# Patient Record
Sex: Female | Born: 1965
Health system: Southern US, Community
[De-identification: ages and names within clinical notes are randomized; demographics above are authoritative.]

## PROBLEM LIST (undated history)

## (undated) DIAGNOSIS — R911 Solitary pulmonary nodule: Secondary | ICD-10-CM

## (undated) DIAGNOSIS — J45909 Unspecified asthma, uncomplicated: Secondary | ICD-10-CM

## (undated) DIAGNOSIS — H269 Unspecified cataract: Secondary | ICD-10-CM

## (undated) DIAGNOSIS — J439 Emphysema, unspecified: Secondary | ICD-10-CM

## (undated) DIAGNOSIS — J449 Chronic obstructive pulmonary disease, unspecified: Secondary | ICD-10-CM

## (undated) DIAGNOSIS — J8410 Pulmonary fibrosis, unspecified: Secondary | ICD-10-CM

## (undated) DIAGNOSIS — M1990 Unspecified osteoarthritis, unspecified site: Secondary | ICD-10-CM

## (undated) DIAGNOSIS — T7840XA Allergy, unspecified, initial encounter: Secondary | ICD-10-CM

## (undated) DIAGNOSIS — J302 Other seasonal allergic rhinitis: Secondary | ICD-10-CM

## (undated) DIAGNOSIS — G43909 Migraine, unspecified, not intractable, without status migrainosus: Secondary | ICD-10-CM

## (undated) DIAGNOSIS — K219 Gastro-esophageal reflux disease without esophagitis: Secondary | ICD-10-CM

## (undated) DIAGNOSIS — R0600 Dyspnea, unspecified: Secondary | ICD-10-CM

## (undated) DIAGNOSIS — C801 Malignant (primary) neoplasm, unspecified: Secondary | ICD-10-CM

## (undated) DIAGNOSIS — C3490 Malignant neoplasm of unspecified part of unspecified bronchus or lung: Secondary | ICD-10-CM

## (undated) HISTORY — PX: TUBAL LIGATION: SHX77

## (undated) HISTORY — DX: Allergy, unspecified, initial encounter: T78.40XA

## (undated) HISTORY — DX: Gastro-esophageal reflux disease without esophagitis: K21.9

## (undated) HISTORY — DX: Malignant neoplasm of unspecified part of unspecified bronchus or lung: C34.90

## (undated) HISTORY — DX: Unspecified cataract: H26.9

## (undated) HISTORY — DX: Emphysema, unspecified: J43.9

## (undated) HISTORY — DX: Other seasonal allergic rhinitis: J30.2

## (undated) HISTORY — DX: Unspecified osteoarthritis, unspecified site: M19.90

## (undated) HISTORY — DX: Unspecified asthma, uncomplicated: J45.909

## (undated) HISTORY — DX: Migraine, unspecified, not intractable, without status migrainosus: G43.909

---

## 2004-03-29 ENCOUNTER — Emergency Department: Payer: Self-pay | Admitting: Emergency Medicine

## 2004-05-11 ENCOUNTER — Other Ambulatory Visit: Payer: Self-pay

## 2004-05-11 ENCOUNTER — Emergency Department: Payer: Self-pay | Admitting: Emergency Medicine

## 2004-09-19 ENCOUNTER — Emergency Department: Payer: Self-pay | Admitting: Emergency Medicine

## 2004-10-15 ENCOUNTER — Emergency Department: Payer: Self-pay | Admitting: Emergency Medicine

## 2005-12-23 ENCOUNTER — Emergency Department: Payer: Self-pay | Admitting: Emergency Medicine

## 2006-10-01 ENCOUNTER — Emergency Department: Payer: Self-pay | Admitting: Emergency Medicine

## 2010-07-01 ENCOUNTER — Emergency Department: Payer: Self-pay | Admitting: Unknown Physician Specialty

## 2011-12-17 ENCOUNTER — Ambulatory Visit: Payer: Self-pay

## 2012-01-06 ENCOUNTER — Emergency Department: Payer: Self-pay | Admitting: Emergency Medicine

## 2012-01-26 ENCOUNTER — Ambulatory Visit (INDEPENDENT_AMBULATORY_CARE_PROVIDER_SITE_OTHER): Payer: Self-pay | Admitting: Pulmonary Disease

## 2012-01-26 ENCOUNTER — Encounter: Payer: Self-pay | Admitting: Pulmonary Disease

## 2012-01-26 VITALS — BP 104/70 | HR 79 | Temp 98.0°F | Ht 61.0 in | Wt 107.4 lb

## 2012-01-26 DIAGNOSIS — J8410 Pulmonary fibrosis, unspecified: Secondary | ICD-10-CM

## 2012-01-26 DIAGNOSIS — R059 Cough, unspecified: Secondary | ICD-10-CM

## 2012-01-26 DIAGNOSIS — Z720 Tobacco use: Secondary | ICD-10-CM | POA: Insufficient documentation

## 2012-01-26 DIAGNOSIS — R911 Solitary pulmonary nodule: Secondary | ICD-10-CM | POA: Insufficient documentation

## 2012-01-26 DIAGNOSIS — F17200 Nicotine dependence, unspecified, uncomplicated: Secondary | ICD-10-CM

## 2012-01-26 DIAGNOSIS — J4489 Other specified chronic obstructive pulmonary disease: Secondary | ICD-10-CM | POA: Insufficient documentation

## 2012-01-26 DIAGNOSIS — J449 Chronic obstructive pulmonary disease, unspecified: Secondary | ICD-10-CM | POA: Insufficient documentation

## 2012-01-26 DIAGNOSIS — R05 Cough: Secondary | ICD-10-CM

## 2012-01-26 MED ORDER — TIOTROPIUM BROMIDE MONOHYDRATE 18 MCG IN CAPS: 18.0000 ug | ORAL_CAPSULE | Freq: Every day | RESPIRATORY_TRACT | Status: DC

## 2012-01-26 MED ORDER — CICLESONIDE 160 MCG/ACT IN AERS: 1.0000 | INHALATION_SPRAY | Freq: Two times a day (BID) | RESPIRATORY_TRACT | Status: DC

## 2012-01-26 MED ORDER — BECLOMETHASONE DIPROPIONATE 80 MCG/ACT IN AERS: 1.0000 | INHALATION_SPRAY | Freq: Two times a day (BID) | RESPIRATORY_TRACT | Status: DC

## 2012-01-26 NOTE — Progress Notes (Signed)
Subjective:    Patient ID: Joy Ball, female    DOB: 01/31/1966, 46 y.o.   MRN: [id number]  HPI  Joy Ball is a very pleasant 46 year old female with a past medical history significant for asthma who comes to our clinic today to establish care. She states that she was diagnosed with asthma at age 14 when she was pregnant. She developed pneumonia and was transferred to [facility] where she delivered her baby and then was referred back to pulmonary medicine in [place] for further management. She saw Dr. Priscille Heidelberg for several years until he left his practice. Since then she has not been using inhalers consistently and did not have a primary care doctor until a few months ago. She states that over the last several years she has become progressively short of breath with exertion. She is able to work in her garden and do regular chores around her house however with heavy labor or hot weather she gets short of breath. She does get short of breath when she climbs a flight of stairs. In June of 2013 she had to go to the emergency room for shortness of breath where she was given prednisone and inhalers. She then saw a new primary care physician who prescribed Advair and albuterol. She does not think that the Advair has helped much as she still remained short of breath. She denies chest pain with or shortness of breath but states that she does often have sputum production. She quit smoking over one week ago.  Past Medical History  Diagnosis Date  . Seasonal allergies   . Migraines   . Asthma   . GERD (gastroesophageal reflux disease)      Family History  Problem Relation Age of Onset  . Lung cancer Father     was a smoker  . Heart disease Maternal Grandfather   . Emphysema Mother      History   Social History  . Marital Status: Married    Spouse Name: N/A    Number of Children: 3  . Years of Education: N/A   Occupational History  . Cleans houses Other   Social History Main Topics  .  Smoking status: Former Smoker -- 2.0 packs/day for 30 years    Types: Cigarettes    Quit date: 01/19/2012  . Smokeless tobacco: Never Used  . Alcohol Use: Yes     occ  . Drug Use: No  . Sexually Active: Not on file   Other Topics Concern  . Not on file   Social History Narrative  . No narrative on file     Allergies  Allergen Reactions  . Amoxicillin Hives     No outpatient prescriptions prior to visit.     Review of Systems  Constitutional: Negative for fever, chills and unexpected weight change.  HENT: Negative for ear pain, nosebleeds, congestion, sore throat, rhinorrhea, sneezing, trouble swallowing, dental problem, voice change, postnasal drip and sinus pressure.   Eyes: Negative for visual disturbance.  Respiratory: Positive for cough. Negative for choking and shortness of breath.   Cardiovascular: Negative for chest pain and leg swelling.  Gastrointestinal: Negative for vomiting, abdominal pain and diarrhea.  Genitourinary: Negative for difficulty urinating.  Musculoskeletal: Negative for arthralgias.  Skin: Negative for rash.  Neurological: Negative for tremors, syncope and headaches.  Hematological: Does not bruise/bleed easily.       Objective:   Physical Exam Filed Vitals:   01/26/12 1610  BP: 104/70  Pulse: 79  Temp: 98  F (36.7 C)  TempSrc: Oral  Height: 5\' 1"  (1.549 m)  Weight: 107 lb 6.4 oz (48.716 kg)  SpO2: 99%    Gen: well appearing, no acute distress HEENT: NCAT, PERRL, EOMi, OP clear, neck supple without masses PULM: Poor air movement bilaterally CV: RRR, no mgr, no JVD AB: BS+, soft, nontender, no hsm Ext: warm, no edema, no clubbing, no cyanosis Derm: no rash or skin breakdown Neuro: A&Ox4, CN II-XII intact, strength 5/5 in all 4 extremities  June 2013 chest x-ray from [facility] shows upper lobe emphysema a very small subcentimeter nodule in the left upper lobe  August 2013 simple spirometry normal ratio and normal FEV1 however there  was delay in the expiratory phase of her flow volume loop      Assessment & Plan:   Asthma with COPD Mrs. Lincoln's smoking history and dyspnea on exertion seen consistent with COPD. She has upper lobe emphysema noted on her chest x-ray from June 2013 which would also follow at this diagnosis. However she also describes asthma-like symptoms so it is likely that she has a component of both emphysema and asthma. I am reassured that her simple spirometry is essentially normal in terms of her percent predicted FEV1, however she did have some delay in her expiratory flow loop consistent with small airways disease. The small airways disease is likely due to her tobacco use.  Because her simple spirometry is normal it does raise concern that the fibrosis noted on the chest x-ray is clinically significant.  Plan: -Encouraged greatly to stay off cigarettes. -Stop Advair as she has not had benefit from this -Start Spiriva daily -Start Qvar daily as this is a small particle inhale corticosteroid and perhaps will give her greater benefit than the large particle inhale corticosteroid in Advair  Pulmonary fibrosis The fibrosis noted in the upper lobes on the June 2013 chest x-ray was minimal. However considering her significant shortness of breath think it is reasonable to pursue a chest CT to further characterize this.  Plan: -Obtain CT scan without contrast at [facility] to evaluate pulmonary fibrosis -If significant pulmonary fibrosis noted on that study did obtain a further occupational exposure history and lab work as needed.  Pulmonary nodule, left It is reassuring that this nodule is less than 1 cm, round, and solid. However considering the fact she is a smoker she is at higher risk for primary lung malignancy.  Plan: -CT scan of chest without contrast to further evaluate this lesion.  Tobacco abuse Encouraged to stay off cigarettes at length.   Updated Medication List Outpatient Encounter  Prescriptions as of 01/26/2012  Medication Sig Dispense Refill  . albuterol (VENTOLIN HFA) 108 (90 BASE) MCG/ACT inhaler Inhale 2 puffs into the lungs every 6 (six) hours as needed.      . beclomethasone (QVAR) 80 MCG/ACT inhaler Inhale 1 puff into the lungs 2 (two) times daily.  1 Inhaler  2  . tiotropium (SPIRIVA HANDIHALER) 18 MCG inhalation capsule Place 1 capsule (18 mcg total) into inhaler and inhale daily.  30 capsule  2  . DISCONTD: ciclesonide (ALVESCO) 160 MCG/ACT inhaler Inhale 1 puff into the lungs 2 (two) times daily.  1 Inhaler  3  . DISCONTD: Fluticasone-Salmeterol (ADVAIR DISKUS) 250-50 MCG/DOSE AEPB Inhale 1 puff into the lungs every 12 (twelve) hours.

## 2012-01-26 NOTE — Assessment & Plan Note (Signed)
The fibrosis noted in the upper lobes on the June 2013 chest x-ray was minimal. However considering her significant shortness of breath think it is reasonable to pursue a chest CT to further characterize this.  Plan: -Obtain CT scan without contrast at [facility] to evaluate pulmonary fibrosis -If significant pulmonary fibrosis noted on that study did obtain a further occupational exposure history and lab work as needed.

## 2012-01-26 NOTE — Patient Instructions (Addendum)
We will send you for a CT scan of you lungs to evaluate the scarring and lung nodule.  We will call you with the results of the test when we see it. Stay off cigarettes! Stop advair. Keep taking albuterol as you are doing. Start Spiriva one puff daily. Start QVar 1 puff twice a day. We will see you back in 2-3 months or sooner if needed.

## 2012-01-26 NOTE — Assessment & Plan Note (Signed)
Encouraged to stay off cigarettes at length.

## 2012-01-26 NOTE — Assessment & Plan Note (Signed)
Joy Ball's smoking history and dyspnea on exertion seen consistent with COPD. She has upper lobe emphysema noted on her chest x-ray from June 2013 which would also follow at this diagnosis. However she also describes asthma-like symptoms so it is likely that she has a component of both emphysema and asthma. I am reassured that her simple spirometry is essentially normal in terms of her percent predicted FEV1, however she did have some delay in her expiratory flow loop consistent with small airways disease. The small airways disease is likely due to her tobacco use.  Because her simple spirometry is normal it does raise concern that the fibrosis noted on the chest x-ray is clinically significant.  Plan: -Encouraged greatly to stay off cigarettes. -Stop Advair as she has not had benefit from this -Start Spiriva daily -Start Qvar daily as this is a small particle inhale corticosteroid and perhaps will give her greater benefit than the large particle inhale corticosteroid in Advair

## 2012-01-26 NOTE — Assessment & Plan Note (Signed)
It is reassuring that this nodule is less than 1 cm, round, and solid. However considering the fact she is a smoker she is at higher risk for primary lung malignancy.  Plan: -CT scan of chest without contrast to further evaluate this lesion.

## 2012-01-29 ENCOUNTER — Ambulatory Visit: Payer: Self-pay | Admitting: Pulmonary Disease

## 2012-02-02 ENCOUNTER — Telehealth: Payer: Self-pay | Admitting: Pulmonary Disease

## 2012-02-02 DIAGNOSIS — R911 Solitary pulmonary nodule: Secondary | ICD-10-CM

## 2012-02-02 MED ORDER — FLUTICASONE-SALMETEROL 250-50 MCG/DOSE IN AEPB: 1.0000 | INHALATION_SPRAY | Freq: Two times a day (BID) | RESPIRATORY_TRACT | Status: DC

## 2012-02-02 NOTE — Telephone Encounter (Signed)
I called Joy Ball to let her know that she had pulmonary nodule and a lot of emphysema with some basilar scarring.  She will need a 3 month follow up CT.  She also asked that we switch her off the spiriva/QVar combo because it is too expensive.  Change to Advair, Rx sent by me.

## 2012-02-02 NOTE — Telephone Encounter (Signed)
Order placed. Jessilyn Catino, CMA  

## 2012-05-04 ENCOUNTER — Encounter: Payer: Self-pay | Admitting: Pulmonary Disease

## 2012-05-04 ENCOUNTER — Ambulatory Visit: Payer: Self-pay | Admitting: Pulmonary Disease

## 2012-05-04 ENCOUNTER — Ambulatory Visit (INDEPENDENT_AMBULATORY_CARE_PROVIDER_SITE_OTHER): Payer: Self-pay | Admitting: Pulmonary Disease

## 2012-05-04 VITALS — BP 100/60 | HR 80 | Temp 98.0°F | Ht 61.0 in | Wt 111.8 lb

## 2012-05-04 DIAGNOSIS — Z720 Tobacco use: Secondary | ICD-10-CM

## 2012-05-04 DIAGNOSIS — F17200 Nicotine dependence, unspecified, uncomplicated: Secondary | ICD-10-CM

## 2012-05-04 DIAGNOSIS — J4489 Other specified chronic obstructive pulmonary disease: Secondary | ICD-10-CM

## 2012-05-04 DIAGNOSIS — R1310 Dysphagia, unspecified: Secondary | ICD-10-CM

## 2012-05-04 DIAGNOSIS — R911 Solitary pulmonary nodule: Secondary | ICD-10-CM

## 2012-05-04 DIAGNOSIS — J8410 Pulmonary fibrosis, unspecified: Secondary | ICD-10-CM

## 2012-05-04 DIAGNOSIS — J449 Chronic obstructive pulmonary disease, unspecified: Secondary | ICD-10-CM

## 2012-05-04 NOTE — Assessment & Plan Note (Signed)
Based on the review of her CT chest, it is clear that she has significant emphysema due to her tobacco use.  Plan: -advised to get a flu shot locally (cheaper from [facility] for her for insurance reasons) -obtain full PFT's -continue spiriva -continue albuterol as needed -stop smoking!

## 2012-05-04 NOTE — Progress Notes (Signed)
Subjective:    Patient ID: Joy Ball, female    DOB: July 24, 1965, 46 y.o.   MRN: [id number]  Synopsis:  Joy Ball was referred to the [facility] Pulmonary clinic in [facility] in 01/2012 for a pulmonary nodule.  She has a > 25 pack year smoking history.  A CT scan in 01/2012 showed multiple sub centimeter nodules and one area of scarring in the right lung apex 1.5cmx1.7cm.  She also had significant emphysema in the upper lobes and GGO/micronodular scarring in the R lung base.  HPI  05/04/2012 Unfortunately Joy Ball is still smoking.  She quit smoking for several weeks since our last visit and her cough improved greatly but she has still been smoking more lately.  She liked the spiriva but stopped taking it due to cost.  She gets short of breath when she walks up stairs, but does OK on level ground.  She sometimes feels that food gets stuck in her esophagus (3-4 times a month). She does not have an unexplained rash, joint aches, or weight loss.  Past Medical History  Diagnosis Date  . Seasonal allergies   . Migraines   . Asthma   . GERD (gastroesophageal reflux disease)      Review of Systems  Constitutional: Negative for fever, chills, fatigue and unexpected weight change.  HENT: Positive for congestion, rhinorrhea and postnasal drip. Negative for nosebleeds.   Respiratory: Positive for cough and shortness of breath. Negative for chest tightness.        Objective:   Physical Exam  Filed Vitals:   05/04/12 1522  BP: 100/60  Pulse: 80  Temp: 98 F (36.7 C)  TempSrc: Oral  Height: 5\' 1"  (1.549 m)  Weight: 111 lb 12.8 oz (50.712 kg)  SpO2: 98%   Gen: well appearing, no acute distress HEENT: NCAT, PERRL, EOMi, OP clear, neck supple without masses PULM: Crackles R base CV: RRR, no mgr, no JVD AB: BS+, soft, nontender, no hsm Ext: warm, no edema, no clubbing, no cyanosis      Assessment & Plan:   Asthma with COPD Based on the review of her CT chest, it is clear that she has  significant emphysema due to her tobacco use.  Plan: -advised to get a flu shot locally (cheaper from [facility] for her for insurance reasons) -obtain full PFT's -continue spiriva -continue albuterol as needed -stop smoking!  Pulmonary fibrosis There is focal ground glass and micronodular opacity in the RLL on her CT scans.  This has not changed and really doesn't seem to limit her much from a functional standpoint.  She has little evidence on history or exam to suggest a connective tissue disease.  I worry about aspiration given the fact that food sometimes gets stuck in her esophagus and the opacities are located in the RLL.  Plan: - today -full PFT at [facility] -serologic work up at [facility] -barium swallow at [facility]  Pulmonary nodule, left Fortunately there is no change noted on Joy Ball's CT scan performed today.  She really needs to quit smoking.  Plan: -repeat CT in 6 months -quit smoking!  Tobacco abuse We discussed at length today.  Chantix gave her a headache in years prior.  She has not tried nicotine patches.  She will try this (she has at home). I advised to use the 7 mcg patches.   Updated Medication List Outpatient Encounter Prescriptions as of 05/04/2012  Medication Sig Dispense Refill  . albuterol (VENTOLIN HFA) 108 (90 BASE) MCG/ACT inhaler Inhale 2 puffs into  the lungs every 6 (six) hours as needed.      . [DISCONTINUED] Fluticasone-Salmeterol (ADVAIR DISKUS) 250-50 MCG/DOSE AEPB Inhale 1 puff into the lungs 2 (two) times daily.  1 each  5

## 2012-05-04 NOTE — Assessment & Plan Note (Signed)
We discussed at length today.  Chantix gave her a headache in years prior.  She has not tried nicotine patches.  She will try this (she has at home). I advised to use the 7 mcg patches.

## 2012-05-04 NOTE — Assessment & Plan Note (Signed)
There is focal ground glass and micronodular opacity in the RLL on her CT scans.  This has not changed and really doesn't seem to limit her much from a functional standpoint.  She has little evidence on history or exam to suggest a connective tissue disease.  I worry about aspiration given the fact that food sometimes gets stuck in her esophagus and the opacities are located in the RLL.  Plan: - today -full PFT at [facility] -serologic work up at [facility] -barium swallow at [facility]

## 2012-05-04 NOTE — Patient Instructions (Addendum)
We will send you for lab work, pulmonary function testing and a barium swallow at [facility].  Use the nicotine patches to quit smoking.    Keep using the spiriva  Get a flu shot from [facility] or a local pharmacy  We will see you back in December 2013

## 2012-05-04 NOTE — Assessment & Plan Note (Signed)
Fortunately there is no change noted on Joy Ball's CT scan performed today.  She really needs to quit smoking.  Plan: -repeat CT in 6 months -quit smoking!

## 2012-05-11 ENCOUNTER — Encounter: Payer: Self-pay | Admitting: Pulmonary Disease

## 2012-05-11 ENCOUNTER — Ambulatory Visit: Payer: Self-pay | Admitting: Pulmonary Disease

## 2012-05-11 LAB — PULMONARY FUNCTION TEST

## 2012-05-18 ENCOUNTER — Telehealth: Payer: Self-pay | Admitting: *Deleted

## 2012-05-18 ENCOUNTER — Encounter: Payer: Self-pay | Admitting: Pulmonary Disease

## 2012-05-18 NOTE — Telephone Encounter (Signed)
Spoke with pt and notified of results per Dr.McQuaid. Pt verbalized understanding and denied any questions. 

## 2012-05-18 NOTE — Telephone Encounter (Signed)
Message copied by Christen Butter on Tue May 18, 2012 10:20 AM ------      Message from: Max Fickle B      Created: Tue May 18, 2012 10:08 AM       L,            Please let her know that her barium swallow and all her labwork was normal            Thanks      B

## 2012-05-18 NOTE — Telephone Encounter (Signed)
Tried calling the pt- LMTCB. 

## 2012-06-01 ENCOUNTER — Ambulatory Visit: Payer: Self-pay | Admitting: Pulmonary Disease

## 2012-06-11 ENCOUNTER — Encounter: Payer: Self-pay | Admitting: Pulmonary Disease

## 2012-06-26 ENCOUNTER — Emergency Department: Payer: Self-pay | Admitting: Emergency Medicine

## 2013-04-01 ENCOUNTER — Ambulatory Visit: Payer: Self-pay | Admitting: Emergency Medicine

## 2013-04-28 ENCOUNTER — Other Ambulatory Visit: Payer: Self-pay

## 2013-06-27 ENCOUNTER — Ambulatory Visit: Payer: Self-pay | Admitting: Physician Assistant

## 2013-08-28 ENCOUNTER — Ambulatory Visit: Payer: Self-pay | Admitting: Physician Assistant

## 2013-09-05 ENCOUNTER — Ambulatory Visit: Payer: Self-pay | Admitting: Physician Assistant

## 2014-07-18 ENCOUNTER — Ambulatory Visit: Payer: Self-pay | Admitting: Gastroenterology

## 2014-07-27 ENCOUNTER — Encounter: Payer: Self-pay | Admitting: Pulmonary Disease

## 2014-07-27 ENCOUNTER — Ambulatory Visit: Payer: Self-pay | Admitting: Gastroenterology

## 2014-07-27 ENCOUNTER — Ambulatory Visit (INDEPENDENT_AMBULATORY_CARE_PROVIDER_SITE_OTHER): Payer: 59 | Admitting: Pulmonary Disease

## 2014-07-27 VITALS — BP 112/70 | HR 91 | Ht 61.0 in | Wt 90.8 lb

## 2014-07-27 DIAGNOSIS — R911 Solitary pulmonary nodule: Secondary | ICD-10-CM

## 2014-07-27 DIAGNOSIS — Z720 Tobacco use: Secondary | ICD-10-CM

## 2014-07-27 DIAGNOSIS — J449 Chronic obstructive pulmonary disease, unspecified: Secondary | ICD-10-CM

## 2014-07-27 DIAGNOSIS — J8410 Pulmonary fibrosis, unspecified: Secondary | ICD-10-CM

## 2014-07-27 MED ORDER — BENZONATATE 200 MG PO CAPS: 200.0000 mg | ORAL_CAPSULE | Freq: Three times a day (TID) | ORAL | Status: DC | PRN

## 2014-07-27 MED ORDER — FLUTICASONE-SALMETEROL 250-50 MCG/DOSE IN AEPB: 1.0000 | INHALATION_SPRAY | Freq: Two times a day (BID) | RESPIRATORY_TRACT | Status: AC

## 2014-07-27 NOTE — Assessment & Plan Note (Signed)
We will repeat a CT chest to evaluate the pulmonary nodule as well as the scarring in the right upper lung. Unfortunately she lost her insurance and she did not undergo any of the standard surveillance images in the last 2 years.

## 2014-07-27 NOTE — Assessment & Plan Note (Addendum)
I am very worried about Joy Ball. She continues to smoke 1-1/2 packs of cigarettes daily and remain symptomatic from her COPD with asthmatic features. Specifically, the main reason why I'm concerned about her is that her mother has severe emphysema and pulmonary fibrosis and will likely die in the next 6 months from a similar syndrome. I worry that Joy Ball is also developing early changes of fibrosis in addition to her already severe emphysema.  Fortunately today she did not have hypoxemia on exertion.  Plan: -Check alpha-1 antitrypsin level next visit -Resume Advair twice a day -Obtain full pulmonary function testing -See below -Educated at length to quit

## 2014-07-27 NOTE — Assessment & Plan Note (Signed)
Educated at length to quit. 

## 2014-07-27 NOTE — Progress Notes (Signed)
Subjective:    Patient ID: Joy Ball, female    DOB: March 21, 1966, 49 y.o.   MRN: [id number]  Synopsis:  Joy Ball was referred to the [facility] Pulmonary clinic in [facility] in 01/2012 for a pulmonary nodule.  She has a > 25 pack year smoking history.  A CT scan in 01/2012 showed multiple sub centimeter nodules and one area of scarring in the right lung apex 1.5cmx1.7cm.  She also had significant emphysema in the upper lobes and GGO/micronodular scarring in the R lung base.  HPI  Chief Complaint  Patient presents with  . Follow-up    pt c/o cough with clear mucus, chest tightness, sob with exhertion. Pt denies wheezing. Pt had colonoscopy today for gi related issues.   Joy Ball lost her insurance and hasn't seen me since November 2013. She has been having a lot of nausea, diarrhea and fatigue. She had an endoscopy this morning (colonoscopy and endoscopy) and apparently they found gastritis.  She continues to smoke.  She says that she still has shortness of breaht on exertion.  She coughs a lot but it is mostly dry.  She Activities like running a vacuum clearner, climbing stairs, and carrying in groceries makes her more short of breath.  She says that it starts with a tickling and then leads to coughing.  She is only using Albuterol now.  Past Medical History  Diagnosis Date  . Seasonal allergies   . Migraines   . Asthma   . GERD (gastroesophageal reflux disease)      Review of Systems  Constitutional: Negative for fever, chills, fatigue and unexpected weight change.  HENT: Negative for congestion, nosebleeds, postnasal drip and rhinorrhea.   Respiratory: Positive for cough and shortness of breath. Negative for chest tightness.        Objective:   Physical Exam  Filed Vitals:   07/27/14 1556  BP: 112/70  Pulse: 91  Height: 5' 1"  (1.549 m)  Weight: 90 lb 12.8 oz (41.187 kg)  SpO2: 94%  RA  Angulated 500 feet today in clinic in her O2 saturation remained between 98 and 100% on  room air  Gen: well appearing, no acute distress HEENT: NCAT, EOMi, OP clear,  PULM: CTA B CV: RRR, no mgr, no JVD AB: BS+, soft, nontender,  Ext: warm, no edema, no clubbing, no cyanosis Neuro: A&Ox4, maew  Upper lobe pulmonary fibrosis noted on a chest x-ray from June 2013. 04/2012 CT chest (My read) >> predominantly upper lobe centrilobular emphysema bilaterally; scarring/nodule in the RUL is unchanged (1.5x1.7cm); ground glass opacities in the periphery and base of the RLL is noted, some intralobular septal thickening in the base on the R 05/04/2012 6 MW >> 1500 feet, HR 99 peak, O2 saturation 97% peak 04/2012 Full PFT >> Ratio 69%, FEV1 2.02 L (85%), FVC 2.94, TLC 3.4 L (77%pred), RV 0.4L (22% pred), DLCO 9.8 mL/mmHg/min (54% pred) 05/11/2012 Barium swallow > unremarkable; minimal gastroesophageal reflux into her distal esophagus only 04/2012 [aldolase, anti-centromere, ANA, RF, CRP, ESR, Sjogren's, and Anti-Jo-1] >> ALL NEGATIVE     Assessment & Plan:   Asthma with COPD I am very worried about Joy Ball. She continues to smoke 1-1/2 packs of cigarettes daily and remain symptomatic from her COPD with asthmatic features. Specifically, the main reason why I'm concerned about her is that her mother has severe emphysema and pulmonary fibrosis and will likely die in the next 6 months from a similar syndrome. I worry that Joy Ball is also developing early  changes of fibrosis in addition to her already severe emphysema.  Fortunately today she did not have hypoxemia on exertion.  Plan: -Check alpha-1 antitrypsin level next visit -Resume Advair twice a day -Obtain full pulmonary function testing -See below -Educated at length to quit   Pulmonary fibrosis As noted previously, there was some mild intralobular septal thickening in the base on the right lower lobe on a 2013 CT chest. I remain concerned that she may have an overlap syndrome of asthma, COPD as well as pulmonary fibrosis.  Considering her ongoing smoking this Cooper 10 and a very poor prognosis. I explained this to her at length today in clinic. Lab testing in 2013 did not show evidence of a connective tissue disease. As noted above, I worry that this is a similar syndrome to her mother who is currently dying from a very severe overlap of emphysema and pulmonary fibrosis.  Plan:  -educated at length to quit smoking -Repeat high-resolution CT chest considering progression of shortness of breath   Tobacco abuse Educated at length to quit.   Pulmonary nodule, left We will repeat a CT chest to evaluate the pulmonary nodule as well as the scarring in the right upper lung. Unfortunately she lost her insurance and she did not undergo any of the standard surveillance images in the last 2 years.     Updated Medication List Outpatient Encounter Prescriptions as of 07/27/2014  Medication Sig  . albuterol (VENTOLIN HFA) 108 (90 BASE) MCG/ACT inhaler Inhale 2 puffs into the lungs every 6 (six) hours as needed.  . benzonatate (TESSALON) 200 MG capsule Take 1 capsule (200 mg total) by mouth 3 (three) times daily as needed for cough.  . Fluticasone-Salmeterol (ADVAIR DISKUS) 250-50 MCG/DOSE AEPB Inhale 1 puff into the lungs 2 (two) times daily.  Marland Kitchen ibuprofen (ADVIL,MOTRIN) 800 MG tablet Take 800 mg by mouth as needed.  Marland Kitchen omeprazole (PRILOSEC) 20 MG capsule Take 20 mg by mouth daily.  . ondansetron (ZOFRAN) 4 MG tablet Take 4 mg by mouth 3 (three) times daily as needed.

## 2014-07-27 NOTE — Assessment & Plan Note (Signed)
As noted previously, there was some mild intralobular septal thickening in the base on the right lower lobe on a 2013 CT chest. I remain concerned that she may have an overlap syndrome of asthma, COPD as well as pulmonary fibrosis. Considering her ongoing smoking this Cooper 10 and a very poor prognosis. I explained this to her at length today in clinic. Lab testing in 2013 did not show evidence of a connective tissue disease. As noted above, I worry that this is a similar syndrome to her mother who is currently dying from a very severe overlap of emphysema and pulmonary fibrosis.  Plan:  -educated at length to quit smoking -Repeat high-resolution CT chest considering progression of shortness of breath

## 2014-07-27 NOTE — Patient Instructions (Signed)
We will set up a PFT and a CT scan Take the Advair twice a day no matter how you feel Quit smoking We will see you back in 2 months or sooner if needed

## 2014-09-25 ENCOUNTER — Ambulatory Visit
Admit: 2014-09-25 | Disposition: A | Discharge status: Home / Self Care | Payer: Self-pay | Attending: Family Medicine | Admitting: Family Medicine

## 2015-07-11 ENCOUNTER — Emergency Department
Admission: EM | Admit: 2015-07-11 | Discharge: 2015-07-12 | Discharge status: Against Medical Advice | Payer: Self-pay | Attending: Emergency Medicine | Admitting: Emergency Medicine

## 2015-07-11 ENCOUNTER — Encounter: Payer: Self-pay | Admitting: Emergency Medicine

## 2015-07-11 DIAGNOSIS — R110 Nausea: Secondary | ICD-10-CM | POA: Insufficient documentation

## 2015-07-11 DIAGNOSIS — R1013 Epigastric pain: Secondary | ICD-10-CM | POA: Insufficient documentation

## 2015-07-11 DIAGNOSIS — M549 Dorsalgia, unspecified: Secondary | ICD-10-CM | POA: Insufficient documentation

## 2015-07-11 DIAGNOSIS — F17210 Nicotine dependence, cigarettes, uncomplicated: Secondary | ICD-10-CM | POA: Insufficient documentation

## 2015-07-11 HISTORY — DX: Chronic obstructive pulmonary disease, unspecified: J44.9

## 2015-07-11 HISTORY — DX: Emphysema, unspecified: J43.9

## 2015-07-11 LAB — COMPREHENSIVE METABOLIC PANEL
ALBUMIN: 4.1 g/dL (ref 3.5–5.0)
ALT: 14 U/L (ref 14–54)
AST: 29 U/L (ref 15–41)
Alkaline Phosphatase: 98 U/L (ref 38–126)
Anion gap: 9 (ref 5–15)
BILIRUBIN TOTAL: 0.5 mg/dL (ref 0.3–1.2)
BUN: 10 mg/dL (ref 6–20)
CO2: 25 mmol/L (ref 22–32)
CREATININE: 0.58 mg/dL (ref 0.44–1.00)
Calcium: 9.1 mg/dL (ref 8.9–10.3)
Chloride: 105 mmol/L (ref 101–111)
GFR calc Af Amer: >60 mL/min (ref >60)
GLUCOSE: 99 mg/dL (ref 65–99)
POTASSIUM: 3.6 mmol/L (ref 3.5–5.1)
Sodium: 139 mmol/L (ref 135–145)
TOTAL PROTEIN: 6.9 g/dL (ref 6.5–8.1)

## 2015-07-11 LAB — CBC WITH DIFFERENTIAL/PLATELET
BASOS ABS: 0.1 10*3/uL (ref 0–0.1)
Basophils Relative: 1 %
EOS ABS: 0.3 10*3/uL (ref 0–0.7)
EOS PCT: 3 %
HCT: 43.1 % (ref 35.0–47.0)
Hemoglobin: 14.4 g/dL (ref 12.0–16.0)
LYMPHS ABS: 2.9 10*3/uL (ref 1.0–3.6)
LYMPHS PCT: 23 %
MCH: 34.6 pg — AB (ref 26.0–34.0)
MCHC: 33.4 g/dL (ref 32.0–36.0)
MCV: 103.7 fL — AB (ref 80.0–100.0)
MONO ABS: 0.6 10*3/uL (ref 0.2–0.9)
Monocytes Relative: 5 %
Neutro Abs: 8.6 10*3/uL — ABNORMAL HIGH (ref 1.4–6.5)
Neutrophils Relative %: 68 %
PLATELETS: 398 10*3/uL (ref 150–440)
RBC: 4.16 MIL/uL (ref 3.80–5.20)
RDW: 12.9 % (ref 11.5–14.5)
WBC: 12.5 10*3/uL — AB (ref 3.6–11.0)

## 2015-07-11 LAB — URINALYSIS COMPLETE WITH MICROSCOPIC (ARMC ONLY)
BILIRUBIN URINE: NEGATIVE
GLUCOSE, UA: NEGATIVE mg/dL
KETONES UR: NEGATIVE mg/dL
Leukocytes, UA: NEGATIVE
Nitrite: NEGATIVE
Protein, ur: NEGATIVE mg/dL
Specific Gravity, Urine: 1.012 (ref 1.005–1.030)
pH: 6 (ref 5.0–8.0)

## 2015-07-11 LAB — LIPASE, BLOOD: Lipase: 22 U/L (ref 11–51)

## 2015-07-11 NOTE — ED Notes (Signed)
Pt presents to ED via [facility] with c/o epigastric abd pain that radiates around to her back. Pt reports pain feels sharp and cramping. Denies similar symptoms previously. Nauseated; denies vomiting. Pt attempted to eat in hopes it would help her feel better with no relief. Pt appears uncomfortable but has no increased work of breathing or distress noted. Skin warm and dry.

## 2015-07-13 ENCOUNTER — Other Ambulatory Visit: Payer: Self-pay | Admitting: Family Medicine

## 2015-07-13 ENCOUNTER — Ambulatory Visit (INDEPENDENT_AMBULATORY_CARE_PROVIDER_SITE_OTHER): Payer: BLUE CROSS/BLUE SHIELD | Admitting: Family Medicine

## 2015-07-13 ENCOUNTER — Encounter: Payer: Self-pay | Admitting: Family Medicine

## 2015-07-13 VITALS — BP 108/75 | HR 101 | Temp 98.3°F | Resp 16 | Ht 61.0 in | Wt 90.2 lb

## 2015-07-13 DIAGNOSIS — K219 Gastro-esophageal reflux disease without esophagitis: Secondary | ICD-10-CM | POA: Diagnosis not present

## 2015-07-13 DIAGNOSIS — R1011 Right upper quadrant pain: Secondary | ICD-10-CM

## 2015-07-13 DIAGNOSIS — D7589 Other specified diseases of blood and blood-forming organs: Secondary | ICD-10-CM | POA: Diagnosis not present

## 2015-07-13 LAB — VITAMIN B12: VITAMIN B 12: 258 pg/mL (ref 180–914)

## 2015-07-13 LAB — AMYLASE: AMYLASE: 90 U/L (ref 28–100)

## 2015-07-13 LAB — FOLATE: Folate: 11.2 ng/mL

## 2015-07-13 MED ORDER — RANITIDINE HCL 150 MG PO CAPS: 150.0000 mg | ORAL_CAPSULE | Freq: Two times a day (BID) | ORAL | Status: DC

## 2015-07-13 NOTE — Progress Notes (Signed)
Subjective:    Patient ID: Joy Ball, female    DOB: 1966/01/03, 50 y.o.   MRN: [id number]  HPI: Joy Ball is a 50 y.o. female presenting on 07/13/2015 for Abdominal Pain   HPI  Pt presents for abdominal pain- present x1 week. Feels very bloated with acid reflux. Micah Flesher to ER on 1/18 and left without being seen after pain worsened. She was nauseated in ER, denies now. Is able to drink. Denies dysuria, low back pain, flank pain. Abdominal pain was bad for 6 hours and then eased off. Pt now described as dull ache with gas and bloating.  Pain is located in the epigastric area and RUQ. Having diarrhea- 2-3 times per day. This is a longstanding issue-worked up by GI last year. Diarrhea is mostly in the morning.  Taking omeprazole once daily.  Pt has history of heavy alcohol use. Quit liquor last January but drinks beer daily.   Past Medical History  Diagnosis Date  . Seasonal allergies   . Migraines   . Asthma   . GERD (gastroesophageal reflux disease)   . COPD (chronic obstructive pulmonary disease) (HCC)   . Emphysema lung (HCC)     Current Outpatient Prescriptions on File Prior to Visit  Medication Sig  . albuterol (VENTOLIN HFA) 108 (90 BASE) MCG/ACT inhaler Inhale 2 puffs into the lungs every 6 (six) hours as needed.  . benzonatate (TESSALON) 200 MG capsule Take 1 capsule (200 mg total) by mouth 3 (three) times daily as needed for cough.  . Fluticasone-Salmeterol (ADVAIR DISKUS) 250-50 MCG/DOSE AEPB Inhale 1 puff into the lungs 2 (two) times daily.  Marland Kitchen ibuprofen (ADVIL,MOTRIN) 800 MG tablet Take 800 mg by mouth as needed.  Marland Kitchen omeprazole (PRILOSEC) 20 MG capsule Take 20 mg by mouth daily.  . ondansetron (ZOFRAN) 4 MG tablet Take 4 mg by mouth 3 (three) times daily as needed.   No current facility-administered medications on file prior to visit.    Review of Systems  Constitutional: Negative for fever and chills.  HENT: Negative.   Respiratory: Negative for cough, chest  tightness and wheezing.   Cardiovascular: Negative for chest pain and leg swelling.  Gastrointestinal: Positive for nausea, abdominal pain and diarrhea. Negative for vomiting, constipation, blood in stool and anal bleeding.  Endocrine: Negative.  Negative for cold intolerance, heat intolerance, polydipsia, polyphagia and polyuria.  Genitourinary: Negative for dysuria and difficulty urinating.  Musculoskeletal: Negative.   Neurological: Negative for dizziness, light-headedness and numbness.  Psychiatric/Behavioral: Negative.    Per HPI unless specifically indicated above     Objective:    BP 108/75 mmHg  Pulse 101  Temp(Src) 98.3 F (36.8 C) (Oral)  Resp 16  Ht  (1.549 m)  Wt 90 lb 3.2 oz (40.914 kg)  BMI 17.05 kg/m2  Wt Readings from Last 3 Encounters:  07/13/15 90 lb 3.2 oz (40.914 kg)  07/11/15 89 lb (40.37 kg)  07/27/14 90 lb 12.8 oz (41.187 kg)    Physical Exam  Constitutional: She is oriented to person, place, and time. She appears well-developed and well-nourished.  HENT:  Head: Normocephalic and atraumatic.  Neck: Neck supple.  Cardiovascular: Normal rate, regular rhythm and normal heart sounds.  Exam reveals no gallop and no friction rub.   No murmur heard. Pulmonary/Chest: Effort normal. She has wheezes in the right middle field and the left middle field. She exhibits no tenderness.  Abdominal: Soft. Normal appearance. She exhibits no distension, no pulsatile liver, no abdominal  bruit, no ascites and no mass. Bowel sounds are increased. There is no hepatosplenomegaly. There is tenderness in the right upper quadrant and epigastric area. There is positive Murphy's sign. There is no rigidity, no rebound, no guarding and no CVA tenderness.  Musculoskeletal: Normal range of motion. She exhibits no edema or tenderness.  Lymphadenopathy:    She has no cervical adenopathy.  Neurological: She is alert and oriented to person, place, and time.  Skin: Skin is warm and dry.     Results for orders placed or performed during the hospital encounter of 07/11/15  Lipase, blood  Result Value Ref Range   Lipase 22 11 - 51 U/L  Comprehensive metabolic panel  Result Value Ref Range   Sodium 139 135 - 145 mmol/L   Potassium 3.6 3.5 - 5.1 mmol/L   Chloride 105 101 - 111 mmol/L   CO2 25 22 - 32 mmol/L   Glucose, Bld 99 65 - 99 mg/dL   BUN 10 6 - 20 mg/dL   Creatinine, Ser 1.61 0.44 - 1.00 mg/dL   Calcium 9.1 8.9 - 09.6 mg/dL   Total Protein 6.9 6.5 - 8.1 g/dL   Albumin 4.1 3.5 - 5.0 g/dL   AST 29 15 - 41 U/L   ALT 14 14 - 54 U/L   Alkaline Phosphatase 98 38 - 126 U/L   Total Bilirubin 0.5 0.3 - 1.2 mg/dL   GFR calc non Af Amer >60 >60 mL/min   GFR calc Af Amer >60 >60 mL/min   Anion gap 9 5 - 15  CBC WITH DIFFERENTIAL  Result Value Ref Range   WBC 12.5 (H) 3.6 - 11.0 K/uL   RBC 4.16 3.80 - 5.20 MIL/uL   Hemoglobin 14.4 12.0 - 16.0 g/dL   HCT 04.5 40.9 - 81.1 %   MCV 103.7 (H) 80.0 - 100.0 fL   MCH 34.6 (H) 26.0 - 34.0 pg   MCHC 33.4 32.0 - 36.0 g/dL   RDW 91.4 78.2 - 95.6 %   Platelets 398 150 - 440 K/uL   Neutrophils Relative % 68 %   Neutro Abs 8.6 (H) 1.4 - 6.5 K/uL   Lymphocytes Relative 23 %   Lymphs Abs 2.9 1.0 - 3.6 K/uL   Monocytes Relative 5 %   Monocytes Absolute 0.6 0.2 - 0.9 K/uL   Eosinophils Relative 3 %   Eosinophils Absolute 0.3 0 - 0.7 K/uL   Basophils Relative 1 %   Basophils Absolute 0.1 0 - 0.1 K/uL  Urinalysis complete, with microscopic ([facility] only)  Result Value Ref Range   Color, Urine YELLOW (A) YELLOW   APPearance CLEAR (A) CLEAR   Glucose, UA NEGATIVE NEGATIVE mg/dL   Bilirubin Urine NEGATIVE NEGATIVE   Ketones, ur NEGATIVE NEGATIVE mg/dL   Specific Gravity, Urine 1.012 1.005 - 1.030   Hgb urine dipstick 1+ (A) NEGATIVE   pH 6.0 5.0 - 8.0   Protein, ur NEGATIVE NEGATIVE mg/dL   Nitrite NEGATIVE NEGATIVE   Leukocytes, UA NEGATIVE NEGATIVE   RBC / HPF 0-5 0 - 5 RBC/hpf   WBC, UA 0-5 0 - 5 WBC/hpf   Bacteria, UA RARE  (A) NONE SEEN   Squamous Epithelial / LPF 0-5 (A) NONE SEEN   Mucous PRESENT       Assessment & Plan:   Problem List Items Addressed This Visit      Digestive   GERD (gastroesophageal reflux disease)     Continue PPI for reflux symptoms. Add H2 blocker. Consider  having pt schedule appt with GI again if symptoms are not improving.        Relevant Medications   ranitidine (ZANTAC) 150 MG capsule    Other Visit Diagnoses    Macrocytosis    -  Primary    Add on B12 and folate. Consider alcohol as source given her history. Replete folate and B12 as needed.     Relevant Orders    Vitamin B12    Folate    RUQ abdominal pain        RUQ [place] to r/o gall bladder disease. Low suspicion for pancreatits given normal lipase but consider given ETOH use- add on amylase. Alarm symptoms reviewed. RTC 2 weeks for recheck.     Relevant Medications    ranitidine (ZANTAC) 150 MG capsule    Other Relevant Orders    Amylase    US Abdomen Limited RUQ       Meds ordered this encounter  Medications  . ranitidine (ZANTAC) 150 MG capsule    Sig: Take 1 capsule (150 mg total) by mouth 2 (two) times daily.    Dispense:  60 capsule    Refill:  11    Order Specific Question:  Supervising Provider    Answer:  Janeann Forehand [phone]      Follow up plan: Return in about 2 weeks (around 07/27/2015) for abominal pain. Marland Kitchen

## 2015-07-13 NOTE — Addendum Note (Signed)
Addended by: Torion Hulgan L on: 07/13/2015 09:01 AM   Modules accepted: Level of Service

## 2015-07-13 NOTE — Assessment & Plan Note (Signed)
  Continue PPI for reflux symptoms. Add H2 blocker. Consider having pt schedule appt with GI again if symptoms are not improving.

## 2015-07-13 NOTE — Patient Instructions (Signed)
We will get an ultrasound of you abdomen to determine the cause of you symptoms. Try taking Ranitidine twice daily in addition to your omeprazole to help with reflux. You can take Gas-x or Pepto for bloating and belching.   If you have severe abdominal pain, blood in the stools, severe nausea or vomiting- go to the ER.

## 2015-07-18 ENCOUNTER — Ambulatory Visit
Admission: RE | Admit: 2015-07-18 | Discharge: 2015-07-18 | Disposition: A | Discharge status: Home / Self Care | Payer: BLUE CROSS/BLUE SHIELD | Source: Ambulatory Visit | Attending: Family Medicine | Admitting: Family Medicine

## 2015-07-18 DIAGNOSIS — R1011 Right upper quadrant pain: Secondary | ICD-10-CM | POA: Diagnosis not present

## 2015-07-27 ENCOUNTER — Telehealth: Payer: Self-pay | Admitting: Family Medicine

## 2015-07-27 ENCOUNTER — Ambulatory Visit (INDEPENDENT_AMBULATORY_CARE_PROVIDER_SITE_OTHER): Payer: BLUE CROSS/BLUE SHIELD | Admitting: Family Medicine

## 2015-07-27 ENCOUNTER — Encounter: Payer: Self-pay | Admitting: Family Medicine

## 2015-07-27 VITALS — BP 108/78 | HR 97 | Temp 98.1°F | Resp 16 | Ht 61.0 in | Wt 91.0 lb

## 2015-07-27 DIAGNOSIS — L309 Dermatitis, unspecified: Secondary | ICD-10-CM | POA: Insufficient documentation

## 2015-07-27 DIAGNOSIS — K591 Functional diarrhea: Secondary | ICD-10-CM

## 2015-07-27 DIAGNOSIS — R1013 Epigastric pain: Secondary | ICD-10-CM | POA: Diagnosis not present

## 2015-07-27 DIAGNOSIS — Z23 Encounter for immunization: Secondary | ICD-10-CM | POA: Diagnosis not present

## 2015-07-27 DIAGNOSIS — M549 Dorsalgia, unspecified: Secondary | ICD-10-CM | POA: Diagnosis not present

## 2015-07-27 LAB — POCT URINALYSIS DIPSTICK
Bilirubin, UA: NEGATIVE
Glucose, UA: NEGATIVE
Ketones, UA: NEGATIVE
Leukocytes, UA: NEGATIVE
NITRITE UA: NEGATIVE
PROTEIN UA: NEGATIVE
RBC UA: NEGATIVE
SPEC GRAV UA: 1.015
UROBILINOGEN UA: NEGATIVE
pH, UA: 5

## 2015-07-27 MED ORDER — ONDANSETRON HCL 4 MG PO TABS: 4.0000 mg | ORAL_TABLET | Freq: Three times a day (TID) | ORAL | Status: DC | PRN

## 2015-07-27 MED ORDER — LOPERAMIDE HCL 2 MG PO CAPS: 2.0000 mg | ORAL_CAPSULE | ORAL | Status: DC | PRN

## 2015-07-27 MED ORDER — CIPROFLOXACIN HCL 250 MG PO TABS: 250.0000 mg | ORAL_TABLET | Freq: Two times a day (BID) | ORAL | Status: AC

## 2015-07-27 NOTE — Telephone Encounter (Signed)
Per Janice Coffin- pt requesting Zofran. Reordered and sent to pharmacy.

## 2015-07-27 NOTE — Telephone Encounter (Signed)
Pt called and ask  Zofran do you want to send this Rx I told her that we will confirm this with Amy and possibly send to pharmacy and if not then we will call her back ?

## 2015-07-27 NOTE — Assessment & Plan Note (Signed)
Gallblader disease r/o via [place]. Amylase and lipase have been normal- low suspicion for pancreatitis but given drinking it is a possibility.  Pt also has history of ulcer. Refer to GI for evaluation and possible endoscopy.  Continue omeprazole and zantac.

## 2015-07-27 NOTE — Progress Notes (Signed)
Subjective:    Patient ID: Joy Ball, female    DOB: May 30, 1966, 50 y.o.   MRN: [id number]  HPI: Joy Ball is a 50 y.o. female presenting on 07/27/2015 for Abdominal Pain   HPI   Follow up for abdominal pain. It is unchanged. Taking zofran twice per day for ongoing nausea. No vomiting. Diarrhea 4-5 times/day. Only drinks water at home during day. 4-5 beer every few nights. Stopped drinking liquor due to stomach pain. Pain is in RUQ, but not always in the same spot, describes at crampy and gnawing. Also complains of mid-back dull achey pain that comes with stomach pain. Worse after eating. Has not been eating as much. Has not lost weight. No appetite. Eats a lot of fried chicken. Also has acid reflux after eating. Takes prilosec and ranitidine, which help but it is worse at night. Was told last year by Dr. Servando Snare she had a gastric ulcer. She did not have follow-up at that time. No urinary symptoms.   Has also had occassional head and scalp sweats. No body sweats. It is worse with salt, but not always associated. LMP: 41. Happens randomly. No history of thyroid problems. No hot/cold intolerances.   Past Medical History  Diagnosis Date  . Seasonal allergies   . Migraines   . Asthma   . GERD (gastroesophageal reflux disease)   . COPD (chronic obstructive pulmonary disease) (HCC)   . Emphysema lung [facility])     Current Outpatient Prescriptions on File Prior to Visit  Medication Sig  . Fluticasone-Salmeterol (ADVAIR DISKUS) 250-50 MCG/DOSE AEPB Inhale 1 puff into the lungs 2 (two) times daily.  Marland Kitchen ibuprofen (ADVIL,MOTRIN) 800 MG tablet Take 800 mg by mouth as needed.  Marland Kitchen omeprazole (PRILOSEC) 20 MG capsule Take 20 mg by mouth daily.  . ondansetron (ZOFRAN) 4 MG tablet Take 4 mg by mouth 3 (three) times daily as needed.  . ranitidine (ZANTAC) 150 MG capsule Take 1 capsule (150 mg total) by mouth 2 (two) times daily.  . VENTOLIN HFA 108 (90 Base) MCG/ACT inhaler INHALE 2 PUFFS FOUR TIMES  DAILY AS NEEDED   No current facility-administered medications on file prior to visit.    Review of Systems  Constitutional: Positive for appetite change (no appetite). Negative for fever, chills, activity change and unexpected weight change.  HENT: Negative.   Respiratory: Negative for cough, chest tightness and wheezing.   Cardiovascular: Negative for chest pain and leg swelling.  Gastrointestinal: Positive for nausea, abdominal pain and diarrhea. Negative for vomiting, constipation, blood in stool and anal bleeding.  Endocrine: Negative.  Negative for cold intolerance, heat intolerance, polydipsia, polyphagia and polyuria.  Genitourinary: Positive for flank pain (right). Negative for dysuria, urgency, frequency, hematuria, difficulty urinating and pelvic pain.  Musculoskeletal: Negative for back pain.  Neurological: Negative for dizziness, light-headedness and numbness.  Psychiatric/Behavioral: Negative.    Per HPI unless specifically indicated above     Objective:    BP 108/78 mmHg  Pulse 97  Temp(Src) 98.1 F (36.7 C) (Oral)  Resp 16  Ht  (1.549 m)  Wt 91 lb (41.277 kg)  BMI 17.20 kg/m2  Wt Readings from Last 3 Encounters:  07/27/15 91 lb (41.277 kg)  07/13/15 90 lb 3.2 oz (40.914 kg)  07/11/15 89 lb (40.37 kg)    Physical Exam  Constitutional: She is oriented to person, place, and time. She appears well-developed.  HENT:  Head: Normocephalic.  Cardiovascular: Normal rate and regular rhythm.   Pulmonary/Chest: Effort  normal. No respiratory distress. She has no wheezes.  Abdominal: Soft. Normal appearance and bowel sounds are normal. She exhibits no distension and no mass. There is no hepatosplenomegaly, splenomegaly or hepatomegaly. There is tenderness (along spleen and liver margins) in the right upper quadrant and left upper quadrant. There is CVA tenderness (right side) and positive Murphy's sign. There is no rigidity and no guarding. Rebound: Right side.    LUQ/RUQ tenderness.  Musculoskeletal: Normal range of motion.  Neurological: She is alert and oriented to person, place, and time.  Skin: Skin is warm and dry.  Psychiatric: She has a normal mood and affect. Her behavior is normal.   Results for orders placed or performed in visit on 07/27/15  POCT urinalysis dipstick  Result Value Ref Range   Color, UA yellow    Clarity, UA clear    Glucose, UA negative    Bilirubin, UA negative    Ketones, UA negative    Spec Grav, UA 1.015    Blood, UA negative    pH, UA 5.0    Protein, UA negative    Urobilinogen, UA negative    Nitrite, UA negative    Leukocytes, UA Negative Negative      Assessment & Plan:   Problem List Items Addressed This Visit      Digestive   Functional diarrhea    >1 year. Was evaluated by GI last year but patient lost to follow-up. Could be alcohol related.  PRN immodium for symptoms. Previous stool studies have been negative.       Relevant Medications   loperamide (IMODIUM) 2 MG capsule     Other   Epigastric pain - Primary    Gallblader disease r/o via [place]. Amylase and lipase have been normal- low suspicion for pancreatitis but given drinking it is a possibility.  Pt also has history of ulcer. Refer to GI for evaluation and possible endoscopy.  Continue omeprazole and zantac.       Relevant Orders   Hepatitis, Acute    Other Visit Diagnoses    CVA tenderness        Given last UA at the hospital and continuing CVA tenderness. Will treat empirically for UTI. UC pending. Alarm symptoms reviewed.     Relevant Medications    ciprofloxacin (CIPRO) 250 MG tablet    Other Relevant Orders    POCT urinalysis dipstick (Completed)    CULTURE, URINE COMPREHENSIVE    CBC with Differential/Platelet    Need for influenza vaccination        Relevant Orders    Flu Vaccine QUAD 36+ mos PF IM (Fluarix & Fluzone Quad PF)       Meds ordered this encounter  Medications  . ciprofloxacin (CIPRO) 250 MG tablet     Sig: Take 1 tablet (250 mg total) by mouth 2 (two) times daily.    Dispense:  10 tablet    Refill:  0    Order Specific Question:  Supervising Provider    Answer:  Janeann Forehand [phone]  . loperamide (IMODIUM) 2 MG capsule    Sig: Take 1 capsule (2 mg total) by mouth as needed for diarrhea or loose stools.    Dispense:  30 capsule    Refill:  0    Order Specific Question:  Supervising Provider    Answer:  Janeann Forehand [[id number]]      Follow up plan: Return follow-up with GI in 3 weeks.Marland Kitchen

## 2015-07-27 NOTE — Assessment & Plan Note (Signed)
>  1 year. Was evaluated by GI last year but patient lost to follow-up. Could be alcohol related.  PRN immodium for symptoms. Previous stool studies have been negative.

## 2015-07-27 NOTE — Patient Instructions (Signed)
We will treat your for a UTI today given your back pain and CVA tenderness. Take Cipro  twice daily for 5 days.   We will recheck some labs to determine what is going on.   I have also made you an appt to see Dr. Servando Snare on February 27 at 1pm at [facility] in [place].   If you have severe abdominal pain, nausea, vomiting, blood in the urine, fever, or other concerning symptoms go to the ER.

## 2015-07-29 LAB — CULTURE, URINE COMPREHENSIVE

## 2015-08-20 ENCOUNTER — Ambulatory Visit: Payer: Self-pay | Admitting: Gastroenterology

## 2015-08-24 ENCOUNTER — Other Ambulatory Visit: Payer: Self-pay | Admitting: Family Medicine

## 2015-09-27 ENCOUNTER — Ambulatory Visit: Payer: Self-pay | Admitting: Gastroenterology

## 2015-09-28 ENCOUNTER — Other Ambulatory Visit: Payer: Self-pay | Admitting: Family Medicine

## 2015-10-22 ENCOUNTER — Other Ambulatory Visit: Payer: Self-pay | Admitting: Family Medicine

## 2015-10-23 MED ORDER — ALBUTEROL SULFATE HFA 108 (90 BASE) MCG/ACT IN AERS: 2.0000 | INHALATION_SPRAY | Freq: Four times a day (QID) | RESPIRATORY_TRACT | Status: DC | PRN

## 2015-10-25 ENCOUNTER — Encounter: Payer: Self-pay | Admitting: Family Medicine

## 2015-10-25 ENCOUNTER — Ambulatory Visit (INDEPENDENT_AMBULATORY_CARE_PROVIDER_SITE_OTHER): Payer: BLUE CROSS/BLUE SHIELD | Admitting: Family Medicine

## 2015-10-25 VITALS — BP 118/81 | HR 109 | Temp 98.8°F | Resp 16 | Ht 61.0 in | Wt 89.0 lb

## 2015-10-25 DIAGNOSIS — J0100 Acute maxillary sinusitis, unspecified: Secondary | ICD-10-CM | POA: Diagnosis not present

## 2015-10-25 DIAGNOSIS — J441 Chronic obstructive pulmonary disease with (acute) exacerbation: Secondary | ICD-10-CM | POA: Diagnosis not present

## 2015-10-25 DIAGNOSIS — Z720 Tobacco use: Secondary | ICD-10-CM

## 2015-10-25 MED ORDER — DM-GUAIFENESIN ER 30-600 MG PO TB12: 1.0000 | ORAL_TABLET | Freq: Two times a day (BID) | ORAL | Status: DC

## 2015-10-25 MED ORDER — PSEUDOEPHEDRINE HCL 60 MG PO TABS: 60.0000 mg | ORAL_TABLET | Freq: Three times a day (TID) | ORAL | Status: DC | PRN

## 2015-10-25 MED ORDER — DOXYCYCLINE HYCLATE 100 MG PO TABS: 100.0000 mg | ORAL_TABLET | Freq: Two times a day (BID) | ORAL | Status: DC

## 2015-10-25 MED ORDER — PREDNISONE 20 MG PO TABS: 40.0000 mg | ORAL_TABLET | Freq: Every day | ORAL | Status: DC

## 2015-10-25 NOTE — Progress Notes (Signed)
Subjective:    Patient ID: Joy Ball, female    DOB: [birth year], 50 y.o.   MRN: [id number]  HPI: Joy Ball is a 50 y.o. female presenting on 10/25/2015 for Nasal Congestion   HPI Pt presents for nasal congestion x 5 days. Stays congested a lot.  Teeth hurt. No fevers. Some facial swelling.  Some yellow nasal drainage. No ear pain or congestion. Does feel some chest tightness- coughing up sputum- unsure color. Has been smoking more due to stress. Has been coughing up sputum throughout the day- this is unusual for her. Feeling more short of breath than usual. Using albuterol more than usual.     Past Medical History  Diagnosis Date  . Seasonal allergies   . Migraines   . Asthma   . GERD (gastroesophageal reflux disease)   . COPD (chronic obstructive pulmonary disease) (HCC)   . Emphysema lung (HCC)     Current Outpatient Prescriptions on File Prior to Visit  Medication Sig  . albuterol (VENTOLIN HFA) 108 (90 Base) MCG/ACT inhaler Inhale 2 puffs into the lungs every 6 (six) hours as needed for wheezing or shortness of breath.  Marland Kitchen ibuprofen (ADVIL,MOTRIN) 800 MG tablet Take 800 mg by mouth as needed.  . loperamide (IMODIUM) 2 MG capsule TAKE 1 CAPSULE BY MOUTH AS NEEDED FOR DIARRHEA OR LOOSE STOOLS  . omeprazole (PRILOSEC) 20 MG capsule Take 20 mg by mouth daily.  . ondansetron (ZOFRAN) 4 MG tablet Take 1 tablet (4 mg total) by mouth 3 (three) times daily as needed.  . ranitidine (ZANTAC) 150 MG capsule Take 1 capsule (150 mg total) by mouth 2 (two) times daily.   No current facility-administered medications on file prior to visit.    Review of Systems  Constitutional: Positive for fatigue. Negative for fever and chills.  HENT: Positive for congestion, facial swelling and sinus pressure. Negative for ear pain, postnasal drip, rhinorrhea, sneezing, sore throat and trouble swallowing.   Respiratory: Positive for cough, chest tightness and shortness of breath. Negative for  wheezing.   Cardiovascular: Negative for chest pain, palpitations and leg swelling.  Gastrointestinal: Negative.  Negative for nausea, vomiting and diarrhea.  Musculoskeletal: Negative for neck pain and neck stiffness.  Neurological: Negative for headaches.   Per HPI unless specifically indicated above     Objective:    BP 118/81 mmHg  Pulse 109  Temp(Src) 98.8 F (37.1 C) (Oral)  Resp 16  Ht  (1.549 m)  Wt 89 lb (40.37 kg)  BMI 16.83 kg/m2  SpO2 97%  Wt Readings from Last 3 Encounters:  10/25/15 89 lb (40.37 kg)  07/27/15 91 lb (41.277 kg)  07/13/15 90 lb 3.2 oz (40.914 kg)    Physical Exam  Constitutional: She is oriented to person, place, and time. She appears well-developed and well-nourished. No distress.  HENT:  Head: Normocephalic and atraumatic.  Right Ear: Hearing and tympanic membrane normal. Tympanic membrane is not erythematous and not bulging.  Left Ear: Hearing and tympanic membrane normal. Tympanic membrane is not erythematous and not bulging.  Nose: Mucosal edema present. No sinus tenderness or nasal septal hematoma. Right sinus exhibits maxillary sinus tenderness (exquisitely tender). Right sinus exhibits no frontal sinus tenderness. Left sinus exhibits maxillary sinus tenderness. Left sinus exhibits no frontal sinus tenderness.  Mouth/Throat: Uvula is midline and mucous membranes are normal. Uvula swelling present. Posterior oropharyngeal edema present. No posterior oropharyngeal erythema.  Neck: Normal range of motion. Neck supple. No Brudzinski's sign and no  Kernig's sign noted.  Cardiovascular: Normal rate, regular rhythm and normal heart sounds.   Pulmonary/Chest: No accessory muscle usage. No tachypnea. No respiratory distress. She has no decreased breath sounds. She has wheezes in the right lower field and the left lower field. She has no rhonchi. She has no rales. Chest wall is not dull to percussion. She exhibits no tenderness and no bony tenderness.    Lymphadenopathy:    She has no cervical adenopathy.  Neurological: She is alert and oriented to person, place, and time.  Skin: Skin is warm and dry. No rash noted.  Psychiatric: She has a normal mood and affect. Her behavior is normal. Judgment and thought content normal.   Results for orders placed or performed in visit on 07/27/15  CULTURE, URINE COMPREHENSIVE  Result Value Ref Range   Urine Culture, Comprehensive Final report    Result 1 Comment   POCT urinalysis dipstick  Result Value Ref Range   Color, UA yellow    Clarity, UA clear    Glucose, UA negative    Bilirubin, UA negative    Ketones, UA negative    Spec Grav, UA 1.015    Blood, UA negative    pH, UA 5.0    Protein, UA negative    Urobilinogen, UA negative    Nitrite, UA negative    Leukocytes, UA Negative Negative      Assessment & Plan:   Problem List Items Addressed This Visit      Respiratory   Asthma with COPD (HCC) - Primary    Treat for exacerbation today due to increasing dyspnea, increasing sputum production. Prednisone 40mg  daily for 5 days. Inhalers PRN. Doxy for sinus infection will cover any atypicals in the lungs. Alarm symptoms reviewed. Return if not improving.       Relevant Medications   pseudoephedrine (SUDAFED) 60 MG tablet   dextromethorphan-guaiFENesin (MUCINEX DM) 30-600 MG 12hr tablet   predniSONE (DELTASONE) 20 MG tablet     Other   Tobacco abuse    Pt encouraged to quit smoking for her continued health.        Other Visit Diagnoses    Acute maxillary sinusitis, recurrence not specified        Treat for sinus infection. Doxy 2/2 PCN allergy. Supportive care at home. Alarm symptoms reviewed. Return if not improving.     Relevant Medications    doxycycline (VIBRA-TABS) 100 MG tablet    pseudoephedrine (SUDAFED) 60 MG tablet    dextromethorphan-guaiFENesin (MUCINEX DM) 30-600 MG 12hr tablet    predniSONE (DELTASONE) 20 MG tablet       Meds ordered this encounter   Medications  . doxycycline (VIBRA-TABS) 100 MG tablet    Sig: Take 1 tablet (100 mg total) by mouth 2 (two) times daily.    Dispense:  14 tablet    Refill:  0    Order Specific Question:  Supervising Provider    Answer:  Janeann ForehandHAWKINS JR, JAMES H [phone][[id number]]  . pseudoephedrine (SUDAFED) 60 MG tablet    Sig: Take 1 tablet (60 mg total) by mouth every 8 (eight) hours as needed.    Dispense:  30 tablet    Refill:  0    Order Specific Question:  Supervising Provider    Answer:  Janeann ForehandHAWKINS JR, JAMES H [phone][[id number]]  . dextromethorphan-guaiFENesin (MUCINEX DM) 30-600 MG 12hr tablet    Sig: Take 1 tablet by mouth 2 (two) times daily.    Dispense:  20 tablet  Refill:  0    Order Specific Question:  Supervising Provider    Answer:  Janeann Forehand [[id number]]  . predniSONE (DELTASONE) 20 MG tablet    Sig: Take 2 tablets (40 mg total) by mouth daily with breakfast. For 5 days    Dispense:  10 tablet    Refill:  0    Order Specific Question:  Supervising Provider    Answer:  Janeann Forehand [[id number]]      Follow up plan: Return if symptoms worsen or fail to improve.

## 2015-10-25 NOTE — Assessment & Plan Note (Signed)
Pt encouraged to quit smoking for her continued health.

## 2015-10-25 NOTE — Patient Instructions (Signed)
You can use supportive care at home to help with your symptoms. I have sent Mucinex DM to your pharmacy to help break up the congestion and soothe your cough. You can takes this twice daily.  I have also sent tesslon perles to your pharmacy to help with the cough- you can take these 3 times daily as needed. Honey is a natural cough suppressant- so add it to your tea in the morning.  If you have a humidifer, set that up in your bedroom at night.  Take doxycycline for sinus infection twice daily for 7 days.   To help with your breathing we will do a 5 day course of prednisone- take 2 tablets every morning for 5 days.    Please seek immediate medical attention if you develop shortness of breath not relieve by inhaler, chest pain/tightness, fever > 103 F or other concerning symptoms.

## 2015-10-25 NOTE — Assessment & Plan Note (Signed)
Treat for exacerbation today due to increasing dyspnea, increasing sputum production. Prednisone 40mg  daily for 5 days. Inhalers PRN. Doxy for sinus infection will cover any atypicals in the lungs. Alarm symptoms reviewed. Return if not improving.

## 2015-10-26 ENCOUNTER — Other Ambulatory Visit: Payer: Self-pay | Admitting: Family Medicine

## 2015-10-26 MED ORDER — ALBUTEROL SULFATE HFA 108 (90 BASE) MCG/ACT IN AERS: 2.0000 | INHALATION_SPRAY | Freq: Four times a day (QID) | RESPIRATORY_TRACT | Status: DC | PRN

## 2015-11-07 ENCOUNTER — Other Ambulatory Visit: Payer: Self-pay | Admitting: Family Medicine

## 2016-01-11 ENCOUNTER — Ambulatory Visit (INDEPENDENT_AMBULATORY_CARE_PROVIDER_SITE_OTHER): Payer: BLUE CROSS/BLUE SHIELD

## 2016-01-11 ENCOUNTER — Inpatient Hospital Stay
Admission: EM | Admit: 2016-01-11 | Discharge: 2016-01-15 | DRG: 871 | Disposition: A | Discharge status: Home / Self Care | Payer: BLUE CROSS/BLUE SHIELD | Attending: Internal Medicine | Admitting: Internal Medicine

## 2016-01-11 ENCOUNTER — Encounter: Payer: Self-pay | Admitting: Gynecology

## 2016-01-11 ENCOUNTER — Encounter: Payer: Self-pay | Admitting: Emergency Medicine

## 2016-01-11 ENCOUNTER — Ambulatory Visit (INDEPENDENT_AMBULATORY_CARE_PROVIDER_SITE_OTHER)
Admission: EM | Admit: 2016-01-11 | Discharge: 2016-01-11 | Disposition: A | Discharge status: Home / Self Care | Payer: Self-pay | Source: Home / Self Care | Attending: Family Medicine | Admitting: Family Medicine

## 2016-01-11 ENCOUNTER — Emergency Department: Payer: BLUE CROSS/BLUE SHIELD

## 2016-01-11 DIAGNOSIS — J9811 Atelectasis: Secondary | ICD-10-CM | POA: Diagnosis present

## 2016-01-11 DIAGNOSIS — R0902 Hypoxemia: Secondary | ICD-10-CM

## 2016-01-11 DIAGNOSIS — J189 Pneumonia, unspecified organism: Secondary | ICD-10-CM

## 2016-01-11 DIAGNOSIS — J9690 Respiratory failure, unspecified, unspecified whether with hypoxia or hypercapnia: Secondary | ICD-10-CM

## 2016-01-11 DIAGNOSIS — Z8249 Family history of ischemic heart disease and other diseases of the circulatory system: Secondary | ICD-10-CM

## 2016-01-11 DIAGNOSIS — J439 Emphysema, unspecified: Secondary | ICD-10-CM | POA: Diagnosis not present

## 2016-01-11 DIAGNOSIS — E876 Hypokalemia: Secondary | ICD-10-CM | POA: Diagnosis present

## 2016-01-11 DIAGNOSIS — F419 Anxiety disorder, unspecified: Secondary | ICD-10-CM | POA: Diagnosis present

## 2016-01-11 DIAGNOSIS — Z720 Tobacco use: Secondary | ICD-10-CM | POA: Diagnosis not present

## 2016-01-11 DIAGNOSIS — R918 Other nonspecific abnormal finding of lung field: Secondary | ICD-10-CM | POA: Diagnosis not present

## 2016-01-11 DIAGNOSIS — J9621 Acute and chronic respiratory failure with hypoxia: Secondary | ICD-10-CM | POA: Diagnosis present

## 2016-01-11 DIAGNOSIS — A419 Sepsis, unspecified organism: Principal | ICD-10-CM | POA: Diagnosis present

## 2016-01-11 DIAGNOSIS — Z9851 Tubal ligation status: Secondary | ICD-10-CM

## 2016-01-11 DIAGNOSIS — E43 Unspecified severe protein-calorie malnutrition: Secondary | ICD-10-CM | POA: Insufficient documentation

## 2016-01-11 DIAGNOSIS — J440 Chronic obstructive pulmonary disease with acute lower respiratory infection: Secondary | ICD-10-CM | POA: Diagnosis present

## 2016-01-11 DIAGNOSIS — R000 Tachycardia, unspecified: Secondary | ICD-10-CM | POA: Diagnosis present

## 2016-01-11 DIAGNOSIS — K219 Gastro-esophageal reflux disease without esophagitis: Secondary | ICD-10-CM | POA: Diagnosis present

## 2016-01-11 DIAGNOSIS — J441 Chronic obstructive pulmonary disease with (acute) exacerbation: Secondary | ICD-10-CM | POA: Diagnosis not present

## 2016-01-11 DIAGNOSIS — J8410 Pulmonary fibrosis, unspecified: Secondary | ICD-10-CM | POA: Diagnosis present

## 2016-01-11 DIAGNOSIS — Z825 Family history of asthma and other chronic lower respiratory diseases: Secondary | ICD-10-CM

## 2016-01-11 DIAGNOSIS — Z801 Family history of malignant neoplasm of trachea, bronchus and lung: Secondary | ICD-10-CM

## 2016-01-11 DIAGNOSIS — Z716 Tobacco abuse counseling: Secondary | ICD-10-CM

## 2016-01-11 DIAGNOSIS — R0600 Dyspnea, unspecified: Secondary | ICD-10-CM

## 2016-01-11 DIAGNOSIS — F17210 Nicotine dependence, cigarettes, uncomplicated: Secondary | ICD-10-CM | POA: Diagnosis present

## 2016-01-11 DIAGNOSIS — I959 Hypotension, unspecified: Secondary | ICD-10-CM | POA: Diagnosis present

## 2016-01-11 DIAGNOSIS — Z880 Allergy status to penicillin: Secondary | ICD-10-CM

## 2016-01-11 DIAGNOSIS — Z9889 Other specified postprocedural states: Secondary | ICD-10-CM

## 2016-01-11 DIAGNOSIS — R911 Solitary pulmonary nodule: Secondary | ICD-10-CM | POA: Diagnosis present

## 2016-01-11 LAB — BASIC METABOLIC PANEL
Anion gap: 12 (ref 5–15)
BUN: 9 mg/dL (ref 6–20)
CHLORIDE: 108 mmol/L (ref 101–111)
CO2: 19 mmol/L — ABNORMAL LOW (ref 22–32)
CREATININE: 0.64 mg/dL (ref 0.44–1.00)
Calcium: 8.9 mg/dL (ref 8.9–10.3)
GFR calc Af Amer: >60 mL/min (ref >60)
GFR calc non Af Amer: >60 mL/min (ref >60)
Glucose, Bld: 98 mg/dL (ref 65–99)
Potassium: 3 mmol/L — ABNORMAL LOW (ref 3.5–5.1)
SODIUM: 139 mmol/L (ref 135–145)

## 2016-01-11 LAB — CBC WITH DIFFERENTIAL/PLATELET
Basophils Absolute: 0.1 10*3/uL (ref 0–0.1)
Basophils Relative: 0 %
EOS ABS: 0 10*3/uL (ref 0–0.7)
EOS PCT: 0 %
HCT: 39.3 % (ref 35.0–47.0)
HEMOGLOBIN: 13.5 g/dL (ref 12.0–16.0)
LYMPHS ABS: 2.9 10*3/uL (ref 1.0–3.6)
Lymphocytes Relative: 14 %
MCH: 36 pg — AB (ref 26.0–34.0)
MCHC: 34.3 g/dL (ref 32.0–36.0)
MCV: 104.8 fL — ABNORMAL HIGH (ref 80.0–100.0)
MONOS PCT: 5 %
Monocytes Absolute: 1 10*3/uL — ABNORMAL HIGH (ref 0.2–0.9)
NEUTROS PCT: 81 %
Neutro Abs: 16.4 10*3/uL — ABNORMAL HIGH (ref 1.4–6.5)
Platelets: 442 10*3/uL — ABNORMAL HIGH (ref 150–440)
RBC: 3.75 MIL/uL — ABNORMAL LOW (ref 3.80–5.20)
RDW: 13 % (ref 11.5–14.5)
WBC: 20.4 10*3/uL — ABNORMAL HIGH (ref 3.6–11.0)

## 2016-01-11 LAB — TROPONIN I
TROPONIN I: 0.07 ng/mL — AB (ref <0.03)
Troponin I: <0.03 ng/mL (ref <0.03)

## 2016-01-11 MED ORDER — LEVOFLOXACIN IN D5W 750 MG/150ML IV SOLN
750.0000 mg | Freq: Once | INTRAVENOUS | Status: AC
  Administered 2016-01-11: 750 mg via INTRAVENOUS
  Filled 2016-01-11: qty 150

## 2016-01-11 MED ORDER — IPRATROPIUM-ALBUTEROL 0.5-2.5 (3) MG/3ML IN SOLN
3.0000 mL | Freq: Once | RESPIRATORY_TRACT | Status: AC
Start: 2016-01-11 — End: 2016-01-11
  Administered 2016-01-11: 3 mL via RESPIRATORY_TRACT

## 2016-01-11 MED ORDER — LORAZEPAM 2 MG/ML IJ SOLN
1.0000 mg | Freq: Once | INTRAMUSCULAR | Status: AC
  Administered 2016-01-11: 1 mg via INTRAVENOUS
  Filled 2016-01-11: qty 1

## 2016-01-11 MED ORDER — SODIUM CHLORIDE 0.9 % IV SOLN
INTRAVENOUS | Status: DC
  Administered 2016-01-11: 22:00:00 via INTRAVENOUS

## 2016-01-11 MED ORDER — ALBUTEROL SULFATE (2.5 MG/3ML) 0.083% IN NEBU
10.0000 mg/h | INHALATION_SOLUTION | RESPIRATORY_TRACT | Status: DC
  Administered 2016-01-11: 10 mg/h via RESPIRATORY_TRACT

## 2016-01-11 MED ORDER — GUAIFENESIN-CODEINE 100-10 MG/5ML PO SOLN
10.0000 mL | ORAL | Status: DC | PRN
  Administered 2016-01-12 – 2016-01-15 (×12): 10 mL via ORAL
  Filled 2016-01-11 (×7): qty 10
  Filled 2016-01-11: qty 5
  Filled 2016-01-11 (×5): qty 10

## 2016-01-11 MED ORDER — ALBUTEROL SULFATE (2.5 MG/3ML) 0.083% IN NEBU
10.0000 mg | INHALATION_SOLUTION | Freq: Once | RESPIRATORY_TRACT | Status: DC
  Filled 2016-01-11: qty 12

## 2016-01-11 MED ORDER — POTASSIUM CHLORIDE 20 MEQ PO PACK
40.0000 meq | PACK | Freq: Two times a day (BID) | ORAL | Status: DC
  Administered 2016-01-11: 40 meq via ORAL
  Filled 2016-01-11: qty 2

## 2016-01-11 MED ORDER — IPRATROPIUM-ALBUTEROL 0.5-2.5 (3) MG/3ML IN SOLN
3.0000 mL | Freq: Once | RESPIRATORY_TRACT | Status: AC
  Administered 2016-01-11: 3 mL via RESPIRATORY_TRACT

## 2016-01-11 NOTE — ED Notes (Signed)
Spoke with Dr Emmit PomfretHugelmeyer, pt moved to Step Down status.  Pt finishing continuous neb treatment.  Pt alert and oriented.  Pt continues to refuse bipap at this time, states that her husband didn't do well on a bipap previously.  Dr Emmit PomfretHugelmeyer notified of this. Whiteboard updated with current information.

## 2016-01-11 NOTE — ED Notes (Signed)
Bed assignment ready, however patient noted to be coughing more and desaturating; supplemental oxygen increased. Patient noted to be in the low 80s despite oxygen. MD made aware and presents to bedside for reassessment. Mayford KnifeWilliams, MD with [facility] for 2 additional Duonebs, which will be a cumulative total of 5. Will readdress patient's stability to transfer to floor when Duonebs complete.

## 2016-01-11 NOTE — ED Provider Notes (Signed)
CSN: [id number]     Arrival date & time 01/11/16  1717 History   First MD Initiated Contact with Patient 01/11/16 1803     Chief Complaint  Patient presents with  . Cough   (Consider location/radiation/quality/duration/timing/severity/associated sxs/prior Treatment) HPI Comments: 50 yo female smoker with emphysema with a 3-4 days h/o progressively worsening cough, shortness of breath and chills. Denies any fevers, chest pains.   The history is provided by the patient.    Past Medical History  Diagnosis Date  . Seasonal allergies   . Migraines   . Asthma   . GERD (gastroesophageal reflux disease)   . COPD (chronic obstructive pulmonary disease) (HCC)   . Emphysema lung [facility])    Past Surgical History  Procedure Laterality Date  . Cesarean section    . Tubal ligation     Family History  Problem Relation Age of Onset  . Lung cancer Father     was a smoker  . Heart disease Maternal Grandfather   . Emphysema Mother    Social History  Substance Use Topics  . Smoking status: Current Every Day Smoker -- 1.00 packs/day for 30 years    Types: Cigarettes  . Smokeless tobacco: Never Used  . Alcohol Use: 0.0 oz/week    0 Standard drinks or equivalent per week     Comment: occ   OB History    No data available     Review of Systems  Allergies  Amoxicillin  Home Medications   Prior to Admission medications   Medication Sig Start Date End Date Taking? Authorizing Provider  albuterol (VENTOLIN HFA) 108 (90 Base) MCG/ACT inhaler Inhale 2 puffs into the lungs every 6 (six) hours as needed for wheezing or shortness of breath. 10/26/15  Yes Amy Rusty Aus, NP  ibuprofen (ADVIL,MOTRIN) 800 MG tablet Take 800 mg by mouth as needed.   Yes Historical Provider, MD  dextromethorphan-guaiFENesin (MUCINEX DM) 30-600 MG 12hr tablet Take 1 tablet by mouth 2 (two) times daily. 10/25/15   Amy Rusty Aus, NP  doxycycline (VIBRA-TABS) 100 MG tablet Take 1 tablet (100 mg total) by mouth 2 (two)  times daily. 10/25/15   Amy Rusty Aus, NP  loperamide (IMODIUM) 2 MG capsule TAKE 1 CAPSULE BY MOUTH AS NEEDED FOR DIARRHEA OR LOOSE STOOLS 09/28/15   Amy Rusty Aus, NP  omeprazole (PRILOSEC) 20 MG capsule Take 20 mg by mouth daily. 06/27/14   Historical Provider, MD  ondansetron (ZOFRAN) 4 MG tablet Take 1 tablet (4 mg total) by mouth 3 (three) times daily as needed. 07/27/15   Amy Rusty Aus, NP  predniSONE (DELTASONE) 20 MG tablet Take 2 tablets (40 mg total) by mouth daily with breakfast. For 5 days 10/25/15   Amy Rusty Aus, NP  pseudoephedrine (SUDAFED) 60 MG tablet Take 1 tablet (60 mg total) by mouth every 8 (eight) hours as needed. 10/25/15   Amy Rusty Aus, NP  ranitidine (ZANTAC) 150 MG capsule Take 1 capsule (150 mg total) by mouth 2 (two) times daily. 07/13/15   Amy Rusty Aus, NP   Meds Ordered and Administered this Visit   Medications  ipratropium-albuterol (DUONEB) 0.5-2.5 (3) MG/3ML nebulizer solution 3 mL (3 mLs Nebulization Given 01/11/16 1810)    BP 112/67 mmHg  Pulse 102  Temp(Src) 98.4 F (36.9 C) (Oral)  Resp 16  Ht  (1.549 m)  Wt 89 lb (40.37 kg)  BMI 16.83 kg/m2  SpO2 88% No data found.   Physical Exam  Constitutional: She  appears well-developed and well-nourished. No distress.  HENT:  Head: Normocephalic and atraumatic.  Right Ear: Tympanic membrane, external ear and ear canal normal.  Left Ear: Tympanic membrane, external ear and ear canal normal.  Nose: No nose lacerations, sinus tenderness, nasal deformity, septal deviation or nasal septal hematoma. No epistaxis.  No foreign bodies.  Mouth/Throat: Uvula is midline, oropharynx is clear and moist and mucous membranes are normal. No oropharyngeal exudate.  Eyes: Conjunctivae and EOM are normal. Pupils are equal, round, and reactive to light. Right eye exhibits no discharge. Left eye exhibits no discharge. No scleral icterus.  Neck: Normal range of motion. Neck supple. No thyromegaly present.   Cardiovascular: Normal rate, regular rhythm and normal heart sounds.   Pulmonary/Chest: Effort normal. No respiratory distress. She has no wheezes. She has rales.  Lymphadenopathy:    She has no cervical adenopathy.  Skin: She is not diaphoretic.  Nursing note and vitals reviewed.   ED Course  Procedures (including critical care time)  Labs Review Labs Reviewed - No data to display  Imaging Review Dg Chest 2 View  01/11/2016  CLINICAL DATA:  Cough, shortness of breath and aching for 3-4 days. History of asthma and COPD. EXAM: CHEST  2 VIEW COMPARISON:  Chest x-ray from earlier same day, chest x-ray dated 12/17/2011 and chest CT dated 05/04/2012. FINDINGS: Bibasilar airspace opacities are slightly increased and size. Upper lungs remain clear. Lungs appear hyperexpanded with emphysematous changes at each lung apex. No pleural effusion or pneumothorax seen. Cardiomediastinal silhouette is stable. Osseous structures about the chest are unremarkable. IMPRESSION: Persistent bibasilar airspace opacities, slightly increased compared to the chest x-ray performed earlier today, again compatible with bibasilar pneumonia. COPD/emphysema. Electronically Signed   By: Bary RichardStan  Maynard M.D.   On: 01/11/2016 20:16   Dg Chest 2 View  01/11/2016  CLINICAL DATA:  Cough, 5 days duration. History of pulmonary fibrosis. EXAM: CHEST  2 VIEW COMPARISON:  01/06/2012 FINDINGS: Heart size is normal. Mediastinal shadows are normal. The patient has advanced chronic emphysema. There are acute infiltrates in both lower lungs consistent with bilateral bronchopneumonia. No lobar collapse or effusion. No acute bone finding. IMPRESSION: Background pattern of severe emphysema. Bilateral lower lobe bronchopneumonia. Electronically Signed   By: Paulina FusiMark  Shogry M.D.   On: 01/11/2016 18:56     Visual Acuity Review  Right Eye Distance:   Left Eye Distance:   Bilateral Distance:    Right Eye Near:   Left Eye Near:    Bilateral  Near:         MDM   1. COPD exacerbation (HCC)   2. Bilateral pneumonia   3. Hypoxia     1.x-ray results and diagnosis reviewed with patient; patient given duoneb treatment x 1 without any improvement; patient given 2L O2 per [place]; recommend patient go to ED for further evaluation and possible hospital admission; patient transported to hospital ED by EMS in stable condition; report called to charge RN at [facility]  Login Muckleroy, MD 01/11/16 2019

## 2016-01-11 NOTE — H&P (Addendum)
Joy Ball is an 50 y.o. female.   Chief Complaint: Cough, SOB HPI: This is a 50 year old white female with COPD, asthma and pulmonary fibrosis who presents to the ED referred by Urgent Care for pneumonia with hypoxia.  She states that she was in her usual state of health until about one month ago when her chronic cough began to worsen.  Over the past week her cough has become significantly worse.  Cough is productive of varying colors of sputum and is sometimes dry.  She reports shortness and breath but denies fevers, chills, sick contacts.   She was seen by Urgent Care today where she received a chest xray indicating pneumonia.  She received Duonebs and Solumedrol and was referred to ED for further evaluation.    Here a repeat chest xray reveals bibasilar pneumonia in addition to COPD.  She has received Duoneb treatments x5 and Ativan for anxiety.  During coughing fits her O2 sats drop into the 80s and she remains tachycardic.    Past Medical History  Diagnosis Date  . Seasonal allergies   . Migraines   . Asthma   . GERD (gastroesophageal reflux disease)   . COPD (chronic obstructive pulmonary disease) ([place])   . Emphysema lung [facility])     Past Surgical History  Procedure Laterality Date  . Cesarean section    . Tubal ligation      Family History  Problem Relation Age of Onset  . Lung cancer Father     was a smoker  . Heart disease Maternal Grandfather   . Emphysema Mother    Social History:  reports that she has been smoking Cigarettes.  She has a 30 pack-year smoking history. She has never used smokeless tobacco. She reports that she drinks alcohol. She reports that she does not use illicit drugs.  Allergies:  Allergies  Allergen Reactions  . Amoxicillin Hives     (Not in a hospital admission)  Results for orders placed or performed during the hospital encounter of 01/11/16 (from the past 48 hour(s))  CBC with Differential     Status: Abnormal   Collection Time:  01/11/16  7:39 PM  Result Value Ref Range   WBC 20.4 (H) 3.6 - 11.0 K/uL   RBC 3.75 (L) 3.80 - 5.20 MIL/uL   Hemoglobin 13.5 12.0 - 16.0 g/dL   HCT 39.3 35.0 - 47.0 %   MCV 104.8 (H) 80.0 - 100.0 fL   MCH 36.0 (H) 26.0 - 34.0 pg   MCHC 34.3 32.0 - 36.0 g/dL   RDW 13.0 11.5 - 14.5 %   Platelets 442 (H) 150 - 440 K/uL   Neutrophils Relative % 81 %   Neutro Abs 16.4 (H) 1.4 - 6.5 K/uL   Lymphocytes Relative 14 %   Lymphs Abs 2.9 1.0 - 3.6 K/uL   Monocytes Relative 5 %   Monocytes Absolute 1.0 (H) 0.2 - 0.9 K/uL   Eosinophils Relative 0 %   Eosinophils Absolute 0.0 0 - 0.7 K/uL   Basophils Relative 0 %   Basophils Absolute 0.1 0 - 0.1 K/uL  Basic metabolic panel     Status: Abnormal   Collection Time: 01/11/16  7:39 PM  Result Value Ref Range   Sodium 139 135 - 145 mmol/L   Potassium 3.0 (L) 3.5 - 5.1 mmol/L   Chloride 108 101 - 111 mmol/L   CO2 19 (L) 22 - 32 mmol/L   Glucose, Bld 98 65 - 99 mg/dL  BUN 9 6 - 20 mg/dL   Creatinine, Ser 0.64 0.44 - 1.00 mg/dL   Calcium 8.9 8.9 - 10.3 mg/dL   GFR calc non Af Amer >60 >60 mL/min   GFR calc Af Amer >60 >60 mL/min    Comment: (NOTE) The eGFR has been calculated using the CKD EPI equation. This calculation has not been validated in all clinical situations. eGFR's persistently <60 mL/min signify possible Chronic Kidney Disease.    Anion gap 12 5 - 15  Troponin I     Status: Abnormal   Collection Time: 01/11/16  7:39 PM  Result Value Ref Range   Troponin I 0.07 (HH) <0.03 ng/mL    Comment: CRITICAL RESULT CALLED TO, READ BACK BY AND VERIFIED WITH KAILEY WALKER AT 2053 ON 01/11/16 RWW   Blood gas, venous     Status: Abnormal (Preliminary result)   Collection Time: 01/11/16  7:47 PM  Result Value Ref Range   FIO2 PENDING    Delivery systems PENDING    pH, Ven 7.43 7.320 - 7.430   pCO2, Ven 34 (L) 44.0 - 60.0 mmHg   pO2, Ven 44.0 31.0 - 45.0 mmHg   Bicarbonate 22.6 21.0 - 28.0 mEq/L   Acid-base deficit 1.1 0.0 - 2.0 mmol/L    O2 Saturation 81.2 %   Patient temperature 37.0    Collection site VEIN    Sample type VEIN   Troponin I (q 6hr x 3)     Status: None   Collection Time: 01/11/16  9:55 PM  Result Value Ref Range   Troponin I <0.03 <0.03 ng/mL   Dg Chest 2 View  01/11/2016  CLINICAL DATA:  Cough, shortness of breath and aching for 3-4 days. History of asthma and COPD. EXAM: CHEST  2 VIEW COMPARISON:  Chest x-ray from earlier same day, chest x-ray dated 12/17/2011 and chest CT dated 05/04/2012. FINDINGS: Bibasilar airspace opacities are slightly increased and size. Upper lungs remain clear. Lungs appear hyperexpanded with emphysematous changes at each lung apex. No pleural effusion or pneumothorax seen. Cardiomediastinal silhouette is stable. Osseous structures about the chest are unremarkable. IMPRESSION: Persistent bibasilar airspace opacities, slightly increased compared to the chest x-ray performed earlier today, again compatible with bibasilar pneumonia. COPD/emphysema. Electronically Signed   By: Franki Cabot M.D.   On: 01/11/2016 20:16   Dg Chest 2 View  01/11/2016  CLINICAL DATA:  Cough, 5 days duration. History of pulmonary fibrosis. EXAM: CHEST  2 VIEW COMPARISON:  01/06/2012 FINDINGS: Heart size is normal. Mediastinal shadows are normal. The patient has advanced chronic emphysema. There are acute infiltrates in both lower lungs consistent with bilateral bronchopneumonia. No lobar collapse or effusion. No acute bone finding. IMPRESSION: Background pattern of severe emphysema. Bilateral lower lobe bronchopneumonia. Electronically Signed   By: Nelson Chimes M.D.   On: 01/11/2016 18:56    ROS  CONSTITUTIONAL: No documented fever. No fatigue, weakness. No weight gain, no weight loss.  EYES: No blurry or double vision.  ENT: No tinnitus. No postnasal drip. No redness of the oropharynx.  RESPIRATORY: Positive cough and dyspnea, no wheeze, no hemoptysis. CARDIOVASCULAR: No chest pain. No orthopnea. No  palpitations. No syncope.  GASTROINTESTINAL: No nausea, no vomiting or diarrhea. No abdominal pain. No melena or hematochezia.  GENITOURINARY: No dysuria or hematuria.  ENDOCRINE: No polyuria or nocturia. No heat or cold intolerance.  HEMATOLOGY: No anemia. No bruising. No bleeding.  INTEGUMENTARY: No rashes. No lesions.  MUSCULOSKELETAL: No arthritis. No swelling. No gout.  NEUROLOGIC:  No numbness, tingling, or ataxia. No seizure-type activity.  PSYCHIATRIC: Positive anxiety. No insomnia. No ADD.   Blood pressure 96/69, pulse 99, temperature 98.1 F (36.7 C), temperature source Oral, resp. rate 17, height 5' 1"  (1.549 m), weight 40.37 kg (89 lb), SpO2 100 %.   Physical Exam  GENERAL:  well-developed, well-nourished lying in the bed in no acute distress. EYES: Pupils equal, round, reactive to light and accommodation. No scleral icterus. Extraocular muscles intact. HEENT: Head atraumatic, normocephalic. Oropharynx and nasopharynx clear. Mucus membranes moist. NECK: Supple, full range of motion. No jugular venous distention. No thyroid enlargement, no tenderness, no lymphadenopathy. LUNGS: Diminished breath sounds bilateral bases.  Inspiratory effort limited by coughing. No wheezing, No use of accessory muscles of respiration. CARDIOVASCULAR: S1, S2 normal. No murmurs, rubs, or gallops. Cap refill <2 seconds. ABDOMEN: Soft, nontender, nondistended. No rebound, guarding, rigidity. Normoactive bowel sounds present in all four quadrants. No organomegaly or mass. EXTREMITIES: Full range of motion. No pedal edema, cyanosis, or clubbing. NEUROLOGIC: Cranial nerves II through XII are grossly intact with no focal sensorimotor deficit. Muscle strength 5/5 in all extremities. Sensation intact. Gait not checked. PSYCHIATRIC: The patient is alert and oriented x 3. Normal affect, mood, thought content. Slightly anxious.  SKIN: Warm, dry, and intact without obvious rash, lesion, or  ulcer.  ? Assessment/Plan This is a 50 year old white female with history of COPD, asthma, pulmonary fibrosis, pulmonary nodules, GERD, seasonal allergies now presenting with: 1. Sepsis secondary to CAP with COPD exacerbation - Admit to telemetry for continuous monitoring.  IV Levaquin, IV steroids, Duonebs, Mucinex, IV fluids, blood and sputum cultures.  2. Elevate troponin without chest pain - Cycle troponins, lipid panel, consider cardiology evaluation as warranted.  3. Hypokalemia  - Replace orally. 4. GERD, Seasonal allergies - Continue home medications 5. Tobacco use disorder - cessation advised.  Nicotine patch for replacement therapy.  6. Anxiety - of note, patient recently lost her husband.  May consider anxiolytics.  7. Routine DVT prophylaxis with Lovenox and SCDs.    All the records are reviewed and case discussed with ED provider.  Management plans discussed with the patient and/or family who express understanding and agree with plan of care.  CODE STATUS: Full  TOTAL TIME TAKING CARE OF THIS PATIENT: 50 minutes.  Between 7am to 6pm - Pager - [phone]  After 6pm go to [email or web address] - [place]  ?  [facility]  Office [phone]  CC: Primary care physician; Dr. Ree Shay Gabriellah Rabel, DO 01/11/2016, 10:31 PM    01/11/16  11:23PM Addendum:  Shortly following admission, patient continued to desaturate into the low 80s.  Continuous nebs and O2 applied.  Critical care consultation was requested for consideration of BiPAP and patient was upgraded to critical status.

## 2016-01-11 NOTE — ED Notes (Signed)
Pt arrived via EMS from urgent care with complaints of shortness of breath. Pt room air sat 81% placed on 2L with an icrease to 94%. Pt had 3 duo-neb and 125 of solumedrol en route.

## 2016-01-11 NOTE — ED Notes (Addendum)
Patient tolerating duoneb treatments well and resting more comfortably at this time. Maintaining SpO2 at 98% while duoneb in progress.

## 2016-01-11 NOTE — ED Notes (Signed)
Patient c/o cough , short of breath and aching 3-4 days. Per patient history of asthma and COPD.

## 2016-01-11 NOTE — ED Notes (Signed)
RT at bedside at this time. Bipap presented as an option. Patient reports that she has had Bipap before and she is currently refusing to allow this therapy to be utilized at this time. Primary nurse made aware.

## 2016-01-11 NOTE — ED Provider Notes (Addendum)
[facility] Emergency Department Provider Note        Time seen: ----------------------------------------- 7:46 PM on 01/11/2016 -----------------------------------------    I have reviewed the triage vital signs and the nursing notes.   HISTORY  Chief Complaint Shortness of Breath    HPI Joy Ball is a 50 y.o. female who presents to the ER for cough and shortness of breath and aching for the last 3-4 days. She describes an aching in the center of her chest. She doesn't history of asthma and COPD. According to her history she arrived from the urgent care difficulty breathing. There her room air sats were 81% and improved to 94% with nasal cannula oxygen. She did receive 3 DuoNeb times and Solu-Medrol prior to arrival here.   Past Medical History  Diagnosis Date  . Seasonal allergies   . Migraines   . Asthma   . GERD (gastroesophageal reflux disease)   . COPD (chronic obstructive pulmonary disease) (HCC)   . Emphysema lung [facility](HCC)     Patient Active Problem List   Diagnosis Date Noted  . Dermatitis, eczematoid 07/27/2015  . Epigastric pain 07/27/2015  . Functional diarrhea 07/27/2015  . GERD (gastroesophageal reflux disease) 07/13/2015  . Asthma with COPD (HCC) 01/26/2012  . Pulmonary fibrosis (HCC) 01/26/2012  . Tobacco abuse 01/26/2012  . Pulmonary nodule, left 01/26/2012    Past Surgical History  Procedure Laterality Date  . Cesarean section    . Tubal ligation      Allergies Amoxicillin  Social History Social History  Substance Use Topics  . Smoking status: Current Every Day Smoker -- 1.00 packs/day for 30 years    Types: Cigarettes  . Smokeless tobacco: Never Used  . Alcohol Use: 0.0 oz/week    0 Standard drinks or equivalent per week     Comment: occ    Review of Systems Constitutional: Negative for fever. Cardiovascular: Positive for chest pain Respiratory: Positive for shortness of breath Gastrointestinal:  Negative for abdominal pain, vomiting and diarrhea. Genitourinary: Negative for dysuria. Musculoskeletal: Negative for back pain. Skin: Negative for rash. Neurological: Negative for headaches, focal weakness or numbness.  10-point ROS otherwise negative.  ____________________________________________   PHYSICAL EXAM:  VITAL SIGNS: ED Triage Vitals  Enc Vitals Group     BP 01/11/16 1934 119/75 mmHg     Pulse Rate 01/11/16 1934 109     Resp 01/11/16 1934 21     Temp 01/11/16 1934 98.1 F (36.7 C)     Temp Source 01/11/16 1934 Oral     SpO2 01/11/16 1934 94 %     Weight 01/11/16 1934 89 lb (40.37 kg)     Height 01/11/16 1934 5\' 1"  (1.549 m)     Head Cir --      Peak Flow --      Pain Score 01/11/16 1935 6     Pain Loc --      Pain Edu? --      Excl. in GC? --     Constitutional: Alert and oriented, Thin, mild distress Eyes: Conjunctivae are normal. PERRL. Normal extraocular movements. ENT   Head: Normocephalic and atraumatic.   Nose: No congestion/rhinnorhea.   Mouth/Throat: Mucous membranes are moist.   Neck: No stridor. Cardiovascular: Normal rate, regular rhythm. No murmurs, rubs, or gallops. Respiratory: Tachypnea with diminished but mostly clear breath sounds bilaterally. Gastrointestinal: Soft and nontender. Normal bowel sounds Musculoskeletal: Nontender with normal range of motion in all extremities. No lower extremity tenderness nor edema.  Neurologic:  Normal speech and language. No gross focal neurologic deficits are appreciated.  Skin:  Skin is warm, dry and intact. No rash noted. Psychiatric: Depressed mood and affect. ____________________________________________  EKG: Interpreted by me. Sinus tachycardia with rate 110 bpm, normal PR interval, normal QRS, normal QT interval. Normal axis.  ____________________________________________  ED COURSE:  Pertinent labs & imaging results that were available during my care of the patient were reviewed by  me and considered in my medical decision making (see chart for details). Patient presents to ER with one appears to be COPD exacerbation. We'll check basic labs and imaging. ____________________________________________    LABS (pertinent positives/negatives)  Labs Reviewed  CBC WITH DIFFERENTIAL/PLATELET - Abnormal; Notable for the following:    WBC 20.4 (*)    RBC 3.75 (*)    MCV 104.8 (*)    MCH 36.0 (*)    Platelets 442 (*)    Neutro Abs 16.4 (*)    Monocytes Absolute 1.0 (*)    All other components within normal limits  BASIC METABOLIC PANEL - Abnormal; Notable for the following:    Potassium 3.0 (*)    CO2 19 (*)    All other components within normal limits  TROPONIN I - Abnormal; Notable for the following:    Troponin I 0.07 (*)    All other components within normal limits  BLOOD GAS, VENOUS - Abnormal; Notable for the following:    pCO2, Ven 34 (*)    All other components within normal limits  CULTURE, BLOOD (ROUTINE X 2)  CULTURE, BLOOD (ROUTINE X 2)  CULTURE, EXPECTORATED SPUTUM-ASSESSMENT    RADIOLOGY Images were viewed by me  Chest x-ray IMPRESSION: Persistent bibasilar airspace opacities, slightly increased compared to the chest x-ray performed earlier today, again compatible with bibasilar pneumonia.  COPD/emphysema.  CRITICAL CARE Performed by: Emily Filbert   Total critical care time: 30 minutes  Critical care time was exclusive of separately billable procedures and treating other patients.  Critical care was necessary to treat or prevent imminent or life-threatening deterioration.  Critical care was time spent personally by me on the following activities: development of treatment plan with patient and/or surrogate as well as nursing, discussions with consultants, evaluation of patient's response to treatment, examination of patient, obtaining history from patient or surrogate, ordering and performing treatments and interventions, ordering  and review of laboratory studies, ordering and review of radiographic studies, pulse oximetry and re-evaluation of patient's condition.  ____________________________________________  FINAL ASSESSMENT AND PLAN  Dyspnea, COPD exacerbation, pneumonia, Elevated troponin  Plan: Patient with labs and imaging as dictated above. Patient presents with hypoxia, leukocytosis and chest x-ray findings consistent with pneumonia. She will be placed on IV Levaquin. I will discuss with the hospitalist for admission.   Emily Filbert, MD   Note: This dictation was prepared with Dragon dictation. Any transcriptional errors that result from this process are unintentional   Emily Filbert, MD 01/11/16 2024  Emily Filbert, MD 01/11/16 2025  Emily Filbert, MD 01/11/16 2102

## 2016-01-11 NOTE — ED Notes (Addendum)
Dr. Mayford KnifeWilliams re assessed pt after continual de sat, verbal order given for continuous nebulizer, albuterol 10mg . Respiratory called and at bedside administering medication.

## 2016-01-11 NOTE — ED Notes (Signed)
Supervisor called to question appropriateness of patient's bed assignment given (+) troponin. AC made aware that patient desaturating as well. AC asking that MD be consulted for bed assignment change to 2A; this RN spoke with Dr. Lenord FellersHuglemeyer and [facility] received to change assignment to 2A; [facility] this RN.

## 2016-01-11 NOTE — ED Notes (Addendum)
SpO2 alarm began ringing and patient states she was having difficulty breathing.  Patient coughing excessively and upon auscultation, patient had little air movement within lungs.  MD notified and verbal order obtained for 2 duoneb treatment.  See MAR.  Patient also bumped up to 4 L nasal cannula.

## 2016-01-11 NOTE — ED Notes (Signed)
Patient still relatively HYPOxic s/p Duonebs. On continuous Albuterol SVN at this time; RT feels that patient needs to be monitored more closely in CCU. Dr. Mayford KnifeWilliams to bedside and patient continues to be SOB and her chest is tight. Spoke with Huglemeyer, DO - VORB to change patient's bed assignment once again to CCU step down. Order to be changed by this RN.

## 2016-01-12 LAB — COMPREHENSIVE METABOLIC PANEL
ALT: 11 U/L — ABNORMAL LOW (ref 14–54)
AST: 23 U/L (ref 15–41)
Albumin: 2.9 g/dL — ABNORMAL LOW (ref 3.5–5.0)
Alkaline Phosphatase: 94 U/L (ref 38–126)
Anion gap: 4 — ABNORMAL LOW (ref 5–15)
BILIRUBIN TOTAL: 0.6 mg/dL (ref 0.3–1.2)
BUN: 7 mg/dL (ref 6–20)
CHLORIDE: 110 mmol/L (ref 101–111)
CO2: 24 mmol/L (ref 22–32)
Calcium: 8.4 mg/dL — ABNORMAL LOW (ref 8.9–10.3)
Creatinine, Ser: 0.45 mg/dL (ref 0.44–1.00)
GLUCOSE: 130 mg/dL — AB (ref 65–99)
POTASSIUM: 3.9 mmol/L (ref 3.5–5.1)
Sodium: 138 mmol/L (ref 135–145)
TOTAL PROTEIN: 5.6 g/dL — AB (ref 6.5–8.1)

## 2016-01-12 LAB — PHOSPHORUS: Phosphorus: 3.1 mg/dL (ref 2.5–4.6)

## 2016-01-12 LAB — CBC
HEMATOCRIT: 35.4 % (ref 35.0–47.0)
Hemoglobin: 12 g/dL (ref 12.0–16.0)
MCH: 35.6 pg — AB (ref 26.0–34.0)
MCHC: 33.9 g/dL (ref 32.0–36.0)
MCV: 105.1 fL — AB (ref 80.0–100.0)
Platelets: 392 10*3/uL (ref 150–440)
RBC: 3.37 MIL/uL — ABNORMAL LOW (ref 3.80–5.20)
RDW: 13 % (ref 11.5–14.5)
WBC: 11.7 10*3/uL — ABNORMAL HIGH (ref 3.6–11.0)

## 2016-01-12 LAB — MRSA PCR SCREENING: MRSA by PCR: NEGATIVE

## 2016-01-12 LAB — PROCALCITONIN: PROCALCITONIN: 18.74 ng/mL

## 2016-01-12 LAB — MAGNESIUM: Magnesium: 1.8 mg/dL (ref 1.7–2.4)

## 2016-01-12 LAB — TROPONIN I

## 2016-01-12 MED ORDER — CETYLPYRIDINIUM CHLORIDE 0.05 % MT LIQD
7.0000 mL | Freq: Two times a day (BID) | OROMUCOSAL | Status: DC
  Administered 2016-01-12: 7 mL via OROMUCOSAL

## 2016-01-12 MED ORDER — BUDESONIDE 0.25 MG/2ML IN SUSP
0.2500 mg | Freq: Two times a day (BID) | RESPIRATORY_TRACT | Status: DC
  Administered 2016-01-12 – 2016-01-15 (×7): 0.25 mg via RESPIRATORY_TRACT
  Filled 2016-01-12 (×7): qty 2

## 2016-01-12 MED ORDER — ENOXAPARIN SODIUM 40 MG/0.4ML ~~LOC~~ SOLN
40.0000 mg | Freq: Every day | SUBCUTANEOUS | Status: DC
  Administered 2016-01-12: 40 mg via SUBCUTANEOUS
  Filled 2016-01-12: qty 0.4

## 2016-01-12 MED ORDER — FAMOTIDINE 20 MG PO TABS
20.0000 mg | ORAL_TABLET | Freq: Two times a day (BID) | ORAL | Status: DC
  Administered 2016-01-12: 20 mg via ORAL
  Filled 2016-01-12: qty 1

## 2016-01-12 MED ORDER — LEVOFLOXACIN IN D5W 500 MG/100ML IV SOLN
500.0000 mg | INTRAVENOUS | Status: DC
  Administered 2016-01-12 – 2016-01-13 (×2): 500 mg via INTRAVENOUS
  Filled 2016-01-12 (×3): qty 100

## 2016-01-12 MED ORDER — ONDANSETRON HCL 4 MG/2ML IJ SOLN: 4.0000 mg | Freq: Four times a day (QID) | INTRAMUSCULAR | Status: DC | PRN

## 2016-01-12 MED ORDER — ENOXAPARIN SODIUM 30 MG/0.3ML ~~LOC~~ SOLN
30.0000 mg | Freq: Every day | SUBCUTANEOUS | Status: DC
  Administered 2016-01-12 – 2016-01-14 (×3): 30 mg via SUBCUTANEOUS
  Filled 2016-01-12 (×3): qty 0.3

## 2016-01-12 MED ORDER — LORATADINE 10 MG PO TABS: 10.0000 mg | ORAL_TABLET | Freq: Every day | ORAL | Status: DC

## 2016-01-12 MED ORDER — ONDANSETRON HCL 4 MG PO TABS: 4.0000 mg | ORAL_TABLET | Freq: Four times a day (QID) | ORAL | Status: DC | PRN

## 2016-01-12 MED ORDER — METHYLPREDNISOLONE SODIUM SUCC 40 MG IJ SOLR
40.0000 mg | Freq: Two times a day (BID) | INTRAMUSCULAR | Status: DC
  Administered 2016-01-12 – 2016-01-15 (×6): 40 mg via INTRAVENOUS
  Filled 2016-01-12 (×6): qty 1

## 2016-01-12 MED ORDER — LACTATED RINGERS IV SOLN
INTRAVENOUS | Status: DC
  Administered 2016-01-12: 09:00:00 via INTRAVENOUS

## 2016-01-12 MED ORDER — METHYLPREDNISOLONE SODIUM SUCC 125 MG IJ SOLR
80.0000 mg | Freq: Once | INTRAMUSCULAR | Status: AC
  Administered 2016-01-12: 80 mg via INTRAVENOUS
  Filled 2016-01-12: qty 2

## 2016-01-12 MED ORDER — ACETAMINOPHEN 325 MG PO TABS
650.0000 mg | ORAL_TABLET | Freq: Four times a day (QID) | ORAL | Status: DC | PRN
  Administered 2016-01-14 (×2): 650 mg via ORAL
  Filled 2016-01-12 (×2): qty 2

## 2016-01-12 MED ORDER — ACETAMINOPHEN 650 MG RE SUPP: 650.0000 mg | Freq: Four times a day (QID) | RECTAL | Status: DC | PRN

## 2016-01-12 MED ORDER — SODIUM CHLORIDE 0.9% FLUSH
3.0000 mL | Freq: Two times a day (BID) | INTRAVENOUS | Status: DC
  Administered 2016-01-12 – 2016-01-15 (×8): 3 mL via INTRAVENOUS

## 2016-01-12 MED ORDER — PANTOPRAZOLE SODIUM 40 MG PO TBEC
40.0000 mg | DELAYED_RELEASE_TABLET | Freq: Every day | ORAL | Status: DC
  Administered 2016-01-12 – 2016-01-14 (×3): 40 mg via ORAL
  Filled 2016-01-12 (×4): qty 1

## 2016-01-12 NOTE — Progress Notes (Signed)
Pt found post neb treatment.  Minimal air movement, and low spo2. 78-83%.  Pt reports SOB.  Pt sitting up on hospital bed. Pollie Meyer MD, who reports to place patient on Bipap.  John RT informed, bipap enroute.

## 2016-01-12 NOTE — Consult Note (Signed)
PULMONARY / CRITICAL CARE MEDICINE   Name: Joy Ball MRN: [id number] DOB: [birth year]    ADMISSION DATE:  01/11/2016   CONSULTATION DATE: 01/11/2016  REFERRING MD: Dr. Emmit Pomfret  CHIEF COMPLAINT:  "I had difficulty breathing and coughing"  HISTORY OF PRESENT ILLNESS:  Joy Ball is 50 year old Caucasian female with a past medical history significant for COPD/Asthma, Migraine headache, GERD,and pulmonary fibrosis who presents with worsening shortness of breath and hypoxia Patient states that During the last month or so, her breathing has gradually gotten worse making it difficult for her to perform her regular activities of daily living. Shortness of breath is worse with exertion, associated with a cough that started about a week ago. Cough is sometimes, hacking and nonproductive and occasionally productive with yellow to green sputum.Symptoms got worse today and she decided to go to the urgent care center for evaluation. At the urgent care center, her chest x-ray suggested pneumonia hence she was referred to the emergency room. Prior to transfer to the ED she was given Solu-Medrol and nebulizer treatments without any significant relief. At the ED, she continued to be dyspneic with significant inspiratory and expiatory wheezes.She was placed on continuous nebulizer treatments and also given Ativan for anxiety. She continued to be hypoxic and dyspneic despite treatment hence PCCM was consulted for further evaluation and treatment. She declined BiPAP and is currently on supplemental oxygen by nasal cannula at 3 L.She reports mild improvement in symptoms. She denies fever, chills, nausea, vomiting, diarrhea, dizziness, headache. She is a current very day smoker   PAST MEDICAL HISTORY :  She  has a past medical history of Seasonal allergies; Migraines; Asthma; GERD (gastroesophageal reflux disease); COPD (chronic obstructive pulmonary disease) (HCC); and Emphysema lung (HCC).  PAST SURGICAL  HISTORY: She  has past surgical history that includes Cesarean section and Tubal ligation.  Allergies  Allergen Reactions  . Amoxicillin Hives    No current facility-administered medications on file prior to encounter.   Current Outpatient Prescriptions on File Prior to Encounter  Medication Sig  . albuterol (VENTOLIN HFA) 108 (90 Base) MCG/ACT inhaler Inhale 2 puffs into the lungs every 6 (six) hours as needed for wheezing or shortness of breath.  Marland Kitchen omeprazole (PRILOSEC) 20 MG capsule Take 20 mg by mouth daily.  . ranitidine (ZANTAC) 150 MG capsule Take 1 capsule (150 mg total) by mouth 2 (two) times daily.  Marland Kitchen dextromethorphan-guaiFENesin (MUCINEX DM) 30-600 MG 12hr tablet Take 1 tablet by mouth 2 (two) times daily.  Marland Kitchen doxycycline (VIBRA-TABS) 100 MG tablet Take 1 tablet (100 mg total) by mouth 2 (two) times daily.  Marland Kitchen ibuprofen (ADVIL,MOTRIN) 800 MG tablet Take 800 mg by mouth as needed.  . loperamide (IMODIUM) 2 MG capsule TAKE 1 CAPSULE BY MOUTH AS NEEDED FOR DIARRHEA OR LOOSE STOOLS  . ondansetron (ZOFRAN) 4 MG tablet Take 1 tablet (4 mg total) by mouth 3 (three) times daily as needed.  . predniSONE (DELTASONE) 20 MG tablet Take 2 tablets (40 mg total) by mouth daily with breakfast. For 5 days  . pseudoephedrine (SUDAFED) 60 MG tablet Take 1 tablet (60 mg total) by mouth every 8 (eight) hours as needed.    FAMILY HISTORY:  Her has no family status information on file.   SOCIAL HISTORY: She  reports that she has been smoking Cigarettes.  She has a 30 pack-year smoking history. She has never used smokeless tobacco. She reports that she drinks alcohol. She reports that she does not use illicit drugs.  REVIEW OF SYSTEMS:   Constitutional: Negative for fever and chills.  HENT: Negative for congestion and rhinorrhea.  Eyes: Negative for redness and visual disturbance.  Respiratory: positive for cough, shortness of breath and wheezing.  Cardiovascular: Negative for chest pain and  palpitations.  Gastrointestinal: Negative  for nausea , vomiting and abdominal pain and  loose stools Genitourinary: Negative for dysuria and urgency.  Endocrine: Denies polyuria, polyphagia and heat intolerance Musculoskeletal: Negative for myalgias and arthralgias.  Skin: Negative for pallor and wound.  Neurological: Negative for dizziness and headaches   SUBJECTIVE:   VITAL SIGNS: BP 98/71 mmHg  Pulse 112  Temp(Src) 98.1 F (36.7 C) (Oral)  Resp 29  Ht 5\' 1"  (1.549 m)  Wt 89 lb (40.37 kg)  BMI 16.83 kg/m2  SpO2 97%  HEMODYNAMICS:    VENTILATOR SETTINGS:    INTAKE / OUTPUT:    PHYSICAL EXAMINATION: General:Cachectic, mild respiratory distress Neuro: Alert and oriented 4, no focal deficits HEENT: PERRLA, oral mucosa moist, gag reflex intact Cardiovascular: rate and rhythm regular, S1, S2 audible, no murmur, regurg or gallop Lungs: Mild increase in work of breathing, bilateral airflow, breath sounds diminished in the bases, mild expiratory wheezes, no rhonchi Abdomen:  Non distended, normal bowel sounds Musculoskeletal:  Positive range of motion in upper and lower extremities Extremities:+2 pulses bilaterally, no cyanosis Skin:  Warm and dry  LABS:  BMET  Recent Labs Lab 01/11/16 1939  NA 139  K 3.0*  CL 108  CO2 19*  BUN 9  CREATININE 0.64  GLUCOSE 98    Electrolytes  Recent Labs Lab 01/11/16 1939  CALCIUM 8.9    CBC  Recent Labs Lab 01/11/16 1939  WBC 20.4*  HGB 13.5  HCT 39.3  PLT 442*    Coag's No results for input(s): APTT, INR in the last 168 hours.  Sepsis Markers No results for input(s): LATICACIDVEN, PROCALCITON, O2SATVEN in the last 168 hours.  ABG No results for input(s): PHART, PCO2ART, PO2ART in the last 168 hours.  Liver Enzymes No results for input(s): AST, ALT, ALKPHOS, BILITOT, ALBUMIN in the last 168 hours.  Cardiac Enzymes  Recent Labs Lab 01/11/16 1939 01/11/16 2155  TROPONINI 0.07* <0.03     Glucose No results for input(s): GLUCAP in the last 168 hours.  Imaging Dg Chest 2 View  01/11/2016  CLINICAL DATA:  Cough, shortness of breath and aching for 3-4 days. History of asthma and COPD. EXAM: CHEST  2 VIEW COMPARISON:  Chest x-ray from earlier same day, chest x-ray dated 12/17/2011 and chest CT dated 05/04/2012. FINDINGS: Bibasilar airspace opacities are slightly increased and size. Upper lungs remain clear. Lungs appear hyperexpanded with emphysematous changes at each lung apex. No pleural effusion or pneumothorax seen. Cardiomediastinal silhouette is stable. Osseous structures about the chest are unremarkable. IMPRESSION: Persistent bibasilar airspace opacities, slightly increased compared to the chest x-ray performed earlier today, again compatible with bibasilar pneumonia. COPD/emphysema. Electronically Signed   By: Bary Richard M.D.   On: 01/11/2016 20:16   Dg Chest 2 View  01/11/2016  CLINICAL DATA:  Cough, 5 days duration. History of pulmonary fibrosis. EXAM: CHEST  2 VIEW COMPARISON:  01/06/2012 FINDINGS: Heart size is normal. Mediastinal shadows are normal. The patient has advanced chronic emphysema. There are acute infiltrates in both lower lungs consistent with bilateral bronchopneumonia. No lobar collapse or effusion. No acute bone finding. IMPRESSION: Background pattern of severe emphysema. Bilateral lower lobe bronchopneumonia. Electronically Signed   By: Scherrie Bateman.D.  On: 01/11/2016 18:56    STUDIES:  None  CULTURES: MRSA screen negative Blood cultures 01/11/2016  ANTIBIOTICS: Levaquin 750mg  07/21>>  SIGNIFICANT EVENTS: 01/11/16: ED with dyspnea, admitted with sepsis, CAP and acute respiratory failure  LINES/TUBES: PIVs  DISCUSSION: 50 YO female with a h/o pulmonary fibrosis, COPD/ASTHMA presenting with sepsis, CAP and acute respiratory failure   ASSESSMENT / PLAN:  PULMONARY A: Bilateral lower lobe pneumonia Pulmonary fibrosis Acute COPD  exacerbation 2/2 CAP.  Cough P:   -Daily CXR -nebulized bronchodilators -Antibiotics as above.. -Inhaled steroids. -ABG when necessary -When necessary BiPAP and supplemental oxygen by nasal cannula as tolerated -Robitussin with codeine as needed -Smoking cessation advice given  CARDIOVASCULAR A:  Tachycardia Mild hypotension P:  -IV fluids. -Hemodynamic monitoring per ICU protocol -Cycle troponins  RENAL A:   Hypokalemia P:   Monitor and replace electrolytes  GASTROINTESTINAL A:   History of GERD P:   -Continue Pepcid  twice a day  INFECTIOUS A:   Leukocytosis-wbc trending down. CAP Sepsis P:   -F/U CULTURES -Monitor for fever and croup reculture if febrile Antibiotics as above   Neuro A:   Anxiety P:   Prn lorazepam   DISPOSITION AND FAMILY UPDATE  Best Practice: Code Status: Full CODE Diet: Regular GI prophylaxis: n/a -VTE prophylaxis: SCD's / lovenox    [facility] ANP-BC Pulmonary and Critical Care Medicine [facility] Pager [phone] or [phone] 01/12/2016, 3:29 AM   PCCM ATTENDING ATTESTATION:  I have evaluated patient with ANP Luci Bank, reviewed database in its entirety and discussed care plan in detail. In addition, this patient was discussed on multidisciplinary rounds.   Important exam findings: No overt distress @ rest Diffusely deceased BS, no wheezes Bibasilar crackles RRR s M NABS, soft No C/C/E  Major problems addressed by PCCM team: Acute on chronic hypoxic respiratory failure Severe emphysema H/O pulmonary fibrosis, NOS Bilateral pulmonary infiltrates Elevated procalcitonin Smoker  PLAN/REC: Cont PRN BiPAP Cont supplemental O2 Cont systemic steroids Cont nebulized steroids and bronchodilators Cont empiric antibiotics Discussed need for smoking cessation Cont SDU level of care Follow CXR for clearing  Billy Fischer, MD PCCM service Mobile [phone] Pager [phone]

## 2016-01-12 NOTE — Progress Notes (Signed)
PHARMACIST - PHYSICIAN COMMUNICATION  CONCERNING:  Enoxaparin (Lovenox) for DVT Prophylaxis    RECOMMENDATION: Patient was prescribed enoxaprin 40mg  q24 hours for VTE prophylaxis.   Filed Weights   01/11/16 1934  Weight: 89 lb (40.37 kg)    Body mass index is 16.83 kg/(m^2).  Estimated Creatinine Clearance: 54.8 mL/min (by C-G formula based on Cr of 0.45).   Based on [facility] policy patient is candidate for enoxaparin 30mg  q24 hour dosing due to Weight <45kg.  DESCRIPTION: Pharmacy has adjusted enoxaparin dose per [facility] policy.  Patient is now receiving enoxaparin 30mg  q24 hours.   Cher Nakai, PharmD Clinical Pharmacist  01/12/2016 5:32 PM

## 2016-01-12 NOTE — Progress Notes (Signed)
Initial Nutrition Assessment  DOCUMENTATION CODES:   Underweight, Severe malnutrition in context of chronic illness  INTERVENTION:  -Discussed smaller, more frequent meals to maximize nutritional intake. Pt agreeable to small snacks between meals -Recommend addition of MVI -Pt with poor dentition, unable to chew tough meats, raw veggies like salad, celery sticks; pt may benefit from downgrading diet to Dysphagia III but will continue Regular diet for now as pt capable of cutting up meats, room service appropriate and can pick food items at meal times that she is able to tolerate best -Reinforced the importance of adequate nutrition, discussed ways to increase calories and protein in diet, discussed nutritent, calorie dense foods -Discussed nutritional supplement, pt reports she does not like Ensure/Boost, [employer] Breakfast, etc; declined nutritional supplement at this time  NUTRITION DIAGNOSIS:   Malnutrition related to chronic illness as evidenced by severe depletion of muscle mass, severe depletion of body fat, energy intake < 75% for > or equal to 1 month.  GOAL:   Patient will meet greater than or equal to 90% of their needs  MONITOR:   PO intake, Labs, Weight trends  REASON FOR ASSESSMENT:   Malnutrition Screening Tool (Underweight/LOW BMI)    ASSESSMENT:    49 yo female admitted with worsening SOB over the past month with sepsis due to pneumonia, COPD exacerbation  Pt currently on BiPap this AM but able to participate in exam  Pt reports she typically only eats 1 meal per day, usually dinner meal of meat with starch and veggies but typically does not eat all of what is on her plate; pt reports she does not snack much but sometimes she does. Pt reports this has been her eating pattern for a long time. She does not use nutritional supplements such as Ensure or Boost.  Pt does not take a MVI. Pt reports weight has recently been stable, although noted that several  years ago pt weighed over 100 pounds.   Pt with poor dentition, only had 4 top teeth. Pt reports she is able to chew most foods, meats have to be chopped up, cant tolerate salads, celery sticks, etc. No N/V, no difficulty swallowing  Nutrition-Focused physical exam completed. Findings are mild to severe fat depletion, mild to severe muscle depletion, and no edema.     Past Medical History  Diagnosis Date  . Seasonal allergies   . Migraines   . Asthma   . GERD (gastroesophageal reflux disease)   . COPD (chronic obstructive pulmonary disease) (HCC)   . Emphysema lung (HCC)      Diet Order:  Diet regular Room service appropriate?: Yes; Fluid consistency:: Thin  Skin:  Reviewed, no issues  Last BM:  7/21   Labs: reviewed  Meds: LR at 50 ml/hr, solumedrol  Height:   Ht Readings from Last 1 Encounters:  01/11/16 5\' 1"  (1.549 m)    Weight:   Wt Readings from Last 1 Encounters:  01/11/16 89 lb (40.37 kg)    Ideal Body Weight:  47.7 kg  BMI:  Body mass index is 16.83 kg/(m^2).  Estimated Nutritional Needs:   Kcal:  1400-1700 kcals   Protein:  70-85 g  Fluid:  >/= 1.5 L  EDUCATION NEEDS:   Education needs addressed  Romelle Starcher MS, RD, LDN [phone] Pager  [phone] Weekend/On-Call Pager

## 2016-01-13 ENCOUNTER — Inpatient Hospital Stay: Payer: BLUE CROSS/BLUE SHIELD

## 2016-01-13 DIAGNOSIS — J8410 Pulmonary fibrosis, unspecified: Secondary | ICD-10-CM | POA: Diagnosis not present

## 2016-01-13 DIAGNOSIS — J439 Emphysema, unspecified: Secondary | ICD-10-CM | POA: Diagnosis not present

## 2016-01-13 DIAGNOSIS — J189 Pneumonia, unspecified organism: Secondary | ICD-10-CM

## 2016-01-13 DIAGNOSIS — Z720 Tobacco use: Secondary | ICD-10-CM

## 2016-01-13 DIAGNOSIS — E43 Unspecified severe protein-calorie malnutrition: Secondary | ICD-10-CM | POA: Insufficient documentation

## 2016-01-13 LAB — CBC
HCT: 34.4 % — ABNORMAL LOW (ref 35.0–47.0)
HEMOGLOBIN: 11.9 g/dL — AB (ref 12.0–16.0)
MCH: 36.2 pg — ABNORMAL HIGH (ref 26.0–34.0)
MCHC: 34.7 g/dL (ref 32.0–36.0)
MCV: 104.3 fL — ABNORMAL HIGH (ref 80.0–100.0)
Platelets: 396 10*3/uL (ref 150–440)
RBC: 3.3 MIL/uL — ABNORMAL LOW (ref 3.80–5.20)
RDW: 13 % (ref 11.5–14.5)
WBC: 11 10*3/uL (ref 3.6–11.0)

## 2016-01-13 LAB — BASIC METABOLIC PANEL
Anion gap: 5 (ref 5–15)
BUN: 10 mg/dL (ref 6–20)
CALCIUM: 8.7 mg/dL — AB (ref 8.9–10.3)
CHLORIDE: 108 mmol/L (ref 101–111)
CO2: 27 mmol/L (ref 22–32)
CREATININE: 0.41 mg/dL — AB (ref 0.44–1.00)
GFR calc non Af Amer: >60 mL/min (ref >60)
Glucose, Bld: 118 mg/dL — ABNORMAL HIGH (ref 65–99)
Potassium: 4.2 mmol/L (ref 3.5–5.1)
SODIUM: 140 mmol/L (ref 135–145)

## 2016-01-13 LAB — PROCALCITONIN: PROCALCITONIN: 13.04 ng/mL

## 2016-01-13 MED ORDER — IPRATROPIUM-ALBUTEROL 0.5-2.5 (3) MG/3ML IN SOLN
3.0000 mL | Freq: Four times a day (QID) | RESPIRATORY_TRACT | Status: DC
  Administered 2016-01-13 – 2016-01-15 (×9): 3 mL via RESPIRATORY_TRACT
  Filled 2016-01-13 (×9): qty 3

## 2016-01-13 NOTE — Progress Notes (Signed)
No new complaints. Rattling but NP cough. Desaturates significantly with minimal exertion.  Denies CP, fever, purulent sputum, hemoptysis, LE edema and calf tenderness. Wore BiPAP last night  Vitals:   01/13/16 0400 01/13/16 0500 01/13/16 0600 01/13/16 0714  BP: (!) 86/65 (!) 83/60 93/79   Pulse: (!) 55 (!) 51 85   Resp: 19 (!) 27 (!) 25   Temp:      TempSrc:      SpO2: 95% 94% 92% 94%  Weight:      Height:       [place] 3 lpm NAD HEENT WNL No JVD Diffusely diminished BS, no wheezes, bilateral crackles RRR s M Abd soft, NT, +BS No LE edema No focal neuro deficits  BMP Latest Ref Rng & Units 01/13/2016 01/12/2016 01/11/2016  Glucose 65 - 99 mg/dL 672(C) 947(S) 98  BUN 6 - 20 mg/dL 10 7 9   Creatinine 0.44 - 1.00 mg/dL 9.62(E) 3.66 2.94  Sodium 135 - 145 mmol/L 140 138 139  Potassium 3.5 - 5.1 mmol/L 4.2 3.9 3.0(L)  Chloride 101 - 111 mmol/L 108 110 108  CO2 22 - 32 mmol/L 27 24 19(L)  Calcium 8.9 - 10.3 mg/dL 7.6(L) 4.6(T) 8.9   CBC Latest Ref Rng & Units 01/13/2016 01/12/2016 01/11/2016  WBC 3.6 - 11.0 K/uL 11.0 11.7(H) 20.4(H)  Hemoglobin 12.0 - 16.0 g/dL 11.9(L) 12.0 13.5  Hematocrit 35.0 - 47.0 % 34.4(L) 35.4 39.3  Platelets 150 - 440 K/uL 396 392 442(H)   PCT: 18.74 > 13.04 >   CXR: bilateral infiltrates persist but appear slightly improved  IMPRESSION: Acute on chronic hypoxic respiratory failure Severe emphysema H/O pulmonary fibrosis, NOS Bilateral pulmonary infiltrates, suspect PNA Elevated procalcitonin Smoker  PLAN/REC: Continue SDU status Cont PRN BiPAP Cont supplemental O2 Cont systemic steroids @ current dose  Cont ebulized steroids and bronchodilators Cont empiric antibiotics - levofloxacin day 2/xx Follow CXR intermittently PCT algorithm Counseled re: smoking cessation  Billy Fischer, MD PCCM service Mobile [phone] Pager [phone] 01/13/2016

## 2016-01-14 ENCOUNTER — Inpatient Hospital Stay: Payer: BLUE CROSS/BLUE SHIELD

## 2016-01-14 DIAGNOSIS — J189 Pneumonia, unspecified organism: Secondary | ICD-10-CM | POA: Diagnosis not present

## 2016-01-14 DIAGNOSIS — E43 Unspecified severe protein-calorie malnutrition: Secondary | ICD-10-CM

## 2016-01-14 DIAGNOSIS — A419 Sepsis, unspecified organism: Principal | ICD-10-CM

## 2016-01-14 LAB — CBC
HEMATOCRIT: 35.4 % (ref 35.0–47.0)
Hemoglobin: 12.2 g/dL (ref 12.0–16.0)
MCH: 36.4 pg — AB (ref 26.0–34.0)
MCHC: 34.6 g/dL (ref 32.0–36.0)
MCV: 105.2 fL — ABNORMAL HIGH (ref 80.0–100.0)
Platelets: 405 10*3/uL (ref 150–440)
RBC: 3.36 MIL/uL — AB (ref 3.80–5.20)
RDW: 12.7 % (ref 11.5–14.5)
WBC: 8.5 10*3/uL (ref 3.6–11.0)

## 2016-01-14 LAB — BLOOD GAS, VENOUS
ACID-BASE DEFICIT: 1.1 mmol/L (ref 0.0–2.0)
BICARBONATE: 22.6 meq/L (ref 21.0–28.0)
O2 SAT: 81.2 %
PCO2 VEN: 34 mmHg — AB (ref 44.0–60.0)
PO2 VEN: 44 mmHg (ref 31.0–45.0)
Patient temperature: 37
pH, Ven: 7.43 (ref 7.320–7.430)

## 2016-01-14 LAB — PROCALCITONIN: PROCALCITONIN: 4.64 ng/mL

## 2016-01-14 MED ORDER — DOCUSATE SODIUM 100 MG PO CAPS
100.0000 mg | ORAL_CAPSULE | Freq: Two times a day (BID) | ORAL | Status: DC
  Administered 2016-01-14 – 2016-01-15 (×3): 100 mg via ORAL
  Filled 2016-01-14 (×3): qty 1

## 2016-01-14 MED ORDER — LEVOFLOXACIN 500 MG PO TABS
500.0000 mg | ORAL_TABLET | Freq: Every day | ORAL | Status: DC
  Administered 2016-01-14 – 2016-01-15 (×2): 500 mg via ORAL
  Filled 2016-01-14 (×2): qty 1

## 2016-01-14 NOTE — Progress Notes (Signed)
PULMONARY / CRITICAL CARE MEDICINE   Name: Joy Ball MRN: [id number] DOB: Aug 16, 1965    ADMISSION DATE:  01/11/2016   DISCUSSION: 50 YO female smoker with a h/o pulmonary fibrosis, COPD/ASTHMA presenting with sepsis, CAP and acute respiratory failure.   ASSESSMENT / PLAN:  PULMONARY A: Acute on chronic hypoxic respiratory failure Severe emphysema H/O pulmonary fibrosis, NOS Bilateral pulmonary infiltrates Cough P:   -review of today's chest x-ray films shows increased bibasilar atelectasis, increased evidence of pneumonitis in the bases. Per report, there is a greater than 1 cm nodule in the left mid zone, however, on my review, this is not seen on previous chest x-ray films. We'll continue serial chest x-rays to look at this potential nodule -nebulized bronchodilators -Antibiotics as above.. -Inhaled steroids. -ABG when necessary -Will wean from Bipap. Nasal cannula as tolerated. -Robitussin with codeine as needed -Smoking cessation advice given  CARDIOVASCULAR A:  Tachycardia-resolved Mild hypotension P:  -Hemodynamic monitoring per ICU protocol -IV fluid   RENAL A:   Hypokalemia-resolved P:   Monitor and replace electrolytes  GASTROINTESTINAL A:   History of GERD P:   -Continue Pepcid  twice a day  INFECTIOUS A:   Leukocytosis-resolved; cultures NTD CAP Sepsis P:   -Monitor for fever and croup reculture if febrile Antibiotics -Levaquin to continue for 7 days.  Procalcitonin 7/22 18.74>> 13.04>> 4.64 on 7/24  CULTURES: MRSA screen negative Blood cultures 01/11/2016: Negative thus far  ANTIBIOTICS: Levaquin 500mg  07/21>>   Neuro A:   Anxiety P:   Prn lorazepam  SIGNIFICANT EVENTS: 01/11/16: ED with dyspnea, admitted with sepsis, CAP and acute respiratory failure  LINES/TUBES:   DISPOSITION AND FAMILY UPDATE : No family at bedside. Patient updated on current treatment plan.   Best Practice: Code Status: Full CODE Diet: Regular GI  prophylaxis: n/a -VTE prophylaxis: SCD's / lovenox  -----------------------------------------------   SUBJECTIVE: Persistent non-productive cough and still desaturates with minimal exertion. Tolerated BiPAP overnight. Oral intake poor  Allergies  Allergen Reactions  . Amoxicillin Hives    No current facility-administered medications on file prior to encounter.    Current Outpatient Prescriptions on File Prior to Encounter  Medication Sig  . albuterol (VENTOLIN HFA) 108 (90 Base) MCG/ACT inhaler Inhale 2 puffs into the lungs every 6 (six) hours as needed for wheezing or shortness of breath.  Marland Kitchen omeprazole (PRILOSEC) 20 MG capsule Take 20 mg by mouth daily.  . ranitidine (ZANTAC) 150 MG capsule Take 1 capsule (150 mg total) by mouth 2 (two) times daily.  Marland Kitchen dextromethorphan-guaiFENesin (MUCINEX DM) 30-600 MG 12hr tablet Take 1 tablet by mouth 2 (two) times daily.  Marland Kitchen doxycycline (VIBRA-TABS) 100 MG tablet Take 1 tablet (100 mg total) by mouth 2 (two) times daily.  Marland Kitchen ibuprofen (ADVIL,MOTRIN) 800 MG tablet Take 800 mg by mouth as needed.  . loperamide (IMODIUM) 2 MG capsule TAKE 1 CAPSULE BY MOUTH AS NEEDED FOR DIARRHEA OR LOOSE STOOLS  . ondansetron (ZOFRAN) 4 MG tablet Take 1 tablet (4 mg total) by mouth 3 (three) times daily as needed.  . predniSONE (DELTASONE) 20 MG tablet Take 2 tablets (40 mg total) by mouth daily with breakfast. For 5 days  . pseudoephedrine (SUDAFED) 60 MG tablet Take 1 tablet (60 mg total) by mouth every 8 (eight) hours as needed.   REVIEW OF SYSTEMS:   Constitutional: Negative for fever and chills.  HENT: Negative for congestion and rhinorrhea.  Eyes: Negative for redness and visual disturbance.  Respiratory: positive for cough, shortness of  breath and wheezing.  Cardiovascular: Negative for chest pain and palpitations.  Gastrointestinal: Negative  for nausea , vomiting and abdominal pain and  loose stools Genitourinary: Negative for dysuria and urgency.   Endocrine: Denies polyuria, polyphagia and heat intolerance Musculoskeletal: Negative for myalgias and arthralgias.  Skin: Negative for pallor and wound.  Neurological: Negative for dizziness and headaches    VITAL SIGNS: BP 90/65   Pulse (!) 56   Temp 97.6 F (36.4 C) (Axillary)   Resp (!) 22   Ht 5\' 1"  (1.549 m)   Wt 89 lb (40.4 kg)   SpO2 96%   BMI 16.82 kg/m   HEMODYNAMICS:    VENTILATOR SETTINGS: FiO2 (%):  [28 %] 28 %  INTAKE / OUTPUT: I/O last 3 completed shifts: In: 2150.8 [P.O.:1080; I.V.:1070.8] Out: 900 [Urine:900]  PHYSICAL EXAMINATION: General: Cachectic, mild respiratory distress Neuro: Alert and oriented 4, no focal deficits HEENT: PERRLA, oral mucosa moist, gag reflex intact Cardiovascular: rate and rhythm regular, S1, S2 audible, no murmur, regurg or gallop Lungs: Normal work of breathing, bilateral airflow, breath sounds diminished in the bases, mild expiratory wheezes, no rhonchi, scattered crackles in the bases.  Abdomen:  Non distended, normal bowel sounds Musculoskeletal:  Positive range of motion in upper and lower extremities Extremities:+2 pulses bilaterally, no cyanosis Skin:  Warm and dry  LABS:  BMET  Recent Labs Lab 01/11/16 1939 01/12/16 0446 01/13/16 0526  NA 139 138 140  K 3.0* 3.9 4.2  CL 108 110 108  CO2 19* 24 27  BUN 9 7 10   CREATININE 0.64 0.45 0.41*  GLUCOSE 98 130* 118*    Electrolytes  Recent Labs Lab 01/11/16 1939 01/12/16 0446 01/13/16 0526  CALCIUM 8.9 8.4* 8.7*  MG  --  1.8  --   PHOS  --  3.1  --     CBC  Recent Labs Lab 01/12/16 0446 01/13/16 0526 01/14/16 0420  WBC 11.7* 11.0 8.5  HGB 12.0 11.9* 12.2  HCT 35.4 34.4* 35.4  PLT 392 396 405    Coag's No results for input(s): APTT, INR in the last 168 hours.  Sepsis Markers  Recent Labs Lab 01/12/16 0446 01/13/16 0526 01/14/16 0420  PROCALCITON 18.74 13.04 4.64    ABG No results for input(s): PHART, PCO2ART, PO2ART in the  last 168 hours.  Liver Enzymes  Recent Labs Lab 01/12/16 0446  AST 23  ALT 11*  ALKPHOS 94  BILITOT 0.6  ALBUMIN 2.9*    Cardiac Enzymes  Recent Labs Lab 01/11/16 1939 01/11/16 2155 01/12/16 0446  TROPONINI 0.07* <0.03 <0.03    Glucose No results for input(s): GLUCAP in the last 168 hours.  Imaging No results found.  STUDIES:  None    Magdalene S. [facility] ANP-BC Pulmonary and Critical Care Medicine [facility] Pager [phone] or [phone] 01/14/2016, 7:11 AM   Pt. Seen and examined with NP, agree with the assessment and plan as above. 50 YO female smoker with a h/o pulmonary fibrosis, COPD/ASTHMA presenting with sepsis, CAP and acute respiratory failure. Patient tolerated BiPAP overnight, her lungs are improved. On auscultation, she tells me she is feeling somewhat better today compared to yesterday.  Wells Guiles M.D. 01/14/2016   Critical Care Attestation.  I have personally obtained a history, examined the patient, evaluated laboratory and imaging results, formulated the assessment and plan and placed orders. The Patient requires high complexity decision making for assessment and support, frequent evaluation and titration of therapies, application of advanced monitoring technologies and extensive  interpretation of multiple databases. The patient has critical illness that could lead imminently to failure of 1 or more organ systems and requires the highest level of physician preparedness to intervene.  Critical Care Time devoted to patient care services described in this note is 35 minutes and is exclusive of time spent in procedures.

## 2016-01-14 NOTE — Plan of Care (Signed)
Problem: Health Behavior/Discharge Planning: Goal: Ability to manage health-related needs will improve Outcome: Progressing Patient to be transferred to room 221. Report called to Ree Kida RN on 2C. Patient aware of transfer and declined for RN to call anyone on her behalf.  Problem: Physical Regulation: Goal: Ability to maintain clinical measurements within normal limits will improve Outcome: Progressing Patient with no respiratory distress during shift. O2 titrated down to 2 L nasal cannula. Patient did have a large amount of coughing towards beginning of shift when removed from bipap (almost 2 minutes of continuous coughing), relived by prn cough medicine. Patient would have small bouts of coughing throughout rest of shift and requested cough medicine roughly every four hours (when available).  Problem: Activity: Goal: Risk for activity intolerance will decrease Outcome: Progressing Up to chair with no distress today, O2 sats did drop to upper 80s but recovered after a minute or so. See vital sign flowsheets.  Problem: Nutrition: Goal: Adequate nutrition will be maintained Outcome: Progressing Patient with good appetite today, eating 100% of meals  Problem: Bowel/Gastric: Goal: Will not experience complications related to bowel motility Outcome: Progressing Patient started on colace since had not had a bowel movement since 01/11/16.

## 2016-01-14 NOTE — Care Management (Signed)
Met with patient who is sitting in bedside recliner independently with feet pulled up into chair seat. She states she lives alone in a new apartment in [place]. Her PCP is Dr. Luan Pulling of [facility]. She denies difficulty obtaining medications. She states she drives and independent with daily activity. O2 is acute. RNCM will continue to follow.

## 2016-01-14 NOTE — Progress Notes (Signed)
PHARMACIST - PHYSICIAN COMMUNICATION DR:   Nicholos Johns CONCERNING: Antibiotic IV to Oral Route Change Policy  RECOMMENDATION: This patient is receiving Levaquin by the intravenous route.  Based on criteria approved by the Pharmacy and Therapeutics Committee, the antibiotic(s) is/are being converted to the equivalent oral dose form(s).   DESCRIPTION: These criteria include:  Patient being treated for a respiratory tract infection, urinary tract infection, cellulitis or clostridium difficile associated diarrhea if on metronidazole  The patient is not neutropenic and does not exhibit a GI malabsorption state  The patient is eating (either orally or via tube) and/or has been taking other orally administered medications for a least 24 hours  The patient is improving clinically and has a Tmax < 100.5  If you have questions about this conversion, please contact the Pharmacy Department  []   [phone] )  [facility] [x]   [phone] )  [facility] []   [phone] )  [facility] []   [phone] )  [facility] []   [phone] )  Ilene Qua   Luisa Hart, PharmD Clinical Pharmacist

## 2016-01-14 NOTE — Progress Notes (Signed)
Accepted pt from ICU. D/w Dr Nicholos Johns.  Patient on nocturnal BiPAP per attending and otherwise weaned down to 2-3 liters via [place]. On steroids as wheezy.  We'll resume care at 7 am tomorrow.

## 2016-01-15 ENCOUNTER — Inpatient Hospital Stay: Payer: BLUE CROSS/BLUE SHIELD

## 2016-01-15 ENCOUNTER — Telehealth: Payer: Self-pay | Admitting: *Deleted

## 2016-01-15 DIAGNOSIS — A419 Sepsis, unspecified organism: Secondary | ICD-10-CM | POA: Diagnosis not present

## 2016-01-15 DIAGNOSIS — J189 Pneumonia, unspecified organism: Secondary | ICD-10-CM

## 2016-01-15 LAB — BASIC METABOLIC PANEL
Anion gap: 8 (ref 5–15)
BUN: 9 mg/dL (ref 6–20)
CHLORIDE: 105 mmol/L (ref 101–111)
CO2: 25 mmol/L (ref 22–32)
CREATININE: 0.46 mg/dL (ref 0.44–1.00)
Calcium: 8.5 mg/dL — ABNORMAL LOW (ref 8.9–10.3)
GFR calc Af Amer: >60 mL/min (ref >60)
GFR calc non Af Amer: >60 mL/min (ref >60)
Glucose, Bld: 157 mg/dL — ABNORMAL HIGH (ref 65–99)
Potassium: 3.4 mmol/L — ABNORMAL LOW (ref 3.5–5.1)
Sodium: 138 mmol/L (ref 135–145)

## 2016-01-15 LAB — CBC
HEMATOCRIT: 36.9 % (ref 35.0–47.0)
HEMOGLOBIN: 12.8 g/dL (ref 12.0–16.0)
MCH: 36.3 pg — AB (ref 26.0–34.0)
MCHC: 34.6 g/dL (ref 32.0–36.0)
MCV: 105 fL — ABNORMAL HIGH (ref 80.0–100.0)
Platelets: 449 10*3/uL — ABNORMAL HIGH (ref 150–440)
RBC: 3.52 MIL/uL — ABNORMAL LOW (ref 3.80–5.20)
RDW: 12.9 % (ref 11.5–14.5)
WBC: 7.7 10*3/uL (ref 3.6–11.0)

## 2016-01-15 LAB — GLUCOSE, CAPILLARY
Glucose-Capillary: 105 mg/dL — ABNORMAL HIGH (ref 65–99)
Glucose-Capillary: 164 mg/dL — ABNORMAL HIGH (ref 65–99)

## 2016-01-15 MED ORDER — PREDNISONE 10 MG PO TABS: 50.0000 mg | ORAL_TABLET | Freq: Every day | ORAL | 0 refills | Status: DC

## 2016-01-15 MED ORDER — BUDESONIDE 0.25 MG/2ML IN SUSP: 0.2500 mg | Freq: Two times a day (BID) | RESPIRATORY_TRACT | 12 refills | Status: DC

## 2016-01-15 MED ORDER — LEVOFLOXACIN 500 MG PO TABS: 500.0000 mg | ORAL_TABLET | Freq: Every day | ORAL | 0 refills | Status: DC

## 2016-01-15 MED ORDER — GUAIFENESIN-CODEINE 100-10 MG/5ML PO SOLN: 5.0000 mL | Freq: Two times a day (BID) | ORAL | 0 refills | Status: DC | PRN

## 2016-01-15 MED ORDER — IPRATROPIUM-ALBUTEROL 0.5-2.5 (3) MG/3ML IN SOLN: 3.0000 mL | Freq: Four times a day (QID) | RESPIRATORY_TRACT | Status: DC | PRN

## 2016-01-15 MED ORDER — PREDNISONE 20 MG PO TABS: 50.0000 mg | ORAL_TABLET | Freq: Every day | ORAL | Status: DC

## 2016-01-15 NOTE — Discharge Instructions (Signed)
STOP smoking

## 2016-01-15 NOTE — Care Management Note (Signed)
Case Management Note  Patient Details  Name: Joy Ball MRN: [id number] Date of Birth: 1966-05-08  Subjective/Objective:    Discharge home. No home health services ordered.                 Action/Plan:   Expected Discharge Date:                  Expected Discharge Plan:     In-House Referral:     Discharge planning Services     Post Acute Care Choice:    Choice offered to:     DME Arranged:    DME Agency:     HH Arranged:    HH Agency:     Status of Service:     If discussed at [employer] of Stay Meetings, dates discussed:    Additional Comments:  [name] A, RN 01/15/2016, 12:48 PM

## 2016-01-15 NOTE — Discharge Summary (Signed)
[facility] - [facility]   PATIENT NAME: Joy Ball    MR#:  [id number]  DATE OF BIRTH:  06-Jan-1966  DATE OF ADMISSION:  01/11/2016 ADMITTING PHYSICIAN: Enid Baas, MD  DATE OF DISCHARGE: 01/15/16  PRIMARY CARE PHYSICIAN: Fidel Levy, MD    ADMISSION DIAGNOSIS:  Community acquired pneumonia [J18.9] Chronic obstructive pulmonary disease with acute exacerbation (HCC) [J44.1]  DISCHARGE DIAGNOSIS:  Acute On chronic respiratory failure secondary to COPD exacerbation Community acquired pneumonia Ongoing tobacco abuse  SECONDARY DIAGNOSIS:   Past Medical History:  Diagnosis Date  . Asthma   . COPD (chronic obstructive pulmonary disease) (HCC)   . Emphysema lung (HCC)   . GERD (gastroesophageal reflux disease)   . Migraines   . Seasonal allergies     HOSPITAL COURSE:  50 year old white female with history of COPD, asthma, pulmonary fibrosis, pulmonary nodules, GERD, seasonal allergies now presenting with 1. Sepsis secondary to CAP with COPD exacerbation -BC negative -pt was transferred out of ICU. Doing well -change to oral Levaquin and steroid taper. -Continue oral inhalers. Patient advised smoking cessation - sats 91-92% on room air and ambulation.  2. Elevate troponin without chest pain -Resolved  3. Hypokalemia  - Replace orally.  4. GERD, Seasonal allergies - Continue home medications  5. Tobacco use disorder - cessation advised.  Nicotine patch for replacement therapy.   6. Routine DVT prophylaxis with Lovenox and SCDs.   Overall improved. Patient will discharge to home today. She'll follow up with pulmonary as outpatient. CONSULTS OBTAINED:  Treatment Team:  Merwyn Katos, MD  DRUG ALLERGIES:   Allergies  Allergen Reactions  . Amoxicillin Hives    DISCHARGE MEDICATIONS:   Current Discharge Medication List    START taking these medications   Details  budesonide (PULMICORT) 0.25 MG/2ML nebulizer  solution Take 2 mLs (0.25 mg total) by nebulization every 12 (twelve) hours. Qty: 60 mL, Refills: 12    levofloxacin (LEVAQUIN) 500 MG tablet Take 1 tablet (500 mg total) by mouth daily. Qty: 2 tablet, Refills: 0      CONTINUE these medications which have CHANGED   Details  predniSONE (DELTASONE) 10 MG tablet Take 5 tablets (50 mg total) by mouth daily with breakfast. Taper by 10 mg daily then stop Qty: 15 tablet, Refills: 0      CONTINUE these medications which have NOT CHANGED   Details  acetaminophen (TYLENOL) 500 MG tablet Take 1,500 mg by mouth 3 (three) times daily. 2 to 3 times a day.    albuterol (VENTOLIN HFA) 108 (90 Base) MCG/ACT inhaler Inhale 2 puffs into the lungs every 6 (six) hours as needed for wheezing or shortness of breath. Qty: 18 g, Refills: 3    dextromethorphan-guaiFENesin (ROBITUSSIN-DM) 10-100 MG/5ML liquid Take 10 mLs by mouth 3 (three) times daily.    loratadine (CLARITIN) 10 MG tablet Take 10 mg by mouth daily.    omeprazole (PRILOSEC) 20 MG capsule Take 20 mg by mouth daily. Refills: 6    ranitidine (ZANTAC) 150 MG capsule Take 1 capsule (150 mg total) by mouth 2 (two) times daily. Qty: 60 capsule, Refills: 11   Associated Diagnoses: RUQ abdominal pain    dextromethorphan-guaiFENesin (MUCINEX DM) 30-600 MG 12hr tablet Take 1 tablet by mouth 2 (two) times daily. Qty: 20 tablet, Refills: 0   Associated Diagnoses: COPD with exacerbation (HCC)    ibuprofen (ADVIL,MOTRIN) 800 MG tablet Take 800 mg by mouth as needed.    loperamide (  IMODIUM) 2 MG capsule TAKE 1 CAPSULE BY MOUTH AS NEEDED FOR DIARRHEA OR LOOSE STOOLS Qty: 30 capsule, Refills: 0    ondansetron (ZOFRAN) 4 MG tablet Take 1 tablet (4 mg total) by mouth 3 (three) times daily as needed. Qty: 20 tablet, Refills: 1    pseudoephedrine (SUDAFED) 60 MG tablet Take 1 tablet (60 mg total) by mouth every 8 (eight) hours as needed. Qty: 30 tablet, Refills: 0   Associated Diagnoses: Acute maxillary  sinusitis, recurrence not specified      STOP taking these medications     doxycycline (VIBRA-TABS) 100 MG tablet         If you experience worsening of your admission symptoms, develop shortness of breath, life threatening emergency, suicidal or homicidal thoughts you must seek medical attention immediately by calling 911 or calling your MD immediately  if symptoms less severe.  You Must read complete instructions/literature along with all the possible adverse reactions/side effects for all the Medicines you take and that have been prescribed to you. Take any new Medicines after you have completely understood and accept all the possible adverse reactions/side effects.   Please note  You were cared for by a hospitalist during your hospital stay. If you have any questions about your discharge medications or the care you received while you were in the hospital after you are discharged, you can call the unit and asked to speak with the hospitalist on call if the hospitalist that took care of you is not available. Once you are discharged, your primary care physician will handle any further medical issues. Please note that NO REFILLS for any discharge medications will be authorized once you are discharged, as it is imperative that you return to your primary care physician (or establish a relationship with a primary care physician if you do not have one) for your aftercare needs so that they can reassess your need for medications and monitor your lab values. Today   SUBJECTIVE   Feels a lot better  VITAL SIGNS:  Blood pressure (!) 95/59, pulse 63, temperature 98.2 F (36.8 C), temperature source Oral, resp. rate 17, height 5\' 1"  (1.549 m), weight 40.4 kg (89 lb), SpO2 93 %.  I/O:   Intake/Output Summary (Last 24 hours) at 01/15/16 1123 Last data filed at 01/15/16 1051  Gross per 24 hour  Intake             1280 ml  Output                0 ml  Net             1280 ml    PHYSICAL  EXAMINATION:  GENERAL:  50 y.o.-year-old patient lying in the bed with no acute distress. Thin, cachectic EYES: Pupils equal, round, reactive to light and accommodation. No scleral icterus. Extraocular muscles intact.  HEENT: Head atraumatic, normocephalic. Oropharynx and nasopharynx clear.  NECK:  Supple, no jugular venous distention. No thyroid enlargement, no tenderness.  LUNGS: distant breath sounds bilaterally, no wheezing, rales,rhonchi or crepitation. No use of accessory muscles of respiration.  CARDIOVASCULAR: S1, S2 normal. No murmurs, rubs, or gallops.  ABDOMEN: Soft, non-tender, non-distended. Bowel sounds present. No organomegaly or mass.  EXTREMITIES: No pedal edema, cyanosis, or clubbing.  NEUROLOGIC: Cranial nerves II through XII are intact. Muscle strength 5/5 in all extremities. Sensation intact. Gait not checked.  PSYCHIATRIC: patient is alert and oriented x 3.  SKIN: No obvious rash, lesion, or ulcer.   DATA REVIEW:  CBC   Recent Labs Lab 01/15/16 0425  WBC 7.7  HGB 12.8  HCT 36.9  PLT 449*    Chemistries   Recent Labs Lab 01/12/16 0446  01/15/16 0425  NA 138  < > 138  K 3.9  < > 3.4*  CL 110  < > 105  CO2 24  < > 25  GLUCOSE 130*  < > 157*  BUN 7  < > 9  CREATININE 0.45  < > 0.46  CALCIUM 8.4*  < > 8.5*  MG 1.8  --   --   AST 23  --   --   ALT 11*  --   --   ALKPHOS 94  --   --   BILITOT 0.6  --   --   < > = values in this interval not displayed.  Microbiology Results   Recent Results (from the past 240 hour(s))  Blood culture (routine x 2)     Status: None (Preliminary result)   Collection Time: 01/11/16  8:55 PM  Result Value Ref Range Status   Specimen Description BLOOD LEFT WRIST  Final   Special Requests   Final    BOTTLES DRAWN AEROBIC AND ANAEROBIC 17CCAERO,15CCANA,normal   Culture NO GROWTH 4 DAYS  Final   Report Status PENDING  Incomplete  Blood culture (routine x 2)     Status: None (Preliminary result)   Collection Time:  01/11/16  8:55 PM  Result Value Ref Range Status   Specimen Description BLOOD RIGHT FOREARM  Final   Special Requests   Final    BOTTLES DRAWN AEROBIC AND ANAEROBIC 20CCAERO,25CCANA,Normal   Culture NO GROWTH 4 DAYS  Final   Report Status PENDING  Incomplete  MRSA PCR Screening     Status: None   Collection Time: 01/12/16 12:34 AM  Result Value Ref Range Status   MRSA by PCR NEGATIVE NEGATIVE Final    Comment:        The GeneXpert MRSA Assay (FDA approved for NASAL specimens only), is one component of a comprehensive MRSA colonization surveillance program. It is not intended to diagnose MRSA infection nor to guide or monitor treatment for MRSA infections.     RADIOLOGY:  Dg Chest 1 View  Result Date: 01/15/2016 CLINICAL DATA:  Dyspnea EXAM: CHEST 1 VIEW COMPARISON:  One-view chest x-ray 01/14/2016 FINDINGS: Bilateral lower lobe interstitial and airspace disease is again seen. The heart size is normal. Previous nodular density is less distinct on today's study. The upper lung fields remain clear. The visualized soft tissues and bony thorax are unremarkable. IMPRESSION: 1. Similar appearance of diffuse interstitial and airspace disease the lung bases bilaterally compatible with pneumonia. 2. Decreased conspicuity of a nodular density in the left lung. Recommend continued attention to this area on follow-up radiographs. Electronically Signed   By: Marin Roberts M.D.   On: 01/15/2016 07:23  Dg Chest Port 1 View  Result Date: 01/14/2016 CLINICAL DATA:  Patient with history of respiratory failure. ) . EXAM: PORTABLE CHEST 1 VIEW COMPARISON:  Chest radiograph 01/13/2016 FINDINGS: Monitoring leads overlie the patient. Stable cardiac and mediastinal contours. Unchanged bilateral mid and lower lung heterogeneous pulmonary opacities. Additionally within the left mid lung there is a 1.9 cm nodule. Biapical pleural parenchymal thickening. No pleural effusion or pneumothorax. IMPRESSION:  Grossly unchanged bilateral mid and lower lung airspace opacities concerning for pneumonia in the appropriate clinical setting. Additionally there is a 1.9 cm nodule within the left mid lung. This needs further  evaluation with chest CT. These results will be called to the ordering clinician or representative by the Radiologist Assistant, and communication documented in the PACS or zVision Dashboard. Electronically Signed   By: Annia Belt M.D.   On: 01/14/2016 07:12    Management plans discussed with the patient, family and they are in agreement.  CODE STATUS:     Code Status Orders        Start     Ordered   01/12/16 0018  Full code  Continuous     01/12/16 0018    Code Status History    Date Active Date Inactive Code Status Order ID Comments User Context   This patient has a current code status but no historical code status.      TOTAL TIME TAKING CARE OF THIS PATIENT: 40  minutes.    Vi Whitesel M.D on 01/15/2016 at 11:23 AM  Between 7am to 6pm - Pager - [phone] After 6pm go to [email or web address] - password [email or web address] [facility]  [facility]  Office  [phone]  CC: Primary care physician; Fidel Levy, MD

## 2016-01-15 NOTE — Progress Notes (Signed)
PULMONARY / CRITICAL CARE MEDICINE   Name: Joy Ball MRN: [id number] DOB: 12/12/1965    ADMISSION DATE:  01/11/2016    ASSESSMENT / PLAN:  PULMONARY A: Acute on chronic hypoxic respiratory failure --Due to AECOPD complicating history of pulmonary fibrosis, now doing much better.  --OK to discharge from respiratory standpoint, continue abx for total of 7 days.  --Continue oral steroid taper. Case d/w hospitalist.   Nicotine Abuse -Discussed importance of smoking cessation.   Lung nodule.  --lung node noted on radiology report, though on my review of image today I can not appreciate this. Overall the bibasilar atelectasis/pneumonitis seen on previous film has improved. --Repeat CXR on follow up outpatient with Dr. Bard Herbert.     H/O pulmonary fibrosis, NOS  Procalcitonin 7/22 18.74>> 13.04>> 4.64 on 7/24  CULTURES: MRSA screen negative Blood cultures 01/11/2016: Negative thus far  ANTIBIOTICS: Levaquin 500mg  07/21>>   -----------------------------------------------   SUBJECTIVE: Pt feeling/looking much better today, currently off of oxygen.   Allergies  Allergen Reactions  . Amoxicillin Hives    No current facility-administered medications on file prior to encounter.    Current Outpatient Prescriptions on File Prior to Encounter  Medication Sig  . albuterol (VENTOLIN HFA) 108 (90 Base) MCG/ACT inhaler Inhale 2 puffs into the lungs every 6 (six) hours as needed for wheezing or shortness of breath.  Marland Kitchen omeprazole (PRILOSEC) 20 MG capsule Take 20 mg by mouth daily.  . ranitidine (ZANTAC) 150 MG capsule Take 1 capsule (150 mg total) by mouth 2 (two) times daily.  Marland Kitchen dextromethorphan-guaiFENesin (MUCINEX DM) 30-600 MG 12hr tablet Take 1 tablet by mouth 2 (two) times daily.  Marland Kitchen doxycycline (VIBRA-TABS) 100 MG tablet Take 1 tablet (100 mg total) by mouth 2 (two) times daily.  Marland Kitchen ibuprofen (ADVIL,MOTRIN) 800 MG tablet Take 800 mg by mouth as needed.  . loperamide  (IMODIUM) 2 MG capsule TAKE 1 CAPSULE BY MOUTH AS NEEDED FOR DIARRHEA OR LOOSE STOOLS  . ondansetron (ZOFRAN) 4 MG tablet Take 1 tablet (4 mg total) by mouth 3 (three) times daily as needed.  . predniSONE (DELTASONE) 20 MG tablet Take 2 tablets (40 mg total) by mouth daily with breakfast. For 5 days  . pseudoephedrine (SUDAFED) 60 MG tablet Take 1 tablet (60 mg total) by mouth every 8 (eight) hours as needed.   REVIEW OF SYSTEMS:   Constitutional: Negative for fever and chills.  HENT: Negative for congestion and rhinorrhea.  Eyes: Negative for redness and visual disturbance.  Respiratory: positive for cough. Cardiovascular: Negative for chest pain and palpitations.  Gastrointestinal: Negative  for nausea , vomiting and abdominal pain and  loose stools Genitourinary: Negative for dysuria and urgency.  Endocrine: Denies polyuria, polyphagia and heat intolerance Musculoskeletal: Negative for myalgias and arthralgias.  Skin: Negative for pallor and wound.  Neurological: Negative for dizziness and headaches    VITAL SIGNS: BP (!) 95/59 (BP Location: Left Arm)   Pulse 63   Temp 98.2 F (36.8 C) (Oral)   Resp 17   Ht 5\' 1"  (1.549 m)   Wt 89 lb (40.4 kg)   SpO2 93%   BMI 16.82 kg/m   HEMODYNAMICS:    VENTILATOR SETTINGS:    INTAKE / OUTPUT: I/O last 3 completed shifts: In: 1190 [P.O.:1190] Out: 0   PHYSICAL EXAMINATION: General: Cachectic, mild respiratory distress Neuro: Alert and oriented 4, no focal deficits HEENT: PERRLA, oral mucosa moist, gag reflex intact Cardiovascular: rate and rhythm regular, S1, S2 audible, no murmur, regurg or  gallop Lungs: Normal work of breathing, bilateral airflow, breath sounds diminished in the bases.  Abdomen:  Non distended, normal bowel sounds Musculoskeletal:  Positive range of motion in upper and lower extremities Extremities:+2 pulses bilaterally, no cyanosis Skin:  Warm and dry  LABS:  BMET  Recent Labs Lab 01/12/16 0446  01/13/16 0526 01/15/16 0425  NA 138 140 138  K 3.9 4.2 3.4*  CL 110 108 105  CO2 BUN CREATININE 0.45 0.41* 0.46  GLUCOSE 130* 118* 157*    Electrolytes  Recent Labs Lab 01/12/16 0446 01/13/16 0526 01/15/16 0425  CALCIUM 8.4* 8.7* 8.5*  MG 1.8  --   --   PHOS 3.1  --   --     CBC  Recent Labs Lab 01/13/16 0526 01/14/16 0420 01/15/16 0425  WBC 11.0 8.5 7.7  HGB 11.9* 12.2 12.8  HCT 34.4* 35.4 36.9  PLT 396 405 449*    Coag's No results for input(s): APTT, INR in the last 168 hours.  Sepsis Markers  Recent Labs Lab 01/12/16 0446 01/13/16 0526 01/14/16 0420  PROCALCITON 18.74 13.04 4.64    ABG No results for input(s): PHART, PCO2ART, PO2ART in the last 168 hours.  Liver Enzymes  Recent Labs Lab 01/12/16 0446  AST 23  ALT 11*  ALKPHOS 94  BILITOT 0.6  ALBUMIN 2.9*    Cardiac Enzymes  Recent Labs Lab 01/11/16 1939 01/11/16 2155 01/12/16 0446  TROPONINI 0.07* <0.03 <0.03    Glucose  Recent Labs Lab 01/15/16 0727  GLUCAP 105*    Imaging Dg Chest 1 View  Result Date: 01/15/2016 CLINICAL DATA:  Dyspnea EXAM: CHEST 1 VIEW COMPARISON:  One-view chest x-ray 01/14/2016 FINDINGS: Bilateral lower lobe interstitial and airspace disease is again seen. The heart size is normal. Previous nodular density is less distinct on today's study. The upper lung fields remain clear. The visualized soft tissues and bony thorax are unremarkable. IMPRESSION: 1. Similar appearance of diffuse interstitial and airspace disease the lung bases bilaterally compatible with pneumonia. 2. Decreased conspicuity of a nodular density in the left lung. Recommend continued attention to this area on follow-up radiographs. Electronically Signed   By: Marin Roberts M.D.   On: 01/15/2016 07:23   STUDIES:  None    -Deep Berton Butrick M.D. 01/15/2016

## 2016-01-15 NOTE — Telephone Encounter (Signed)
Spoke with pt and scheduled hosp f/u and informed pt to get CXR prior to appt. Order placed for CXR. Nothing further needed.

## 2016-01-15 NOTE — Progress Notes (Signed)
01/15/2016 1:41 PM  BP (!) 95/59 (BP Location: Left Arm)   Pulse 63   Temp 98.2 F (36.8 C) (Oral)   Resp 17   Ht 5\' 1"  (1.549 m)   Wt 40.4 kg (89 lb)   SpO2 93%   BMI 16.82 kg/m  Patient discharged per MD orders. Discharge instructions reviewed with patient and patient verbalized understanding. IV removed per policy. Prescriptions discussed and given to patient. Discharged via wheelchair escorted by volunteer.  Ron Parker, RN

## 2016-01-15 NOTE — Telephone Encounter (Signed)
-----   Message from Shane Crutch, MD sent at 01/15/2016  9:46 AM EDT ----- Regarding: hfu Pt needs fu with [facility] in 2-4 weeks; needs 2 view CXR before visit re: lung nodule.

## 2016-01-15 NOTE — Care Management Note (Signed)
Case Management Note  Patient Details  Name: Joy Ball MRN: [id number] Date of Birth: 1965/09/04  Subjective/Objective:   49yo Ms Joy Ball was admitted 01/11/16 with PNA. Hx COPD and Pulmonary Fibrosis. PCP= Dr Jeryl Columbia. Pharmacy=[facility] in [place]. Lives alone. Has a home nebulizer machine. No home oxygen. No other home assistive equipment. No home health services. Drives self to appointments. Case management will follow for discharge planning.                  Action/Plan:   Expected Discharge Date:                  Expected Discharge Plan:     In-House Referral:     Discharge planning Services     Post Acute Care Choice:    Choice offered to:     DME Arranged:    DME Agency:     HH Arranged:    HH Agency:     Status of Service:     If discussed at [employer] of Stay Meetings, dates discussed:    Additional Comments:  Brenlyn Beshara A, RN 01/15/2016, 1:27 PM

## 2016-01-16 LAB — CULTURE, BLOOD (ROUTINE X 2)
CULTURE: NO GROWTH
Culture: NO GROWTH
SPECIAL REQUESTS: NORMAL
Special Requests: NORMAL

## 2016-01-27 IMAGING — CT CT CHEST-ABD-PELV W/ CM
2 of 5 series · 14 of 46 positions shown, 16 images · IV contrast (omnipaque)
Comparison: CT chest 01/29/2012

CLINICAL DATA: Pulmonary nodules. Weight loss, diarrhea, diffuse
abdominal pain

EXAM:
CT CHEST, ABDOMEN, AND PELVIS WITH CONTRAST
TECHNIQUE: Multidetector CT imaging of the chest, abdomen and pelvis was
performed following the standard protocol during bolus
administration of intravenous contrast.
CONTRAST:  75 mL Omnipaque 300

[Series 2: cap with · axial · 0.60mm/px · z∈[-1032,-507]mm · 11 of 119 slices shown, 13 images]
[im 7/119  soft-tissue]
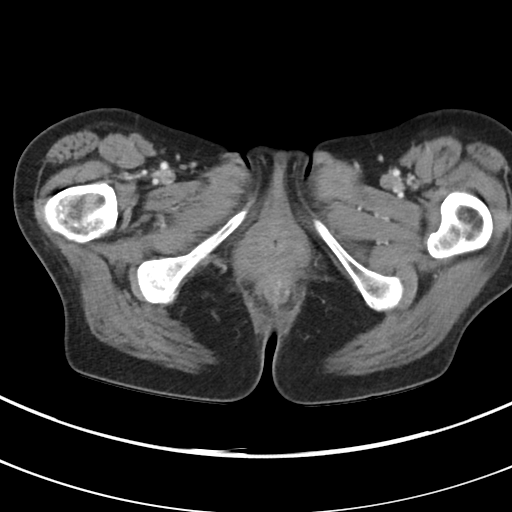
[im 7/119  bone]
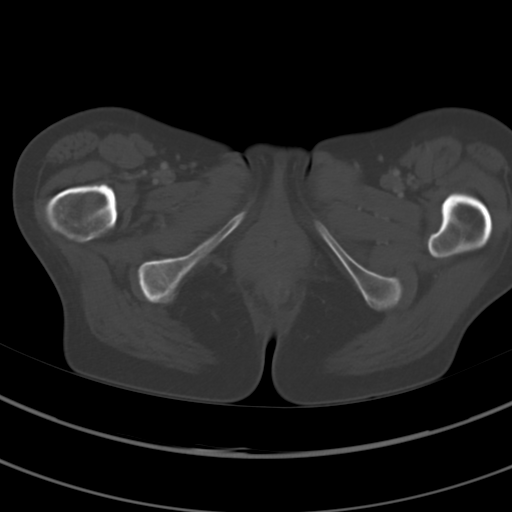
[im 20/119  soft-tissue]
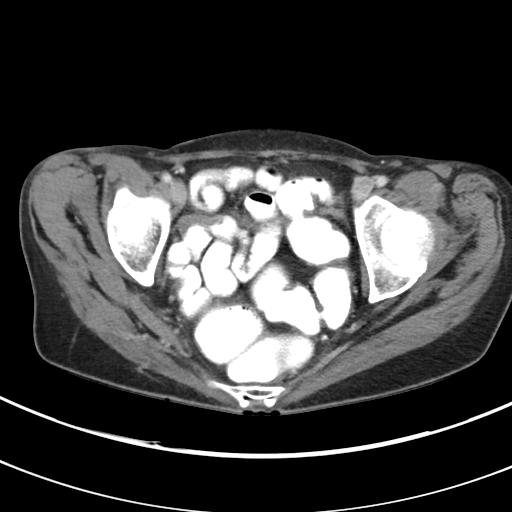
[im 27/119  soft-tissue]
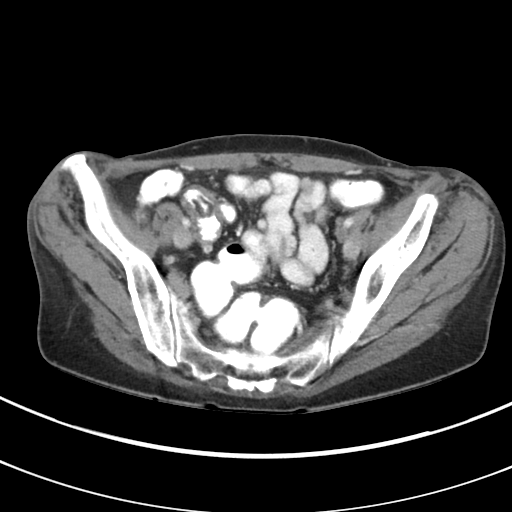
[im 40/119  soft-tissue]
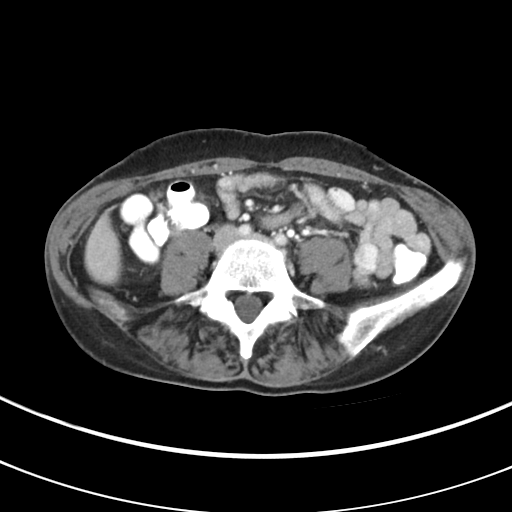
[im 46/119  soft-tissue]
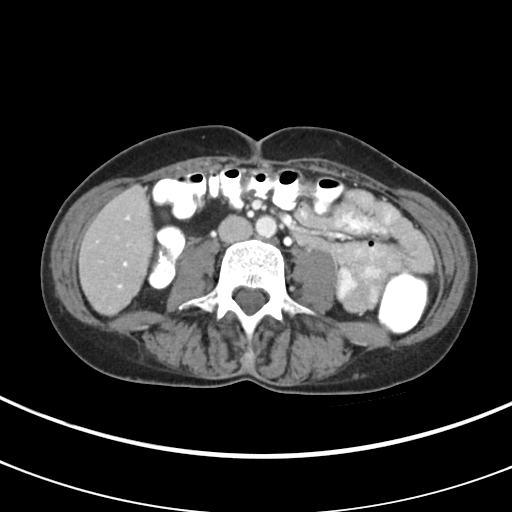
[im 60/119  soft-tissue]
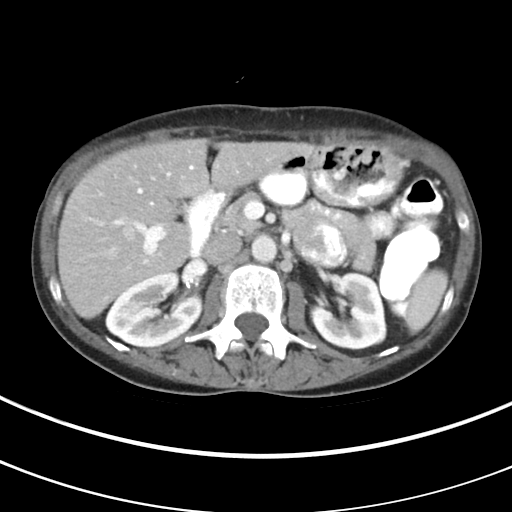
[im 73/119  soft-tissue]
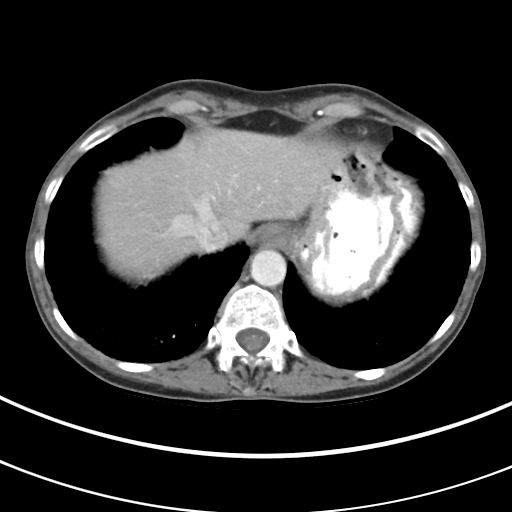
[im 79/119  soft-tissue]
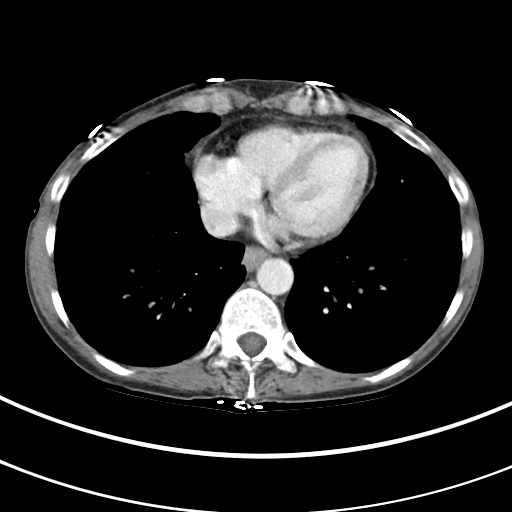
[im 92/119  soft-tissue]
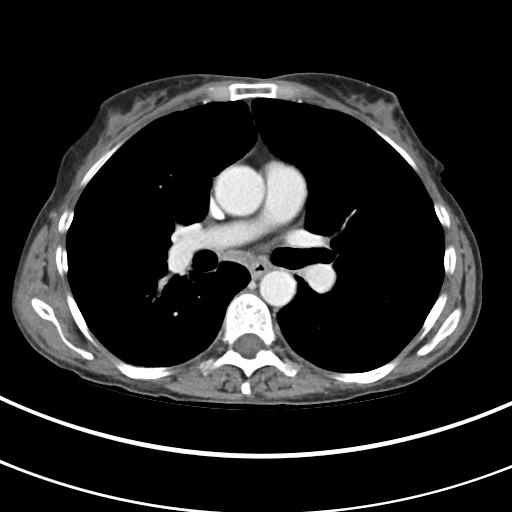
[im 92/119  bone]
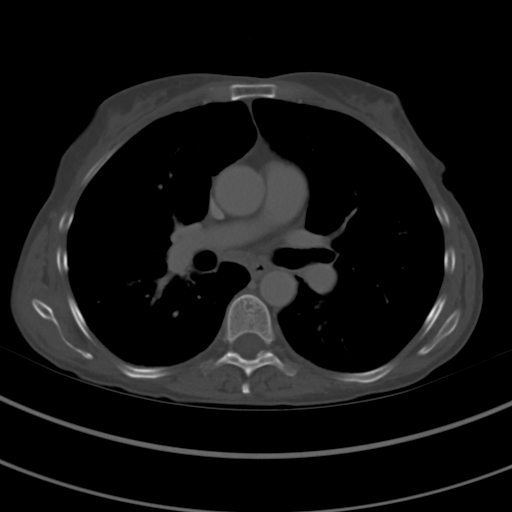
[im 99/119  soft-tissue]
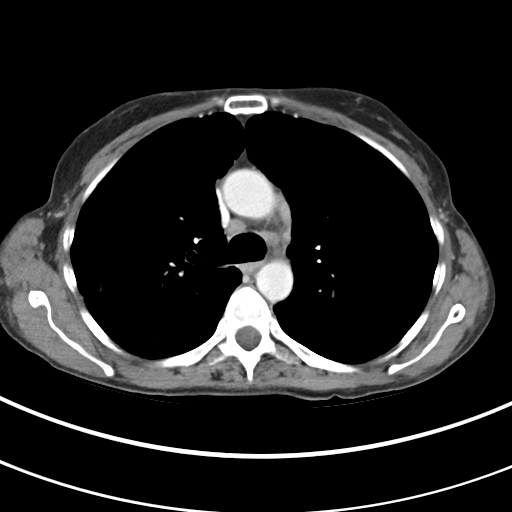
[im 112/119  soft-tissue]
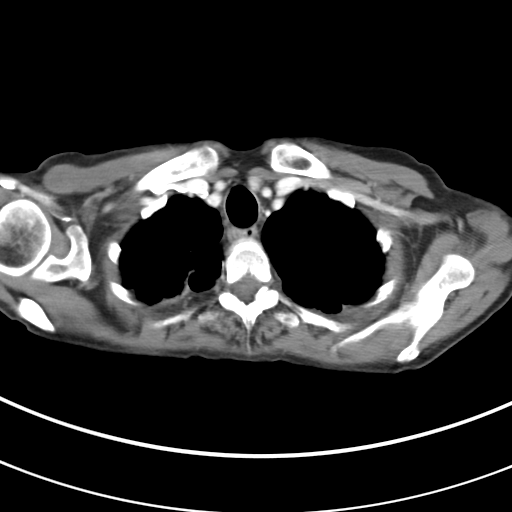

[Series 6: cor cap with · coronal · 0.63mm/px · 3 of 97 slices shown]
[im 33/97  soft-tissue]
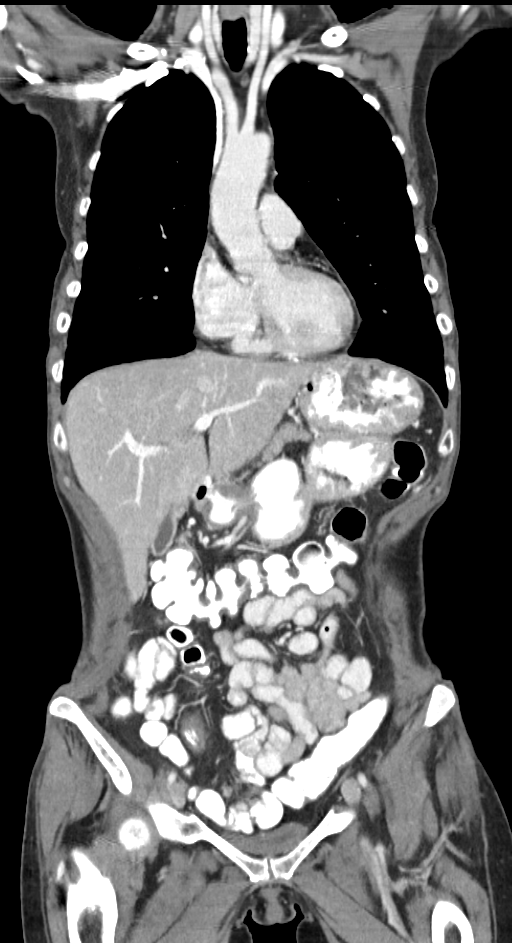
[im 43/97  soft-tissue]
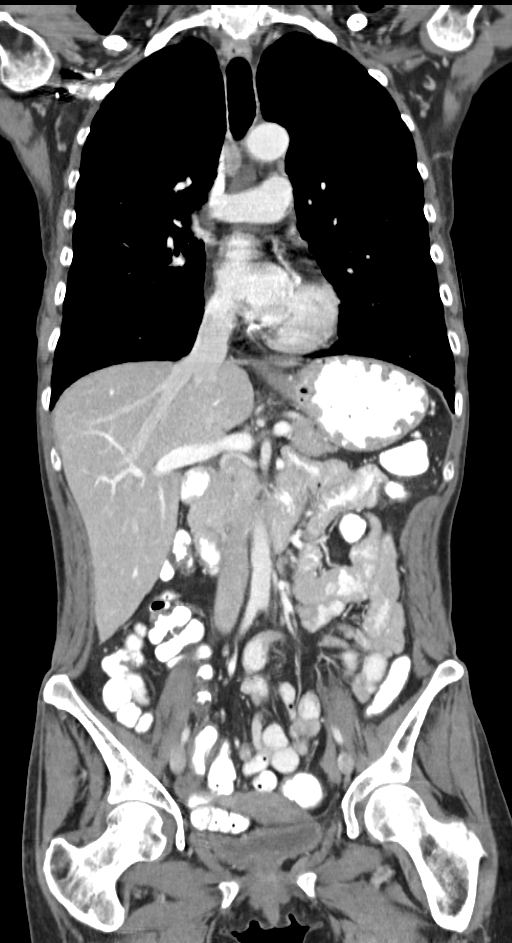
[im 54/97  soft-tissue]
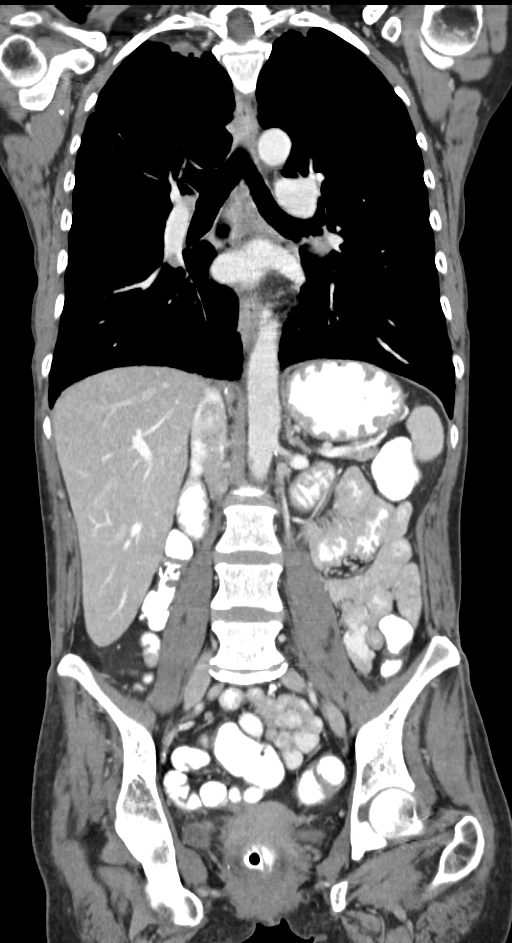

[14 of 46 positions shown; findings below may reference images not displayed]

FINDINGS: CT CHEST FINDINGS

The central airways are patent. There is bilateral upper lobe
fibrosis towards apices, right greater than left. This is likely
secondary to chronic inflammation versus prior infection. There are
small fibrotic areas in the posterior segment of the right upper
lobe. There is bilateral lower lung subpleural interstitial
thickening with mild cystic changes at the right lung base most
consistent with mild interstitial fibrosis. There is a 5 mm stable
pulmonary nodule along the major fissure. There is bilateral
centrilobular emphysema. There is no pleural effusion or
pneumothorax.

There is a mildly enlarged subcarinal lymph node measuring 10 mm in
short axis unchanged from the prior exam. There is no hilar or
axillary lymphadenopathy.

The heart size is normal. There is no pericardial effusion. The
thoracic aorta is normal in caliber.

Review of bone windows demonstrates no focal lytic or sclerotic
lesions.

CT ABDOMEN AND PELVIS FINDINGS

The liver demonstrates no focal abnormality. There is no
intrahepatic or extrahepatic biliary ductal dilatation. The
gallbladder is normal. The spleen demonstrates no focal abnormality.
The kidneys, adrenal glands and pancreas are normal. The bladder is
unremarkable.

The stomach, duodenum, small intestine, and large intestine
demonstrate no contrast extravasation or dilatation. There is no
pneumoperitoneum, pneumatosis, or portal venous gas. There is no
abdominal or pelvic free fluid. There is no lymphadenopathy.

The abdominal aorta is normal in caliber.

There are no lytic or sclerotic osseous lesions. Degenerative disc
disease with disc height loss at L5-S1 with a mild broad-based disc
bulge and mild bilateral facet arthropathy.
IMPRESSION: 1. Bilateral upper lobe fibrosis most severe towards the right apex
which is unchanged compared with 02/10/2012. Bilateral lower lobe
mild interstitial fibrosis, right greater than left. Stable
pulmonary nodule along the right major fissure.
2. Bilateral centrilobular emphysema.
3. No focal abdominal or pelvic abnormality.

## 2016-02-04 ENCOUNTER — Encounter: Payer: Self-pay | Admitting: Pulmonary Disease

## 2016-02-04 ENCOUNTER — Ambulatory Visit
Admission: RE | Admit: 2016-02-04 | Discharge: 2016-02-04 | Disposition: A | Discharge status: Home / Self Care | Payer: BLUE CROSS/BLUE SHIELD | Source: Ambulatory Visit | Attending: Pulmonary Disease | Admitting: Pulmonary Disease

## 2016-02-04 ENCOUNTER — Ambulatory Visit (INDEPENDENT_AMBULATORY_CARE_PROVIDER_SITE_OTHER): Payer: BLUE CROSS/BLUE SHIELD | Admitting: Pulmonary Disease

## 2016-02-04 VITALS — BP 98/62 | HR 87 | Ht 61.0 in | Wt 87.0 lb

## 2016-02-04 DIAGNOSIS — J189 Pneumonia, unspecified organism: Secondary | ICD-10-CM

## 2016-02-04 DIAGNOSIS — J432 Centrilobular emphysema: Secondary | ICD-10-CM | POA: Diagnosis not present

## 2016-02-04 DIAGNOSIS — J984 Other disorders of lung: Secondary | ICD-10-CM | POA: Insufficient documentation

## 2016-02-04 DIAGNOSIS — Z720 Tobacco use: Secondary | ICD-10-CM

## 2016-02-04 DIAGNOSIS — F17200 Nicotine dependence, unspecified, uncomplicated: Secondary | ICD-10-CM

## 2016-02-04 DIAGNOSIS — J8410 Pulmonary fibrosis, unspecified: Secondary | ICD-10-CM | POA: Diagnosis not present

## 2016-02-04 MED ORDER — VARENICLINE TARTRATE 0.5 MG X 11 & 1 MG X 42 PO MISC: ORAL | 0 refills | Status: DC

## 2016-02-04 MED ORDER — VARENICLINE TARTRATE 1 MG PO TABS: 1.0000 mg | ORAL_TABLET | Freq: Two times a day (BID) | ORAL | 5 refills | Status: DC

## 2016-02-04 NOTE — Patient Instructions (Signed)
Smoking cessation discussed Chantix as prescribed Follow up in 4-6 weeks with lung function tests

## 2016-02-07 ENCOUNTER — Encounter: Payer: Self-pay | Admitting: Family Medicine

## 2016-02-07 ENCOUNTER — Ambulatory Visit (INDEPENDENT_AMBULATORY_CARE_PROVIDER_SITE_OTHER): Payer: BLUE CROSS/BLUE SHIELD | Admitting: Family Medicine

## 2016-02-07 VITALS — BP 101/75 | HR 90 | Temp 98.4°F | Resp 16 | Ht 61.0 in | Wt 87.0 lb

## 2016-02-07 DIAGNOSIS — R05 Cough: Secondary | ICD-10-CM

## 2016-02-07 DIAGNOSIS — R058 Other specified cough: Secondary | ICD-10-CM

## 2016-02-07 DIAGNOSIS — J8410 Pulmonary fibrosis, unspecified: Secondary | ICD-10-CM

## 2016-02-07 DIAGNOSIS — J189 Pneumonia, unspecified organism: Secondary | ICD-10-CM

## 2016-02-07 DIAGNOSIS — Z720 Tobacco use: Secondary | ICD-10-CM | POA: Diagnosis not present

## 2016-02-07 MED ORDER — PSEUDOEPHEDRINE HCL 30 MG PO TABS: 30.0000 mg | ORAL_TABLET | Freq: Three times a day (TID) | ORAL | 0 refills | Status: DC | PRN

## 2016-02-07 MED ORDER — NICOTINE 14 MG/24HR TD PT24: MEDICATED_PATCH | TRANSDERMAL | 0 refills | Status: DC

## 2016-02-07 MED ORDER — GUAIFENESIN-CODEINE 100-10 MG/5ML PO SOLN: 5.0000 mL | Freq: Every evening | ORAL | 0 refills | Status: DC | PRN

## 2016-02-07 MED ORDER — NICOTINE 21 MG/24HR TD PT24: 21.0000 mg | MEDICATED_PATCH | Freq: Every day | TRANSDERMAL | 0 refills | Status: DC

## 2016-02-07 MED ORDER — BENZONATATE 100 MG PO CAPS: 100.0000 mg | ORAL_CAPSULE | Freq: Three times a day (TID) | ORAL | 0 refills | Status: DC | PRN

## 2016-02-07 MED ORDER — FLUTICASONE PROPIONATE 50 MCG/ACT NA SUSP: 2.0000 | Freq: Every day | NASAL | 11 refills | Status: DC

## 2016-02-07 NOTE — Progress Notes (Signed)
Subjective:    Patient ID: Joy Ball, female    DOB: 31-May-1966, 50 y.o.   MRN: [id number]  HPI: Joy Ball is a 50 y.o. female presenting on 02/07/2016 for Hospitalization Follow-up (pt had pnemonia and went to ER and she still has congestion and cough)   HPI  Pt presents for follow-up of pneumonia. Still having cough and congestion. Was hospitalized for pneumonia at the end of July. Saw Pulmonology on Monday. CXR was clear just showing fibrosis. Pt reports she still coughing up yellow phlegm. Usually coughs up clear/white sputum. Cough is worse in the evening and early AM. Keeping awake.  Is having some nasal drainage. Nose fills up in the shower.  Still smoking. Is thinking about quitting. Pulm prescribed Chantix but she is unable to afford 2/2 insurance issues. Is interested in trying nicotine patches today.   Past Medical History:  Diagnosis Date  . Asthma   . COPD (chronic obstructive pulmonary disease) (HCC)   . Emphysema lung (HCC)   . GERD (gastroesophageal reflux disease)   . Migraines   . Seasonal allergies     Current Outpatient Prescriptions on File Prior to Visit  Medication Sig  . acetaminophen (TYLENOL) 500 MG tablet Take 1,500 mg by mouth 3 (three) times daily. 2 to 3 times a day.  . albuterol (VENTOLIN HFA) 108 (90 Base) MCG/ACT inhaler Inhale 2 puffs into the lungs every 6 (six) hours as needed for wheezing or shortness of breath.  . budesonide (PULMICORT) 0.25 MG/2ML nebulizer solution Take 2 mLs (0.25 mg total) by nebulization every 12 (twelve) hours.  Marland Kitchen ibuprofen (ADVIL,MOTRIN) 800 MG tablet Take 800 mg by mouth as needed.  . loperamide (IMODIUM) 2 MG capsule TAKE 1 CAPSULE BY MOUTH AS NEEDED FOR DIARRHEA OR LOOSE STOOLS  . loratadine (CLARITIN) 10 MG tablet Take 10 mg by mouth daily.  Marland Kitchen omeprazole (PRILOSEC) 20 MG capsule Take 20 mg by mouth daily.  . ondansetron (ZOFRAN) 4 MG tablet Take 1 tablet (4 mg total) by mouth 3 (three) times daily as needed.    . ranitidine (ZANTAC) 150 MG capsule Take 1 capsule (150 mg total) by mouth 2 (two) times daily.   No current facility-administered medications on file prior to visit.     Review of Systems  Constitutional: Negative for chills and fever.  HENT: Positive for congestion and postnasal drip. Negative for sinus pressure, sneezing and trouble swallowing.   Respiratory: Positive for cough. Negative for chest tightness, shortness of breath and wheezing.   Cardiovascular: Negative for chest pain and leg swelling.  Gastrointestinal: Negative for abdominal pain, constipation, diarrhea, nausea and vomiting.  Endocrine: Negative.  Negative for cold intolerance, heat intolerance, polydipsia, polyphagia and polyuria.  Genitourinary: Negative for difficulty urinating and dysuria.  Musculoskeletal: Negative.   Neurological: Negative for dizziness, light-headedness and numbness.  Psychiatric/Behavioral: Negative.    Per HPI unless specifically indicated above     Objective:    BP 101/75 (BP Location: Left Arm, Patient Position: Sitting, Cuff Size: Small)   Pulse 90   Temp 98.4 F (36.9 C) (Oral)   Resp 16   Ht 5\' 1"  (1.549 m)   Wt 87 lb (39.5 kg)   SpO2 98%   BMI 16.44 kg/m   Wt Readings from Last 3 Encounters:  02/07/16 87 lb (39.5 kg)  02/04/16 87 lb (39.5 kg)  01/11/16 89 lb (40.4 kg)    Physical Exam  Constitutional: She is oriented to person, place, and time. She  appears well-developed and well-nourished.  HENT:  Head: Normocephalic and atraumatic.  Right Ear: Hearing and tympanic membrane normal.  Left Ear: Hearing and tympanic membrane normal.  Nose: No mucosal edema or rhinorrhea. Right sinus exhibits no maxillary sinus tenderness and no frontal sinus tenderness. Left sinus exhibits no maxillary sinus tenderness and no frontal sinus tenderness.  Mouth/Throat: Uvula is midline, oropharynx is clear and moist and mucous membranes are normal. No posterior oropharyngeal erythema.   Nasal drainage seen in the oropharynx.   Neck: Neck supple.  Cardiovascular: Normal rate, regular rhythm and normal heart sounds.  Exam reveals no gallop and no friction rub.   No murmur heard. Pulmonary/Chest: Effort normal and breath sounds normal. She has no wheezes. She exhibits no tenderness.  Abdominal: Soft. Normal appearance and bowel sounds are normal. She exhibits no distension and no mass. There is no tenderness. There is no rebound and no guarding.  Musculoskeletal: Normal range of motion. She exhibits no edema or tenderness.  Lymphadenopathy:    She has no cervical adenopathy.  Neurological: She is alert and oriented to person, place, and time.  Skin: Skin is warm and dry.   Results for orders placed or performed during the hospital encounter of 01/11/16  Blood culture (routine x 2)  Result Value Ref Range   Specimen Description BLOOD LEFT WRIST    Special Requests      BOTTLES DRAWN AEROBIC AND ANAEROBIC 17CCAERO,15CCANA,normal   Culture NO GROWTH 5 DAYS    Report Status 01/16/2016 FINAL   Blood culture (routine x 2)  Result Value Ref Range   Specimen Description BLOOD RIGHT FOREARM    Special Requests      BOTTLES DRAWN AEROBIC AND ANAEROBIC 20CCAERO,25CCANA,Normal   Culture NO GROWTH 5 DAYS    Report Status 01/16/2016 FINAL   MRSA PCR Screening  Result Value Ref Range   MRSA by PCR NEGATIVE NEGATIVE  CBC with Differential  Result Value Ref Range   WBC 20.4 (H) 3.6 - 11.0 K/uL   RBC 3.75 (L) 3.80 - 5.20 MIL/uL   Hemoglobin 13.5 12.0 - 16.0 g/dL   HCT 11.9 14.7 - 82.9 %   MCV 104.8 (H) 80.0 - 100.0 fL   MCH 36.0 (H) 26.0 - 34.0 pg   MCHC 34.3 32.0 - 36.0 g/dL   RDW 56.2 13.0 - 86.5 %   Platelets 442 (H) 150 - 440 K/uL   Neutrophils Relative % 81 %   Neutro Abs 16.4 (H) 1.4 - 6.5 K/uL   Lymphocytes Relative 14 %   Lymphs Abs 2.9 1.0 - 3.6 K/uL   Monocytes Relative 5 %   Monocytes Absolute 1.0 (H) 0.2 - 0.9 K/uL   Eosinophils Relative 0 %   Eosinophils  Absolute 0.0 0 - 0.7 K/uL   Basophils Relative 0 %   Basophils Absolute 0.1 0 - 0.1 K/uL  Basic metabolic panel  Result Value Ref Range   Sodium 139 135 - 145 mmol/L   Potassium 3.0 (L) 3.5 - 5.1 mmol/L   Chloride 108 101 - 111 mmol/L   CO2 19 (L) 22 - 32 mmol/L   Glucose, Bld 98 65 - 99 mg/dL   BUN 9 6 - 20 mg/dL   Creatinine, Ser 7.84 0.44 - 1.00 mg/dL   Calcium 8.9 8.9 - 69.6 mg/dL   GFR calc non Af Amer >60 >60 mL/min   GFR calc Af Amer >60 >60 mL/min   Anion gap 12 5 - 15  Troponin I  Result  Value Ref Range   Troponin I 0.07 (HH) <0.03 ng/mL  Blood gas, venous  Result Value Ref Range   pH, Ven 7.43 7.320 - 7.430   pCO2, Ven 34 (L) 44.0 - 60.0 mmHg   pO2, Ven 44.0 31.0 - 45.0 mmHg   Bicarbonate 22.6 21.0 - 28.0 mEq/L   Acid-base deficit 1.1 0.0 - 2.0 mmol/L   O2 Saturation 81.2 %   Patient temperature 37.0    Collection site VEIN    Sample type VEIN   Troponin I (q 6hr x 3)  Result Value Ref Range   Troponin I <0.03 <0.03 ng/mL  Troponin I (q 6hr x 3)  Result Value Ref Range   Troponin I <0.03 <0.03 ng/mL  Comprehensive metabolic panel  Result Value Ref Range   Sodium 138 135 - 145 mmol/L   Potassium 3.9 3.5 - 5.1 mmol/L   Chloride 110 101 - 111 mmol/L   CO2 24 22 - 32 mmol/L   Glucose, Bld 130 (H) 65 - 99 mg/dL   BUN 7 6 - 20 mg/dL   Creatinine, Ser 9.600.45 0.44 - 1.00 mg/dL   Calcium 8.4 (L) 8.9 - 10.3 mg/dL   Total Protein 5.6 (L) 6.5 - 8.1 g/dL   Albumin 2.9 (L) 3.5 - 5.0 g/dL   AST 23 15 - 41 U/L   ALT 11 (L) 14 - 54 U/L   Alkaline Phosphatase 94 38 - 126 U/L   Total Bilirubin 0.6 0.3 - 1.2 mg/dL   GFR calc non Af Amer >60 >60 mL/min   GFR calc Af Amer >60 >60 mL/min   Anion gap 4 (L) 5 - 15  CBC  Result Value Ref Range   WBC 11.7 (H) 3.6 - 11.0 K/uL   RBC 3.37 (L) 3.80 - 5.20 MIL/uL   Hemoglobin 12.0 12.0 - 16.0 g/dL   HCT 45.435.4 09.835.0 - 11.947.0 %   MCV 105.1 (H) 80.0 - 100.0 fL   MCH 35.6 (H) 26.0 - 34.0 pg   MCHC 33.9 32.0 - 36.0 g/dL   RDW 14.713.0 82.911.5  - 56.214.5 %   Platelets 392 150 - 440 K/uL  Magnesium  Result Value Ref Range   Magnesium 1.8 1.7 - 2.4 mg/dL  Phosphorus  Result Value Ref Range   Phosphorus 3.1 2.5 - 4.6 mg/dL  Procalcitonin - Baseline  Result Value Ref Range   Procalcitonin 18.74 ng/mL  Procalcitonin  Result Value Ref Range   Procalcitonin 13.04 ng/mL  CBC  Result Value Ref Range   WBC 11.0 3.6 - 11.0 K/uL   RBC 3.30 (L) 3.80 - 5.20 MIL/uL   Hemoglobin 11.9 (L) 12.0 - 16.0 g/dL   HCT 13.034.4 (L) 86.535.0 - 78.447.0 %   MCV 104.3 (H) 80.0 - 100.0 fL   MCH 36.2 (H) 26.0 - 34.0 pg   MCHC 34.7 32.0 - 36.0 g/dL   RDW 69.613.0 29.511.5 - 28.414.5 %   Platelets 396 150 - 440 K/uL  Basic metabolic panel  Result Value Ref Range   Sodium 140 135 - 145 mmol/L   Potassium 4.2 3.5 - 5.1 mmol/L   Chloride 108 101 - 111 mmol/L   CO2 27 22 - 32 mmol/L   Glucose, Bld 118 (H) 65 - 99 mg/dL   BUN 10 6 - 20 mg/dL   Creatinine, Ser 1.320.41 (L) 0.44 - 1.00 mg/dL   Calcium 8.7 (L) 8.9 - 10.3 mg/dL   GFR calc non Af Amer >60 >60 mL/min   GFR  calc Af Amer >60 >60 mL/min   Anion gap 5 5 - 15  Procalcitonin  Result Value Ref Range   Procalcitonin 4.64 ng/mL  CBC  Result Value Ref Range   WBC 8.5 3.6 - 11.0 K/uL   RBC 3.36 (L) 3.80 - 5.20 MIL/uL   Hemoglobin 12.2 12.0 - 16.0 g/dL   HCT 16.135.4 09.635.0 - 04.547.0 %   MCV 105.2 (H) 80.0 - 100.0 fL   MCH 36.4 (H) 26.0 - 34.0 pg   MCHC 34.6 32.0 - 36.0 g/dL   RDW 40.912.7 81.111.5 - 91.414.5 %   Platelets 405 150 - 440 K/uL  CBC  Result Value Ref Range   WBC 7.7 3.6 - 11.0 K/uL   RBC 3.52 (L) 3.80 - 5.20 MIL/uL   Hemoglobin 12.8 12.0 - 16.0 g/dL   HCT 78.236.9 95.635.0 - 21.347.0 %   MCV 105.0 (H) 80.0 - 100.0 fL   MCH 36.3 (H) 26.0 - 34.0 pg   MCHC 34.6 32.0 - 36.0 g/dL   RDW 08.612.9 57.811.5 - 46.914.5 %   Platelets 449 (H) 150 - 440 K/uL  Basic metabolic panel  Result Value Ref Range   Sodium 138 135 - 145 mmol/L   Potassium 3.4 (L) 3.5 - 5.1 mmol/L   Chloride 105 101 - 111 mmol/L   CO2 25 22 - 32 mmol/L   Glucose, Bld 157 (H)  65 - 99 mg/dL   BUN 9 6 - 20 mg/dL   Creatinine, Ser 6.290.46 0.44 - 1.00 mg/dL   Calcium 8.5 (L) 8.9 - 10.3 mg/dL   GFR calc non Af Amer >60 >60 mL/min   GFR calc Af Amer >60 >60 mL/min   Anion gap 8 5 - 15  Glucose, capillary  Result Value Ref Range   Glucose-Capillary 105 (H) 65 - 99 mg/dL  Glucose, capillary  Result Value Ref Range   Glucose-Capillary 164 (H) 65 - 99 mg/dL      Assessment & Plan:   Problem List Items Addressed This Visit      Respiratory   Pulmonary fibrosis (HCC)    Followed by pulmonology. Will follow-up in 4-6 weeks for PFTs.        Other   Tobacco abuse    Discussed options if chantix is not affordable. Encouraged pt to quit for her continued health. Will try nicotine patches due to cost.       Relevant Medications   nicotine (NICODERM CQ - DOSED IN MG/24 HOURS) 21 mg/24hr patch   nicotine (NICODERM CQ - DOSED IN MG/24 HOURS) 14 mg/24hr patch    Other Visit Diagnoses    CAP (community acquired pneumonia)    -  Primary   Appears resolved based on CXR 8/14. Still having cough. Likely linger or 2/2 pulmonary issues. Treat symptomatically. Alarm symptoms reviewed.    Relevant Medications   guaiFENesin-codeine 100-10 MG/5ML syrup   benzonatate (TESSALON) 100 MG capsule   fluticasone (FLONASE) 50 MCG/ACT nasal spray   pseudoephedrine (SUDAFED) 30 MG tablet   Upper airway cough syndrome       Nasal drainage likely contributing. Flonase and sudafed to help dry out secretions. Return PRN.    Relevant Medications   guaiFENesin-codeine 100-10 MG/5ML syrup   benzonatate (TESSALON) 100 MG capsule   fluticasone (FLONASE) 50 MCG/ACT nasal spray   pseudoephedrine (SUDAFED) 30 MG tablet      Meds ordered this encounter  Medications  . guaiFENesin-codeine 100-10 MG/5ML syrup    Sig: Take 5 mLs by  mouth at bedtime as needed for cough.    Dispense:  100 mL    Refill:  0    Order Specific Question:   Supervising Provider    Answer:   Janeann Forehand  [[id number]]  . benzonatate (TESSALON) 100 MG capsule    Sig: Take 1 capsule (100 mg total) by mouth 3 (three) times daily as needed.    Dispense:  30 capsule    Refill:  0    Order Specific Question:   Supervising Provider    Answer:   Janeann Forehand [[id number]]  . fluticasone (FLONASE) 50 MCG/ACT nasal spray    Sig: Place 2 sprays into both nostrils daily.    Dispense:  16 g    Refill:  11    Order Specific Question:   Supervising Provider    Answer:   Janeann Forehand [phone]  . pseudoephedrine (SUDAFED) 30 MG tablet    Sig: Take 1 tablet (30 mg total) by mouth every 8 (eight) hours as needed for congestion.    Dispense:  30 tablet    Refill:  0    Order Specific Question:   Supervising Provider    Answer:   Janeann Forehand [phone]  . nicotine (NICODERM CQ - DOSED IN MG/24 HOURS) 21 mg/24hr patch    Sig: Place 1 patch (21 mg total) onto the skin daily.    Dispense:  28 patch    Refill:  0    Order Specific Question:   Supervising Provider    Answer:   Janeann Forehand [phone]  . nicotine (NICODERM CQ - DOSED IN MG/24 HOURS) 14 mg/24hr patch    Sig: Apply once daily to the skin for 2 weeks after completing the 21 mg/hr patch.    Dispense:  14 patch    Refill:  0    Order Specific Question:   Supervising Provider    Answer:   Janeann Forehand [phone]      Follow up plan: Return if symptoms worsen or fail to improve.

## 2016-02-07 NOTE — Assessment & Plan Note (Signed)
Followed by pulmonology. Will follow-up in 4-6 weeks for PFTs.

## 2016-02-07 NOTE — Assessment & Plan Note (Signed)
Discussed options if chantix is not affordable. Encouraged pt to quit for her continued health. Will try nicotine patches due to cost.

## 2016-02-07 NOTE — Patient Instructions (Addendum)
Please seek immediate medical attention if you develop shortness of breath not relieve by inhaler, chest pain/tightness, fever > 103 F or other concerning symptoms.   We will try some cough and congestion medications to help keep your comfortable. Take the cough syrup at bedtime to help with cough as needed. Take tessalon perles up to 3 times daily.  Keep doing your inhalers. Rinse mouth after every Pulmicort. Please let me know if you continue to have thrush symptoms and we will send in nystatin.   Please consider smoking cessation. This is the best thing for your health. I have sent in patches since the Chantix may not be covered by your insurance. Apply 1 patch daily and change arms when you do the patches.

## 2016-02-09 ENCOUNTER — Encounter: Payer: Self-pay | Admitting: Pulmonary Disease

## 2016-02-09 NOTE — Progress Notes (Signed)
PULMONARY POST HOSPITAL FOLLOW UP  PT PROFILE: [age] F hospitalized 07/21-07/25/17 with acute on chronic respiratory failure due to COPD, CAP. [facility] and was noted to have been seen by Dr Kendrick FriesMcQuaid in past for COPD and nonspecific pulmonary fibrosis. She had bilateral basilar infiltrates and elevated PCT.   SUBJ: Since discharge she has continued to improve gradually back to baseline and indicates that she is 60-65% back to her baseline. She continues to have chest congestion with yellow mucus. She was discharged on albuterol nebs which she is using approx 2X daily. Unfortunately, she has resumed smoking and is again smoking 1 ppd. Denies CP, fever, hemoptysis, LE edema and calf tenderness.   OBJ: Vitals:   02/04/16 1056 02/04/16 1057  BP:  98/62  Pulse:  87  SpO2:  97%  Weight: 87 lb (39.5 kg)   Height: 5\' 1"  (1.549 m)    GEN: Very thin, NAD @ rest HEENT: WNL Neck: no LAN, no JVD Chest: hyperresonant to percussion, diminished BS, bibasilar crackles, no wheezes Heart: Reg, no M Abd: soft, + BS Ext: No C/C/E Neuro: grossly intact  BMP Latest Ref Rng & Units 01/15/2016 01/13/2016 01/12/2016  Glucose 65 - 99 mg/dL 161(W157(H) 960(A118(H) 540(J130(H)  BUN 6 - 20 mg/dL 9 10 7   Creatinine 0.44 - 1.00 mg/dL 8.110.46 9.14(N0.41(L) 8.290.45  Sodium 135 - 145 mmol/L 138 140 138  Potassium 3.5 - 5.1 mmol/L 3.4(L) 4.2 3.9  Chloride 101 - 111 mmol/L 105 108 110  CO2 22 - 32 mmol/L 25 27 24   Calcium 8.9 - 10.3 mg/dL 5.6(O8.5(L) 1.3(Y8.7(L) 8.6(V8.4(L)   CBC Latest Ref Rng & Units 01/15/2016 01/14/2016 01/13/2016  WBC 3.6 - 11.0 K/uL 7.7 8.5 11.0  Hemoglobin 12.0 - 16.0 g/dL 78.412.8 69.612.2 11.9(L)  Hematocrit 35.0 - 47.0 % 36.9 35.4 34.4(L)  Platelets 150 - 440 K/uL 449(H) 405 396   CXR (02/04/16): Hyperinflation, chronic interstitial prominence, chronic RUL scarring/nodule, resolution of B basilar infiltrates  IMPRESSION: 1) Severe COPD with chronic nonspecific fibrosis 2) Recent CAP, NOS 3) recalcitrant  smoker  PLAN: 1) I counseled emphatically that she must quit smoking. She has very advanced lung disease at a very young age 80) Chantix discussed @ length and she wishes to go this route. Starter and continuation paks ordered 3) Continue nebulized budesonide and albuterol. We will refine this regimen over time 4) ROV 6 weeks with PFTs  Billy Fischeravid Xyla Leisner, MD PCCM service Mobile [phone] Pager [phone] 02/09/2016

## 2016-03-06 ENCOUNTER — Other Ambulatory Visit: Payer: Self-pay | Admitting: Family Medicine

## 2016-03-06 DIAGNOSIS — J189 Pneumonia, unspecified organism: Secondary | ICD-10-CM

## 2016-03-06 DIAGNOSIS — R058 Other specified cough: Secondary | ICD-10-CM

## 2016-03-06 DIAGNOSIS — R05 Cough: Secondary | ICD-10-CM

## 2016-03-10 ENCOUNTER — Encounter: Payer: Self-pay | Admitting: *Deleted

## 2016-03-10 ENCOUNTER — Ambulatory Visit: Payer: BLUE CROSS/BLUE SHIELD | Admitting: Pulmonary Disease

## 2016-03-24 ENCOUNTER — Telehealth: Payer: Self-pay | Admitting: Family Medicine

## 2016-03-24 NOTE — Telephone Encounter (Signed)
done

## 2016-03-24 NOTE — Telephone Encounter (Addendum)
Pt applied for a job and had to do a drug test.  The cough medicine with codeine showed up.  She needs something faxed to [facility] [phone] showing she was prescribed the cough medicine.  Her call back number is [phone]

## 2016-04-02 ENCOUNTER — Other Ambulatory Visit: Payer: Self-pay | Admitting: Family Medicine

## 2016-04-02 DIAGNOSIS — R058 Other specified cough: Secondary | ICD-10-CM

## 2016-04-02 DIAGNOSIS — J189 Pneumonia, unspecified organism: Secondary | ICD-10-CM

## 2016-04-02 DIAGNOSIS — R05 Cough: Secondary | ICD-10-CM

## 2016-04-07 ENCOUNTER — Other Ambulatory Visit: Payer: Self-pay | Admitting: Family Medicine

## 2016-04-07 DIAGNOSIS — R05 Cough: Secondary | ICD-10-CM

## 2016-04-07 DIAGNOSIS — J189 Pneumonia, unspecified organism: Secondary | ICD-10-CM

## 2016-04-07 DIAGNOSIS — R058 Other specified cough: Secondary | ICD-10-CM

## 2016-05-13 ENCOUNTER — Other Ambulatory Visit: Payer: Self-pay | Admitting: Family Medicine

## 2016-05-13 DIAGNOSIS — J189 Pneumonia, unspecified organism: Secondary | ICD-10-CM

## 2016-05-13 DIAGNOSIS — R058 Other specified cough: Secondary | ICD-10-CM

## 2016-05-13 DIAGNOSIS — R05 Cough: Secondary | ICD-10-CM

## 2016-05-13 MED ORDER — BENZONATATE 100 MG PO CAPS: ORAL_CAPSULE | ORAL | 0 refills | Status: DC

## 2016-07-31 ENCOUNTER — Ambulatory Visit
Admission: RE | Admit: 2016-07-31 | Discharge: 2016-07-31 | Disposition: A | Discharge status: Home / Self Care | Payer: Self-pay | Source: Ambulatory Visit | Attending: Family Medicine | Admitting: Family Medicine

## 2016-07-31 ENCOUNTER — Encounter: Payer: Self-pay | Admitting: Family Medicine

## 2016-07-31 ENCOUNTER — Ambulatory Visit (INDEPENDENT_AMBULATORY_CARE_PROVIDER_SITE_OTHER): Payer: Self-pay | Admitting: Family Medicine

## 2016-07-31 VITALS — BP 111/76 | HR 103 | Temp 99.1°F | Resp 16 | Ht 61.0 in | Wt 89.5 lb

## 2016-07-31 DIAGNOSIS — J209 Acute bronchitis, unspecified: Secondary | ICD-10-CM

## 2016-07-31 DIAGNOSIS — J440 Chronic obstructive pulmonary disease with acute lower respiratory infection: Secondary | ICD-10-CM

## 2016-07-31 DIAGNOSIS — R509 Fever, unspecified: Secondary | ICD-10-CM | POA: Insufficient documentation

## 2016-07-31 DIAGNOSIS — R05 Cough: Secondary | ICD-10-CM

## 2016-07-31 DIAGNOSIS — R058 Other specified cough: Secondary | ICD-10-CM

## 2016-07-31 DIAGNOSIS — J111 Influenza due to unidentified influenza virus with other respiratory manifestations: Secondary | ICD-10-CM | POA: Insufficient documentation

## 2016-07-31 LAB — POCT INFLUENZA A/B
INFLUENZA B, POC: NEGATIVE
Influenza A, POC: NEGATIVE

## 2016-07-31 MED ORDER — FLUTICASONE PROPIONATE 50 MCG/ACT NA SUSP: 2.0000 | Freq: Every day | NASAL | 11 refills | Status: DC

## 2016-07-31 MED ORDER — OSELTAMIVIR PHOSPHATE 75 MG PO CAPS: 75.0000 mg | ORAL_CAPSULE | Freq: Two times a day (BID) | ORAL | 0 refills | Status: DC

## 2016-07-31 MED ORDER — BENZONATATE 100 MG PO CAPS: ORAL_CAPSULE | ORAL | 0 refills | Status: DC

## 2016-07-31 NOTE — Patient Instructions (Signed)
Thank you for coming in to clinic today.  Influenza test was NEGATIVE, however I am concerned this may be a false negative test given your close contact with Flu, it may be early - We will go ahead and treat you for influenza - Start Tamiflu 75mg  capsule take one twice a day for 5 days  1. It sounds like you had an Upper Respiratory Virus that has settled into a Bronchitis, lower respiratory tract infection. Once you are feeling better, the cough may take a few weeks to fully resolve. I don't hear wheezing so less likely COPD, but this is possible. Also I am concerned with your fever that we will check for Pneumonia on Chest-Xray - based on results will call you today with information if need to start antibiotic. - Use home nebulizer or albuterol every 4-6 hours around the clock for next 2-3 days up to 5 days then as needed - Start Tessalon 1 pill 3 times a day as needed for cough - Recommend OTC Mucinex-DM for cough as well for 1 week to help clear secretions - Resume Loratadine (Claritin) 10mg  daily and Flonase 2 sprays in each nostril daily for next 4-6 weeks, then you may stop and use seasonally or as needed - Use nasal saline (Simply Saline or Ocean Spray) to flush nasal congestion multiple times a day, may help cough - Drink plenty of fluids to improve congestion   If your symptoms seem to worsen instead of improve over next several days, including significant fever / chills, worsening shortness of breath, worsening wheezing, or nausea / vomiting and can't take medicines - return sooner or go to hospital Emergency Department for more immediate treatment.  If not improved then we can consider starting antibiotics within 24 hours, call office tomorrow or send mychart message if you are worsening and would like to try antibiotic coverage.  Please schedule a follow-up appointment with Dr. Althea CharonKaramalegos in 1 week if not improved Bronchitis / Flu  If you have any other questions or concerns,  please feel free to call the clinic or send a message through MyChart. You may also schedule an earlier appointment if necessary.  Saralyn PilarAlexander Yvonna Brun, DO [facility]

## 2016-07-31 NOTE — Progress Notes (Signed)
Subjective:    Patient ID: Joy Ball, female    DOB: [birth year], 51 y.o.   MRN: [id number]  Joy Ball is a 51 y.o. female presenting on 07/31/2016 for Fever (sinus drainage ,coughing ,HA (olderst grand-daughter was recently diagnosed with flu) chills night sweats )  Patient presents for a same day appointment.  HPI   FLU-LIKE ILLNESS / BRONCHITIS vs PNEUMONIA: Reports symptoms started 3 days ago with nasal and sinus congestion, worsening to productive cough occasionally, muscle aches all over, reduced appetite. Fever recorded last night 101.7F. Tolerating some liquids but eating less food. - Never had flu before. Significant sick contact with 76 year old granddaughter with confirmed flu - Taking Tylenol x 3 about every 3-4 hours with some relief. Not tried NSAIDs. - Admits some chronic intermittent diarrhea routinely - Admits fever, chills and sweats last night, sore throat - History of COPD and hospitalization in 12/2015 with pneumonia. Active smoker and admits to some chronic shortness of breath - Denies any nausea, vomiting, abdominal pain, chest pain or pressure, dyspnea at rest  Social History  Substance Use Topics  . Smoking status: Current Every Day Smoker    Packs/day: 1.00    Years: 30.00    Types: Cigarettes  . Smokeless tobacco: Current User  . Alcohol use 0.0 oz/week     Comment: occ    Review of Systems Per HPI unless specifically indicated above     Objective:    BP 111/76   Pulse (!) 103   Temp 99.1 F (37.3 C) (Oral)   Resp 16   Ht 5\' 1"  (1.549 m)   Wt 89 lb 8 oz (40.6 kg)   SpO2 98%   BMI 16.91 kg/m   Wt Readings from Last 3 Encounters:  07/31/16 89 lb 8 oz (40.6 kg)  02/07/16 87 lb (39.5 kg)  02/04/16 87 lb (39.5 kg)    Physical Exam  Constitutional: She is oriented to person, place, and time. She appears well-developed and well-nourished. No distress.  Sick and tired-appearing but non toxic, mostly comfortable, cooperative  HENT:  Head:  Normocephalic and atraumatic.  Mild maxillary sinus tenderness. Nares with some turbinate edema with congestion without purulence. Bilateral TMs clear without erythema, effusion or bulging. Oropharynx generalized erythema without focal exudates, edema or asymmetry.  Mild dry mucus mem and tongue  Eyes: Conjunctivae are normal. Right eye exhibits no discharge. Left eye exhibits no discharge.  Neck: Normal range of motion. Neck supple.  Cardiovascular: Regular rhythm, normal heart sounds and intact distal pulses.   No murmur heard. Mild Tachycardia  Pulmonary/Chest: Effort normal. No respiratory distress. She has no wheezes.  Frequent coughing. Mildly reduced air movement with some coarse breath sounds R sided mostly lower, some improve with cough. No focal crackles. No overt exp wheezing.  Musculoskeletal: She exhibits no edema.  Lymphadenopathy:    She has no cervical adenopathy.  Neurological: She is alert and oriented to person, place, and time.  Skin: Skin is warm and dry. No rash noted. She is not diaphoretic. No erythema.  Psychiatric: Her behavior is normal.  Nursing note and vitals reviewed.   I have personally reviewed the radiology report from 07/31/16 Chest X-ray  CLINICAL DATA:  Fever and chills. Productive cough. Shortness of breath. History of COPD and asthma. History of pulmonary fibrosis.  EXAM: CHEST  2 VIEW  COMPARISON:  02/04/2016; 01/06/2012; chest CT - 09/25/2014  FINDINGS: Grossly unchanged cardiac silhouette and mediastinal contours. The lungs remain hyperexpanded  with flattening the diaphragms. Slightly coarsened nodular interstitial opacities within the bilateral lung bases are unchanged. Linear heterogeneous opacities within the peripheral aspect of the right upper lung are also unchanged. No new focal airspace opacities. No pleural effusion or pneumothorax. No evidence of edema. No acute osseus abnormalities.  IMPRESSION: 1. Stable examination  without significant interval change. 2. Stable findings of lung hyperexpansion, chronic bronchitic change right upper lobe atelectasis/scar and basilar predominant fibrosis.   Electronically Signed   By: Simonne Come M.D.   On: 07/31/2016 12:42    Results for orders placed or performed in visit on 07/31/16  POCT Influenza A/B  Result Value Ref Range   Influenza A, POC Negative Negative   Influenza B, POC Negative Negative      Assessment & Plan:   Problem List Items Addressed This Visit    None    Visit Diagnoses    Fever and chills    -  Primary - Concern possible flu, check rapid flu (negative) - cannot rule out PNA, check CXR, see results    Relevant Medications   oseltamivir (TAMIFLU) 75 MG capsule   Other Relevant Orders   POCT Influenza A/B (Completed)   DG Chest 2 View (Completed)   Influenza     Clinically diagnosed influenza despite negative rapid flu test today, concern for flu still due to significant known exposure to positive influenza - Duration x 3 days. However concern for possible underlying pneumonia in setting of COPD without active wheezing. Tolerating PO with some mild dehydration - Did not receive influenza vaccine this season  Plan: 1. Start Tamiflu 75mg  capsules BID x 5 days 2. Supportive care as advised with NSAID / Tylenol PRN fever/myalgias, improve hydration, may take OTC Cold/Flu meds 3. Tessalon PRN cough, Flonase for rhinitis 4. Check STAT CXR today given chronic lung disease and history of PNA, also with some coarse breath sounds and febrile illness without active wheezing - UPDATE reviewed CXR results without focal infiltrate or PNA, but some stable chronic findings with scarring, atx, and chronic bronchitic changes 5. Return criteria given if significant worsening, consider post-influenza complications, otherwise follow-up if needed      Relevant Medications   oseltamivir (TAMIFLU) 75 MG capsule   benzonatate (TESSALON) 100 MG  capsule   Other Relevant Orders   DG Chest 2 View (Completed)   Acute bronchitis with COPD (HCC)    Consistent with worsening bronchitis in setting of likely viral URI vs influenza clinically (+sick contact), also likely allergic rhinitis component - Known complicated COPD, however without overt wheezing today - CXR negative for PNA  Plan: 1. Hold prednisone, treating empiric flu with tamiflu, supportive care, tessalon, flonase 2. Continue inhalers / and nebulizer with frequent use for 3-5 days then PRN 3. Future if not improved consider azithromycin vs levaquin within 24 hours       Relevant Medications   benzonatate (TESSALON) 100 MG capsule   fluticasone (FLONASE) 50 MCG/ACT nasal spray   Other Relevant Orders   DG Chest 2 View (Completed)   Upper airway cough syndrome       Relevant Medications   fluticasone (FLONASE) 50 MCG/ACT nasal spray      Meds ordered this encounter  Medications  . oseltamivir (TAMIFLU) 75 MG capsule    Sig: Take 1 capsule (75 mg total) by mouth 2 (two) times daily. For 5 days    Dispense:  10 capsule    Refill:  0  . benzonatate (TESSALON) 100 MG  capsule    Sig: TAKE 1 CAPSULE(100 MG) BY MOUTH THREE TIMES DAILY AS NEEDED    Dispense:  30 capsule    Refill:  0  . fluticasone (FLONASE) 50 MCG/ACT nasal spray    Sig: Place 2 sprays into both nostrils daily.    Dispense:  16 g    Refill:  11    Follow up plan: Return in about 1 week (around 08/07/2016), or if symptoms worsen or fail to improve, for bronchitis flu.  Saralyn PilarAlexander Alvie Speltz, DO [facility] 07/31/2016, 5:04 PM

## 2016-08-25 ENCOUNTER — Other Ambulatory Visit: Payer: Self-pay | Admitting: Family Medicine

## 2016-08-25 DIAGNOSIS — R1011 Right upper quadrant pain: Secondary | ICD-10-CM

## 2016-08-25 MED ORDER — RANITIDINE HCL 150 MG PO CAPS: 150.0000 mg | ORAL_CAPSULE | Freq: Two times a day (BID) | ORAL | 6 refills | Status: DC

## 2016-09-26 ENCOUNTER — Ambulatory Visit
Admission: EM | Admit: 2016-09-26 | Discharge: 2016-09-26 | Disposition: A | Discharge status: Home / Self Care | Payer: BLUE CROSS/BLUE SHIELD | Attending: Emergency Medicine | Admitting: Emergency Medicine

## 2016-09-26 ENCOUNTER — Encounter: Payer: Self-pay | Admitting: *Deleted

## 2016-09-26 DIAGNOSIS — L299 Pruritus, unspecified: Secondary | ICD-10-CM | POA: Diagnosis not present

## 2016-09-26 DIAGNOSIS — K130 Diseases of lips: Secondary | ICD-10-CM

## 2016-09-26 MED ORDER — HYDROCORTISONE 2.5 % EX OINT: TOPICAL_OINTMENT | Freq: Two times a day (BID) | CUTANEOUS | 0 refills | Status: DC

## 2016-09-26 NOTE — ED Provider Notes (Signed)
HPI  SUBJECTIVE:  Joy Ball is a 51 y.o. female who presents with itching on her left torso underneath her breast that she states extends to the back starting last night. She reports a red, splotchy rash that has since resolved. She states that it continues to itch. Tried Claritin with some improvement of symptoms. No aggravating factors. No vesicular rash, burning, tingling, new lotions, soaps, detergents. No change in medications or new foods. No flulike symptoms, bodyaches, headaches, fevers. She is never had symptoms like this before. She is concerned because she is a contact has shingles. She also reports bilateral burning in her lips starting earlier today. States her lips "feel chapped". She denies licking her lips, change in lip balm or lipsticks. States that she uses Carmex only. No aggravating or alleviating factors. She has not tried anything for this.no  Exposure to chemicals, peppers, lip swelling, rash around her lips or on her lips. She denies ear facial pain, facial rash. No symptoms like this before. She has a past history of asthma, COPD, pulmonary fibrosis, chickenpox. She continues to smoke. No history of diabetes, hypertension. LMP: 8 years ago. PMD: Dr. Juanetta Gosling.   Past Medical History:  Diagnosis Date  . Asthma   . COPD (chronic obstructive pulmonary disease) (HCC)   . Emphysema lung (HCC)   . GERD (gastroesophageal reflux disease)   . Migraines   . Seasonal allergies     Past Surgical History:  Procedure Laterality Date  . CESAREAN SECTION    . TUBAL LIGATION      Family History  Problem Relation Age of Onset  . Lung cancer Father     was a smoker  . Heart disease Maternal Grandfather   . Emphysema Mother     Social History  Substance Use Topics  . Smoking status: Current Every Day Smoker    Packs/day: 1.00    Years: 30.00    Types: Cigarettes  . Smokeless tobacco: Never Used  . Alcohol use 0.0 oz/week     Comment: occ    No current  facility-administered medications for this encounter.   Current Outpatient Prescriptions:  .  acetaminophen (TYLENOL) 500 MG tablet, Take 1,500 mg by mouth 3 (three) times daily. 2 to 3 times a day., Disp: , Rfl:  .  albuterol (VENTOLIN HFA) 108 (90 Base) MCG/ACT inhaler, Inhale 2 puffs into the lungs every 6 (six) hours as needed for wheezing or shortness of breath., Disp: 18 g, Rfl: 3 .  benzonatate (TESSALON) 100 MG capsule, TAKE 1 CAPSULE(100 MG) BY MOUTH THREE TIMES DAILY AS NEEDED, Disp: 30 capsule, Rfl: 0 .  budesonide (PULMICORT) 0.25 MG/2ML nebulizer solution, Take 2 mLs (0.25 mg total) by nebulization every 12 (twelve) hours., Disp: 60 mL, Rfl: 12 .  fluticasone (FLONASE) 50 MCG/ACT nasal spray, Place 2 sprays into both nostrils daily., Disp: 16 g, Rfl: 11 .  loperamide (IMODIUM) 2 MG capsule, TAKE 1 CAPSULE BY MOUTH AS NEEDED FOR DIARRHEA OR LOOSE STOOLS, Disp: 30 capsule, Rfl: 0 .  loratadine (CLARITIN) 10 MG tablet, Take 10 mg by mouth daily., Disp: , Rfl:  .  omeprazole (PRILOSEC) 20 MG capsule, Take 20 mg by mouth daily., Disp: , Rfl: 6 .  ondansetron (ZOFRAN) 4 MG tablet, Take 1 tablet (4 mg total) by mouth 3 (three) times daily as needed., Disp: 20 tablet, Rfl: 1 .  hydrocortisone 2.5 % ointment, Apply topically 2 (two) times daily., Disp: 30 g, Rfl: 0 .  ibuprofen (ADVIL,MOTRIN) 800 MG  tablet, Take 800 mg by mouth as needed., Disp: , Rfl:  .  nicotine (NICODERM CQ - DOSED IN MG/24 HOURS) 14 mg/24hr patch, Apply once daily to the skin for 2 weeks after completing the 21 mg/hr patch., Disp: 14 patch, Rfl: 0 .  nicotine (NICODERM CQ - DOSED IN MG/24 HOURS) 21 mg/24hr patch, Place 1 patch (21 mg total) onto the skin daily., Disp: 28 patch, Rfl: 0 .  pseudoephedrine (SUDAFED) 30 MG tablet, Take 1 tablet (30 mg total) by mouth every 8 (eight) hours as needed for congestion., Disp: 30 tablet, Rfl: 0 .  ranitidine (ZANTAC) 150 MG capsule, Take 1 capsule (150 mg total) by mouth 2 (two)  times daily., Disp: 60 capsule, Rfl: 6  Allergies  Allergen Reactions  . Amoxicillin Hives     ROS  As noted in HPI.   Physical Exam  BP (!) 116/53 (BP Location: Left Arm)   Pulse 85   Temp 98.2 F (36.8 C)   Resp 16   Ht  (1.549 m)   Wt 88 lb (39.9 kg)   SpO2 98%   BMI 16.63 kg/m   Constitutional: Well developed, well nourished, no acute distress Eyes:  EOMI, conjunctiva normal bilaterally HENT: Normocephalic, atraumatic,mucus membranes moist. Dry lips, no cracking, peeling. No cracking and corners of mouth. No periorbital redness or swelling. Respiratory: Normal inspiratory effort Cardiovascular: Normal rate GI: nondistended skin: No erythema, redness, tenderness excoriations vesicles or other rash and the area where she states the rash was last night. It is in a thoracic left-sided dermatomal distribution. Musculoskeletal: no deformities Neurologic: Alert & oriented x 3, no focal neuro deficits Psychiatric: Speech and behavior appropriate   ED Course   Medications - No data to display  No orders of the defined types were placed in this encounter.   No results found for this or any previous visit (from the past 24 hour(s)). No results found.  ED Clinical Impression  Itching  Dry lips  ED Assessment/Plan  Discussed with patient that this could be early shingles however it is difficult to make the diagnosis until the rash shows up. We'll have her continue Claritin and also give her hydrocortisone 2.5% ointment for her to put in areas that itching including her lips. Advised her to continue Carmex and the mindful of licking her lips. Does not appear to be any angioedema or infection this time. She'll follow-up with her primary care physician or here if a rash shows up on her torso and will treat for shingles at that time.  Discussed MDM, plan and followup with patient. Patient agrees with plan.   Meds ordered this encounter  Medications  .  hydrocortisone 2.5 % ointment    Sig: Apply topically 2 (two) times daily.    Dispense:  30 g    Refill:  0    *This clinic note was created using Scientist, clinical (histocompatibility and immunogenetics). Therefore, there may be occasional mistakes despite careful proofreading.  ?   Domenick Gong, MD 09/26/16 [phone]

## 2016-09-26 NOTE — ED Triage Notes (Signed)
Patient started having symptoms of abdominal rash last PM. Today she feels itchy all over and her lips started burning 1 hour before arrival.

## 2016-09-26 NOTE — Discharge Instructions (Signed)
You may also take Benadryl at night in addition to the Claritin.

## 2016-10-29 ENCOUNTER — Ambulatory Visit (INDEPENDENT_AMBULATORY_CARE_PROVIDER_SITE_OTHER): Payer: BLUE CROSS/BLUE SHIELD | Admitting: Family Medicine

## 2016-10-29 ENCOUNTER — Encounter: Payer: Self-pay | Admitting: Family Medicine

## 2016-10-29 VITALS — BP 118/74 | HR 84 | Temp 98.5°F | Resp 16 | Ht 61.0 in | Wt 95.0 lb

## 2016-10-29 DIAGNOSIS — K591 Functional diarrhea: Secondary | ICD-10-CM

## 2016-10-29 DIAGNOSIS — K625 Hemorrhage of anus and rectum: Secondary | ICD-10-CM | POA: Diagnosis not present

## 2016-10-29 DIAGNOSIS — M545 Low back pain, unspecified: Secondary | ICD-10-CM

## 2016-10-29 DIAGNOSIS — Z8719 Personal history of other diseases of the digestive system: Secondary | ICD-10-CM | POA: Diagnosis not present

## 2016-10-29 LAB — CBC WITH DIFFERENTIAL/PLATELET
BASOS ABS: 0 {cells}/uL (ref 0–200)
Basophils Relative: 0 %
EOS PCT: 2 %
Eosinophils Absolute: 184 cells/uL (ref 15–500)
HCT: 39.3 % (ref 35.0–45.0)
HEMOGLOBIN: 13.6 g/dL (ref 11.7–15.5)
LYMPHS ABS: 2300 {cells}/uL (ref 850–3900)
LYMPHS PCT: 25 %
MCH: 35.1 pg — AB (ref 27.0–33.0)
MCHC: 34.6 g/dL (ref 32.0–36.0)
MCV: 101.3 fL — ABNORMAL HIGH (ref 80.0–100.0)
MONOS PCT: 5 %
MPV: 8.8 fL (ref 7.5–12.5)
Monocytes Absolute: 460 cells/uL (ref 200–950)
NEUTROS PCT: 68 %
Neutro Abs: 6256 cells/uL (ref 1500–7800)
Platelets: 404 10*3/uL — ABNORMAL HIGH (ref 140–400)
RBC: 3.88 MIL/uL (ref 3.80–5.10)
RDW: 13.8 % (ref 11.0–15.0)
WBC: 9.2 10*3/uL (ref 3.8–10.8)

## 2016-10-29 MED ORDER — BACLOFEN 10 MG PO TABS: 5.0000 mg | ORAL_TABLET | Freq: Three times a day (TID) | ORAL | 0 refills | Status: DC | PRN

## 2016-10-29 NOTE — Assessment & Plan Note (Signed)
Concern with intermittent BRBPR and some reported darker stools with melena (diarrhea mostly) currently acute episode now 2-3 days worse than prior episodes, but has had occasional bleeding intermittently >1 year. Complicated by chronic diarrhea, abdominal cramping. - No known family history of colorectal cancer. Prior colonoscopy 2016 without reported polyps - Differential is broad can consider internal hemorrhoids (less likely external without rectal pain or protruding hemorrhoid), also possible diverticular bleed seems less likely to be chronic, could be AVM or other vascular etiology - Hemodynamically stable at this time, without tachycardia, normal BP, clinically well, less concern for anemia but certainly possibly with chronic blood loss  Plan: 1. Offered FOBT - declined, since reportedly confirmed bleeding 2. Urgent referral placed to [facility] Dr Servando SnareWohl or first available Dr Tobi BastosAnna for GI evaluation and re-establish care may need further diagnostics including repeat colonoscopy if indicated 3. Hold NSAIDs. May take Tylenol PRN for back 4. Check CBC, BMET today 5. Follow-up as needed, after see GI, return criteria given if symptoms of anemia or worsening persistent bleeding

## 2016-10-29 NOTE — Assessment & Plan Note (Signed)
Stable chronic problem most days 1-2 times daily without well formed stool or any intermittent constipation, no clear etiology, has been evaluated by GI in 2016 had colonoscopy, do not have results or record available, she was lost to follow-up due to lack of ins, now has insurance again. - Currently with acute rectal bleeding as main concern, may be related or incidental to chronic diarrhea - Urgent referral back to [facility] Dr Servando SnareWohl for re-establish and consider other diagnostics

## 2016-10-29 NOTE — Patient Instructions (Signed)
Thank you for coming to the clinic today.  Labs today to check if anemic, will notify you within 24 hours  Urgent referral to GI for re-establish with Dr Servando SnareWohl or Dr Tobi BastosAnna to evaluate rectal bleeding and diarrhea, also may need update colonoscopy. Also this may be possible Diverticular bleeding if this was on your prior colonoscopy, if you get sick with fever/chills, nausea, vomiting, or lightheaded, dizzy, near passing out, shortness of breath may need to go directly to hospital ED for further evaluation may need antibiotics or blood transfusion if this is severe or worsening.  [facility] (GI) [address] [place], [place] [address] Phone: [phone]  Midge Miniumarren Wohl, MD Wyline MoodKiran Anna, MD (accepting new patients)  ------------------------------   1. For your Back Pain - I think that this is due to Muscle Spasms or strain.   Start Baclofen (Lioresal) 10mg  tablets - cut in half for 5mg  at night for muscle relaxant - may make you sedated or sleepy (be careful driving or working on this) if tolerated you can take every 8 hours, half or whole tab - May use Tylenol Extra Str 500mg  tabs - may take 1-2 tablets every 6 hours as needed - Recommend to start using heating pad on your lower back 1-2x daily for few weeks  This pain may take weeks to months to fully resolve, but hopefully it will respond to the medicine initially. All back injuries (small or serious) are slow to heal since we use our back muscles every day. Be careful with turning, twisting, lifting, sitting / standing for prolonged periods, and avoid re-injury.  If your symptoms significantly worsen with more pain, or new symptoms with weakness in one or both legs, new or different shooting leg pains, numbness in legs or groin, loss of control or retention of urine or bowel movements, please call back for advice and you may need to go directly to the Emergency Department.  Please schedule a follow-up  appointment with Dr. Althea CharonKaramalegos in 4-6 weeks for low back pain if not improved, as needed for rectal bleeding, follow-up with GI first  If you have any other questions or concerns, please feel free to call the clinic or send a message through MyChart. You may also schedule an earlier appointment if necessary.  Saralyn PilarAlexander Karamalegos, DO [facility]

## 2016-10-29 NOTE — Progress Notes (Signed)
Subjective:    Patient ID: Joy Ball, female    DOB: 08/16/1965, 51 y.o.   MRN: [id number]  Joy MassonMelissa S Ball is a 51 y.o. female presenting on 10/29/2016 for Rectal Bleeding (onset 2 days lot in toilet as per patinet and back pain)  Patient presents for a same day appointment.  HPI   RECTAL BLEEDING / CHRONIC DIARRHEA: Reports chronic history of diarrhea for years, previously evaluated by prior PCP Amy Krebs and referred to Gastroenterology to Dr Servando SnareWohl in 2016, had colonoscopy, (do not have available results but she reports no significant problem, possibly was told had some "diverticulosis"). Also has had chronic rectal bleeding intermittently over past 1 year - Today here for acute episode of worsening rectal bleeding. Started few days ago, her diarrhea occurs most days usually worse in morning and evenings, and about 2-3 days ago had episode of bright red rectal bleeding following diarrhea in evening, with significant amount of bright red blood in stool and some mixed clots, also with some darker stools - Also admits abdominal LLQ cramping usually following meals and worse with diarrhea - Admits melena with dark stools (but rarely well formed), some abdominal pain - No known family history of colon cancer. Has some chronic night sweats thought to be post menopausal. Also poor appetite and gained some weight recently not unintentional wt loss - Denies any constipation or straining with bowel movements, fevers chills, active abdominal pain, nausea, vomiting  LOW BACK PAIN, Acute Left - Reports symptoms started about 2-3 days ago without inciting injury. She does not have a known history of chronic back pain. Today seems to be persistent without significant resolution but not worse. Describes pain as dull aching occasional sharp pain with movements, mild to moderate severity with intermittent worsening if prolonged sitting and sit up or twist wrong way. No pain radiating to legs. - Not taking  Tylenol or NSAIDs - Tried heating pad at night with some relief - History of no known lumbar OA/DJD - Denies any numbness, tingling, weakness  Social History  Substance Use Topics  . Smoking status: Current Every Day Smoker    Packs/day: 1.00    Years: 30.00    Types: Cigarettes  . Smokeless tobacco: Current User  . Alcohol use 0.0 oz/week     Comment: occ    Review of Systems Per HPI unless specifically indicated above     Objective:    BP 118/74   Pulse 84   Temp 98.5 F (36.9 C) (Oral)   Resp 16   Ht 5\' 1"  (1.549 m)   Wt 95 lb (43.1 kg)   BMI 17.95 kg/m   Wt Readings from Last 3 Encounters:  10/29/16 95 lb (43.1 kg)  09/26/16 88 lb (39.9 kg)  07/31/16 89 lb 8 oz (40.6 kg)    Physical Exam  Constitutional: She is oriented to person, place, and time. She appears well-developed and well-nourished. No distress.  Well but thin appearing, comfortable, cooperative  HENT:  Head: Normocephalic and atraumatic.  Mouth/Throat: Oropharynx is clear and moist.  Cardiovascular: Normal rate, regular rhythm, normal heart sounds and intact distal pulses.   No murmur heard. Pulmonary/Chest: Effort normal and breath sounds normal. No respiratory distress. She has no wheezes. She has no rales.  Abdominal: Soft. Bowel sounds are normal. She exhibits no distension and no mass. There is no tenderness. There is no rebound.  Genitourinary:  Genitourinary Comments: Declined DRE or external rectal exam today  Musculoskeletal:  Low  Back Inspection: Normal appearance, thin body habitus, no spinal deformity, symmetrical. Palpation: No tenderness over spinous processes. Bilateral lumbar paraspinal muscles non-tender to touch, but L > R with hypertonicity/spasm ROM: Mostly full active ROM forward flex / back extension, rotation L/R without discomfort, some stiffness and tightening with forward flexion and rotation Special Testing: Seated SLR negative for radicular pain bilaterally  Strength:  Bilateral hip flex/ext 5/5, knee flex/ext 5/5, ankle dorsiflex/plantarflex 5/5 Neurovascular: intact distal sensation to light touch  Neurological: She is alert and oriented to person, place, and time.  Skin: Skin is warm and dry. No rash noted. She is not diaphoretic. No erythema.  Psychiatric: She has a normal mood and affect. Her behavior is normal.  Nursing note and vitals reviewed.      Assessment & Plan:   Problem List Items Addressed This Visit    Rectal bleeding    Concern with intermittent BRBPR and some reported darker stools with melena (diarrhea mostly) currently acute episode now 2-3 days worse than prior episodes, but has had occasional bleeding intermittently >1 year. Complicated by chronic diarrhea, abdominal cramping. - No known family history of colorectal cancer. Prior colonoscopy 2016 without reported polyps - Differential is broad can consider internal hemorrhoids (less likely external without rectal pain or protruding hemorrhoid), also possible diverticular bleed seems less likely to be chronic, could be AVM or other vascular etiology - Hemodynamically stable at this time, without tachycardia, normal BP, clinically well, less concern for anemia but certainly possibly with chronic blood loss  Plan: 1. Offered FOBT - declined, since reportedly confirmed bleeding 2. Urgent referral placed to [facility] Dr Servando Snare or first available Dr Tobi Bastos for GI evaluation and re-establish care may need further diagnostics including repeat colonoscopy if indicated 3. Hold NSAIDs. May take Tylenol PRN for back 4. Check CBC, BMET today 5. Follow-up as needed, after see GI, return criteria given if symptoms of anemia or worsening persistent bleeding      Relevant Orders   CBC with Differential/Platelet   BASIC METABOLIC PANEL WITH GFR   Ambulatory referral to Gastroenterology   Functional diarrhea - Primary    Stable chronic problem most days 1-2 times daily without well formed stool or any  intermittent constipation, no clear etiology, has been evaluated by GI in 2016 had colonoscopy, do not have results or record available, she was lost to follow-up due to lack of ins, now has insurance again. - Currently with acute rectal bleeding as main concern, may be related or incidental to chronic diarrhea - Urgent referral back to [facility] Dr Servando Snare for re-establish and consider other diagnostics      Relevant Orders   BASIC METABOLIC PANEL WITH GFR   Ambulatory referral to Gastroenterology    Other Visit Diagnoses    History of melena       Relevant Orders   CBC with Differential/Platelet   Ambulatory referral to Gastroenterology   Acute left-sided low back pain without sciatica      Acute L LBP without associated sciatica. Suspect likely due to muscle spasm/strain, without known injury or trauma.  - No red flag symptoms. Negative SLR for radiculopathy - Inadequate conservative therapy  Plan: 1. Hold NSAIDs due to bleeding. 2. Start muscle relaxant with Baclofen 10mg  tabs - take 5-10mg  up to TID PRN, titrate up as tolerated 3. May use Tylenol PRN for breakthrough 4. Encouraged use of heating pad 1-2x daily for now then PRN 5. Follow-up 4-6 weeks if not resolved. Re-evaluation. If not improved  consider X-ray imaging given >6 weeks. Consider trial of PT for strengthening    Relevant Medications   baclofen (LIORESAL) 10 MG tablet      Meds ordered this encounter  Medications  . baclofen (LIORESAL) 10 MG tablet    Sig: Take 0.5-1 tablets (5-10 mg total) by mouth 3 (three) times daily as needed for muscle spasms.    Dispense:  30 each    Refill:  0    Follow up plan: Return in about 4 weeks (around 11/26/2016), or if symptoms worsen or fail to improve, for low back pain or rectal bleeding.  Saralyn Pilar, DO [facility] 10/29/2016, 12:23 PM

## 2016-10-30 LAB — BASIC METABOLIC PANEL WITH GFR
BUN: 8 mg/dL (ref 7–25)
CHLORIDE: 105 mmol/L (ref 98–110)
CO2: 21 mmol/L (ref 20–31)
CREATININE: 0.64 mg/dL (ref 0.50–1.10)
Calcium: 8.9 mg/dL (ref 8.6–10.2)
GFR, Est African American: >89 mL/min (ref >=60)
GFR, Est Non African American: >89 mL/min (ref >=60)
Glucose, Bld: 86 mg/dL (ref 65–99)
POTASSIUM: 3.5 mmol/L (ref 3.5–5.3)
SODIUM: 139 mmol/L (ref 135–146)

## 2016-11-26 ENCOUNTER — Other Ambulatory Visit: Payer: Self-pay | Admitting: Family Medicine

## 2016-11-26 DIAGNOSIS — M545 Low back pain, unspecified: Secondary | ICD-10-CM

## 2016-12-08 ENCOUNTER — Encounter: Payer: Self-pay | Admitting: Gastroenterology

## 2016-12-08 ENCOUNTER — Ambulatory Visit: Payer: BLUE CROSS/BLUE SHIELD | Admitting: Gastroenterology

## 2016-12-08 ENCOUNTER — Other Ambulatory Visit: Payer: Self-pay

## 2017-01-28 ENCOUNTER — Encounter: Payer: Self-pay | Admitting: Family Medicine

## 2017-01-28 ENCOUNTER — Ambulatory Visit (INDEPENDENT_AMBULATORY_CARE_PROVIDER_SITE_OTHER): Payer: BLUE CROSS/BLUE SHIELD | Admitting: Family Medicine

## 2017-01-28 VITALS — BP 118/74 | HR 86 | Temp 98.8°F | Resp 16 | Ht 61.0 in | Wt 93.8 lb

## 2017-01-28 DIAGNOSIS — J0100 Acute maxillary sinusitis, unspecified: Secondary | ICD-10-CM | POA: Diagnosis not present

## 2017-01-28 DIAGNOSIS — J449 Chronic obstructive pulmonary disease, unspecified: Secondary | ICD-10-CM

## 2017-01-28 DIAGNOSIS — B370 Candidal stomatitis: Secondary | ICD-10-CM | POA: Diagnosis not present

## 2017-01-28 NOTE — Progress Notes (Signed)
Subjective:    Patient ID: Joy Ball, female    DOB: December 23, 1965, 51 y.o.   MRN: [id number]  Joy Ball is a 51 y.o. female presenting on 01/28/2017 for Nasal Congestion (cough chest congestion onset 3 days low grade fever 100.1 F yesterday)  Patient presents for a same day appointment. Note that patient encounter completed without access to [facility], only back-up patient chart information. Patient was provided handwritten rx for meds prescribed today. This note was written after visit.  HPI   SINUSITIS / COPD Reports history of worsening sinusitis and cough for past 4 days, onset with initial sinus pain and pressure, led to congestion of sinuses and more cough, not as productive difficulty clearing, has worse sinus pain / pressure and cough at night. Also admits some chest tightness only with coughing spells. Sick contacts with grandchildren at home similar URI symptoms. Her last illness 07/2016 treated for influenza empirically. She has taken Levaquin before for similar problem with good results. - Has COPD, using albuterol at home with temporary relief, also has pulmicort inhaler - Additional complaint with "mouth burning" worse with PO intake, has had before when uses inhaler too much - Admits fever at home up to 101 - Admits nausea without vomiting and generalized weakness, sore throat - Denies active chest pain, vomiting, headache, dyspnea at rest, hemoptysis, abdominal pain, nasal purulence, ear pain  Social History  Substance Use Topics  . Smoking status: Current Every Day Smoker    Packs/day: 1.00    Years: 30.00    Types: Cigarettes  . Smokeless tobacco: Current User  . Alcohol use 0.0 oz/week     Comment: occ    Review of Systems Per HPI unless specifically indicated above     Objective:    BP 118/74   Pulse 86   Temp 98.8 F (37.1 C) (Oral)   Resp 16   Ht 5' 1"  (1.549 m)   Wt 93 lb 12.8 oz (42.5 kg)   SpO2 99%   BMI 17.72 kg/m   Wt Readings from Last 3  Encounters:  01/28/17 93 lb 12.8 oz (42.5 kg)  10/29/16 95 lb (43.1 kg)  09/26/16 88 lb (39.9 kg)    Physical Exam  Constitutional: She is oriented to person, place, and time. She appears well-developed and well-nourished. No distress.  Mildly ill appearing, slightly uncomfortable, cooperative  HENT:  Head: Normocephalic and atraumatic.  Mouth/Throat: Oropharynx is clear and moist.  Bilateral frontal and maxillary sinuses slightly ttender. Nares with some turbinate edema with congestion without purulence. Bilateral TMs clear without erythema, effusion or bulging. Oropharynx clear except patchy appearance to tongue with some evidence of white film vs exudate, also irritation of top denture.  Eyes: Conjunctivae are normal. Right eye exhibits no discharge. Left eye exhibits no discharge.  Neck: Normal range of motion. Neck supple. No thyromegaly present.  Cardiovascular: Normal rate, regular rhythm, normal heart sounds and intact distal pulses.   No murmur heard. Pulmonary/Chest: Effort normal. No respiratory distress. She has no wheezes. She has no rales.  Frequent coughing. Speaks full sentences. Reduced air movement diffusely no focal abnormality  Musculoskeletal: Normal range of motion. She exhibits no edema.  Lymphadenopathy:    She has no cervical adenopathy.  Neurological: She is alert and oriented to person, place, and time.  Skin: Skin is warm and dry. No rash noted. She is not diaphoretic. No erythema.  Psychiatric: Her behavior is normal.  Nursing note and vitals reviewed.  Results  for orders placed or performed in visit on 10/29/16  CBC with Differential/Platelet  Result Value Ref Range   WBC 9.2 3.8 - 10.8 K/uL   RBC 3.88 3.80 - 5.10 MIL/uL   Hemoglobin 13.6 11.7 - 15.5 g/dL   HCT 39.3 35.0 - 45.0 %   MCV 101.3 (H) 80.0 - 100.0 fL   MCH 35.1 (H) 27.0 - 33.0 pg   MCHC 34.6 32.0 - 36.0 g/dL   RDW 13.8 11.0 - 15.0 %   Platelets 404 (H) 140 - 400 K/uL   MPV 8.8 7.5 - 12.5  fL   Neutro Abs 6,256 1,500 - 7,800 cells/uL   Lymphs Abs 2,300 850 - 3,900 cells/uL   Monocytes Absolute 460 200 - 950 cells/uL   Eosinophils Absolute 184 15 - 500 cells/uL   Basophils Absolute 0 0 - 200 cells/uL   Neutrophils Relative % 68 %   Lymphocytes Relative 25 %   Monocytes Relative 5 %   Eosinophils Relative 2 %   Basophils Relative 0 %   Smear Review Criteria for review not met   BASIC METABOLIC PANEL WITH GFR  Result Value Ref Range   Sodium 139 135 - 146 mmol/L   Potassium 3.5 3.5 - 5.3 mmol/L   Chloride 105 98 - 110 mmol/L   CO2 21 20 - 31 mmol/L   Glucose, Bld 86 65 - 99 mg/dL   BUN 8 7 - 25 mg/dL   Creat 0.64 0.50 - 1.10 mg/dL   Calcium 8.9 8.6 - 10.2 mg/dL   GFR, Est African American >89 >=60 mL/min   GFR, Est Non African American >89 >=60 mL/min      Assessment & Plan:   Problem List Items Addressed This Visit    Asthma with COPD ([place])    Possible acute on chronic underlying respiratory illness with known asthma COPD, moderate to severe with active smoker - Increased use of Albuterol and pulmicort inhalers - Hemodynamically stable, POX99% on RA, no focal crackles or wheezing  Plan: 1. Given infectious concerns with febrile, sinusitis and bronchial symptoms, will cover both for sinus and COPD exac with Levaquin 528m daily x 7 days 2. Start Tessalon Perls take 1 capsule up to 3 times a day as needed for cough 3. Continue to use Albuterol PRN and Pulmicort 4. Hold prednisone burst at this time, if not improving or worsening wheezing COPD notify office 5. Note written for work 6. Return criteria and follow-up reviewed       Other Visit Diagnoses    Acute non-recurrent maxillary sinusitis    -  Primary  Consistent with acute frontal / maxillary sinusitis, likely initially viral URI vs allergic rhinitis component with worsening concern for bacterial infection.  Plan: 1. Start Levaquin 5027mdaily x 7 days - has Amox allergy 2. Start Atrovent nasal spray  decongestant 2 sprays in each nostril up to 4 times daily for 7 days 3. Start Tessalon Perls take 1 capsule up to 3 times a day as needed for cough 4. Follow-up if not improved     Oral thrush     Suspicious for thrush with inc nebulizer use Written rx [employer] Magic Mouthwash       No orders of the defined types were placed in this encounter.     Follow up plan: Return in about 1 week (around 02/04/2017), or if symptoms worsen or fail to improve, for sinusitis/COPD.  AlNobie PutnamDOArcadia University[facility] 01/28/2017,  10:39 PM

## 2017-01-28 NOTE — Patient Instructions (Signed)
Thank you for coming to the clinic today.  Please schedule a Follow-up Appointment to: Return in about 1 week (around 02/04/2017), or if symptoms worsen or fail to improve, for sinusitis/COPD.  If you have any other questions or concerns, please feel free to call the clinic or send a message through MyChart. You may also schedule an earlier appointment if necessary.  Additionally, you may be receiving a survey about your experience at our clinic within a few days to 1 week by e-mail or mail. We value your feedback.  Saralyn PilarAlexander Dewitte Vannice, DO [facility]

## 2017-01-28 NOTE — Assessment & Plan Note (Signed)
Possible acute on chronic underlying respiratory illness with known asthma COPD, moderate to severe with active smoker - Increased use of Albuterol and pulmicort inhalers - Hemodynamically stable, POX99% on RA, no focal crackles or wheezing  Plan: 1. Given infectious concerns with febrile, sinusitis and bronchial symptoms, will cover both for sinus and COPD exac with Levaquin 500mg  daily x 7 days 2. Tessalon PRN 3. Return criteria and follow-up reviewed

## 2017-02-07 ENCOUNTER — Other Ambulatory Visit: Payer: Self-pay | Admitting: Family Medicine

## 2017-02-07 DIAGNOSIS — M545 Low back pain, unspecified: Secondary | ICD-10-CM

## 2017-03-16 ENCOUNTER — Other Ambulatory Visit: Payer: Self-pay | Admitting: Family Medicine

## 2017-03-16 DIAGNOSIS — M545 Low back pain, unspecified: Secondary | ICD-10-CM

## 2017-03-26 ENCOUNTER — Ambulatory Visit (INDEPENDENT_AMBULATORY_CARE_PROVIDER_SITE_OTHER): Payer: BLUE CROSS/BLUE SHIELD | Admitting: Nurse Practitioner

## 2017-03-26 ENCOUNTER — Encounter: Payer: Self-pay | Admitting: Nurse Practitioner

## 2017-03-26 VITALS — BP 106/72 | HR 77 | Temp 98.6°F | Ht 61.0 in | Wt 96.0 lb

## 2017-03-26 DIAGNOSIS — J449 Chronic obstructive pulmonary disease, unspecified: Secondary | ICD-10-CM | POA: Diagnosis not present

## 2017-03-26 DIAGNOSIS — J019 Acute sinusitis, unspecified: Secondary | ICD-10-CM | POA: Diagnosis not present

## 2017-03-26 MED ORDER — BUDESONIDE 0.25 MG/2ML IN SUSP: 0.2500 mg | Freq: Two times a day (BID) | RESPIRATORY_TRACT | 2 refills | Status: DC

## 2017-03-26 MED ORDER — ALBUTEROL SULFATE HFA 108 (90 BASE) MCG/ACT IN AERS: 2.0000 | INHALATION_SPRAY | Freq: Four times a day (QID) | RESPIRATORY_TRACT | 3 refills | Status: DC | PRN

## 2017-03-26 NOTE — Progress Notes (Signed)
Subjective:    Patient ID: Joy Ball, female    DOB: 03-Jan-1966, 51 y.o.   MRN: [id number]  Joy Ball is a 51 y.o. female presenting on 03/26/2017 for Cough (nasal drainage, post nasal drainage, facial pressure and bodyaches x x 4 days )   HPI Cough Onset symptoms 4 days ago.  Presents today w/ active symptoms of Cough, rhinorrhea, sinus pressure, ear pressure, muffled hearing (improving), and body aches. Has had chills and sweats since Saturday night/early am.   Cough is worst during day when active and first in am. Is impacting sleep some (only sleeping 2-3 hours per night).  Has had sinus infection 01/28/2017 w/ abx treatment Is taking tessalon perles 100 mg tid. Taking dayquil w/o relief.  Currently smokes 1.5 ppd.  Has no desire to quit.  Notes this is her stress reliever.  Has had trouble w/ thrush in past w/ inhalers for her asthma and COPD.    Social History  Substance Use Topics  . Smoking status: Current Every Day Smoker    Packs/day: 1.50    Years: 30.00    Types: Cigarettes  . Smokeless tobacco: Current User  . Alcohol use 0.0 oz/week     Comment: occ    Review of Systems Per HPI unless specifically indicated above     Objective:    BP 106/72 (BP Location: Right Arm, Patient Position: Sitting, Cuff Size: Small)   Pulse 77   Temp 98.6 F (37 C) (Oral)   Ht 5' 1" (1.549 m)   Wt 96 lb (43.5 kg)   SpO2 98%   BMI 18.14 kg/m   Wt Readings from Last 3 Encounters:  03/26/17 96 lb (43.5 kg)  01/28/17 93 lb 12.8 oz (42.5 kg)  10/29/16 95 lb (43.1 kg)    Physical Exam  Constitutional: She is oriented to person, place, and time. She appears well-developed and well-nourished. She has a sickly appearance.  HENT:  Head: Normocephalic and atraumatic.  Right Ear: Hearing, external ear and ear canal normal. No tenderness. Tympanic membrane is retracted. Tympanic membrane is not erythematous. No middle ear effusion.  Left Ear: Hearing, external ear and ear  canal normal. No tenderness. Tympanic membrane is retracted. Tympanic membrane is not erythematous.  No middle ear effusion.  Nose: Rhinorrhea and sinus tenderness present. No mucosal edema. Right sinus exhibits maxillary sinus tenderness and frontal sinus tenderness. Left sinus exhibits maxillary sinus tenderness and frontal sinus tenderness.  Mouth/Throat: Uvula is midline and mucous membranes are normal. Oropharyngeal exudate and posterior oropharyngeal edema present. No posterior oropharyngeal erythema.  cobblestoning  Neck: Normal range of motion. Neck supple. No JVD present. No tracheal deviation present. No thyromegaly present.  Cardiovascular: Normal rate, regular rhythm, S1 normal, S2 normal, intact distal pulses and normal pulses.  Exam reveals no gallop.   Pulmonary/Chest: Effort normal. She has no decreased breath sounds. She has wheezes. She has rhonchi.  Lymphadenopathy:    She has cervical adenopathy.  Neurological: She is alert and oriented to person, place, and time.  Skin: Skin is warm and dry.  Psychiatric: She has a normal mood and affect. Her behavior is normal. Judgment and thought content normal.   Results for orders placed or performed in visit on 10/29/16  CBC with Differential/Platelet  Result Value Ref Range   WBC 9.2 3.8 - 10.8 K/uL   RBC 3.88 3.80 - 5.10 MIL/uL   Hemoglobin 13.6 11.7 - 15.5 g/dL   HCT 39.3 35.0 - 45.0 %  MCV 101.3 (H) 80.0 - 100.0 fL   MCH 35.1 (H) 27.0 - 33.0 pg   MCHC 34.6 32.0 - 36.0 g/dL   RDW 13.8 11.0 - 15.0 %   Platelets 404 (H) 140 - 400 K/uL   MPV 8.8 7.5 - 12.5 fL   Neutro Abs 6,256 1,500 - 7,800 cells/uL   Lymphs Abs 2,300 850 - 3,900 cells/uL   Monocytes Absolute 460 200 - 950 cells/uL   Eosinophils Absolute 184 15 - 500 cells/uL   Basophils Absolute 0 0 - 200 cells/uL   Neutrophils Relative % 68 %   Lymphocytes Relative 25 %   Monocytes Relative 5 %   Eosinophils Relative 2 %   Basophils Relative 0 %   Smear Review  Criteria for review not met   BASIC METABOLIC PANEL WITH GFR  Result Value Ref Range   Sodium 139 135 - 146 mmol/L   Potassium 3.5 3.5 - 5.3 mmol/L   Chloride 105 98 - 110 mmol/L   CO2 21 20 - 31 mmol/L   Glucose, Bld 86 65 - 99 mg/dL   BUN 8 7 - 25 mg/dL   Creat 0.64 0.50 - 1.10 mg/dL   Calcium 8.9 8.6 - 10.2 mg/dL   GFR, Est African American >89 >=60 mL/min   GFR, Est Non African American >89 >=60 mL/min      Assessment & Plan:   Problem List Items Addressed This Visit      Respiratory   Asthma with COPD ([place]) - Primary    Acute on chronic respiratory illness with known asthma COPD, moderate to severe with active smoker. - Increased use of Albuterol and no available ICS. - Hemodynamically stable, SpO2 98% on RA, focal crackles and wheezing present today.  Plan: 1. No current concern for bacterial infection since afebrile.  Will hold antibiotics w/ callback if symptoms last > 7 days total. 2. Resume albuterol inhaler 1-2 puffs every 6 hours as needed for shortness of breath or wheezing 3. START pulmicort nebulizer treatment every 12 hours for 14 days.  Make sure to rinse, gargle and spit after treatments. 4. Followup 5-7 days as needed.      Relevant Medications   albuterol (VENTOLIN HFA) 108 (90 Base) MCG/ACT inhaler   budesonide (PULMICORT) 0.25 MG/2ML nebulizer solution    Other Visit Diagnoses    Acute rhinosinusitis     Acute illness. Fever responsive to NSAIDs and tylenol.  Symptoms not worsening. Consistent with viral illness x 4 days with no known sick contacts and no identifiable focal infections of ears, nose, throat.  Evidence of eustachian tube dysfunction.  Plan: 1. Reassurance, likely self-limited with cough lasting up to few weeks - Continue Atrovent nasal spray decongestant 2 sprays each nostril up to 4 times daily for 5-7 days - Start anti-histamine Loratadine 69m daily,  - also can use Flonase 2 sprays each nostril daily for up to 4-6 weeks - Start  Mucinex-DM OTC up to 7-10 days then stop 2. Supportive care with nasal saline, warm herbal tea with honey, 3. Improve hydration 4. Tylenol / Motrin PRN fevers 5. Encouraged smoking cessation. 6. If repeat sinusitis/uri may benefit from ENT referral for evaluation of cause. 7. Return criteria given   Relevant Medications   ipratropium (ATROVENT) 0.06 % nasal spray      Meds ordered this encounter  Medications  . ipratropium (ATROVENT) 0.06 % nasal spray    Refill:  0  . albuterol (VENTOLIN HFA) 108 (90 Base) MCG/ACT inhaler  Sig: Inhale 2 puffs into the lungs every 6 (six) hours as needed for wheezing or shortness of breath.    Dispense:  18 g    Refill:  3    Order Specific Question:   Supervising Provider    Answer:   Olin Hauser [[id number]]  . budesonide (PULMICORT) 0.25 MG/2ML nebulizer solution    Sig: Take 2 mLs (0.25 mg total) by nebulization every 12 (twelve) hours.    Dispense:  60 mL    Refill:  2    Order Specific Question:   Supervising Provider    Answer:   Olin Hauser [[id number]]      Follow up plan: Return if symptoms worsen or fail to improve.  Cassell Smiles, DNP, AGPCNP-BC Adult Gerontology Primary Care Nurse Practitioner [facility] 04/03/2017, 3:04 PM

## 2017-03-26 NOTE — Patient Instructions (Addendum)
[name], Thank you for coming in to clinic today.  1. It sounds like you have a Upper Respiratory Virus - this will most likely run it's course in 7 to 10 days. Recommend good hand washing. - Continue Atrovent nasal spray decongestant 2 sprays each nostril up to 4 times daily for 5-7 days - START anti-histamine loratadine or cetirizine  daily - also can use Flonase 2 sprays each nostril daily for up to 4-6 weeks - If congestion is worse, start OTC Mucinex (or may try Mucinex-DM for cough) up to 7-10 days then stop - Drink plenty of fluids to improve congestion - You may try over the counter Nasal Saline spray (Simply Saline, Ocean Spray) as needed to reduce congestion. - Drink warm herbal tea with honey for sore throat. - Start taking Tylenol extra strength 1 to 2 tablets every 6-8 hours for aches or fever/chills for next few days as needed.  Do not take more than 3,000 mg in 24 hours from all medicines.  May take Ibuprofen as well if tolerated 200-400mg  every 8 hours as needed.   - albuterol inhaler 1-2 puffs every 6 hours as needed for shortness of breath or wheezing - START pulmicort nebulizer treatment every 12 hours for 14 days.  Make sure to rinse, gargle and spit after treatments.  If symptoms significantly worsening with persistent fevers/chills despite tylenol/ibpurofen, nausea, vomiting unable to tolerate food/fluids or medicine, body aches, or shortness of breath, sinus pain pressure or worsening productive cough, then follow-up for re-evaluation, may seek more immediate care at Urgent Care or ED if more concerned for emergency.   Please schedule a follow-up appointment with Wilhelmina Mcardle, AGNP. Return if symptoms worsen or fail to improve.  If you have any other questions or concerns, please feel free to call the clinic or send a message through MyChart. You may also schedule an earlier appointment if necessary.  You will receive a survey after today's visit either digitally  by e-mail or paper by [employer]. Your experiences and feedback matter to [place].  Please respond so we know how we are doing as we provide care for you.   Wilhelmina Mcardle, DNP, AGNP-BC Adult Gerontology Nurse Practitioner [facility], [facility]

## 2017-04-01 ENCOUNTER — Telehealth: Payer: Self-pay | Admitting: Family Medicine

## 2017-04-01 DIAGNOSIS — J019 Acute sinusitis, unspecified: Secondary | ICD-10-CM

## 2017-04-01 DIAGNOSIS — J441 Chronic obstructive pulmonary disease with (acute) exacerbation: Secondary | ICD-10-CM

## 2017-04-01 MED ORDER — PREDNISONE 50 MG PO TABS: 50.0000 mg | ORAL_TABLET | Freq: Every day | ORAL | 0 refills | Status: DC

## 2017-04-01 MED ORDER — LEVOFLOXACIN 500 MG PO TABS: 500.0000 mg | ORAL_TABLET | Freq: Every day | ORAL | 0 refills | Status: DC

## 2017-04-01 MED ORDER — BENZONATATE 100 MG PO CAPS: ORAL_CAPSULE | ORAL | 0 refills | Status: DC

## 2017-04-01 NOTE — Telephone Encounter (Signed)
Patient was seen most recently by Joy Ball, AGPCNP-BC on 03/26/17 for same issues. I saw her previously 01/28/17 at that time treated with Levaquin among other therapy, no prednisone. Called patient back today 04/01/17 approx 5pm, she reports worsening sinus pressure and congestion and drainage, still persistent productive cough and wheezing, seems similar to last time she saw me, and only limited improvement on symptomatic control from last visit 03/26/17. Agree to send in antibiotics - she has amoxicillin allergy, will re-treat with Levaquin  daily x 7 days and refill Tessalon perls that helped before, also add Prednisone burst  daily x 5 days, instead of sending rx cough syrup with codeine, do not think that is ideal therapy for current symptoms. If symptoms significantly improve, and infectious component resolves and persistent cough is present we can consider add cough syrup in future.  Patient agrees. Understands when to go to hospital ED or return if worsening.  Saralyn Pilar, DO [facility] 04/01/2017, 5:05 PM

## 2017-04-01 NOTE — Telephone Encounter (Signed)
Pt still has head congestion, cough and is not sleeping.  She was told to call back for robitussin with codeine and possible antibiotic.  She uses [facility] in [place].  Her call back number is [phone]

## 2017-04-03 ENCOUNTER — Encounter: Payer: Self-pay | Admitting: Nurse Practitioner

## 2017-04-03 NOTE — Assessment & Plan Note (Addendum)
Acute on chronic respiratory illness with known asthma COPD, moderate to severe with active smoker. - Increased use of Albuterol and no available ICS. - Hemodynamically stable, SpO2 98% on RA, focal crackles and wheezing present today.  Plan: 1. No current concern for bacterial infection since afebrile.  Will hold antibiotics w/ callback if symptoms last > 7 days total. 2. Resume albuterol inhaler 1-2 puffs every 6 hours as needed for shortness of breath or wheezing 3. START pulmicort nebulizer treatment every 12 hours for 14 days.  Make sure to rinse, gargle and spit after treatments. 4. Followup 5-7 days as needed.

## 2017-04-28 ENCOUNTER — Other Ambulatory Visit: Payer: Self-pay | Admitting: Family Medicine

## 2017-04-28 DIAGNOSIS — J441 Chronic obstructive pulmonary disease with (acute) exacerbation: Secondary | ICD-10-CM

## 2017-05-17 IMAGING — DX DG CHEST 1V PORT
1 series · 1 of 1 positions shown · non-contrast
Comparison: Chest radiograph 01/13/2016

CLINICAL DATA: Patient with history of respiratory failure. ) .

EXAM:
PORTABLE CHEST 1 VIEW

[chest ap]
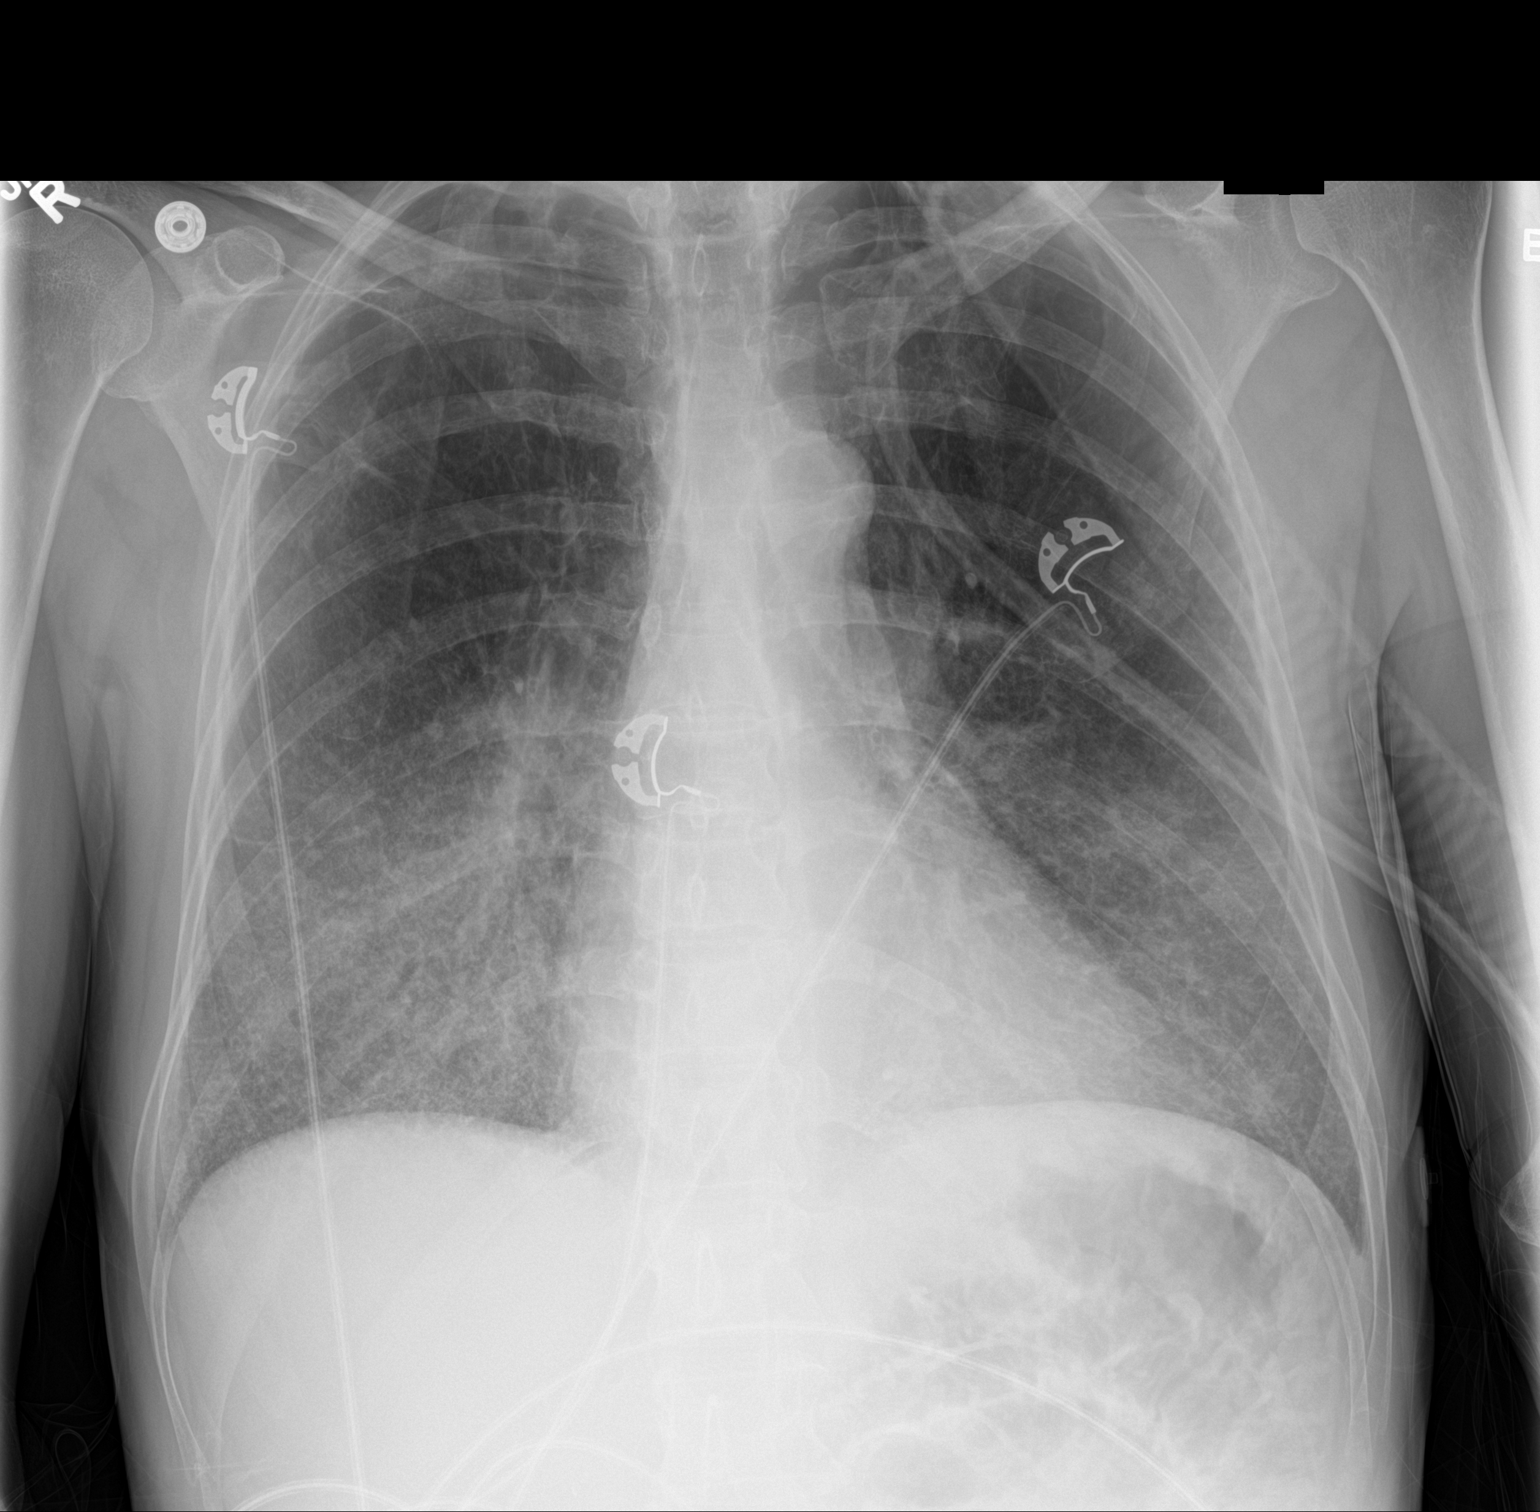

[1 of 1 positions shown; findings below may reference images not displayed]

FINDINGS: Monitoring leads overlie the patient. Stable cardiac and mediastinal
contours. Unchanged bilateral mid and lower lung heterogeneous
pulmonary opacities. Additionally within the left mid lung there is
a 1.9 cm nodule. Biapical pleural parenchymal thickening. No pleural
effusion or pneumothorax.
IMPRESSION: Grossly unchanged bilateral mid and lower lung airspace opacities
concerning for pneumonia in the appropriate clinical setting.

Additionally there is a 1.9 cm nodule within the left mid lung. This
needs further evaluation with chest CT.

These results will be called to the ordering clinician or
representative by the Radiologist Assistant, and communication
documented in the PACS or zVision Dashboard.

## 2017-05-18 IMAGING — DX DG CHEST 1V
1 series · 1 of 1 positions shown · non-contrast
Comparison: One-view chest x-ray 01/14/2016

CLINICAL DATA: Dyspnea

EXAM:
CHEST 1 VIEW

[chest ap]
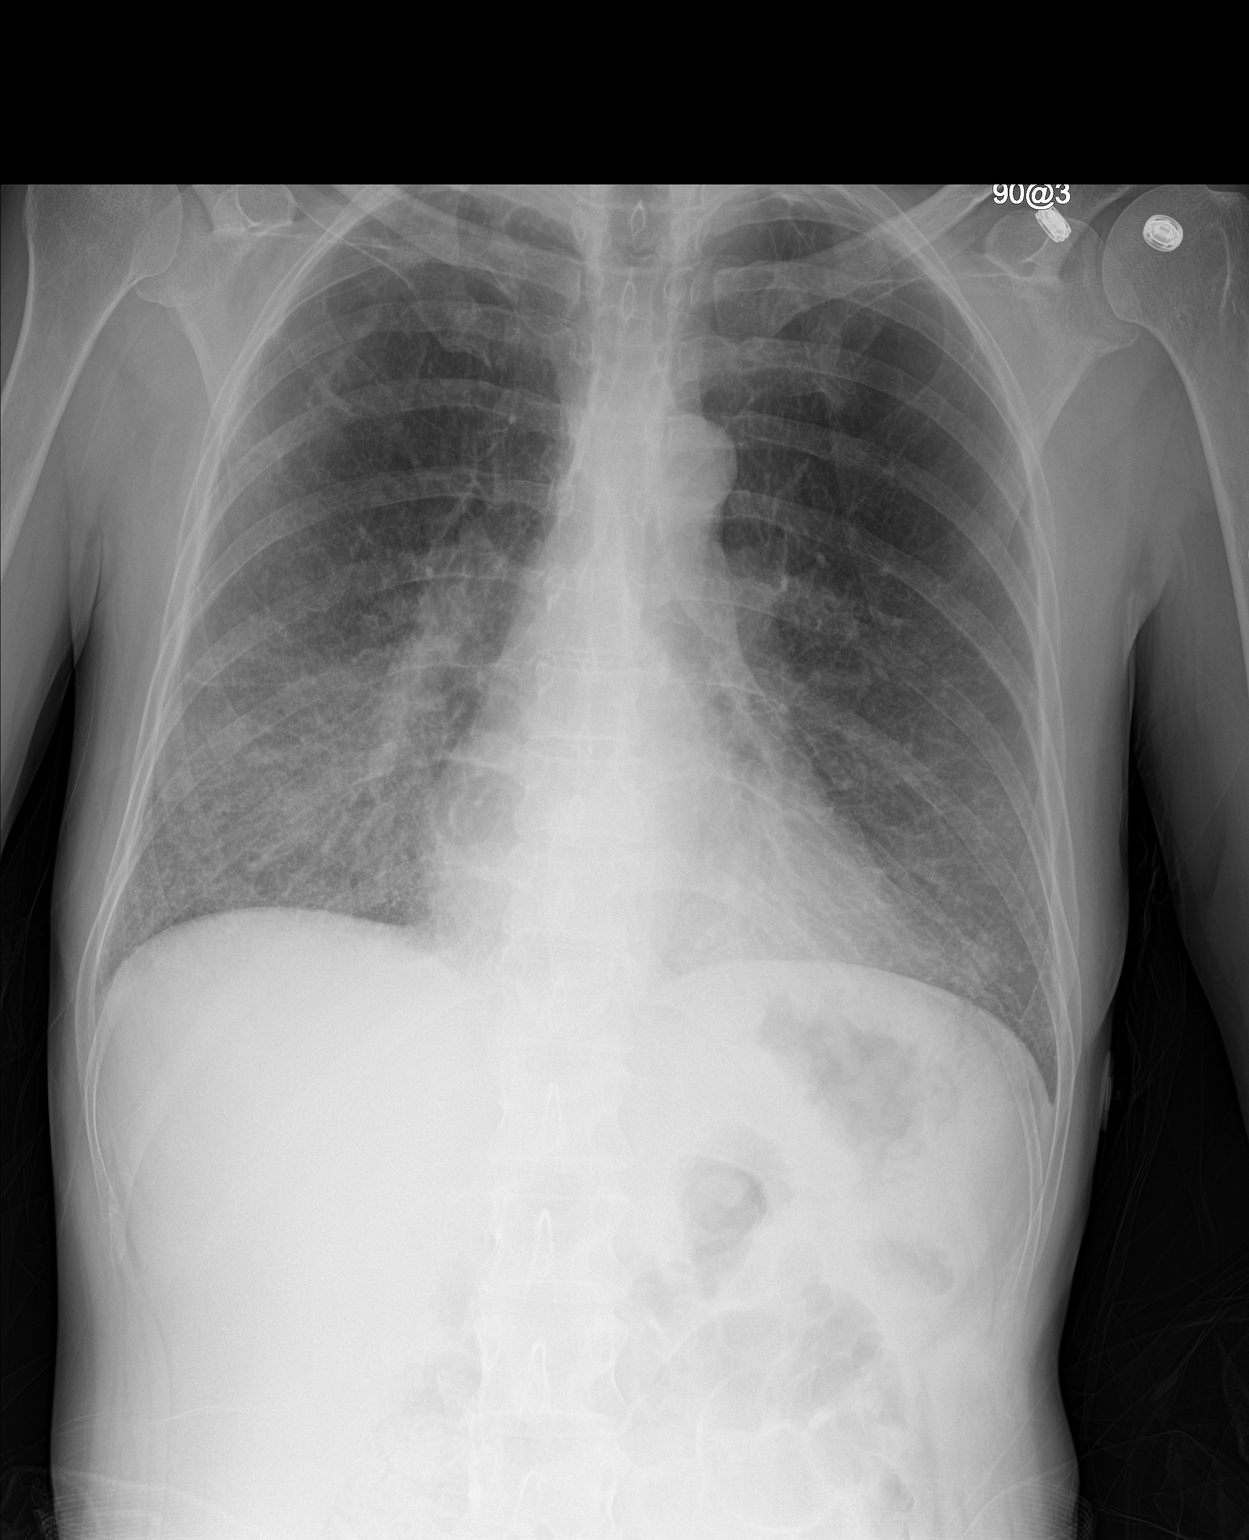

[1 of 1 positions shown; findings below may reference images not displayed]

FINDINGS: Bilateral lower lobe interstitial and airspace disease is again
seen. The heart size is normal. Previous nodular density is less
distinct on today's study. The upper lung fields remain clear. The
visualized soft tissues and bony thorax are unremarkable.
IMPRESSION: 1. Similar appearance of diffuse interstitial and airspace disease
the lung bases bilaterally compatible with pneumonia.
2. Decreased conspicuity of a nodular density in the left lung.
Recommend continued attention to this area on follow-up radiographs.

## 2017-05-24 ENCOUNTER — Other Ambulatory Visit: Payer: Self-pay | Admitting: Family Medicine

## 2017-05-24 DIAGNOSIS — J441 Chronic obstructive pulmonary disease with (acute) exacerbation: Secondary | ICD-10-CM

## 2017-06-03 ENCOUNTER — Ambulatory Visit: Payer: BLUE CROSS/BLUE SHIELD | Admitting: Family Medicine

## 2017-06-03 ENCOUNTER — Encounter: Payer: Self-pay | Admitting: Family Medicine

## 2017-06-03 VITALS — BP 97/61 | HR 88 | Temp 98.9°F | Resp 16 | Ht 61.0 in | Wt 94.8 lb

## 2017-06-03 DIAGNOSIS — J069 Acute upper respiratory infection, unspecified: Secondary | ICD-10-CM | POA: Diagnosis not present

## 2017-06-03 DIAGNOSIS — B9789 Other viral agents as the cause of diseases classified elsewhere: Secondary | ICD-10-CM | POA: Diagnosis not present

## 2017-06-03 DIAGNOSIS — R509 Fever, unspecified: Secondary | ICD-10-CM

## 2017-06-03 DIAGNOSIS — J441 Chronic obstructive pulmonary disease with (acute) exacerbation: Secondary | ICD-10-CM | POA: Diagnosis not present

## 2017-06-03 LAB — POCT INFLUENZA A/B
Influenza A, POC: NEGATIVE
Influenza B, POC: NEGATIVE

## 2017-06-03 MED ORDER — BENZONATATE 100 MG PO CAPS: 100.0000 mg | ORAL_CAPSULE | Freq: Three times a day (TID) | ORAL | 2 refills | Status: DC | PRN

## 2017-06-03 MED ORDER — PREDNISONE 50 MG PO TABS: 50.0000 mg | ORAL_TABLET | Freq: Every day | ORAL | 0 refills | Status: DC

## 2017-06-03 NOTE — Patient Instructions (Addendum)
Thank you for coming to the clinic today.   1.  Your flu test was NEGATIVE, this is not 100% though, and you can still have the flu with a negative test, otherwise it could be a different viral syndrome.  - Wash hands and cover cough very well to avoid spread of infection - For symptom control:      - Take Ibuprofen / Advil 400-600mg  every 6-8 hours as needed for fever / muscle aches, and may also take Tylenol 500-1000mg  per dose every 6-8 hours or 3 times a day, can alternate dosing      - Start Tessalon perls one every 8 hours or 3 times a day as needed for cough      - Start OTC Mucinex-DM for cough and congestion for up to 7 days - Improve hydration with plenty of clear fluids  If significant worsening with poor fluid intake, worsening fever, difficulty breathing due to coughing, worsening body aches, weakness, or other more concerning symptoms difficulty breathing you can seek treatment at Emergency Department. Also if improved flu symptoms and then worsening days to week later with concerns for bronchitis, productive cough fever chills again we may need to check for possible pneumonia that can occur after the flu  I have sent additional rx for Prednisone 50mg  daily burst over 5 days to start due to your COPD and risk of worsening breathing. We will HOLD off on antibiotics for short term, if not improved in 48 hours by Friday let me know call office and we can also add antibiotic coverage to avoid worsening.  However continue symptomatic care with Mucinex, Tessalon (refilled), and Tylenol and Albuterol as advised and symptoms should start to improve, cough will likely linger.  If worsening we could consider Chest X-ray  Please schedule a Follow-up Appointment to: Return in about 1 week (around 06/10/2017), or if symptoms worsen or fail to improve, for flu-like URI COPD.  If you have any other questions or concerns, please feel free to call the clinic or send a message through MyChart.  You may also schedule an earlier appointment if necessary.  Additionally, you may be receiving a survey about your experience at our clinic within a few days to 1 week by e-mail or mail. We value your feedback.  Saralyn PilarAlexander Chinedum Vanhouten, DO [facility]

## 2017-06-03 NOTE — Progress Notes (Signed)
Subjective:    Patient ID: Joy Ball, female    DOB: 06/30/1965, 51 y.o.   MRN: [id number]  Joy MassonMelissa S Janish is a 51 y.o. female presenting on 06/03/2017 for Cough (nasal congestion, achy, SOB, chills onset 3 days sinus pressure)  Patient presents for a same day appointment.  HPI   VIRAL URI COUGH / History of COPD / FLU LIKE ILLNESS Reports symptoms started 3 days ago with coughing and congestion, now worsening in past 24 hours, no known sick contacts but works in [employer] and contact with a lot of sick people. - Tried Tessalon Perls and Tylenol and Albuterol, some relief - last similar URI vs COPD exac was in 03/2017 improved on levaquin and prednisone - Admits subjective fevers not measured temp, shaking and chills and sweats at times - Denies wheezing and productive cough, chest pain or pressure, nausea vomiting, abdominal pain, diarrhea  Health Maintenance: Due for Flu Shot, hold today due to acute illness, will defer for up to 2 weeks  No flowsheet data found.  Social History   Tobacco Use  . Smoking status: Current Every Day Smoker    Packs/day: 1.50    Years: 30.00    Pack years: 45.00    Types: Cigarettes  . Smokeless tobacco: Current User  Substance Use Topics  . Alcohol use: Yes    Alcohol/week: 0.0 oz    Comment: occ  . Drug use: No    Review of Systems Per HPI unless specifically indicated above     Objective:    BP 97/61   Pulse 88   Temp 98.9 F (37.2 C) (Oral)   Resp 16   Ht 5\' 1"  (1.549 m)   Wt 94 lb 12.8 oz (43 kg)   SpO2 97%   BMI 17.91 kg/m   Wt Readings from Last 3 Encounters:  06/03/17 94 lb 12.8 oz (43 kg)  03/26/17 96 lb (43.5 kg)  01/28/17 93 lb 12.8 oz (42.5 kg)    Physical Exam  Constitutional: She is oriented to person, place, and time. She appears well-developed and well-nourished. No distress.  Sick appearing, slightly uncomfortable, cooperative  HENT:  Head: Normocephalic and atraumatic.  Mouth/Throat: Oropharynx is  clear and moist.  Frontal / maxillary sinuses non-tender. Nares patent with some turbinate edema without purulence. Bilateral TMs clear without erythema, effusion or bulging. Oropharynx with some slight irritation erythema generalized appearance with postnasal drainage without exudates, edema or asymmetry.  Eyes: Conjunctivae are normal. Right eye exhibits no discharge. Left eye exhibits no discharge.  Neck: Normal range of motion. Neck supple.  Cardiovascular: Normal rate, regular rhythm, normal heart sounds and intact distal pulses.  No murmur heard. Pulmonary/Chest: Effort normal. No respiratory distress. She has no wheezes. She has no rales.  Stable mildly reduced breath sounds at baseline. Speaks full sentences. Occasional cough. No focal wheezing. No focal crackles.  Musculoskeletal: She exhibits no edema.  Lymphadenopathy:    She has no cervical adenopathy.  Neurological: She is alert and oriented to person, place, and time.  Skin: Skin is warm and dry. No rash noted. She is not diaphoretic. No erythema.  Psychiatric: Her behavior is normal.  Nursing note and vitals reviewed.  Results for orders placed or performed in visit on 06/03/17  POCT Influenza A/B  Result Value Ref Range   Influenza A, POC Negative Negative   Influenza B, POC Negative Negative      Assessment & Plan:   Problem List Items Addressed This Visit  None    Visit Diagnoses    Viral URI with cough    -  Primary   Relevant Medications   predniSONE (DELTASONE) 50 MG tablet   COPD exacerbation (HCC)      Consistent with mild acute exacerbation of COPD with worsening productive cough. Similar to prior exacerbations, last 03/2017 but now seems earlier and likely triggered by URI. - No hypoxia (97% on RA), afebrile, no recent hospitalization - Continues Albuterol, Pulmicort neb  Plan: 1. Start Prednisone 50mg  x 5 day steroid burst - Start Tessalon Perls take 1 capsule up to 3 times a day as needed for cough  - refilled 2. Use albuterol q 4 hr regularly x 2-3 days. Continue maintenance inhalers/neb 3. Considered antibiotics, however afebrile and no hypoxia, would not be indicated in mild AECOPD, unless recurrent or not improved on steroids 4. RTC about 1 week if not improving, otherwise strict return criteria to go to ED - Consider add antibiotics if not improved within next 48 hours     Relevant Medications   benzonatate (TESSALON) 100 MG capsule   predniSONE (DELTASONE) 50 MG tablet   Fever and chills     Rapid Flu negative, not entirely characteristic, agree to defer Tamiflu rx, now >72 hours    Relevant Orders   POCT Influenza A/B (Completed)      Meds ordered this encounter  Medications  . benzonatate (TESSALON) 100 MG capsule    Sig: Take 1 capsule (100 mg total) by mouth 3 (three) times daily as needed for cough.    Dispense:  30 capsule    Refill:  2  . predniSONE (DELTASONE) 50 MG tablet    Sig: Take 1 tablet (50 mg total) by mouth daily with breakfast.    Dispense:  5 tablet    Refill:  0    Follow up plan: Return in about 1 week (around 06/10/2017), or if symptoms worsen or fail to improve, for flu-like URI COPD.  Saralyn PilarAlexander Caytlyn Evers, DO [facility] 06/03/2017, 6:06 PM

## 2017-06-10 ENCOUNTER — Ambulatory Visit: Payer: BLUE CROSS/BLUE SHIELD | Admitting: Family Medicine

## 2017-07-01 ENCOUNTER — Other Ambulatory Visit: Payer: Self-pay | Admitting: Family Medicine

## 2017-07-01 DIAGNOSIS — M545 Low back pain, unspecified: Secondary | ICD-10-CM

## 2017-07-02 ENCOUNTER — Other Ambulatory Visit: Payer: Self-pay | Admitting: Family Medicine

## 2017-07-28 ENCOUNTER — Other Ambulatory Visit: Payer: Self-pay | Admitting: Family Medicine

## 2017-07-28 DIAGNOSIS — M545 Low back pain, unspecified: Secondary | ICD-10-CM

## 2017-08-14 ENCOUNTER — Other Ambulatory Visit: Payer: Self-pay | Admitting: Family Medicine

## 2017-08-14 DIAGNOSIS — M545 Low back pain, unspecified: Secondary | ICD-10-CM

## 2017-09-11 ENCOUNTER — Encounter: Payer: Self-pay | Admitting: Family Medicine

## 2017-09-11 ENCOUNTER — Ambulatory Visit (INDEPENDENT_AMBULATORY_CARE_PROVIDER_SITE_OTHER): Payer: BLUE CROSS/BLUE SHIELD | Admitting: Family Medicine

## 2017-09-11 VITALS — BP 96/58 | HR 100 | Temp 98.3°F | Resp 16 | Ht 61.0 in | Wt 95.5 lb

## 2017-09-11 DIAGNOSIS — J441 Chronic obstructive pulmonary disease with (acute) exacerbation: Secondary | ICD-10-CM | POA: Diagnosis not present

## 2017-09-11 DIAGNOSIS — J111 Influenza due to unidentified influenza virus with other respiratory manifestations: Secondary | ICD-10-CM | POA: Diagnosis not present

## 2017-09-11 DIAGNOSIS — R6883 Chills (without fever): Secondary | ICD-10-CM

## 2017-09-11 LAB — POCT INFLUENZA A/B
INFLUENZA B, POC: NEGATIVE
Influenza A, POC: NEGATIVE

## 2017-09-11 MED ORDER — PREDNISONE 50 MG PO TABS: 50.0000 mg | ORAL_TABLET | Freq: Every day | ORAL | 0 refills | Status: DC

## 2017-09-11 MED ORDER — BALOXAVIR MARBOXIL(40 MG DOSE) 2 X 20 MG PO TBPK: 40.0000 mg | ORAL_TABLET | Freq: Once | ORAL | 0 refills | Status: AC

## 2017-09-11 NOTE — Patient Instructions (Addendum)
Thank you for coming to the office today.  Your flu test was NEGATIVE, this is not 100% though, and you can still have the flu with a negative test, otherwise it could be a different viral syndrome.  Concern for Flu - and COPD flare  Start with Xofluza 40mg  x 1 dose (2 of the 20mg  tabs) at once  Start Prednisone 50mg  daily x 5 days  No antibiotic at this time, we will consider this and X-ray if not improving  - Wash hands and cover cough very well to avoid spread of infection - For symptom control:      - Take Ibuprofen / Advil 400-600mg  every 6-8 hours as needed for fever / muscle aches, and may also take Tylenol 500-1000mg  per dose every 6-8 hours or 3 times a day, can alternate dosing      - Start Tessalon perls one every 8 hours or 3 times a day as needed for cough - Use albuterol inhaler 2 puffs every 4-6 hours for flare up for few days      - Start OTC Mucinex-DM for cough and congestion for up to 7 days - Improve hydration with plenty of clear fluids  If significant worsening with poor fluid intake, worsening fever, difficulty breathing due to coughing, worsening body aches, weakness, or other more concerning symptoms difficulty breathing you can seek treatment at Emergency Department. Also if improved flu symptoms and then worsening days to week later with concerns for bronchitis, productive cough fever chills again we may need to check for possible pneumonia that can occur after the flu   Please schedule a Follow-up Appointment to: Return in about 1 week (around 09/18/2017), or if symptoms worsen or fail to improve, for COPD / flu.  If you have any other questions or concerns, please feel free to call the office or send a message through MyChart. You may also schedule an earlier appointment if necessary.  Additionally, you may be receiving a survey about your experience at our office within a few days to 1 week by e-mail or mail. We value your feedback.  Saralyn PilarAlexander Sharisa Toves,  DO [facility]

## 2017-09-11 NOTE — Progress Notes (Signed)
Subjective:    Patient ID: Joy Ball, female    DOB: 07/22/1965, 52 y.o.   MRN: [id number]  Joy Ball is a 52 y.o. female presenting on 09/11/2017 for Nasal Congestion (chest congestion,HA onset week bodyache, chill 2-3 days)  Patient presents for a same day appointment.  HPI   URI / COPD Exac vs Influenza: Reports symptom started 1 week ago with sinus and headache and congestion, now acute worsening within past 2 days she had exposure to co-worker who was diagnosed with the Flu. She now has abnormal temp at times and feels warm and has shaking chills, and admits generalized body aches. Worsening productive cough and some wheezing, with occasional dyspnea. Similar to prior COPD flare, previously treated in 05/2017 for Viral URI with cough and has had COPD flare within past 2 months, usually treated with levaquin and prednisone with improvement. Using her albuterol inhaler PRN now with some relief. Denies chest pain, nausea vomiting, diarrhea abdominal pain  Health Maintenance: Did not receive Flu Vaccine this season due to ill at previous appointments.  No flowsheet data found.  Social History   Tobacco Use  . Smoking status: Current Every Day Smoker    Packs/day: 1.50    Years: 30.00    Pack years: 45.00    Types: Cigarettes  . Smokeless tobacco: Current User  Substance Use Topics  . Alcohol use: Yes    Alcohol/week: 0.0 oz    Comment: occ  . Drug use: No    Review of Systems Per HPI unless specifically indicated above     Objective:    BP (!) 96/58   Pulse 100   Temp 98.3 F (36.8 C) (Oral)   Resp 16   Ht 5\' 1"  (1.549 m)   Wt 95 lb 8 oz (43.3 kg)   SpO2 99%   BMI 18.04 kg/m   Wt Readings from Last 3 Encounters:  09/11/17 95 lb 8 oz (43.3 kg)  06/03/17 94 lb 12.8 oz (43 kg)  03/26/17 96 lb (43.5 kg)    Physical Exam  Constitutional: She is oriented to person, place, and time. She appears well-developed and well-nourished. No distress.  Ill and  tired appearing, uncomfortable with cough, cooperative  HENT:  Head: Normocephalic and atraumatic.  Mouth/Throat: Oropharynx is clear and moist.  Eyes: Conjunctivae are normal. Right eye exhibits no discharge. Left eye exhibits no discharge.  Neck: Normal range of motion. Neck supple.  Cardiovascular: Regular rhythm, normal heart sounds and intact distal pulses.  No murmur heard. Tachycardic  Pulmonary/Chest: Effort normal. No respiratory distress.  Reduced air movement diffusely, tight coarse breath sounds with some scattered wheeze, non focal, occasional coughing  Musculoskeletal: Normal range of motion. She exhibits no edema.  Lymphadenopathy:    She has no cervical adenopathy.  Neurological: She is alert and oriented to person, place, and time.  Skin: Skin is warm and dry. No rash noted. She is not diaphoretic. No erythema.  Psychiatric: Her behavior is normal.  Well groomed, good eye contact, normal speech and thoughts  Nursing note and vitals reviewed.  Results for orders placed or performed in visit on 09/11/17  POCT Influenza A/B  Result Value Ref Range   Influenza A, POC Negative Negative   Influenza B, POC Negative Negative      Assessment & Plan:   Problem List Items Addressed This Visit    None    Visit Diagnoses    COPD exacerbation (HCC)    -  Primary   Relevant Medications   predniSONE (DELTASONE) 50 MG tablet   Chill       Relevant Orders   POCT Influenza A/B (Completed)   Influenza       Relevant Medications   Baloxavir Marboxil 40 MG Dose (XOFLUZA) 20 (2) MG TBPK      Consistent with mild acute exacerbation of COPD with worsening productive cough. Similar to prior exacerbations. - No hypoxia (99% on RA), afebrile, no recent hospitalization - may be secondary to Flu  Plan: 1. Start Prednisone 50mg  x 5 day steroid burst 2. Use albuterol q 4 hr regularly x 2-3 days. Continue maintenance inhalers 3. Considered antibiotics, however afebrile and no  hypoxia, would not be indicated in mild AECOPD, unless recurrent or not improved on steroids 4. RTC about 1 week if not improving, otherwise strict return criteria to go to ED  ------------------------------  Clinically diagnosed influenza despite negative rapid flu test today, concern for flu still due to significant known exposure to positive influenza - Duration x 2 days, without complication. Tolerating PO and well hydrated - No other focal findings of infection today - Did not receive influenza vaccine this season  Plan: 1. Start Xofluza 40mg  dose x 2 of 20mg  tabs - manufacturer coupon given with copay card, review side effect benefits 2. Supportive care as advised with NSAID / Tylenol PRN fever/myalgias, improve hydration, may take OTC Cold/Flu meds 3. Start Tessalon Perls take 1 capsule up to 3 times a day as needed for cough 4. Return criteria given if significant worsening, consider post-influenza complications, otherwise follow-up if needed  If not improved will need close follow-up early next week for possible chest x-ray and antibiotics if turns out flu was not causing her symptoms.  Meds ordered this encounter  Medications  . Baloxavir Marboxil 40 MG Dose (XOFLUZA) 20 (2) MG TBPK    Sig: Take 40 mg by mouth once for 1 dose. For Flu    Dispense:  1 each    Refill:  0  . predniSONE (DELTASONE) 50 MG tablet    Sig: Take 1 tablet (50 mg total) by mouth daily with breakfast.    Dispense:  5 tablet    Refill:  0    Follow up plan: Return in about 1 week (around 09/18/2017), or if symptoms worsen or fail to improve, for COPD / flu.   Saralyn Pilar, DO [facility] 09/11/2017, 10:13 AM

## 2017-09-14 ENCOUNTER — Other Ambulatory Visit: Payer: Self-pay | Admitting: Family Medicine

## 2017-09-14 ENCOUNTER — Telehealth: Payer: Self-pay | Admitting: Family Medicine

## 2017-09-14 ENCOUNTER — Other Ambulatory Visit: Payer: Self-pay | Admitting: Nurse Practitioner

## 2017-09-14 DIAGNOSIS — J441 Chronic obstructive pulmonary disease with (acute) exacerbation: Secondary | ICD-10-CM

## 2017-09-14 DIAGNOSIS — M545 Low back pain, unspecified: Secondary | ICD-10-CM

## 2017-09-14 MED ORDER — AZITHROMYCIN 250 MG PO TABS: ORAL_TABLET | ORAL | 0 refills | Status: DC

## 2017-09-14 NOTE — Telephone Encounter (Signed)
Pt called wanted to know if you would call something in [facility] [phone]

## 2017-09-14 NOTE — Telephone Encounter (Signed)
See last note from 09/11/17, she was sent in flu medicine Xofluza and Prednisone for COPD, despite flu negative. If not improved advised to call us back early this week.  Will send Azithromycin Z-pak  Saralyn PilarAlexander Karamalegos, DO [facility] 09/14/2017, 5:27 PM

## 2017-10-03 ENCOUNTER — Other Ambulatory Visit: Payer: Self-pay | Admitting: Family Medicine

## 2017-10-03 DIAGNOSIS — M545 Low back pain, unspecified: Secondary | ICD-10-CM

## 2017-10-03 DIAGNOSIS — J441 Chronic obstructive pulmonary disease with (acute) exacerbation: Secondary | ICD-10-CM

## 2017-11-01 ENCOUNTER — Other Ambulatory Visit: Payer: Self-pay | Admitting: Family Medicine

## 2017-11-01 DIAGNOSIS — M545 Low back pain, unspecified: Secondary | ICD-10-CM

## 2017-11-01 DIAGNOSIS — J441 Chronic obstructive pulmonary disease with (acute) exacerbation: Secondary | ICD-10-CM

## 2017-11-24 ENCOUNTER — Ambulatory Visit (INDEPENDENT_AMBULATORY_CARE_PROVIDER_SITE_OTHER): Payer: BLUE CROSS/BLUE SHIELD | Admitting: Family Medicine

## 2017-11-24 ENCOUNTER — Encounter: Payer: Self-pay | Admitting: Family Medicine

## 2017-11-24 VITALS — BP 120/60 | HR 60 | Resp 14 | Ht 61.0 in | Wt 93.8 lb

## 2017-11-24 DIAGNOSIS — M546 Pain in thoracic spine: Secondary | ICD-10-CM | POA: Diagnosis not present

## 2017-11-24 MED ORDER — BACLOFEN 10 MG PO TABS: 5.0000 mg | ORAL_TABLET | Freq: Three times a day (TID) | ORAL | 3 refills | Status: DC | PRN

## 2017-11-24 MED ORDER — MELOXICAM 15 MG PO TABS: 15.0000 mg | ORAL_TABLET | Freq: Every day | ORAL | 0 refills | Status: DC

## 2017-11-24 NOTE — Patient Instructions (Addendum)
Thank you for coming to the office today.  1. For your Back Pain - I think that this is due to Muscle Spasms or strain.  2. Start with anti-inflammatory Meloxicam 15mg  once daily  With food for 2 to 4 weeks then as needed - Do NOT TAKE with ibuprofen, advil, aleve, naproxen 3. Continue Baclofen (Lioresal) 10mg  tablets - cut in half for 5mg  at night for muscle relaxant - may make you sedated or sleepy (be careful driving or working on this) if tolerated you can take every 8 hours, half or whole tab 4. May use Tylenol Extra Str 500mg  tabs - may take 1-2 tablets every 6 hours as needed 5. Recommend to start using heating pad on your lower back 1-2x daily for few weeks  This pain may take weeks to months to fully resolve, but hopefully it will respond to the medicine initially. All back injuries (small or serious) are slow to heal since we use our back muscles every day. Be careful with turning, twisting, lifting, sitting / standing for prolonged periods, and avoid re-injury.  If your symptoms significantly worsen with more pain, or new symptoms with weakness in one or both legs, new or different shooting leg pains, numbness in legs or groin, loss of control or retention of urine or bowel movements, please call back for advice and you may need to go directly to the Emergency Department.  Please schedule a Follow-up Appointment to: Return in about 6 weeks (around 01/05/2018) for Left mid back pain.  If you have any other questions or concerns, please feel free to call the office or send a message through MyChart. You may also schedule an earlier appointment if necessary.  Additionally, you may be receiving a survey about your experience at our office within a few days to 1 week by e-mail or mail. We value your feedback.  Saralyn PilarAlexander Karamalegos, DO [facility]

## 2017-11-24 NOTE — Progress Notes (Signed)
Subjective:    Patient ID: Joy Ball, female    DOB: 04-Jul-1965, 52 y.o.   MRN: [id number]  Joy Ball is a 52 y.o. female presenting on 11/24/2017 for Back Pain (started after getting out of the car this AM)  Patient presents for a same day appointment.  HPI   Mid Left BACK PAIN, Acute on Chronic - Reports symptoms started earlier today, she got out of car and stated she had possible inciting twisting injury felt twinge in L back. Now seems to be persistent with resolved improvement. - Describes pain as sharp or stabbing pain, severity moderate to severe with intermittent worsening on movements and rotation primarily to L. No pain radiating to legs or sciatica, has history of prior LBP with sciatica in past - Tried Baclofen PRN without significant relief - Taking Tylenol PRN, will try heating pad - History of lumbar OA/DJD Ask for note for work - Denies any fevers/chills, numbness, tingling, weakness, loss of control bladder/bowel incontinence or retention, unintentional wt loss, night sweats   No flowsheet data found.  Social History   Tobacco Use  . Smoking status: Current Every Day Smoker    Packs/day: 1.50    Years: 30.00    Pack years: 45.00    Types: Cigarettes  . Smokeless tobacco: Current User  Substance Use Topics  . Alcohol use: Yes    Alcohol/week: 0.0 oz    Comment: occ  . Drug use: No    Review of Systems Per HPI unless specifically indicated above     Objective:    BP 120/60 (BP Location: Right Arm, Patient Position: Sitting, Cuff Size: Normal)   Pulse 60   Resp 14   Ht 5\' 1"  (1.549 m)   Wt 93 lb 12.8 oz (42.5 kg)   BMI 17.72 kg/m   Wt Readings from Last 3 Encounters:  11/24/17 93 lb 12.8 oz (42.5 kg)  09/11/17 95 lb 8 oz (43.3 kg)  06/03/17 94 lb 12.8 oz (43 kg)    Physical Exam  Constitutional: She is oriented to person, place, and time. She appears well-developed and well-nourished. No distress.  Well-appearing, comfortable,  cooperative  HENT:  Head: Normocephalic and atraumatic.  Mouth/Throat: Oropharynx is clear and moist.  Eyes: Conjunctivae are normal. Right eye exhibits no discharge. Left eye exhibits no discharge.  Cardiovascular: Normal rate.  Pulmonary/Chest: Effort normal.  Musculoskeletal: She exhibits no edema.  Mid to Low Back Inspection: Normal appearance, very thin body habitus, no spinal deformity, symmetrical. Palpation: No tenderness spinous processes. Left mid back thoracic region paraspinal muscles tender with some hypertonicity localized. Lower lumbar spine without tender. ROM: Some provoked pain and discomfort with Left rotation, otherwise mostly intact active ROM forward flex / back extension Special Testing: Seated SLR negative for radicular pain bilaterally  Strength: Bilateral hip flex/ext 5/5, knee flex/ext 5/5, ankle dorsiflex/plantarflex 5/5 Neurovascular: intact distal sensation to light touch  Neurological: She is alert and oriented to person, place, and time.  Skin: Skin is warm and dry. No rash noted. She is not diaphoretic. No erythema.  Psychiatric: She has a normal mood and affect. Her behavior is normal.  Well groomed, good eye contact, normal speech and thoughts  Nursing note and vitals reviewed.      Assessment & Plan:   Problem List Items Addressed This Visit    None    Visit Diagnoses    Acute left-sided thoracic back pain    -  Primary   Relevant Medications  baclofen (LIORESAL) 10 MG tablet   meloxicam (MOBIC) 15 MG tablet      Acute on chronic L mid to low back pain without associated sciatica. Suspect likely due to muscle spasm/strain with minor twisting injury. known chronic LBP with DJD - No red flag symptoms. Negative SLR for radiculopathy - Inadequate conservative therapy   Plan: 1. Start rx Meloxicam 15mgSsZxBldqSeV$  daily - 2-4 week wc then PRN 2. Continue muscle relaxant with Baclofen 10mg  tabs - take 5-10mg  up to TID PRN, titrate up as tolerated 3. May  use Tylenol PRN for breakthrough 4. Encouraged use of heating pad 1-2x daily for now then PRN 5. Follow-up within 4-6 week if not improving - consider return sooner if sciatica, defer X-rays for now  Meds ordered this encounter  Medications  . baclofen (LIORESAL) 10 MG tablet    Sig: Take 0.5-1 tablets (5-10 mg total) by mouth 3 (three) times daily as needed for muscle spasms.    Dispense:  60 tablet    Refill:  3  . meloxicam (MOBIC) 15 MG tablet    Sig: Take 1 tablet (15 mg total) by mouth daily. Take with food, for 2-4 weeks only then as needed    Dispense:  30 tablet    Refill:  0    Follow up plan: Return in about 6 weeks (around 01/05/2018) for Left mid back pain.  Recommend that she contact us back in future when ready to schedule Annual Physical so that we can order appropriate labs and schedule her appropriately, she may be due for pap smear, among other screening tests.  Saralyn PilarAlexander Karamalegos, DO [facility] 11/24/2017, 11:19 PM

## 2017-11-25 ENCOUNTER — Other Ambulatory Visit: Payer: Self-pay | Admitting: Family Medicine

## 2017-11-25 DIAGNOSIS — J111 Influenza due to unidentified influenza virus with other respiratory manifestations: Secondary | ICD-10-CM

## 2017-11-25 DIAGNOSIS — J209 Acute bronchitis, unspecified: Secondary | ICD-10-CM

## 2017-11-25 DIAGNOSIS — M545 Low back pain, unspecified: Secondary | ICD-10-CM

## 2017-11-25 DIAGNOSIS — J440 Chronic obstructive pulmonary disease with acute lower respiratory infection: Secondary | ICD-10-CM

## 2017-12-02 ENCOUNTER — Other Ambulatory Visit: Payer: Self-pay | Admitting: Family Medicine

## 2017-12-02 DIAGNOSIS — J209 Acute bronchitis, unspecified: Secondary | ICD-10-CM

## 2017-12-02 DIAGNOSIS — J111 Influenza due to unidentified influenza virus with other respiratory manifestations: Secondary | ICD-10-CM

## 2017-12-02 DIAGNOSIS — J440 Chronic obstructive pulmonary disease with acute lower respiratory infection: Secondary | ICD-10-CM

## 2017-12-03 ENCOUNTER — Other Ambulatory Visit: Payer: Self-pay | Admitting: Family Medicine

## 2017-12-13 ENCOUNTER — Other Ambulatory Visit: Payer: Self-pay | Admitting: Family Medicine

## 2017-12-13 DIAGNOSIS — J209 Acute bronchitis, unspecified: Secondary | ICD-10-CM

## 2017-12-13 DIAGNOSIS — J111 Influenza due to unidentified influenza virus with other respiratory manifestations: Secondary | ICD-10-CM

## 2017-12-13 DIAGNOSIS — J440 Chronic obstructive pulmonary disease with acute lower respiratory infection: Secondary | ICD-10-CM

## 2017-12-15 ENCOUNTER — Ambulatory Visit: Payer: BLUE CROSS/BLUE SHIELD | Admitting: Family Medicine

## 2017-12-15 ENCOUNTER — Encounter: Payer: Self-pay | Admitting: Family Medicine

## 2017-12-15 VITALS — BP 112/70 | HR 94 | Temp 98.8°F | Resp 16 | Ht 61.0 in | Wt 91.0 lb

## 2017-12-15 DIAGNOSIS — J449 Chronic obstructive pulmonary disease, unspecified: Secondary | ICD-10-CM

## 2017-12-15 DIAGNOSIS — J0100 Acute maxillary sinusitis, unspecified: Secondary | ICD-10-CM | POA: Diagnosis not present

## 2017-12-15 DIAGNOSIS — R05 Cough: Secondary | ICD-10-CM | POA: Diagnosis not present

## 2017-12-15 DIAGNOSIS — H1031 Unspecified acute conjunctivitis, right eye: Secondary | ICD-10-CM

## 2017-12-15 DIAGNOSIS — B370 Candidal stomatitis: Secondary | ICD-10-CM

## 2017-12-15 DIAGNOSIS — R059 Cough, unspecified: Secondary | ICD-10-CM

## 2017-12-15 MED ORDER — MAGIC MOUTHWASH W/LIDOCAINE: 5.0000 mL | Freq: Three times a day (TID) | ORAL | 0 refills | Status: DC

## 2017-12-15 MED ORDER — FLUTICASONE PROPIONATE 50 MCG/ACT NA SUSP: 2.0000 | Freq: Every day | NASAL | 3 refills | Status: DC

## 2017-12-15 MED ORDER — BENZONATATE 100 MG PO CAPS: 100.0000 mg | ORAL_CAPSULE | Freq: Three times a day (TID) | ORAL | 2 refills | Status: DC | PRN

## 2017-12-15 MED ORDER — AZITHROMYCIN 250 MG PO TABS: ORAL_TABLET | ORAL | 0 refills | Status: DC

## 2017-12-15 NOTE — Patient Instructions (Addendum)
Thank you for coming to the office today.  1. It sounds like you had an Upper Respiratory Virus that has settled into a Bronchitis, lower respiratory tract infection. I don't have concerns for pneumonia today, and think that this should gradually improve. Once you are feeling better, the cough may take a few weeks to fully resolve. I do hear wheezing and coarse breath sounds, this may be due to the virus, also could be related to smoking.  Start Azithromycin Z pak (antibiotic) 2 tabs day 1, then 1 tab x 4 days, complete entire course even if improved  Start Tessalon Perls take 1 capsule up to 3 times a day as needed for cough  Start Atrovent nasal spray decongestant 2 sprays in each nostril up to 4 times daily - USE FOR 3 DAYS THEN AS NEEDED up to 1 week  Start nasal steroid Flonase 2 sprays in each nostril daily for 4-6 weeks, may repeat course seasonally or as needed - wait a few days then start back on this  [facility] Magic Mouthwash printed rx  - May use as needed - Use Albuterol inhaler 2 puffs every 4-6 hours around the clock for next 2-3 days, max up to 5 days then use as needed   - Use nasal saline (Simply Saline or Ocean Spray) to flush nasal congestion multiple times a day, may help cough - Drink plenty of fluids to improve congestion  If your symptoms seem to worsen instead of improve over next several days, including significant fever / chills, worsening shortness of breath, worsening wheezing, or nausea / vomiting and can't take medicines - return sooner or go to hospital Emergency Department for more immediate treatment.  If eye is worsening call and we can send antibiotic eye drop if needed  Please schedule a Follow-up Appointment to: Return in about 1 week (around 12/22/2017), or if symptoms worsen or fail to improve, for sinusitis.  If you have any other questions or concerns, please feel free to call the office or send a message through MyChart. You may also schedule an earlier  appointment if necessary.  Additionally, you may be receiving a survey about your experience at our office within a few days to 1 week by e-mail or mail. We value your feedback.  Saralyn PilarAlexander Bader Stubblefield, DO [facility]

## 2017-12-15 NOTE — Progress Notes (Signed)
Subjective:    Patient ID: Joy Ball, female    DOB: Apr 28, 1966, 52 y.o.   MRN: [id number]  Joy Ball is a 52 y.o. female presenting on 12/15/2017 for Sinusitis (chest congestion, crusty eye, ear pain, HA without fever or chills)  Patient presents for a same day appointment.  HPI   Acute Sinusitis / Conjunctivitis / History of COPD / Oral Thrush Reports symptoms started about 1 week ago with URI sinus congestion, drainage, and then some progression now to lower respiratory tract with coughing, productive cough thinner but larger amount now, less thick - Taking Claritin OTC daily - Last visit for respiratory complaints with known COPD was 08/2017 treated for influenza. She also has allergy to amoxicillin, has done well with levaquin for COPD before and Azithro in past as well. - Admits additional Right eye with some conjunctivitis having drainage worse when woke up - Admits maxillary sinus discomfort and pressure pain - Denies significant shortness of breath, wheezing, fevers or chills, sweats, rash  Depression screen PHQ 2/9 12/15/2017  Decreased Interest 0  Down, Depressed, Hopeless 0  PHQ - 2 Score 0    Social History   Tobacco Use  . Smoking status: Current Every Day Smoker    Packs/day: 1.50    Years: 30.00    Pack years: 45.00    Types: Cigarettes  . Smokeless tobacco: Current User  Substance Use Topics  . Alcohol use: Yes    Alcohol/week: 0.0 oz    Comment: occ  . Drug use: No    Review of Systems Per HPI unless specifically indicated above     Objective:    BP 112/70   Pulse 94   Temp 98.8 F (37.1 C) (Oral)   Resp 16   Ht 5\' 1"  (1.549 m)   Wt 91 lb (41.3 kg)   SpO2 100%   BMI 17.19 kg/m   Wt Readings from Last 3 Encounters:  12/15/17 91 lb (41.3 kg)  11/24/17 93 lb 12.8 oz (42.5 kg)  09/11/17 95 lb 8 oz (43.3 kg)    Physical Exam  Constitutional: She is oriented to person, place, and time. She appears well-developed and well-nourished. No  distress.  Slightly sick and tired appearing, occasional cough, cooperative  HENT:  Head: Normocephalic and atraumatic.  Mouth/Throat: Oropharynx is clear and moist.  Frontal non tender / maxillary sinuses mild tender. Nares patent with some clear congestion and deeper turbinate edema without purulence. Bilateral TMs clear without erythema, effusion or bulging. Tongue and mouth with some possible early thrush. Oropharynx non specific posterior pharyngeal drainage with mild generalized erythema, without exudates, edema or asymmetry.  Eyes: Conjunctivae and EOM are normal. Right eye exhibits discharge (clear residual). Left eye exhibits no discharge.  No significant conjunctival injection or redness. No thicker or crusty discharge.  Neck: Neck supple.  Cardiovascular: Normal rate, regular rhythm, normal heart sounds and intact distal pulses.  No murmur heard. Pulmonary/Chest: Effort normal. No respiratory distress.  Relatively stable air movement, some mild reduced but overall near baseline. Occasional coarse coughing. No focal abnormal wheezing or crackles. Speaks full sentences.  Musculoskeletal: Normal range of motion. She exhibits no edema.  Lymphadenopathy:    She has no cervical adenopathy.  Neurological: She is alert and oriented to person, place, and time.  Skin: Skin is warm and dry. No rash noted. She is not diaphoretic. No erythema.  Psychiatric: Her behavior is normal.  Well groomed, good eye contact, normal speech and thoughts  Nursing note and vitals reviewed.      Assessment & Plan:   Problem List Items Addressed This Visit    Asthma with COPD (HCC)   Relevant Medications   fluticasone (FLONASE) 50 MCG/ACT nasal spray   azithromycin (ZITHROMAX Z-PAK) 250 MG tablet   benzonatate (TESSALON) 100 MG capsule   magic mouthwash w/lidocaine SOLN    Other Visit Diagnoses    Acute non-recurrent maxillary sinusitis    -  Primary   Relevant Medications   fluticasone (FLONASE) 50  MCG/ACT nasal spray   azithromycin (ZITHROMAX Z-PAK) 250 MG tablet   benzonatate (TESSALON) 100 MG capsule   magic mouthwash w/lidocaine SOLN  Consistent with acute maxillary sinusitis, likely initially viral URI vs allergic rhinitis component with worsening concern for bacterial infection now over >1 week with other comorbid conditions with COPD at risk for future flare up if untreated.  Plan: 1. Start Azithromycin Z pak (antibiotic) 2 tabs day 1, then 1 tab x 4 days, complete entire course even if improved 2. Continue antihistamine 3. First 3 days - Start Atrovent nasal spray decongestant 2 sprays in each nostril up to 4 times daily for 7 days (use existing spray) 4. Then Start nasal steroid Flonase 2 sprays in each nostril daily for 4-6 weeks, may repeat course seasonally or as needed 5. May try OTC Mucinex (or may try Mucinex-DM for cough) up to 7-10 days then stop 6. Start Tessalon Perls take 1 capsule up to 3 times a day as needed for cough - refills added uses PRN for cough in future 7 Return criteria reviewed     Cough       Relevant Medications   benzonatate (TESSALON) 100 MG capsule   Oral thrush     Rx printed for [facility] Magic Mouthwash - developed thrush secondary to inhaler and treatment Rinse after inhaler use    Relevant Medications   azithromycin (ZITHROMAX Z-PAK) 250 MG tablet   magic mouthwash w/lidocaine SOLN   Acute conjunctivitis of right eye, unspecified acute conjunctivitis type     Clinically benign, suspect viral vs allergic Reassurance, may try anti-histamine OTC eye drop Future if worsening return criteria may consider phone in [place] antibiotic eye drops       Meds ordered this encounter  Medications  . fluticasone (FLONASE) 50 MCG/ACT nasal spray    Sig: Place 2 sprays into both nostrils daily. For 4-6 weeks then as needed or seasonally    Dispense:  16 g    Refill:  3  . azithromycin (ZITHROMAX Z-PAK) 250 MG tablet    Sig: Take 2 tabs (500mg   total) on Day 1. Take 1 tab (250mg ) daily for next 4 days.    Dispense:  6 tablet    Refill:  0  . benzonatate (TESSALON) 100 MG capsule    Sig: Take 1 capsule (100 mg total) by mouth 3 (three) times daily as needed for cough.    Dispense:  30 capsule    Refill:  2  . magic mouthwash w/lidocaine SOLN    Sig: Take 5 mLs by mouth 3 (three) times daily. 1 Part viscous lidocaine 2%  1 Part Maalox  1 Part diphenhydramine 12.5 mg per 5 ml elixir add 1 part Nystatin for Thrush Quantity: 120 ml Sig: Swish, gargle, and spit one to two teaspoonfuls every six hours as needed. Shake well before using.    Dispense:  120 mL    Refill:  0     Follow up plan:  Return in about 1 week (around 12/22/2017), or if symptoms worsen or fail to improve, for sinusitis.  Saralyn Pilar, DO [facility] 12/15/2017, 10:58 PM

## 2017-12-23 ENCOUNTER — Other Ambulatory Visit: Payer: Self-pay | Admitting: Family Medicine

## 2017-12-23 DIAGNOSIS — M546 Pain in thoracic spine: Secondary | ICD-10-CM

## 2017-12-31 ENCOUNTER — Other Ambulatory Visit: Payer: Self-pay | Admitting: Family Medicine

## 2017-12-31 DIAGNOSIS — J209 Acute bronchitis, unspecified: Secondary | ICD-10-CM

## 2017-12-31 DIAGNOSIS — J111 Influenza due to unidentified influenza virus with other respiratory manifestations: Secondary | ICD-10-CM

## 2017-12-31 DIAGNOSIS — J440 Chronic obstructive pulmonary disease with acute lower respiratory infection: Secondary | ICD-10-CM

## 2018-01-13 ENCOUNTER — Other Ambulatory Visit: Payer: Self-pay | Admitting: Family Medicine

## 2018-01-13 DIAGNOSIS — J440 Chronic obstructive pulmonary disease with acute lower respiratory infection: Secondary | ICD-10-CM

## 2018-01-13 DIAGNOSIS — J111 Influenza due to unidentified influenza virus with other respiratory manifestations: Secondary | ICD-10-CM

## 2018-01-13 DIAGNOSIS — J209 Acute bronchitis, unspecified: Secondary | ICD-10-CM

## 2018-02-17 ENCOUNTER — Other Ambulatory Visit: Payer: Self-pay | Admitting: Family Medicine

## 2018-02-17 DIAGNOSIS — M546 Pain in thoracic spine: Secondary | ICD-10-CM

## 2018-03-17 ENCOUNTER — Other Ambulatory Visit: Payer: Self-pay | Admitting: Family Medicine

## 2018-03-17 DIAGNOSIS — M546 Pain in thoracic spine: Secondary | ICD-10-CM

## 2018-04-08 ENCOUNTER — Other Ambulatory Visit: Payer: Self-pay | Admitting: Family Medicine

## 2018-04-09 ENCOUNTER — Other Ambulatory Visit: Payer: Self-pay | Admitting: Nurse Practitioner

## 2018-04-09 DIAGNOSIS — M546 Pain in thoracic spine: Secondary | ICD-10-CM

## 2018-04-14 ENCOUNTER — Other Ambulatory Visit: Payer: Self-pay | Admitting: Nurse Practitioner

## 2018-04-14 DIAGNOSIS — J449 Chronic obstructive pulmonary disease, unspecified: Secondary | ICD-10-CM

## 2018-05-18 ENCOUNTER — Other Ambulatory Visit: Payer: Self-pay | Admitting: Family Medicine

## 2018-05-18 DIAGNOSIS — M545 Low back pain, unspecified: Secondary | ICD-10-CM

## 2018-05-23 ENCOUNTER — Other Ambulatory Visit: Payer: Self-pay | Admitting: Family Medicine

## 2018-05-23 DIAGNOSIS — M546 Pain in thoracic spine: Secondary | ICD-10-CM

## 2018-05-26 ENCOUNTER — Other Ambulatory Visit: Payer: Self-pay | Admitting: Family Medicine

## 2018-06-22 ENCOUNTER — Other Ambulatory Visit: Payer: Self-pay | Admitting: Family Medicine

## 2018-06-22 DIAGNOSIS — M546 Pain in thoracic spine: Secondary | ICD-10-CM

## 2018-08-31 ENCOUNTER — Other Ambulatory Visit: Payer: Self-pay | Admitting: Nurse Practitioner

## 2018-08-31 DIAGNOSIS — M546 Pain in thoracic spine: Secondary | ICD-10-CM

## 2018-09-02 NOTE — Telephone Encounter (Signed)
Pharmacy requesting refills. Thanks!  

## 2018-09-22 ENCOUNTER — Other Ambulatory Visit: Payer: Self-pay | Admitting: Family Medicine

## 2018-09-22 DIAGNOSIS — M545 Low back pain, unspecified: Secondary | ICD-10-CM

## 2018-10-10 ENCOUNTER — Other Ambulatory Visit: Payer: Self-pay | Admitting: Family Medicine

## 2018-11-06 ENCOUNTER — Other Ambulatory Visit: Payer: Self-pay | Admitting: Family Medicine

## 2018-11-08 ENCOUNTER — Other Ambulatory Visit: Payer: Self-pay | Admitting: Family Medicine

## 2018-11-08 DIAGNOSIS — M546 Pain in thoracic spine: Secondary | ICD-10-CM

## 2018-12-08 ENCOUNTER — Other Ambulatory Visit: Payer: Self-pay | Admitting: Family Medicine

## 2018-12-08 DIAGNOSIS — M545 Low back pain, unspecified: Secondary | ICD-10-CM

## 2018-12-17 ENCOUNTER — Encounter: Payer: Self-pay | Admitting: Family Medicine

## 2018-12-17 ENCOUNTER — Telehealth: Payer: Self-pay | Admitting: Family Medicine

## 2018-12-17 NOTE — Telephone Encounter (Signed)
Called patient. She cannot wear mask due to asthma copd, limiting her breathing. Note written on mychart.  Advised her that I am concerned she is a high risk patient for COVID19 and that this does not protect her and does not protect others, but I understand it is her decision if she cannot breathe with mask on. She understands risks, and will try to take other measures to limit infection risk  Joy Putnam, DO [facility] 12/17/2018, 1:28 PM

## 2018-12-17 NOTE — Telephone Encounter (Signed)
Pt called requesting a letter for work  Not to wear a mask at work  Because of her COPD, Asthma. Pt call back # is [phone]

## 2019-01-20 ENCOUNTER — Other Ambulatory Visit: Payer: Self-pay | Admitting: Family Medicine

## 2019-02-10 ENCOUNTER — Encounter: Payer: Self-pay | Admitting: Family Medicine

## 2019-02-10 ENCOUNTER — Other Ambulatory Visit: Payer: Self-pay

## 2019-02-10 ENCOUNTER — Ambulatory Visit (INDEPENDENT_AMBULATORY_CARE_PROVIDER_SITE_OTHER): Payer: BLUE CROSS/BLUE SHIELD | Admitting: Family Medicine

## 2019-02-10 DIAGNOSIS — Z20822 Contact with and (suspected) exposure to covid-19: Secondary | ICD-10-CM

## 2019-02-10 DIAGNOSIS — R509 Fever, unspecified: Secondary | ICD-10-CM

## 2019-02-10 DIAGNOSIS — R05 Cough: Secondary | ICD-10-CM

## 2019-02-10 DIAGNOSIS — R059 Cough, unspecified: Secondary | ICD-10-CM

## 2019-02-10 DIAGNOSIS — R6889 Other general symptoms and signs: Secondary | ICD-10-CM

## 2019-02-10 NOTE — Progress Notes (Signed)
Virtual Visit via Telephone The purpose of this virtual visit is to provide medical care while limiting exposure to the novel coronavirus (COVID19) for both patient and office staff.  Consent was obtained for phone visit:  Yes.   Answered questions that patient had about telehealth interaction:  Yes.   I discussed the limitations, risks, security and privacy concerns of performing an evaluation and management service by telephone. I also discussed with the patient that there may be a patient responsible charge related to this service. The patient expressed understanding and agreed to proceed.  Patient Location: Home Provider [facility](Office)   ---------------------------------------------------------------------- Chief Complaint  Patient presents with  . Sinusitis    sore throat, cough, chills, achy, ear pain, HA, low grade fever 99 onset yesterday morning   Patient presents for a same day appointment.  S: Reviewed CMA documentation. I have called patient and gathered additional HPI as follows:  SINUSITIS / Fever and Chills / POSSIBLE CORONAVIRUS Reports that symptoms started 1 week ago with congestion but now worsened acutely in past 24 hours. With worsening sore throat, cough, low grade fever to 51F and chills body aches and ear pain, associated headache. - Tried OTC Tessalon perls, inhalers, known COPD.  Patient currently works but now did not go to work today Denies any high risk travel to areas of current concern for COVID19. Denies any known or suspected exposure to person with or possibly with COVID19.  Denies any shortness of breath, sinus pain or pressure, abdominal pain, diarrhea  -------------------------------------------------------------------------- O: No physical exam performed due to remote telephone encounter.  -------------------------------------------------------------------------- A&P:   Suspected COVID19 vs viral syndrome in  setting of high risk patient with known comorbid condition COPD and recurrent sinusitis/bronchitis. - Clinically reassuring today without dyspnea or other acute severe symptoms, by remote evaluation appears to be stable respiratory status overall  Recommend COVID19 testing to be performed ASAP, location [facility] advised patient info on test site and procedures for testing, first come first serve, order placed in Epic, no apt needed.  See quarantine recommendations below Out of work until test result, if negative will need work note to return after FEVER FREE / reduced symptoms >3 days Work note on [employer] Continue OTC and rx treatment with Tessalon and supportive care  No orders of the defined types were placed in this encounter.   REQUIRED self quarantine to AVOID POTENTIAL SPREAD - advised to avoid all exposure with others while during TESTING (Pending result) and treatment. Should continue to quarantine for up to 7-14 days - pending resolution of symptoms, if TEST IS NEGATIVE and symptoms resolve by 7 days and is afebrile >3 days - may STOP self quarantine at that time. IF test is POSITIVE then will require 10 additional day quarantine after date of positive test result.   If symptoms do not resolve or significantly improve OR if WORSENING - fever / cough - or worsening shortness of breath - then should contact us and seek advice on next steps in treatment at home vs where/when to seek care at Urgent Care or Hospital ED for further intervention and possible testing if indicated.  Patient verbalizes understanding with the above medical recommendations including the limitation of remote medical advice.  Specific follow-up / call-back criteria were given for patient to follow-up or seek medical care more urgently if needed.   - Time spent in direct consultation with patient on phone: 8 minutes  Nobie Putnam, DO [facility] 02/10/2019, 12:00 PM

## 2019-02-10 NOTE — Patient Instructions (Signed)
You may have coronavirus / [place] Testing Information  I have placed an order in the [facility] system.  All you need to do is arrive at a testing site. No appointment needed.  Hours (Open 8 a.m. - 3:45 p.m.) LAST TEST completed at 3:30pm  [place]: [facility], [address] [place], [place]: [place], [place], [place], [place] (entrance off [address])  [place]: [place]. Main [address] [place], [place] (across from [facility] Emergency Department)  Test result may take 2-7 days to result. You will be notified by MyChart or by Phone.  Phone: [phone] [facility] contact, can inquire about status of test result)  If negative test - they will call you with result. If abnormal or positive test you will be notified as well and our office will contact you to help further with treatment plan.  May take Tylenol as needed for aches pains and fever. Prefer to avoid Ibuprofen if can help it, to avoid complication from virus.  REQUIRED self quarantine to [place] - advised to avoid all exposure with others while during treatment. Should continue to quarantine for up to 7-14 days, pending resolution of symptoms, if symptoms resolve by 7 days and is afebrile >3 days - may STOP self quarantine at that time.  If symptoms do not resolve or significantly improve OR if WORSENING - fever / cough - or worsening shortness of breath - then should contact us and seek advice on next steps in treatment at home vs where/when to seek care at Urgent Care or Hospital ED for further intervention   Please schedule a Follow-up Appointment to: No follow-ups on file.  If you have any other questions or concerns, please feel free to call the office or send a message through [place]. You may also schedule an earlier appointment if necessary.  Additionally, you may be receiving a survey  about your experience at our office within a few days to 1 week by e-mail or mail. We value your feedback.  Nobie Putnam, DO [facility]

## 2019-02-11 LAB — NOVEL CORONAVIRUS, NAA: SARS-CoV-2, NAA: NOT DETECTED

## 2019-02-14 ENCOUNTER — Telehealth: Payer: Self-pay | Admitting: Family Medicine

## 2019-02-14 DIAGNOSIS — J0100 Acute maxillary sinusitis, unspecified: Secondary | ICD-10-CM

## 2019-02-14 MED ORDER — AZITHROMYCIN 250 MG PO TABS: ORAL_TABLET | ORAL | 0 refills | Status: DC

## 2019-02-14 NOTE — Telephone Encounter (Signed)
Pt called said that she tested neg.for the virus but was still sick. Please advise pt.

## 2019-02-14 NOTE — Telephone Encounter (Signed)
Called patient, she is having still sinus symptoms pressure congestion drainage, triggering cough, COVID negative. Sent Zpak now, she is fever free 2 days, I advised her fever free 3 days then can return to work if cough improved, latest Wednesday soonest can be Tuesday  Stop Mucinex DM since causing side effect  Nobie Putnam, DO [facility] 02/14/2019, 12:17 PM

## 2019-03-18 ENCOUNTER — Other Ambulatory Visit: Payer: Self-pay | Admitting: Family Medicine

## 2019-03-18 DIAGNOSIS — M546 Pain in thoracic spine: Secondary | ICD-10-CM

## 2019-06-09 ENCOUNTER — Other Ambulatory Visit: Payer: Self-pay

## 2019-06-09 ENCOUNTER — Ambulatory Visit: Payer: HRSA Program | Attending: Internal Medicine

## 2019-06-09 DIAGNOSIS — Z20828 Contact with and (suspected) exposure to other viral communicable diseases: Secondary | ICD-10-CM | POA: Diagnosis present

## 2019-06-09 DIAGNOSIS — Z20822 Contact with and (suspected) exposure to covid-19: Secondary | ICD-10-CM

## 2019-06-10 ENCOUNTER — Other Ambulatory Visit: Payer: Self-pay | Admitting: Family Medicine

## 2019-06-10 NOTE — Progress Notes (Signed)
Order(s) created erroneously. Erroneous order ID: [id number]  Order moved by: Brigitte Pulse  Order move date/time: 06/10/2019 2:31 PM  Source Patient: [id number]  Source Contact: 06/09/2019  Destination Patient: [id number]  Destination Contact: 06/09/2019

## 2019-06-10 NOTE — Progress Notes (Signed)
Orders moved to this encounter.  

## 2019-06-10 NOTE — Progress Notes (Signed)
Order(s) created erroneously. Erroneous order ID: [id number]  Order moved by: Ashonti Leandro M  Order move date/time: 06/10/2019 2:31 PM  Source Patient: [id number]  Source Contact: 06/09/2019  Destination Patient: [id number]  Destination Contact: 06/09/2019 

## 2019-06-11 LAB — NOVEL CORONAVIRUS, NAA: SARS-CoV-2, NAA: NOT DETECTED

## 2019-06-13 ENCOUNTER — Telehealth: Payer: Self-pay | Admitting: Family

## 2019-06-13 ENCOUNTER — Other Ambulatory Visit: Payer: Self-pay | Admitting: Family Medicine

## 2019-06-13 DIAGNOSIS — R0600 Dyspnea, unspecified: Secondary | ICD-10-CM

## 2019-06-13 NOTE — Telephone Encounter (Signed)
The pt was notified and she verbalize understanding, no questions or concerns.  

## 2019-06-13 NOTE — Telephone Encounter (Signed)
Pt said she tested neg for covid was having congestion, fever in the even. PT  Call back # is  [phone]

## 2019-06-13 NOTE — Telephone Encounter (Signed)
Needs virtual visit or Urgent Care.  Joy Putnam, DO [facility] 06/13/2019, 1:00 PM

## 2019-06-13 NOTE — Progress Notes (Signed)
Based on what you shared with me, I feel your condition warrants further evaluation and I recommend that you be seen for a face to face office visit.  I am concerned about you being short of breath. We may need to get an xray of your chest   NOTE: If you entered your credit card information for this eVisit, you will not be charged. You may see a "hold" on your card for the $35 but that hold will drop off and you will not have a charge processed.   If you are having a true medical emergency please call 911.      For an urgent face to face visit, [place] has five urgent care centers for your convenience:      NEW:  [facility] [place] [facility] [place] Get Driving Directions [phone] [address] [place], [place] [address] . 10 am - 6pm Monday - Friday    [place] [facility] [place] [facility]) Get Driving Directions [phone] [address] [place], [place] [address] . 10 am to 8 pm Monday-Friday . 12 pm to 8 pm [facility]  Get Driving Directions [phone] [place], [place], [place] [address] . 8 am to 8 pm Monday-Friday . 9 am to 6 pm Saturday . 11 am to 6 pm Sunday     [facility] [place] Get Driving Directions  [phone] [address] [place], [place] [address] . 8 am to 8 pm Monday-Friday . 8 am to 4 pm [facility] at [place] Get Driving Directions [phone] [address] [place], [place] [address] . 12 pm to 6 pm Monday-Friday      Your e-visit answers were reviewed by a board certified advanced clinical practitioner to complete your personal care plan.  Thank you for using e-Visits.

## 2019-06-21 ENCOUNTER — Encounter: Payer: Self-pay | Admitting: Family Medicine

## 2019-06-21 ENCOUNTER — Other Ambulatory Visit: Payer: Self-pay

## 2019-06-21 ENCOUNTER — Ambulatory Visit (INDEPENDENT_AMBULATORY_CARE_PROVIDER_SITE_OTHER): Payer: Self-pay | Admitting: Family Medicine

## 2019-06-21 DIAGNOSIS — J441 Chronic obstructive pulmonary disease with (acute) exacerbation: Secondary | ICD-10-CM

## 2019-06-21 MED ORDER — AZITHROMYCIN 250 MG PO TABS: ORAL_TABLET | ORAL | 0 refills | Status: DC

## 2019-06-21 MED ORDER — BENZONATATE 100 MG PO CAPS: ORAL_CAPSULE | ORAL | 1 refills | Status: DC

## 2019-06-21 MED ORDER — PREDNISONE 50 MG PO TABS: 50.0000 mg | ORAL_TABLET | Freq: Every day | ORAL | 0 refills | Status: DC

## 2019-06-21 NOTE — Progress Notes (Signed)
Virtual Visit via Telephone The purpose of this virtual visit is to provide medical care while limiting exposure to the novel coronavirus (COVID19) for both patient and office staff.  Consent was obtained for phone visit:  Yes.   Answered questions that patient had about telehealth interaction:  Yes.   I discussed the limitations, risks, security and privacy concerns of performing an evaluation and management service by telephone. I also discussed with the patient that there may be a patient responsible charge related to this service. The patient expressed understanding and agreed to proceed.  Patient Location: Home [facility]  ---------------------------------------------------------------------- Chief Complaint  Patient presents with  . Cough    SOB onset 2 weeks, chills and low grade fever had covid test week x-mas was negative    S: Reviewed CMA documentation. I have called patient and gathered additional HPI as follows:  COUGH / DYSPNEA / COPD EXAC Reports that symptoms started 2 weeks ago, boyfriend sick with URI, had covid test negative. She had covid test negative. similar symptoms now to prior COPD flare. Usually improve with zpak prednisone and tessalon. Admits increased cough, productive, short of breath. Out of work since 12/17 due to this and covid testing Some improved overall but here to discuss treatment  Denies any high risk travel to areas of current concern for COVID19. Denies any known or suspected exposure to person with or possibly with COVID19.  Now resolved fever Denies any  body ache, sinus pain or pressure, headache, abdominal pain, diarrhea  Past Medical History:  Diagnosis Date  . Asthma   . COPD (chronic obstructive pulmonary disease) (HCC)   . Emphysema lung (HCC)   . GERD (gastroesophageal reflux disease)   . Migraines   . Seasonal allergies    Social History   Tobacco Use  . Smoking status: Current  Every Day Smoker    Packs/day: 1.50    Years: 30.00    Pack years: 45.00    Types: Cigarettes  . Smokeless tobacco: Current User  Substance Use Topics  . Alcohol use: Yes    Alcohol/week: 0.0 standard drinks    Comment: occ  . Drug use: No    Current Outpatient Medications:  .  acetaminophen (TYLENOL) 500 MG tablet, Take 1,500 mg by mouth 3 (three) times daily. 2 to 3 times a day., Disp: , Rfl:  .  albuterol (PROVENTIL HFA;VENTOLIN HFA) 108 (90 Base) MCG/ACT inhaler, INHALE 2 PUFFS INTO THE LUNGS EVERY 6 HOURS AS NEEDED FOR WHEEZING OR SHORTNESS OF BREATH, Disp: 18 g, Rfl: 2 .  baclofen (LIORESAL) 10 MG tablet, TAKE 1/2 TO 1 TABLET(5 TO 10 MG) BY MOUTH THREE TIMES DAILY AS NEEDED FOR MUSCLE SPASMS, Disp: 60 tablet, Rfl: 2 .  benzonatate (TESSALON) 100 MG capsule, TAKE 1 CAPSULE(100 MG) BY MOUTH THREE TIMES DAILY AS NEEDED FOR COUGH, Disp: 21 capsule, Rfl: 1 .  budesonide (PULMICORT) 0.25 MG/2ML nebulizer solution, Take 2 mLs (0.25 mg total) by nebulization every 12 (twelve) hours., Disp: 60 mL, Rfl: 2 .  fluticasone (FLONASE) 50 MCG/ACT nasal spray, Place 2 sprays into both nostrils daily. For 4-6 weeks then as needed or seasonally, Disp: 16 g, Rfl: 3 .  hydrocortisone 2.5 % ointment, Apply topically 2 (two) times daily., Disp: 30 g, Rfl: 0 .  ibuprofen (ADVIL,MOTRIN) 800 MG tablet, Take 800 mg by mouth as needed., Disp: , Rfl:  .  ipratropium (ATROVENT) 0.06 % nasal spray, USE AS DIRECTED, Disp: 15 mL, Rfl:  0 .  loperamide (IMODIUM) 2 MG capsule, TAKE 1 CAPSULE BY MOUTH AS NEEDED FOR DIARRHEA OR LOOSE STOOLS, Disp: 30 capsule, Rfl: 0 .  loratadine (CLARITIN) 10 MG tablet, Take 10 mg by mouth daily., Disp: , Rfl:  .  magic mouthwash w/lidocaine SOLN, Take 5 mLs by mouth 3 (three) times daily. 1 Part viscous lidocaine 2%  1 Part Maalox  1 Part diphenhydramine 12.5 mg per 5 ml elixir add 1 part Nystatin for Thrush Quantity: 120 ml Sig: Swish, gargle, and spit one to two teaspoonfuls every six  hours as needed. Shake well before using. (Patient taking differently: Take 5 mLs by mouth as needed. 1 Part viscous lidocaine 2%  1 Part Maalox  1 Part diphenhydramine 12.5 mg per 5 ml elixir add 1 part Nystatin for Thrush Quantity: 120 ml Sig: Swish, gargle, and spit one to two teaspoonfuls every six hours as needed. Shake well before using.), Disp: 120 mL, Rfl: 0 .  meloxicam (MOBIC) 15 MG tablet, TAKE 1 TABLET BY MOUTH DAILY AS NEEDED FOR PAIN, Disp: 30 tablet, Rfl: 2 .  omeprazole (PRILOSEC) 20 MG capsule, Take 20 mg by mouth daily., Disp: , Rfl: 6 .  azithromycin (ZITHROMAX Z-PAK) 250 MG tablet, Take 2 tabs (500mg  total) on Day 1. Take 1 tab (250mg ) daily for next 4 days., Disp: 6 tablet, Rfl: 0 .  nicotine (NICODERM CQ - DOSED IN MG/24 HOURS) 14 mg/24hr patch, Apply once daily to the skin for 2 weeks after completing the 21 mg/hr patch. (Patient not taking: Reported on 06/21/2019), Disp: 14 patch, Rfl: 0 .  nicotine (NICODERM CQ - DOSED IN MG/24 HOURS) 21 mg/24hr patch, Place 1 patch (21 mg total) onto the skin daily. (Patient not taking: Reported on 06/21/2019), Disp: 28 patch, Rfl: 0 .  predniSONE (DELTASONE) 50 MG tablet, Take 1 tablet (50 mg total) by mouth daily with breakfast., Disp: 5 tablet, Rfl: 0  Depression screen [facility] 2/9 06/21/2019 02/10/2019 12/15/2017  Decreased Interest 0 0 0  Down, Depressed, Hopeless 0 0 0  PHQ - 2 Score 0 0 0    No flowsheet data found.  -------------------------------------------------------------------------- O: No physical exam performed due to remote telephone encounter.   Lab results reviewed.  Recent Results (from the past 2160 hour(s))  Novel Coronavirus, NAA ([facility])     Status: None   Collection Time: 06/09/19  9:41 AM   Specimen: Nasopharyngeal(NP) swabs in vial transport medium   NASOPHARYNGE  TESTING  Result Value Ref Range   SARS-CoV-2, NAA Not Detected Not Detected    Comment: This nucleic acid amplification test was developed and  its performance characteristics determined by 2161. Nucleic acid amplification tests include PCR and TMA. This test has not been FDA cleared or approved. This test has been authorized by FDA under an Emergency Use Authorization (EUA). This test is only authorized for the duration of time the declaration that circumstances exist justifying the authorization of the emergency use of in vitro diagnostic tests for detection of SARS-CoV-2 virus and/or diagnosis of COVID-19 infection under section 564(b)(1) of the Act, 21 U.S.C. 06/11/19) (1), unless the authorization is terminated or revoked sooner. When diagnostic testing is negative, the possibility of a false negative result should be considered in the context of a patient's recent exposures and the presence of clinical signs and symptoms consistent with COVID-19. An individual without symptoms of COVID-19 and who is not shedding SARS-CoV-2 virus would  expect to have a negative (not detected) result in  this assay.     -------------------------------------------------------------------------- A&P:  Problem List Items Addressed This Visit    None    Visit Diagnoses    COPD with acute exacerbation ([place])    -  Primary   Relevant Medications   azithromycin (ZITHROMAX Z-PAK) 250 MG tablet   predniSONE (DELTASONE) 50 MG tablet   benzonatate (TESSALON) 100 MG capsule     Consistent with Acute COPD exac, similar to prior Negative COVID19  Start Zpak 5 day dosing Start Prednisone 50mg  daily x 5 days Refill Tessalon Return to work precautions, if improved fever free 48 hour can return on Thursday 12/31  F/u if not improving sooner consider CXR  Meds ordered this encounter  Medications  . azithromycin (ZITHROMAX Z-PAK) 250 MG tablet    Sig: Take 2 tabs (500mg  total) on Day 1. Take 1 tab (250mg ) daily for next 4 days.    Dispense:  6 tablet    Refill:  0  . predniSONE (DELTASONE) 50 MG tablet    Sig: Take 1  tablet (50 mg total) by mouth daily with breakfast.    Dispense:  5 tablet    Refill:  0  . benzonatate (TESSALON) 100 MG capsule    Sig: TAKE 1 CAPSULE(100 MG) BY MOUTH THREE TIMES DAILY AS NEEDED FOR COUGH    Dispense:  21 capsule    Refill:  1   Work note written Start 06/09/19, End 06/22/19 return 12/31  Follow-up: - Return in 1 week as needed  Patient verbalizes understanding with the above medical recommendations including the limitation of remote medical advice.  Specific follow-up and call-back criteria were given for patient to follow-up or seek medical care more urgently if needed.   - Time spent in direct consultation with patient on phone: 9 minutes   Nobie Putnam, [facility] 06/21/2019, 11:34 AM

## 2019-06-21 NOTE — Patient Instructions (Signed)
Rx sent, Azithromycin zpak, prednisone, tessalon  Please schedule a Follow-up Appointment to: No follow-ups on file.  If you have any other questions or concerns, please feel free to call the office or send a message through [place]. You may also schedule an earlier appointment if necessary.  Additionally, you may be receiving a survey about your experience at our office within a few days to 1 week by e-mail or mail. We value your feedback.  Nobie Putnam, DO [place]

## 2019-06-30 ENCOUNTER — Other Ambulatory Visit: Payer: Self-pay | Admitting: Family Medicine

## 2019-06-30 DIAGNOSIS — M545 Low back pain, unspecified: Secondary | ICD-10-CM

## 2019-07-31 ENCOUNTER — Other Ambulatory Visit: Payer: Self-pay | Admitting: Family Medicine

## 2019-07-31 DIAGNOSIS — J441 Chronic obstructive pulmonary disease with (acute) exacerbation: Secondary | ICD-10-CM

## 2019-09-20 ENCOUNTER — Other Ambulatory Visit: Payer: Self-pay | Admitting: Family Medicine

## 2019-09-20 DIAGNOSIS — M546 Pain in thoracic spine: Secondary | ICD-10-CM

## 2019-10-03 ENCOUNTER — Other Ambulatory Visit: Payer: Self-pay | Admitting: Family Medicine

## 2019-10-03 DIAGNOSIS — J441 Chronic obstructive pulmonary disease with (acute) exacerbation: Secondary | ICD-10-CM

## 2019-11-02 ENCOUNTER — Other Ambulatory Visit: Payer: Self-pay | Admitting: Family Medicine

## 2019-11-02 DIAGNOSIS — M545 Low back pain, unspecified: Secondary | ICD-10-CM

## 2019-11-02 NOTE — Telephone Encounter (Signed)
Requested medication (s) are due for refill today: Yes  Requested medication (s) are on the active medication list: Yes  Last refill:  06/30/19  Future visit scheduled: No  Notes to clinic:  See request.    Requested Prescriptions  Pending Prescriptions Disp Refills   baclofen (LIORESAL) 10 MG tablet [Pharmacy Med Name: BACLOFEN 10MG  TABLETS] 60 tablet 2    Sig: TAKE 1/2 TO 1 TABLET(5 TO 10 MG) BY MOUTH THREE TIMES DAILY AS NEEDED FOR MUSCLE SPASMS      Not Delegated - Analgesics:  Muscle Relaxants Failed - 11/02/2019  7:49 AM      Failed - This refill cannot be delegated      Passed - Valid encounter within last 6 months    Recent Outpatient Visits           4 months ago COPD with acute exacerbation [facility])   [facility], DO   8 months ago Suspected Covid-19 Virus Infection   [facility], DO   1 year ago Acute non-recurrent maxillary sinusitis   [facility], DO   1 year ago Acute left-sided thoracic back pain   [facility] [place], [place], [place]   2 years ago COPD exacerbation [facility])   [facility] [place], [place], [place]

## 2019-11-21 ENCOUNTER — Other Ambulatory Visit: Payer: Self-pay | Admitting: Family Medicine

## 2019-11-21 DIAGNOSIS — J441 Chronic obstructive pulmonary disease with (acute) exacerbation: Secondary | ICD-10-CM

## 2019-12-18 ENCOUNTER — Other Ambulatory Visit: Payer: Self-pay | Admitting: Family Medicine

## 2019-12-18 DIAGNOSIS — M546 Pain in thoracic spine: Secondary | ICD-10-CM

## 2019-12-18 NOTE — Telephone Encounter (Signed)
Requested medications are due for refill today?  Yes   Requested medications are on active medication list?  Yes  Last Refill:   09/21/2019  # 30 with 2 refills   Future visit scheduled? No   Notes to Clinic:  Medication failed RX refill protocol due to no Hgb nor creatinine in the past 360 days.  Last Hgb and Creatinine were performed on 10/29/2016.

## 2020-01-26 ENCOUNTER — Telehealth: Payer: Self-pay | Admitting: Emergency Medicine

## 2020-01-26 DIAGNOSIS — J069 Acute upper respiratory infection, unspecified: Secondary | ICD-10-CM

## 2020-01-26 MED ORDER — BENZONATATE 100 MG PO CAPS: 100.0000 mg | ORAL_CAPSULE | Freq: Two times a day (BID) | ORAL | 0 refills | Status: DC | PRN

## 2020-01-26 MED ORDER — FLUTICASONE PROPIONATE 50 MCG/ACT NA SUSP: 2.0000 | Freq: Every day | NASAL | 0 refills | Status: DC

## 2020-01-26 NOTE — Progress Notes (Signed)

## 2020-04-01 ENCOUNTER — Other Ambulatory Visit: Payer: Self-pay | Admitting: Family Medicine

## 2020-04-01 DIAGNOSIS — M545 Low back pain, unspecified: Secondary | ICD-10-CM

## 2020-04-01 NOTE — Telephone Encounter (Signed)
Requested Prescriptions  Pending Prescriptions Disp Refills   baclofen (LIORESAL) 10 MG tablet [Pharmacy Med Name: BACLOFEN 10MG  TABLETS] 60 tablet     Sig: TAKE 1/2 TO 1 TABLET(5 TO 10 MG) BY MOUTH THREE TIMES DAILY AS NEEDED FOR MUSCLE SPASMS     Not Delegated - Analgesics:  Muscle Relaxants Failed - 04/01/2020  4:52 PM      Failed - This refill cannot be delegated      Failed - Valid encounter within last 6 months    Recent Outpatient Visits          9 months ago COPD with acute exacerbation (HCC)   [facility], DO   1 year ago Suspected Covid-19 Virus Infection   [facility] [place], [place], [place]   2 years ago Acute non-recurrent maxillary sinusitis   [facility] [place], [place], [place]   2 years ago Acute left-sided thoracic back pain   [facility], [place], [place]   2 years ago COPD exacerbation [facility])   [facility], DO              benzonatate (TESSALON) 100 MG capsule [Pharmacy Med Name: BENZONATATE 100MG  CAPSULES] 21 capsule 0    Sig: TAKE 1 CAPSULE(100 MG) BY MOUTH THREE TIMES DAILY AS NEEDED FOR COUGH     Ear, Nose, and Throat:  Antitussives/Expectorants Passed - 04/01/2020  4:52 PM      Passed - Valid encounter within last 12 months    Recent Outpatient Visits          9 months ago COPD with acute exacerbation [facility]   [facility], DO   1 year ago Suspected Covid-19 Virus Infection   [facility] [place], [facility], DO   2 years ago Acute non-recurrent maxillary sinusitis   [facility] [place], [facility], DO   2 years ago Acute left-sided thoracic back pain   [facility], [facility], DO   2 years ago COPD exacerbation [facility])   [facility] [place], [facility], DO

## 2020-04-01 NOTE — Telephone Encounter (Signed)
Requested medication (s) are due for refill today: yes  Requested medication (s) are on the active medication list: yes  Last refill:  11/02/19  Future visit scheduled: no  Notes to clinic:  med not delegated to NT to RF   Requested Prescriptions  Pending Prescriptions Disp Refills   baclofen (LIORESAL) 10 MG tablet [Pharmacy Med Name: BACLOFEN 10MG  TABLETS] 60 tablet     Sig: TAKE 1/2 TO 1 TABLET(5 TO 10 MG) BY MOUTH THREE TIMES DAILY AS NEEDED FOR MUSCLE SPASMS      Not Delegated - Analgesics:  Muscle Relaxants Failed - 04/01/2020  4:52 PM      Failed - This refill cannot be delegated      Failed - Valid encounter within last 6 months    Recent Outpatient Visits           9 months ago COPD with acute exacerbation [facility])   [facility], DO   1 year ago Suspected Covid-19 Virus Infection   [facility], [place], [place]   2 years ago Acute non-recurrent maxillary sinusitis   [facility] [place], [place], DO   2 years ago Acute left-sided thoracic back pain   [facility] [place], [place], DO   2 years ago COPD exacerbation [facility])   [facility], [facility], DO               Signed Prescriptions Disp Refills   benzonatate (TESSALON) 100 MG capsule 21 capsule 0    Sig: TAKE 1 CAPSULE(100 MG) BY MOUTH THREE TIMES DAILY AS NEEDED FOR COUGH      Ear, Nose, and Throat:  Antitussives/Expectorants Passed - 04/01/2020  4:52 PM      Passed - Valid encounter within last 12 months    Recent Outpatient Visits           9 months ago COPD with acute exacerbation [facility])   [facility], DO   1 year ago Suspected Covid-19 Virus Infection   [facility] [place], [place], [place]   2 years ago Acute non-recurrent maxillary sinusitis   [facility] [place], [place],  DO   2 years ago Acute left-sided thoracic back pain   [facility] [place], [place], [place]   2 years ago COPD exacerbation [facility])   [facility] [place], [place], [place]

## 2020-04-19 ENCOUNTER — Telehealth: Payer: Self-pay | Admitting: Emergency Medicine

## 2020-04-19 DIAGNOSIS — J069 Acute upper respiratory infection, unspecified: Secondary | ICD-10-CM

## 2020-04-19 MED ORDER — BENZONATATE 100 MG PO CAPS: 200.0000 mg | ORAL_CAPSULE | Freq: Three times a day (TID) | ORAL | 0 refills | Status: DC | PRN

## 2020-04-19 NOTE — Progress Notes (Signed)
Time spent: 10 min  Joy Ball,  Based on what you have shared with me, it looks like you may have a viral upper respiratory infection.  Upper respiratory infections are caused by a large number of viruses; however, rhinovirus is the most common cause. Seeing as you are not vaccinated for COVID-19, consider getting a COVID test to rule this out.   Symptoms vary from person to person, with common symptoms including sore throat, cough, fatigue or lack of energy and feeling of general discomfort.  A low-grade fever of up to 100.4 may present, but is often uncommon.  Symptoms vary however, and are closely related to a person's age or underlying illnesses.  The most common symptoms associated with an upper respiratory infection are nasal discharge or congestion, cough, sneezing, headache and pressure in the ears and face.  These symptoms usually persist for about 3 to 10 days, but can last up to 2 weeks.  It is important to know that upper respiratory infections do not cause serious illness or complications in most cases.    Upper respiratory infections can be transmitted from person to person, with the most common method of transmission being a person's hands.  The virus is able to live on the skin and can infect other persons for up to 2 hours after direct contact.  Also, these can be transmitted when someone coughs or sneezes; thus, it is important to cover the mouth to reduce this risk.  To keep the spread of the illness at bay, good hand hygiene is very important.  This is an infection that is most likely caused by a virus. There are no specific treatments other than to help you with the symptoms until the infection runs its course.  We are sorry you are not feeling well.  Here is how we plan to help!  For nasal congestion, you may use an oral decongestants such as Mucinex D or if you have glaucoma or high blood pressure use plain Mucinex.  Saline nasal spray or nasal drops can help and can safely  be used as often as needed for congestion.  For your congestion, use 1 spray of Afrin nasal spray in each nostril once daily for no more than 3 days. After 3 days of this, switch to Flonase and use daily for sinus congestion, mucus and post nasal drip. If you do not have a history of heart disease, hypertension, diabetes or thyroid disease, prostate/bladder issues or glaucoma, you may also use Sudafed to treat nasal congestion.  It is highly recommended that you consult with a pharmacist or your primary care physician to ensure this medication is safe for you to take.     If you have a cough, you may use cough suppressants such as Delsym and Robitussin.  If you have glaucoma or high blood pressure, you can also use Coricidin HBP.   For cough I have prescribed for you A prescription cough medication called Tessalon Perles 100 mg. You may take 1-2 capsules every 8 hours as needed for cough  If you have a sore or scratchy throat, use a saltwater gargle-  to  teaspoon of salt dissolved in a 4-ounce to 8-ounce glass of warm water.  Gargle the solution for approximately 15-30 seconds and then spit.  It is important not to swallow the solution.  You can also use throat lozenges/cough drops and Chloraseptic spray to help with throat pain or discomfort.  Warm or cold liquids can also be helpful in  relieving throat pain.  For headache, pain or general discomfort, you can use Ibuprofen or Tylenol as directed.   Some authorities believe that zinc sprays or the use of Echinacea may shorten the course of your symptoms.   HOME CARE . Only take medications as instructed by your medical team. . Be sure to drink plenty of fluids. Water is fine as well as fruit juices, sodas and electrolyte beverages. You may want to stay away from caffeine or alcohol. If you are nauseated, try taking small sips of liquids. How do you know if you are getting enough fluid? Your urine should be a pale yellow or almost colorless. . Get  rest. . Taking a steamy shower or using a humidifier may help nasal congestion and ease sore throat pain. You can place a towel over your head and breathe in the steam from hot water coming from a faucet. . Using a saline nasal spray works much the same way. . Cough drops, hard candies and sore throat lozenges may ease your cough. . Avoid close contacts especially the very young and the elderly . Cover your mouth if you cough or sneeze . Always remember to wash your hands.   GET HELP RIGHT AWAY IF: . You develop worsening fever. . If your symptoms do not improve within 10 days . You develop yellow or green discharge from your nose over 3 days. . You have coughing fits . You develop a severe head ache or visual changes. . You develop shortness of breath, difficulty breathing or start having chest pain . Your symptoms persist after you have completed your treatment plan  MAKE SURE YOU   Understand these instructions.  Will watch your condition.  Will get help right away if you are not doing well or get worse.  Your e-visit answers were reviewed by a board certified advanced clinical practitioner to complete your personal care plan. Depending upon the condition, your plan could have included both over the counter or prescription medications. Please review your pharmacy choice. If there is a problem, you may call our nursing hot line at and have the prescription routed to another pharmacy. Your safety is important to [place]. If you have drug allergies check your prescription carefully.   You can use MyChart to ask questions about today's visit, request a non-urgent call back, or ask for a work or school excuse for 24 hours related to this e-Visit. If it has been greater than 24 hours you will need to follow up with your provider, or enter a new e-Visit to address those concerns. You will get an e-mail in the next two days asking about your experience.  I hope that your e-visit has been valuable  and will speed your recovery. Thank you for using e-visits.

## 2020-05-14 ENCOUNTER — Telehealth: Payer: Self-pay | Admitting: Family

## 2020-05-14 DIAGNOSIS — K0889 Other specified disorders of teeth and supporting structures: Secondary | ICD-10-CM

## 2020-05-14 MED ORDER — CLINDAMYCIN HCL 150 MG PO CAPS: 150.0000 mg | ORAL_CAPSULE | Freq: Three times a day (TID) | ORAL | 0 refills | Status: DC

## 2020-05-14 NOTE — Progress Notes (Signed)
E-Visit for Dental Pain  We are sorry that you are not feeling well.  Here is how we plan to help!  Based on what you have shared with me in the questionnaire, it sounds like you have Dental abscess.  Clindamycin 150 mg Three times a day x 10 days  It is imperative that you see a dentist within 10 days of this eVisit to determine the cause of the dental pain and be sure it is adequately treated  A toothache or tooth pain is caused when the nerve in the root of a tooth or surrounding a tooth is irritated. Dental (tooth) infection, decay, injury, or loss of a tooth are the most common causes of dental pain. Pain may also occur after an extraction (tooth is pulled out). Pain sometimes originates from other areas and radiates to the jaw, thus appearing to be tooth pain.Bacteria growing inside your mouth can contribute to gum disease and dental decay, both of which can cause pain. A toothache occurs from inflammation of the central portion of the tooth called pulp. The pulp contains nerve endings that are very sensitive to pain. Inflammation to the pulp or pulpitis may be caused by dental cavities, trauma, and infection.    HOME CARE:   For toothaches:  Over-the-counter pain medications such as acetaminophen or ibuprofen may be used. Take these as directed on the package while you arrange for a dental appointment.  Avoid very cold or hot foods, because they may make the pain worse.  You may get relief from biting on a cotton ball soaked in oil of cloves. You can get oil of cloves at most drug stores.  For jaw pain:   Aspirin may be helpful for problems in the joint of the jaw in adults.  If pain happens every time you open your mouth widely, the temporomandibular joint (TMJ) may be the source of the pain. Yawning or taking a large bite of food may worsen the pain. An appointment with your doctor or dentist will help you find the cause.     GET HELP RIGHT AWAY IF:   You have a high fever  or chills  If you have had a recent head or face injury and develop headache, light headedness, nausea, vomiting, or other symptoms that concern you after an injury to your face or mouth, you could have a more serious injury in addition to your dental injury.  A facial rash associated with a toothache: This condition may improve with medication. Contact your doctor for them to decide what is appropriate.  Any jaw pain occurring with chest pain: Although jaw pain is most commonly caused by dental disease, it is sometimes referred pain from other areas. People with heart disease, especially people who have had stents placed, people with diabetes, or those who have had heart surgery may have jaw pain as a symptom of heart attack or angina. If your jaw or tooth pain is associated with lightheadedness, sweating, or shortness of breath, you should see a doctor as soon as possible.  Trouble swallowing or excessive pain or bleeding from gums: If you have a history of a weakened immune system, diabetes, or steroid use, you may be more susceptible to infections. Infections can often be more severe and extensive or caused by unusual organisms. Dental and gum infections in people with these conditions may require more aggressive treatment. An abscess may need draining or IV antibiotics, for example.  MAKE SURE YOU    Understand these instructions.  Will watch your condition.  Will get help right away if you are not doing well or get worse.  Thank you for choosing an e-visit. Your e-visit answers were reviewed by a board certified advanced clinical practitioner to complete your personal care plan. Depending upon the condition, your plan could have included both over the counter or prescription medications. Please review your pharmacy choice. Make sure the pharmacy is open so you can pick up prescription now. If there is a problem, you may contact your provider through [employer] and have the  prescription routed to another pharmacy. Your safety is important to [place]. If you have drug allergies check your prescription carefully.  For the next 24 hours you can use MyChart to ask questions about today's visit, request a non-urgent call back, or ask for a work or school excuse. You will get an email in the next two days asking about your experience. I hope that your e-visit has been valuable and will speed your recovery.  Greater than 5 minutes, yet less than 10 minutes of time have been spent researching, coordinating, and implementing care for this patient today.  Thank you for the details you included in the comment boxes. Those details are very helpful in determining the best course of treatment for you and help [place] to provide the best care.

## 2020-05-23 ENCOUNTER — Telehealth: Payer: Self-pay | Admitting: Family

## 2020-05-23 DIAGNOSIS — H9203 Otalgia, bilateral: Secondary | ICD-10-CM

## 2020-05-23 NOTE — Progress Notes (Signed)
Based on what you shared with me, I feel your condition warrants further evaluation and I recommend that you be seen for a face to face office visit.  Hello, After reviewing your chart, it look like you have had several Evisits over the last month for similar symptoms. You need to be seen face to face.    NOTE: If you entered your credit card information for this eVisit, you will not be charged. You may see a "hold" on your card for the $35 but that hold will drop off and you will not have a charge processed.   If you are having a true medical emergency please call 911.      For an urgent face to face visit, [facility] has five urgent care centers for your convenience:     [facility] Urgent Care Center at [facility] Directions [phone] [address] [place], [place] [address] . 10 am - 6pm Monday - Friday    [facility] Urgent Care Center [facility]) Get Driving Directions [phone] [address] [place], [place] [address] . 10 am to 8 pm Monday-Friday . 12 pm to 8 pm [facility] Urgent Care at [facility] Get Driving Directions [phone] [address] [place], [place] [address] . 8 am to 8 pm Monday-Friday . 9 am to 6 pm Saturday . 11 am to 6 pm Sunday     [facility] Health Urgent Care at [facility] Get Driving Directions  [phone] [address] [place], [place] [address] . 8 am to 8 pm Monday-Friday . 8 am to 4 pm [facility] Urgent Care at [facility] Directions [phone] [address] [place], [place] [address] . 12 pm to 6 pm Monday-Friday      Your e-visit answers were reviewed by a board certified advanced clinical practitioner to complete your personal care plan.  Thank you for using e-Visits.

## 2020-06-19 ENCOUNTER — Other Ambulatory Visit: Payer: Self-pay | Admitting: Family Medicine

## 2020-06-19 NOTE — Telephone Encounter (Signed)
Requested medication (s) are due for refill today: yes  Requested medication (s) are on the active medication list: yes  Last refill:  04/19/20 #20 0 refills  Future visit scheduled: no  Notes to clinic:  no valid encounters with in 12 months, called patient  , no answer, left message to call clinic back to review Rx request and schedule appt. Do you want to renew Rx?    Requested Prescriptions  Pending Prescriptions Disp Refills   benzonatate (TESSALON) 100 MG capsule [Pharmacy Med Name: BENZONATATE 100MG  CAPSULES] 21 capsule     Sig: TAKE 1 CAPSULE(100 MG) BY MOUTH THREE TIMES DAILY AS NEEDED FOR COUGH      Ear, Nose, and Throat:  Antitussives/Expectorants Failed - 06/19/2020 12:22 PM      Failed - Valid encounter within last 12 months    Recent Outpatient Visits           12 months ago COPD with acute exacerbation [facility])   [facility], DO   1 year ago Suspected Covid-19 Virus Infection   [facility] [place], [place], [place]   2 years ago Acute non-recurrent maxillary sinusitis   [facility] [place], [place], [place]   2 years ago Acute left-sided thoracic back pain   [facility] [place], [place], [place]   2 years ago COPD exacerbation [facility])   [facility] [place], [place], [place]

## 2020-07-11 ENCOUNTER — Other Ambulatory Visit: Payer: Self-pay | Admitting: Family Medicine

## 2020-07-11 DIAGNOSIS — M545 Low back pain, unspecified: Secondary | ICD-10-CM

## 2020-07-11 NOTE — Telephone Encounter (Signed)
Non delegated refill for Baclofen

## 2020-07-13 ENCOUNTER — Other Ambulatory Visit: Payer: Self-pay

## 2020-07-13 DIAGNOSIS — M545 Low back pain, unspecified: Secondary | ICD-10-CM

## 2020-07-18 ENCOUNTER — Encounter: Payer: Self-pay | Admitting: Family Medicine

## 2020-07-18 ENCOUNTER — Telehealth (INDEPENDENT_AMBULATORY_CARE_PROVIDER_SITE_OTHER): Payer: Self-pay | Admitting: Family Medicine

## 2020-07-18 ENCOUNTER — Other Ambulatory Visit: Payer: Self-pay

## 2020-07-18 VITALS — Ht 61.0 in | Wt 98.0 lb

## 2020-07-18 DIAGNOSIS — J441 Chronic obstructive pulmonary disease with (acute) exacerbation: Secondary | ICD-10-CM

## 2020-07-18 DIAGNOSIS — M545 Low back pain, unspecified: Secondary | ICD-10-CM

## 2020-07-18 MED ORDER — BENZONATATE 200 MG PO CAPS: 200.0000 mg | ORAL_CAPSULE | Freq: Three times a day (TID) | ORAL | 3 refills | Status: DC | PRN

## 2020-07-18 MED ORDER — BACLOFEN 10 MG PO TABS: 5.0000 mg | ORAL_TABLET | Freq: Three times a day (TID) | ORAL | 3 refills | Status: DC | PRN

## 2020-07-18 MED ORDER — FLOVENT HFA 220 MCG/ACT IN AERO: 1.0000 | INHALATION_SPRAY | Freq: Two times a day (BID) | RESPIRATORY_TRACT | 12 refills | Status: DC

## 2020-07-18 MED ORDER — PREDNISONE 50 MG PO TABS: 50.0000 mg | ORAL_TABLET | Freq: Every day | ORAL | 0 refills | Status: DC

## 2020-07-18 MED ORDER — AZITHROMYCIN 250 MG PO TABS: ORAL_TABLET | ORAL | 0 refills | Status: DC

## 2020-07-18 NOTE — Patient Instructions (Signed)
Cannot rule out covid, unvaccinated patient. Will need covid testing as recommended, [facility] # given to schedule  Plan: 1. Start Prednisone 50mg  x 5 day steroid burst - Start Azithromycin Z pak (antibiotic) 2 tabs day 1, then 1 tab x 4 days, complete entire course even if improved 2. Use albuterol q 4 hr regularly x 2-3 days - use neb if needed - Re order FLovent BID maintenance  Start Tessalon Perls take 1 capsule up to 3 times a day as needed for cough  RTC about 1 week if not improving, otherwise strict return criteria to go to ED  Please schedule a Follow-up Appointment to: No follow-ups on file.  If you have any other questions or concerns, please feel free to call the office or send a message through MyChart. You may also schedule an earlier appointment if necessary.  Additionally, you may be receiving a survey about your experience at our office within a few days to 1 week by e-mail or mail. We value your feedback.  , DO [facility], [facility]

## 2020-07-18 NOTE — Progress Notes (Signed)
Virtual Visit via Telephone The purpose of this virtual visit is to provide medical care while limiting exposure to the novel coronavirus (COVID19) for both patient and office staff.  Consent was obtained for phone visit:  Yes.   Answered questions that patient had about telehealth interaction:  Yes.   I discussed the limitations, risks, security and privacy concerns of performing an evaluation and management service by telephone. I also discussed with the patient that there may be a patient responsible charge related to this service. The patient expressed understanding and agreed to proceed.  Patient Location: Home Provider Location: [facility] (Office)  Participants in virtual visit: - Patient: Kihanna Kamiya. Oh - CMA: Burnell Blanks, CMA - Provider: Dr Althea Charon  She did not have insurance coverage for period of time, due to name issue on prior card.  ---------------------------------------------------------------------- Chief Complaint  Patient presents with  . Nasal Congestion  . chest congestion    S: Reviewed CMA documentation. I have called patient and gathered additional HPI as follows:  Chest / Nasal Congestion COPD Exacerbation Reports that symptoms started 4-5 days ago, with productive cough and dyspnea. She has started using Albuterol Nebulizer PRN with some relief. No recent COVID exposure. Albuterol inhaler PRN. She has used Tessalon 200mg  in past. Prior COPD exacerbation similar with results on Prednisone, Zpak before. She request re order Flovent. No longer using budesonide neb treatment.  Denies any high risk travel to areas of current concern for COVID19. Denies any known or suspected exposure to person with or possibly with COVID19.  Denies any fevers chills sweats body ache, headache, abdominal pain, diarrhea  Past Medical History:  Diagnosis Date  . Asthma   . COPD (chronic obstructive pulmonary disease) (HCC)   . Emphysema lung (HCC)    . GERD (gastroesophageal reflux disease)   . Migraines   . Seasonal allergies    Social History   Tobacco Use  . Smoking status: Current Every Day Smoker    Packs/day: 1.50    Years: 30.00    Pack years: 45.00    Types: Cigarettes  . Smokeless tobacco: Current User  Substance Use Topics  . Alcohol use: Yes    Alcohol/week: 0.0 standard drinks    Comment: occ  . Drug use: No    Current Outpatient Medications:  .  acetaminophen (TYLENOL) 500 MG tablet, Take 1,500 mg by mouth 3 (three) times daily. 2 to 3 times a day., Disp: , Rfl:  .  albuterol (PROVENTIL HFA;VENTOLIN HFA) 108 (90 Base) MCG/ACT inhaler, INHALE 2 PUFFS INTO THE LUNGS EVERY 6 HOURS AS NEEDED FOR WHEEZING OR SHORTNESS OF BREATH, Disp: 18 g, Rfl: 2 .  azithromycin (ZITHROMAX Z-PAK) 250 MG tablet, Take 2 tabs (500mg  total) on Day 1. Take 1 tab (250mg ) daily for next 4 days., Disp: 6 tablet, Rfl: 0 .  benzonatate (TESSALON) 200 MG capsule, Take 1 capsule (200 mg total) by mouth 3 (three) times daily as needed for cough., Disp: 60 capsule, Rfl: 3 .  budesonide (PULMICORT) 0.25 MG/2ML nebulizer solution, Take 2 mLs (0.25 mg total) by nebulization every 12 (twelve) hours., Disp: 60 mL, Rfl: 2 .  fluticasone (FLONASE) 50 MCG/ACT nasal spray, Place 2 sprays into both nostrils daily., Disp: 9.9 mL, Rfl: 0 .  fluticasone (FLOVENT HFA) 220 MCG/ACT inhaler, Inhale 1 puff into the lungs in the morning and at bedtime., Disp: 1 each, Rfl: 12 .  hydrocortisone 2.5 % ointment, Apply topically 2 (two) times daily., Disp: 30  g, Rfl: 0 .  ibuprofen (ADVIL,MOTRIN) 800 MG tablet, Take 800 mg by mouth as needed., Disp: , Rfl:  .  loperamide (IMODIUM) 2 MG capsule, TAKE 1 CAPSULE BY MOUTH AS NEEDED FOR DIARRHEA OR LOOSE STOOLS, Disp: 30 capsule, Rfl: 0 .  loratadine (CLARITIN) 10 MG tablet, Take 10 mg by mouth daily., Disp: , Rfl:  .  meloxicam (MOBIC) 15 MG tablet, TAKE 1 TABLET BY MOUTH DAILY AS NEEDED FOR PAIN, Disp: 30 tablet, Rfl: 2 .   nicotine (NICODERM CQ - DOSED IN MG/24 HOURS) 14 mg/24hr patch, Apply once daily to the skin for 2 weeks after completing the 21 mg/hr patch., Disp: 14 patch, Rfl: 0 .  nicotine (NICODERM CQ - DOSED IN MG/24 HOURS) 21 mg/24hr patch, Place 1 patch (21 mg total) onto the skin daily., Disp: 28 patch, Rfl: 0 .  omeprazole (PRILOSEC) 20 MG capsule, Take 20 mg by mouth daily., Disp: , Rfl: 6 .  predniSONE (DELTASONE) 50 MG tablet, Take 1 tablet (50 mg total) by mouth daily with breakfast., Disp: 5 tablet, Rfl: 0 .  baclofen (LIORESAL) 10 MG tablet, Take 0.5-1 tablets (5-10 mg total) by mouth 3 (three) times daily as needed for muscle spasms., Disp: 90 tablet, Rfl: 3  Depression screen [facility] 2/9 06/21/2019 02/10/2019 12/15/2017  Decreased Interest 0 0 0  Down, Depressed, Hopeless 0 0 0  PHQ - 2 Score 0 0 0    No flowsheet data found.  -------------------------------------------------------------------------- O: No physical exam performed due to remote telephone encounter.  Lab results reviewed.  No results found for this or any previous visit (from the past 2160 hour(s)).  -------------------------------------------------------------------------- A&P:  Problem List Items Addressed This Visit   None   Visit Diagnoses    COPD with acute exacerbation (HCC)    -  Primary   Relevant Medications   predniSONE (DELTASONE) 50 MG tablet   azithromycin (ZITHROMAX Z-PAK) 250 MG tablet   fluticasone (FLOVENT HFA) 220 MCG/ACT inhaler   benzonatate (TESSALON) 200 MG capsule   Acute left-sided low back pain without sciatica       Relevant Medications   predniSONE (DELTASONE) 50 MG tablet   baclofen (LIORESAL) 10 MG tablet     Consistent with mild acute exacerbation of COPD with worsening productive cough. Similar to prior exacerbations - Afebrile Cannot rule out covid, unvaccinated patient. Will need covid testing as recommended, [facility] # given to schedule  Plan: 1. Start Prednisone 50mg  x 5  day steroid burst - Start Azithromycin Z pak (antibiotic) 2 tabs day 1, then 1 tab x 4 days, complete entire course even if improved 2. Use albuterol q 4 hr regularly x 2-3 days - use neb if needed - Re order FLovent BID maintenance  Start Tessalon Perls take 1 capsule up to 3 times a day as needed for cough  RTC about 1 week if not improving, otherwise strict return criteria to go to ED   Meds ordered this encounter  Medications  . predniSONE (DELTASONE) 50 MG tablet    Sig: Take 1 tablet (50 mg total) by mouth daily with breakfast.    Dispense:  5 tablet    Refill:  0  . azithromycin (ZITHROMAX Z-PAK) 250 MG tablet    Sig: Take 2 tabs (500mg  total) on Day 1. Take 1 tab (250mg ) daily for next 4 days.    Dispense:  6 tablet    Refill:  0  . fluticasone (FLOVENT HFA) 220 MCG/ACT inhaler  Sig: Inhale 1 puff into the lungs in the morning and at bedtime.    Dispense:  1 each    Refill:  12  . benzonatate (TESSALON) 200 MG capsule    Sig: Take 1 capsule (200 mg total) by mouth 3 (three) times daily as needed for cough.    Dispense:  60 capsule    Refill:  3  . baclofen (LIORESAL) 10 MG tablet    Sig: Take 0.5-1 tablets (5-10 mg total) by mouth 3 (three) times daily as needed for muscle spasms.    Dispense:  90 tablet    Refill:  3    Follow-up: PRN if not improved  NOTE - Patient was advised will need to return for routine wellness / follow-up visit when feeling better, perhaps Spring or Summer 2022 when she gets insurance info corrected, has not done wellness/prevention visit and is due for routine eval and screening and labs. Recommend to keep her on refills and keep as patient will need to arrange this apt.  Patient verbalizes understanding with the above medical recommendations including the limitation of remote medical advice.  Specific follow-up and call-back criteria were given for patient to follow-up or seek medical care more urgently if needed.   - Time spent in  direct consultation with patient on phone: 11 minutes   Saralyn Pilar, DO [facility] 07/18/2020, 10:02 AM

## 2020-07-19 ENCOUNTER — Other Ambulatory Visit: Payer: Self-pay

## 2020-07-19 DIAGNOSIS — Z20822 Contact with and (suspected) exposure to covid-19: Secondary | ICD-10-CM

## 2020-07-20 LAB — NOVEL CORONAVIRUS, NAA: SARS-CoV-2, NAA: NOT DETECTED

## 2020-07-20 LAB — SARS-COV-2, NAA 2 DAY TAT

## 2021-01-23 ENCOUNTER — Telehealth: Payer: Self-pay | Admitting: Nurse Practitioner

## 2021-01-23 ENCOUNTER — Encounter: Payer: Self-pay | Admitting: Nurse Practitioner

## 2021-01-23 DIAGNOSIS — U071 COVID-19: Secondary | ICD-10-CM

## 2021-01-23 MED ORDER — MOLNUPIRAVIR EUA 200MG CAPSULE: 4.0000 | ORAL_CAPSULE | Freq: Two times a day (BID) | ORAL | 0 refills | Status: AC

## 2021-01-23 NOTE — Patient Instructions (Signed)
You are being prescribed MOLNUPIRAVIR for COVID-19 infection.    Please pick up your prescription at: [facility] pharmacy called into [place]    Please call the pharmacy or go through the drive through vs going inside if you are picking up the mediation yourself to prevent further spread. If prescribed to a [facility] affiliated pharmacy, a pharmacist will bring the medication out to your car.   ADMINISTRATION INSTRUCTIONS: Take with or without food. Swallow the tablets whole. Don't chew, crush, or break the medications because it might not work as well  For each dose of the medication, you should be taking FOUR tablets at one time, TWICE a day   Finish your full five-day course of Molnupiravir even if you feel better before you're done. Stopping this medication too early can make it less effective to prevent severe illness related to COVID19.    Molnupiravir is prescribed for YOU ONLY. Don't share it with others, even if they have similar symptoms as you. This medication might not be right for everyone.   Make sure to take steps to protect yourself and others while you're taking this medication in order to get well soon and to prevent others from getting sick with COVID-19.   **If you are of childbearing potential (any gender) - it is advised to not get pregnant while taking this medication and recommended that condoms are used for female partners the next 3 months after taking the medication out of extreme caution    COMMON SIDE EFFECTS: Diarrhea Nausea  Dizziness    If your COVID-19 symptoms get worse, get medical help right away. Call 911 if you experience symptoms such as worsening cough, trouble breathing, chest pain that doesn't go away, confusion, a hard time staying awake, and pale or blue-colored skin. This medication won't prevent all COVID-19 cases from getting worse.

## 2021-01-23 NOTE — Progress Notes (Signed)
You have tested positive for covid. Due to your age and history of asthmas, you would benefit from one of the new antivirals. I cannot prescribe that in an evisit. You will need to do a vitrtual video visit in order to get antiviral treatment. Plesae go to your my chart and to virtual urgent video visit. Mary-Margaret Daphine Deutscher, FNP

## 2021-01-23 NOTE — Progress Notes (Signed)
Virtual Visit Consent   Chyrel Masson, you are scheduled for a virtual visit with Mary-Margaret Daphine Deutscher, FNP, a [facility] provider, today.     Just as with appointments in the office, your consent must be obtained to participate.  Your consent will be active for this visit and any virtual visit you may have with one of our providers in the next 365 days.     If you have a MyChart account, a copy of this consent can be sent to you electronically.  All virtual visits are billed to your insurance company just like a traditional visit in the office.    As this is a virtual visit, video technology does not allow for your provider to perform a traditional examination.  This may limit your provider's ability to fully assess your condition.  If your provider identifies any concerns that need to be evaluated in person or the need to arrange testing (such as labs, EKG, etc.), we will make arrangements to do so.     Although advances in technology are sophisticated, we cannot ensure that it will always work on either your end or our end.  If the connection with a video visit is poor, the visit may have to be switched to a telephone visit.  With either a video or telephone visit, we are not always able to ensure that we have a secure connection.     I need to obtain your verbal consent now.   Are you willing to proceed with your visit today? YES   Dawnn S Ravert has provided verbal consent on 01/23/2021 for a virtual visit (video or telephone).   Mary-Margaret Daphine Deutscher, FNP   Date: 01/23/2021 7:57 AM   Virtual Visit via Video Note   I, Mary-Margaret Natausha Jungwirth, connected with Annarae Macnair Mcadoo ([id number], 08-08-1965) on 01/23/21 at  8:00 AM EDT by a video-enabled telemedicine application and verified that I am speaking with the correct person using two identifiers.  Location: Patient: Virtual Visit Location Patient: Home Provider: Virtual Visit Location Provider: Mobile   I discussed the limitations of  evaluation and management by telemedicine and the availability of in person appointments. The patient expressed understanding and agreed to proceed.    History of Present Illness: BARBRA MINER is a 55 y.o. who identifies as a female who was assigned female at birth, and is being seen today for covid positive.  HPI: Patient tested positive for covid yesterday. Symptoms started on 01/20/21. She started with headache. Then she developed body aches and fever. Thisis the second time she has had covid. She has been taking ibuprofen and tessalon perles. Problems:  Patient Active Problem List   Diagnosis Date Noted   Rectal bleeding 10/29/2016   Protein-calorie malnutrition, severe 01/13/2016   Dermatitis, eczematoid 07/27/2015   Epigastric pain 07/27/2015   Functional diarrhea 07/27/2015   GERD (gastroesophageal reflux disease) 07/13/2015   Asthma with COPD (HCC) 01/26/2012   Pulmonary fibrosis (HCC) 01/26/2012   Tobacco abuse 01/26/2012   Pulmonary nodule, left 01/26/2012    Allergies:  Allergies  Allergen Reactions   Amoxicillin Hives   Medications:  Current Outpatient Medications:    acetaminophen (TYLENOL) 500 MG tablet, Take 1,500 mg by mouth 3 (three) times daily. 2 to 3 times a day., Disp: , Rfl:    albuterol (PROVENTIL HFA;VENTOLIN HFA) 108 (90 Base) MCG/ACT inhaler, INHALE 2 PUFFS INTO THE LUNGS EVERY 6 HOURS AS NEEDED FOR WHEEZING OR SHORTNESS OF BREATH, Disp: 18 g, Rfl: 2  azithromycin (ZITHROMAX Z-PAK) 250 MG tablet, Take 2 tabs (500mg  total) on Day 1. Take 1 tab (250mg ) daily for next 4 days., Disp: 6 tablet, Rfl: 0   baclofen (LIORESAL) 10 MG tablet, Take 0.5-1 tablets (5-10 mg total) by mouth 3 (three) times daily as needed for muscle spasms., Disp: 90 tablet, Rfl: 3   benzonatate (TESSALON) 200 MG capsule, Take 1 capsule (200 mg total) by mouth 3 (three) times daily as needed for cough., Disp: 60 capsule, Rfl: 3   budesonide (PULMICORT) 0.25 MG/2ML nebulizer solution, Take  2 mLs (0.25 mg total) by nebulization every 12 (twelve) hours., Disp: 60 mL, Rfl: 2   fluticasone (FLONASE) 50 MCG/ACT nasal spray, Place 2 sprays into both nostrils daily., Disp: 9.9 mL, Rfl: 0   fluticasone (FLOVENT HFA) 220 MCG/ACT inhaler, Inhale 1 puff into the lungs in the morning and at bedtime., Disp: 1 each, Rfl: 12   hydrocortisone 2.5 % ointment, Apply topically 2 (two) times daily., Disp: 30 g, Rfl: 0   ibuprofen (ADVIL,MOTRIN) 800 MG tablet, Take 800 mg by mouth as needed., Disp: , Rfl:    loperamide (IMODIUM) 2 MG capsule, TAKE 1 CAPSULE BY MOUTH AS NEEDED FOR DIARRHEA OR LOOSE STOOLS, Disp: 30 capsule, Rfl: 0   loratadine (CLARITIN) 10 MG tablet, Take 10 mg by mouth daily., Disp: , Rfl:    meloxicam (MOBIC) 15 MG tablet, TAKE 1 TABLET BY MOUTH DAILY AS NEEDED FOR PAIN, Disp: 30 tablet, Rfl: 2   nicotine (NICODERM CQ - DOSED IN MG/24 HOURS) 14 mg/24hr patch, Apply once daily to the skin for 2 weeks after completing the 21 mg/hr patch., Disp: 14 patch, Rfl: 0   nicotine (NICODERM CQ - DOSED IN MG/24 HOURS) 21 mg/24hr patch, Place 1 patch (21 mg total) onto the skin daily., Disp: 28 patch, Rfl: 0   omeprazole (PRILOSEC) 20 MG capsule, Take 20 mg by mouth daily., Disp: , Rfl: 6   predniSONE (DELTASONE) 50 MG tablet, Take 1 tablet (50 mg total) by mouth daily with breakfast., Disp: 5 tablet, Rfl: 0  Observations/Objective: Patient is well-developed, well-nourished in no acute distress.  Resting comfortably  at home.  Head is normocephalic, atraumatic.  No labored breathing.  Speech is clear and coherent with logical content.  Patient is alert and oriented at baseline.  Deep cough Voice hoarse  Assessment and Plan:  [place] S Lannen in today with chief complaint of No chief complaint on file.   1. COVID-19 virus RNA test result positive at limit of detection 1. Take meds as prescribed 2. Use a cool mist humidifier especially during the winter months and when heat has been  humid. 3. Use saline nose sprays frequently 4. Saline irrigations of the nose can be very helpful if done frequently.  * 4X daily for 1 week*  * Use of a nettie pot can be helpful with this. Follow directions with this* 5. Drink plenty of fluids 6. Keep thermostat turn down low 7.For any cough or congestion  Use plain Mucinex- regular strength or max strength is fine   * Children- consult with Pharmacist for dosing 8. For fever or aces or pains- take tylenol or ibuprofen appropriate for age and weight.  * for fevers greater than 101 orally you may alternate ibuprofen and tylenol every  3 hours.   Meds ordered this encounter  Medications   molnupiravir EUA 200 mg CAPS    Sig: Take 4 capsules (800 mg total) by mouth 2 (two) times daily  for 5 days.    Dispense:  40 capsule    Refill:  0    Order Specific Question:   Supervising Provider    Answer:   Eber Hong [[id number]]       The above assessment and management plan was discussed with the patient. The patient verbalized understanding of and has agreed to the management plan. Patient is aware to call the clinic if symptoms persist or worsen. Patient is aware when to return to the clinic for a follow-up visit. Patient educated on when it is appropriate to go to the emergency department.   Mary-Margaret Daphine Deutscher, FNP    Follow Up Instructions: I discussed the assessment and treatment plan with the patient. The patient was provided an opportunity to ask questions and all were answered. The patient agreed with the plan and demonstrated an understanding of the instructions.  A copy of instructions were sent to the patient via MyChart.  The patient was advised to call back or seek an in-person evaluation if the symptoms worsen or if the condition fails to improve as anticipated.  Time:  I spent 11 minutes with the patient via telehealth technology discussing the above problems/concerns.    Mary-Margaret Daphine Deutscher, FNP

## 2021-01-28 ENCOUNTER — Encounter: Payer: Self-pay | Admitting: Family Medicine

## 2021-04-25 ENCOUNTER — Other Ambulatory Visit: Payer: Self-pay | Admitting: Family Medicine

## 2021-04-25 DIAGNOSIS — J441 Chronic obstructive pulmonary disease with (acute) exacerbation: Secondary | ICD-10-CM

## 2021-04-25 NOTE — Telephone Encounter (Signed)
Requested Prescriptions  Pending Prescriptions Disp Refills  . benzonatate (TESSALON) 200 MG capsule [Pharmacy Med Name: BENZONATATE 200MG  CAPSULES] 60 capsule 2    Sig: TAKE 1 CAPSULE(200 MG) BY MOUTH THREE TIMES DAILY AS NEEDED FOR COUGH     Ear, Nose, and Throat:  Antitussives/Expectorants Passed - 04/25/2021 12:35 PM      Passed - Valid encounter within last 12 months    Recent Outpatient Visits          9 months ago COPD with acute exacerbation [facility])   [facility], DO   1 year ago COPD with acute exacerbation [facility])   [facility], DO   2 years ago Suspected Covid-19 Virus Infection   [facility] [place], [place], [place]   3 years ago Acute non-recurrent maxillary sinusitis   [facility] [place], [place], [place]   3 years ago Acute left-sided thoracic back pain   [facility] [place], [place], [place]

## 2021-06-10 ENCOUNTER — Other Ambulatory Visit: Payer: Self-pay | Admitting: Family Medicine

## 2021-06-10 DIAGNOSIS — M545 Low back pain, unspecified: Secondary | ICD-10-CM

## 2021-06-10 NOTE — Telephone Encounter (Signed)
Requested medication (s) are due for refill today: yes  Requested medication (s) are on the active medication list: yes  Last refill:  05/06/21  Future visit scheduled: no  Notes to clinic:  This medication can not be delegated, please assess.    Requested Prescriptions  Pending Prescriptions Disp Refills   baclofen (LIORESAL) 10 MG tablet [Pharmacy Med Name: BACLOFEN 10MG  TABLETS] 90 tablet 3    Sig: TAKE 1/2 TO 1 TABLET(5 TO 10 MG) BY MOUTH THREE TIMES DAILY AS NEEDED FOR MUSCLE SPASMS     Not Delegated - Analgesics:  Muscle Relaxants Failed - 06/10/2021  3:13 AM      Failed - This refill cannot be delegated      Failed - Valid encounter within last 6 months    Recent Outpatient Visits           10 months ago COPD with acute exacerbation [facility])   [facility], DO   1 year ago COPD with acute exacerbation [facility])   [facility], DO   2 years ago Suspected Covid-19 Virus Infection   [facility] [place], [place], [place]   3 years ago Acute non-recurrent maxillary sinusitis   [facility] [place], [place], [place]   3 years ago Acute left-sided thoracic back pain   [facility] [place], [place], [place]

## 2021-06-30 ENCOUNTER — Telehealth: Payer: Self-pay | Admitting: Nurse Practitioner

## 2021-06-30 DIAGNOSIS — R197 Diarrhea, unspecified: Secondary | ICD-10-CM

## 2021-06-30 MED ORDER — LOPERAMIDE HCL 2 MG PO TABS: 2.0000 mg | ORAL_TABLET | Freq: Four times a day (QID) | ORAL | 0 refills | Status: DC | PRN

## 2021-06-30 NOTE — Progress Notes (Signed)
We are sorry that you are not feeling well.  Here is how we plan to help!  Based on what you have shared with me it looks like you have Acute Infectious Diarrhea.  Most cases of acute diarrhea are due to infections with virus and bacteria and are self-limited conditions lasting less than 14 days.  For your symptoms you may take Imodium 2 mg tablets now and then one after each loose stool up to 6 a day.  Antibiotics are not needed for most people with diarrhea.   HOME CARE We recommend changing your diet to help with your symptoms for the next few days. Drink plenty of fluids that contain water salt and sugar. Sports drinks such as Gatorade may help.  You may try broths, soups, bananas, applesauce, soft breads, mashed potatoes or crackers.  You are considered infectious for as long as the diarrhea continues. Hand washing or use of alcohol based hand sanitizers is recommend. It is best to stay out of work or school until your symptoms stop.   GET HELP RIGHT AWAY If you have dark yellow colored urine or do not pass urine frequently you should drink more fluids.   If your symptoms worsen  If you feel like you are going to pass out (faint) You have a new problem  MAKE SURE YOU  Understand these instructions. Will watch your condition. Will get help right away if you are not doing well or get worse.  Thank you for choosing an e-visit.  Your e-visit answers were reviewed by a board certified advanced clinical practitioner to complete your personal care plan. Depending upon the condition, your plan could have included both over the counter or prescription medications.  Please review your pharmacy choice. Make sure the pharmacy is open so you can pick up prescription now. If there is a problem, you may contact your provider through [employer] and have the prescription routed to another pharmacy.  Your safety is important to [place]. If you have drug allergies check your prescription  carefully.   For the next 24 hours you can use MyChart to ask questions about today's visit, request a non-urgent call back, or ask for a work or school excuse. You will get an email in the next two days asking about your experience. I hope that your e-visit has been valuable and will speed your recovery.

## 2021-06-30 NOTE — Progress Notes (Signed)
I have spent 5 minutes in review of e-visit questionnaire, review and updating patient chart, medical decision making and response to patient.  ° °Jacari Kirsten W Zuzanna Maroney, NP ° °  °

## 2021-09-02 ENCOUNTER — Other Ambulatory Visit: Payer: Self-pay | Admitting: Family Medicine

## 2021-09-02 DIAGNOSIS — J441 Chronic obstructive pulmonary disease with (acute) exacerbation: Secondary | ICD-10-CM

## 2021-09-03 NOTE — Telephone Encounter (Signed)
Requested medications are due for refill today.  yes ? ?Requested medications are on the active medications list.  yes ? ?Last refill. 07/18/2020  1 12 refills ? ?Future visit scheduled.   no ? ?Notes to clinic.  Medication not delegated ? ? ? ?Requested Prescriptions  ?Pending Prescriptions Disp Refills  ? fluticasone (FLOVENT HFA) 220 MCG/ACT inhaler [Pharmacy Med Name: FLUTICASONE HFA 220MCG ORAL INH] 12 g   ?  Sig: INHALE 1 PUFF INTO THE LUNGS IN THE MORNING AND AT BEDTIME  ?  ? Not Delegated - Pulmonology:  Corticosteroids 2 Failed - 09/02/2021 12:39 PM  ?  ?  Failed - This refill cannot be delegated  ?  ?  Failed - Valid encounter within last 12 months  ?  Recent Outpatient Visits   ? ?      ? 1 year ago COPD with acute exacerbation ([place])  ? [place], [place]  ? 2 years ago COPD with acute exacerbation [facility])  ? [place], DO  ? 2 years ago Suspected Covid-19 Virus Infection  ? [place], DO  ? 3 years ago Acute non-recurrent maxillary sinusitis  ? [place], DO  ? 3 years ago Acute left-sided thoracic back pain  ? [place], DO  ? ?  ?  ? ?  ?  ?  ?  ?

## 2021-10-06 ENCOUNTER — Telehealth: Payer: Self-pay | Admitting: Nurse Practitioner

## 2021-10-06 DIAGNOSIS — J4521 Mild intermittent asthma with (acute) exacerbation: Secondary | ICD-10-CM

## 2021-10-07 MED ORDER — ALBUTEROL SULFATE HFA 108 (90 BASE) MCG/ACT IN AERS: 2.0000 | INHALATION_SPRAY | Freq: Four times a day (QID) | RESPIRATORY_TRACT | 0 refills | Status: DC | PRN

## 2021-10-07 MED ORDER — PREDNISONE 20 MG PO TABS: 20.0000 mg | ORAL_TABLET | Freq: Two times a day (BID) | ORAL | 0 refills | Status: AC

## 2021-10-07 NOTE — Progress Notes (Signed)
Visit for Asthma ? ?Based on what you have shared with me, it looks like you may have a flare up of your asthma.  Asthma is a chronic (ongoing) lung disease which results in airway obstruction, inflammation and hyper-responsiveness.  ? ?Asthma symptoms vary from person to person, with common symptoms including nighttime awakening and decreased ability to participate in normal activities as a result of shortness of breath. It is often triggered by changes in weather, changes in the season, changes in air temperature, or inside (home, school, daycare or work) allergens such as animal dander, mold, mildew, woodstoves or cockroaches.   It can also be triggered by hormonal changes, extreme emotion, physical exertion or an upper respiratory tract illness.    ? ?It is important to identify the trigger, and then eliminate or avoid the trigger if possible.  ? ?If you have been prescribed medications to be taken on a regular basis, it is important to follow the asthma action plan and to follow guidelines to adjust medication in response to increasing symptoms of decreased peak expiratory flow rate ? ?Treatment: ?I have prescribed: Albuterol (Proventil HFA; Ventolin HFA) 108 (90 Base) MCG/ACT Inhaler 2 puffs into the lungs every six hours as needed for wheezing or shortness of breath and Prednisone 40mg  by mouth per day for 5 days ? ?HOME CARE ?Only take medications as instructed by your medical team. ?Consider wearing a mask or scarf to improve breathing air temperature have been shown to decrease irritation and decrease exacerbations ?Get rest. ?Taking a steamy shower or using a humidifier may help nasal congestion sand ease sore throat pain. You can place a towel over your head and breathe in the steam from hot water coming from a faucet. ?Using a saline nasal spray works much the same way.  ?Cough drops, hare  candies and sore throat lozenges may ease your cough.  ?Avoid close contacts especially the very you and the elderly ?Cover your mouth if you cough or sneeze ?Always remember to wash your hands.  ? ? ?GET HELP RIGHT AWAY IF: ?You develop worsening symptoms; breathlessness at rest, drowsy, confused or agitated, unable to speak in full sentences ?You have coughing fits ?You develop a severe headache or visual changes ?You develop shortness of breath, difficulty breathing or start having chest pain ?Your symptoms persist after you have completed your treatment plan ?If your symptoms do not improve within 10 days ? ?MAKE SURE YOU ?Understand these instructions. ?Will watch your condition. ?Will get help right away if you are not doing well or get worse.  ? ?Your e-visit answers were reviewed by a board certified advanced clinical practitioner to complete your personal care plan, Depending upon the condition, your plan could have included both over the counter or prescription medications.  ? ?Please review your pharmacy choice. ?Your safety is important to [place]. If you have drug allergies check your prescription carefully. ? ?You can use MyChart to ask questions about today's visit, request a non-urgent  ?call back, or ask for a work or school excuse for 24 hours related to this e-Visit. If it has been greater than 24 hours you will need to follow up with your provider, or enter a new e-Visit to address those concerns.  ? ?You will get an e-mail in the next two days asking about your experience. I hope that your e-visit has been valuable and will speed your recovery. Thank you for using e-visits.  ? ?I spent approximately 7 minutes reviewing the patient's history,  current symptoms and coordinating their plan of care today.   ? ?Meds ordered this encounter  ?Medications  ? albuterol (VENTOLIN HFA) 108 (90 Base) MCG/ACT inhaler  ?  Sig: Inhale 2 puffs into the lungs every 6 (six) hours as needed for wheezing or shortness of  breath.  ?  Dispense:  8 g  ?  Refill:  0  ? predniSONE (DELTASONE) 20 MG tablet  ?  Sig: Take 1 tablet (20 mg total) by mouth 2 (two) times daily with a meal for 5 days.  ?  Dispense:  10 tablet  ?  Refill:  0  ?  ?

## 2021-10-29 ENCOUNTER — Other Ambulatory Visit: Payer: Self-pay | Admitting: Nurse Practitioner

## 2021-10-29 ENCOUNTER — Other Ambulatory Visit: Payer: Self-pay | Admitting: Medical

## 2021-10-29 DIAGNOSIS — J4521 Mild intermittent asthma with (acute) exacerbation: Secondary | ICD-10-CM

## 2021-10-29 DIAGNOSIS — J449 Chronic obstructive pulmonary disease, unspecified: Secondary | ICD-10-CM

## 2021-10-29 DIAGNOSIS — Z760 Encounter for issue of repeat prescription: Secondary | ICD-10-CM

## 2021-10-29 MED ORDER — ALBUTEROL SULFATE HFA 108 (90 BASE) MCG/ACT IN AERS: 2.0000 | INHALATION_SPRAY | Freq: Four times a day (QID) | RESPIRATORY_TRACT | 2 refills | Status: DC | PRN

## 2021-10-29 NOTE — Progress Notes (Signed)
Refill on Albuterol MDI. Patient  pharmacy request. ?

## 2021-10-30 ENCOUNTER — Ambulatory Visit (INDEPENDENT_AMBULATORY_CARE_PROVIDER_SITE_OTHER): Payer: BLUE CROSS/BLUE SHIELD

## 2021-10-30 ENCOUNTER — Ambulatory Visit
Admission: RE | Admit: 2021-10-30 | Discharge: 2021-10-30 | Disposition: A | Discharge status: Home / Self Care | Payer: BLUE CROSS/BLUE SHIELD | Source: Ambulatory Visit | Attending: Emergency Medicine | Admitting: Emergency Medicine

## 2021-10-30 VITALS — BP 129/81 | HR 92 | Temp 98.4°F | Resp 18

## 2021-10-30 DIAGNOSIS — M25522 Pain in left elbow: Secondary | ICD-10-CM | POA: Diagnosis not present

## 2021-10-30 MED ORDER — MELOXICAM 15 MG PO TABS: 15.0000 mg | ORAL_TABLET | Freq: Every day | ORAL | 0 refills | Status: AC

## 2021-10-30 NOTE — ED Triage Notes (Signed)
Patient presents to Urgent Care with complaints of left elbow pain since Friday. Pt states she does a lot heavy lifting at work. Treating pain with advil.  ?

## 2021-10-30 NOTE — ED Provider Notes (Signed)
?[facility] ? ? ? ?CSN: [id number] ?Arrival date & time: 10/30/21  0901 ? ? ?  ? ?History   ?Chief Complaint ?Chief Complaint  ?Patient presents with  ? Arm Injury  ? ? ?HPI ?Joy Ball is a 56 y.o. female.  Patient presents with pain in her left elbow x5 to 6 days.  No falls or injury.  The pain radiates down her left arm to her hand.  She feels that the pain is exacerbated by heavy lifting at work.  Treatment with Tylenol and ibuprofen.  No numbness, weakness, redness, bruising, wounds, or other symptoms.  Her medical history includes asthma, emphysema, COPD, pulmonary fibrosis, pulmonary nodule, current everyday smoker, seasonal allergies, migraines, GERD, protein calorie malnutrition. ? ?The history is provided by the patient and medical records.  ? ?Past Medical History:  ?Diagnosis Date  ? Asthma   ? COPD (chronic obstructive pulmonary disease) (HCC)   ? Emphysema lung (HCC)   ? GERD (gastroesophageal reflux disease)   ? Migraines   ? Seasonal allergies   ? ? ?Patient Active Problem List  ? Diagnosis Date Noted  ? Rectal bleeding 10/29/2016  ? Protein-calorie malnutrition, severe 01/13/2016  ? Dermatitis, eczematoid 07/27/2015  ? Epigastric pain 07/27/2015  ? Functional diarrhea 07/27/2015  ? GERD (gastroesophageal reflux disease) 07/13/2015  ? Asthma with COPD (HCC) 01/26/2012  ? Pulmonary fibrosis (HCC) 01/26/2012  ? Tobacco abuse 01/26/2012  ? Pulmonary nodule, left 01/26/2012  ? ? ?Past Surgical History:  ?Procedure Laterality Date  ? CESAREAN SECTION    ? TUBAL LIGATION    ? ? ?OB History   ?No obstetric history on file. ?  ? ? ? ?Home Medications   ? ?Prior to Admission medications   ?Medication Sig Start Date End Date Taking? Authorizing Provider  ?meloxicam (MOBIC) 15 MG tablet Take 1 tablet (15 mg total) by mouth daily for 7 days. 10/30/21 11/06/21 Yes Mickie Bail, NP  ?acetaminophen (TYLENOL) 500 MG tablet Take 1,500 mg by mouth 3 (three) times daily. 2 to 3 times a day.    [provider]  ?albuterol (VENTOLIN HFA) 108 (90 Base) MCG/ACT inhaler INHALE 2 PUFFS INTO THE LUNGS EVERY 6 HOURS AS NEEDED FOR WHEEZING OR SHORTNESS OF BREATH 10/29/21   Ratcliffe, Heather R, PA-C  ?azithromycin (ZITHROMAX Z-PAK) 250 MG tablet Take 2 tabs (500mg  total) on Day 1. Take 1 tab (250mg ) daily for next 4 days. 07/18/20   Karamalegos, , DO  ?baclofen (LIORESAL) 10 MG tablet TAKE 1/2 TO 1 TABLET(5 TO 10 MG) BY MOUTH THREE TIMES DAILY AS NEEDED FOR MUSCLE SPASMS 06/11/21   Karamalegos, Netta Neat, DO  ?benzonatate (TESSALON) 200 MG capsule TAKE 1 CAPSULE(200 MG) BY MOUTH THREE TIMES DAILY AS NEEDED FOR COUGH 04/25/21   Karamalegos, Netta Neat, DO  ?budesonide (PULMICORT) 0.25 MG/2ML nebulizer solution Take 2 mLs (0.25 mg total) by nebulization every 12 (twelve) hours. 03/26/17   Netta Neat, NP  ?fluticasone (FLONASE) 50 MCG/ACT nasal spray Place 2 sprays into both nostrils daily. 01/26/20   Galen Manila, PA-C  ?fluticasone (FLOVENT HFA) 220 MCG/ACT inhaler Inhale 1 puff into the lungs in the morning and at bedtime. 07/18/20   Karamalegos, Roxy Horseman, DO  ?hydrocortisone 2.5 % ointment Apply topically 2 (two) times daily. 09/26/16   Netta Neat, MD  ?loperamide (IMODIUM A-D) 2 MG tablet Take 1 tablet (2 mg total) by mouth 4 (four) times daily as needed for diarrhea or loose stools. 06/30/21  Claiborne RiggFleming, Zelda W, NP  ?loratadine (CLARITIN) 10 MG tablet Take 10 mg by mouth daily.    [provider]  ?nicotine (NICODERM CQ - DOSED IN MG/24 HOURS) 14 mg/24hr patch Apply once daily to the skin for 2 weeks after completing the 21 mg/hr patch. 02/07/16   Krebs, Laurel DimmerAmy Lauren, NP  ?nicotine (NICODERM CQ - DOSED IN MG/24 HOURS) 21 mg/24hr patch Place 1 patch (21 mg total) onto the skin daily. 02/07/16   Loura PardonKrebs, Amy Lauren, NP  ?omeprazole (PRILOSEC) 20 MG capsule Take 20 mg by mouth daily. 06/27/14   [provider]  ? ? ?Family History ?Family History  ?Problem Relation Age of Onset   ? Lung cancer Father   ?     was a smoker  ? Heart disease Maternal Grandfather   ? Emphysema Mother   ? ? ?Social History ?Social History  ? ?Tobacco Use  ? Smoking status: Every Day  ?  Packs/day: 1.50  ?  Years: 30.00  ?  Pack years: 45.00  ?  Types: Cigarettes  ? Smokeless tobacco: Current  ?Substance Use Topics  ? Alcohol use: Yes  ?  Alcohol/week: 0.0 standard drinks  ?  Comment: occ  ? Drug use: No  ? ? ? ?Allergies   ?Amoxicillin ? ? ?Review of Systems ?Review of Systems  ?Constitutional:  Negative for fever.  ?Musculoskeletal:  Positive for arthralgias. Negative for joint swelling.  ?Skin:  Negative for color change, rash and wound.  ?Neurological:  Negative for weakness and numbness.  ?All other systems reviewed and are negative. ? ? ?Physical Exam ?Triage Vital Signs ?ED Triage Vitals [10/30/21 0912]  ?Enc Vitals Group  ?   BP 129/81  ?   Pulse Rate 92  ?   Resp 18  ?   Temp 98.4 ?F (36.9 ?C)  ?   Temp src   ?   SpO2 95 %  ?   Weight   ?   Height   ?   Head Circumference   ?   Peak Flow   ?   Pain Score   ?   Pain Loc   ?   Pain Edu?   ?   Excl. in GC?   ? ?No data found. ? ?Updated Vital Signs ?BP 129/81   Pulse 92   Temp 98.4 ?F (36.9 ?C)   Resp 18   SpO2 95%  ? ?Visual Acuity ?Right Eye Distance:   ?Left Eye Distance:   ?Bilateral Distance:   ? ?Right Eye Near:   ?Left Eye Near:    ?Bilateral Near:    ? ?Physical Exam ?Vitals and nursing note reviewed.  ?Constitutional:   ?   General: She is not in acute distress. ?   Appearance: Normal appearance. She is well-developed. She is not ill-appearing.  ?HENT:  ?   Mouth/Throat:  ?   Mouth: Mucous membranes are moist.  ?Cardiovascular:  ?   Rate and Rhythm: Normal rate and regular rhythm.  ?Pulmonary:  ?   Effort: Pulmonary effort is normal. No respiratory distress.  ?   Breath sounds: Normal breath sounds.  ?Abdominal:  ?   Tenderness: There is no abdominal tenderness.  ?Musculoskeletal:     ?   General: Tenderness present. No swelling, deformity or  signs of injury. Normal range of motion.  ?     Arms: ? ?   Cervical back: Neck supple.  ?   Comments: Mild tenderness to palpation of inner elbow.  Full ROM, sensation intact, strength 5/5, 2+ pulses.  No erythema, ecchymosis, edema, wounds.  ?Skin: ?   General: Skin is warm and dry.  ?   Capillary Refill: Capillary refill takes less than 2 seconds.  ?   Findings: No bruising, erythema, lesion or rash.  ?Neurological:  ?   General: No focal deficit present.  ?   Mental Status: She is alert and oriented to person, place, and time.  ?   Sensory: No sensory deficit.  ?   Motor: No weakness.  ?Psychiatric:     ?   Mood and Affect: Mood normal.     ?   Behavior: Behavior normal.  ? ? ? ?UC Treatments / Results  ?Labs ?(all labs ordered are listed, but only abnormal results are displayed) ?Labs Reviewed - No data to display ? ?EKG ? ? ?Radiology ?DG Elbow Complete Left ? ?Result Date: 10/30/2021 ?CLINICAL DATA:  Pain. No known injury. Reports heavy lifting at work. EXAM: LEFT ELBOW - COMPLETE 3+ VIEW COMPARISON:  None Available. FINDINGS: No signs of acute fracture or dislocation. No joint effusion identified. No significant arthropathy. Soft tissues are unremarkable. IMPRESSION: Negative. Electronically Signed   By: Signa Kell M.D.   On: 10/30/2021 09:53   ? ?Procedures ?Procedures (including critical care time) ? ?Medications Ordered in UC ?Medications - No data to display ? ?Initial Impression / Assessment and Plan / UC Course  ?I have reviewed the triage vital signs and the nursing notes. ? ?Pertinent labs & imaging results that were available during my care of the patient were reviewed by me and considered in my medical decision making (see chart for details). ? ?Left elbow pain.  X-ray negative.  No indication of infection or injury.  Treating with refill of meloxicam.  Instructed patient to follow-up with an orthopedist.  She agrees to plan of care. ? ? ?Final Clinical Impressions(s) / UC Diagnoses  ? ?Final  diagnoses:  ?Left elbow pain  ? ? ? ?Discharge Instructions   ? ?  ?Continue taking meloxicam as directed.  Follow-up with an orthopedist. ? ? ? ? ?ED Prescriptions   ? ? Medication Sig Dispense Auth. Provider  ?

## 2021-10-30 NOTE — Discharge Instructions (Addendum)
Continue taking meloxicam as directed.  Follow-up with an orthopedist. ?

## 2021-11-30 ENCOUNTER — Telehealth: Payer: BLUE CROSS/BLUE SHIELD | Admitting: Nurse Practitioner

## 2021-11-30 DIAGNOSIS — J4521 Mild intermittent asthma with (acute) exacerbation: Secondary | ICD-10-CM

## 2021-11-30 MED ORDER — PREDNISONE 20 MG PO TABS: 40.0000 mg | ORAL_TABLET | Freq: Every day | ORAL | 0 refills | Status: AC

## 2021-11-30 NOTE — Progress Notes (Signed)
Visit for Asthma  Based on what you have shared with me, it looks like you may have a flare up of your asthma.  Asthma is a chronic (ongoing) lung disease which results in airway obstruction, inflammation and hyper-responsiveness.   Asthma symptoms vary from person to person, with common symptoms including nighttime awakening and decreased ability to participate in normal activities as a result of shortness of breath. It is often triggered by changes in weather, changes in the season, changes in air temperature, or inside (home, school, daycare or work) allergens such as animal dander, mold, mildew, woodstoves or cockroaches.   It can also be triggered by hormonal changes, extreme emotion, physical exertion or an upper respiratory tract illness.     It is important to identify the trigger, and then eliminate or avoid the trigger if possible.   If you have been prescribed medications to be taken on a regular basis, it is important to follow the asthma action plan and to follow guidelines to adjust medication in response to increasing symptoms of decreased peak expiratory flow rate  Treatment: I have prescribed: Prednisone 40mg by mouth per day for 5 - 7 days  HOME CARE Only take medications as instructed by your medical team. Consider wearing a mask or scarf to improve breathing air temperature have been shown to decrease irritation and decrease exacerbations Get rest. Taking a steamy shower or using a humidifier may help nasal congestion sand ease sore throat pain. You can place a towel over your head and breathe in the steam from hot water coming from a faucet. Using a saline nasal spray works much the same way.  Cough drops, hare candies and sore throat lozenges may ease your cough.  Avoid close contacts especially the very you and the elderly Cover your mouth if you cough or  sneeze Always remember to wash your hands.    GET HELP RIGHT AWAY IF: You develop worsening symptoms; breathlessness at rest, drowsy, confused or agitated, unable to speak in full sentences You have coughing fits You develop a severe headache or visual changes You develop shortness of breath, difficulty breathing or start having chest pain Your symptoms persist after you have completed your treatment plan If your symptoms do not improve within 10 days  MAKE SURE YOU Understand these instructions. Will watch your condition. Will get help right away if you are not doing well or get worse.   Your e-visit answers were reviewed by a board certified advanced clinical practitioner to complete your personal care plan, Depending upon the condition, your plan could have included both over the counter or prescription medications.   Please review your pharmacy choice. Your safety is important to us. If you have drug allergies check your prescription carefully.  You can use MyChart to ask questions about today's visit, request a non-urgent  call back, or ask for a work or school excuse for 24 hours related to this e-Visit. If it has been greater than 24 hours you will need to follow up with your provider, or enter a new e-Visit to address those concerns.   You will get an e-mail in the next two days asking about your experience. I hope that your e-visit has been valuable and will speed your recovery. Thank you for using e-visits.  5-10 minutes spent reviewing and documenting in chart.   

## 2022-03-30 ENCOUNTER — Telehealth: Payer: BLUE CROSS/BLUE SHIELD | Admitting: Physician Assistant

## 2022-03-30 DIAGNOSIS — B370 Candidal stomatitis: Secondary | ICD-10-CM

## 2022-03-30 DIAGNOSIS — J069 Acute upper respiratory infection, unspecified: Secondary | ICD-10-CM

## 2022-03-30 MED ORDER — NYSTATIN 100000 UNIT/ML MT SUSP: 5.0000 mL | Freq: Four times a day (QID) | OROMUCOSAL | 0 refills | Status: DC

## 2022-03-30 MED ORDER — NYSTATIN 100000 UNIT/ML MT SUSP
5.0000 mL | Freq: Four times a day (QID) | OROMUCOSAL | 0 refills | Status: DC
Start: 2022-03-30 — End: 2022-08-20

## 2022-03-30 MED ORDER — FLUTICASONE PROPIONATE 50 MCG/ACT NA SUSP: 2.0000 | Freq: Every day | NASAL | 0 refills | Status: DC

## 2022-03-30 MED ORDER — BENZONATATE 100 MG PO CAPS: 100.0000 mg | ORAL_CAPSULE | Freq: Three times a day (TID) | ORAL | 0 refills | Status: DC | PRN

## 2022-03-30 NOTE — Progress Notes (Signed)
I have spent 5 minutes in review of e-visit questionnaire, review and updating patient chart, medical decision making and response to patient.   Maayan Jenning Cody Kylei Purington, PA-C    

## 2022-03-30 NOTE — Progress Notes (Signed)
E-Visit for Upper Respiratory Infection   We are sorry you are not feeling well.  Here is how we plan to help!  Based on what you have shared with me, it looks like you may have a viral upper respiratory infection.  Upper respiratory infections are caused by a large number of viruses; however, rhinovirus is the most common cause.   Symptoms vary from person to person, with common symptoms including sore throat, cough, fatigue or lack of energy and feeling of general discomfort.  A low-grade fever of up to 100.4 may present, but is often uncommon.  Symptoms vary however, and are closely related to a person's age or underlying illnesses.  The most common symptoms associated with an upper respiratory infection are nasal discharge or congestion, cough, sneezing, headache and pressure in the ears and face.  These symptoms usually persist for about 3 to 10 days, but can last up to 2 weeks.  It is important to know that upper respiratory infections do not cause serious illness or complications in most cases.    Upper respiratory infections can be transmitted from person to person, with the most common method of transmission being a person's hands.  The virus is able to live on the skin and can infect other persons for up to 2 hours after direct contact.  Also, these can be transmitted when someone coughs or sneezes; thus, it is important to cover the mouth to reduce this risk.  To keep the spread of the illness at [place], good hand hygiene is very important.  This is an infection that is most likely caused by a virus. There are no specific treatments other than to help you with the symptoms until the infection runs its course.  We are sorry you are not feeling well.  Here is how we plan to help!   For nasal congestion, you may use an oral decongestants such as Mucinex D or if you have glaucoma or high blood pressure use plain Mucinex.  Saline nasal spray or nasal drops can help and can safely be used as often as  needed for congestion.  For your congestion, I have prescribed Fluticasone nasal spray one spray in each nostril twice a day  If you do not have a history of heart disease, hypertension, diabetes or thyroid disease, prostate/bladder issues or glaucoma, you may also use Sudafed to treat nasal congestion.  It is highly recommended that you consult with a pharmacist or your primary care physician to ensure this medication is safe for you to take.     If you have a cough, you may use cough suppressants such as Delsym and Robitussin.  If you have glaucoma or high blood pressure, you can also use Coricidin HBP.   For cough I have prescribed for you A prescription cough medication called Tessalon Perles 100 mg. You may take 1-2 capsules every 8 hours as needed for cough  If you have a sore or scratchy throat, use a saltwater gargle-  to  teaspoon of salt dissolved in a 4-ounce to 8-ounce glass of warm water.  Gargle the solution for approximately 15-30 seconds and then spit.  It is important not to swallow the solution.  You can also use throat lozenges/cough drops and Chloraseptic spray to help with throat pain or discomfort.  Warm or cold liquids can also be helpful in relieving throat pain.  For thrush, I have sent in nystatin suspension to swish, gargle and spit as directed.   For headache,  pain or general discomfort, you can use Ibuprofen or Tylenol as directed.   Some authorities believe that zinc sprays or the use of Echinacea may shorten the course of your symptoms.   HOME CARE Only take medications as instructed by your medical team. Be sure to drink plenty of fluids. Water is fine as well as fruit juices, sodas and electrolyte beverages. You may want to stay away from caffeine or alcohol. If you are nauseated, try taking small sips of liquids. How do you know if you are getting enough fluid? Your urine should be a pale yellow or almost colorless. Get rest. Taking a steamy shower or using a  humidifier may help nasal congestion and ease sore throat pain. You can place a towel over your head and breathe in the steam from hot water coming from a faucet. Using a saline nasal spray works much the same way. Cough drops, hard candies and sore throat lozenges may ease your cough. Avoid close contacts especially the very young and the elderly Cover your mouth if you cough or sneeze Always remember to wash your hands.   GET HELP RIGHT AWAY IF: You develop worsening fever. If your symptoms do not improve within 10 days You develop yellow or green discharge from your nose over 3 days. You have coughing fits You develop a severe head ache or visual changes. You develop shortness of breath, difficulty breathing or start having chest pain Your symptoms persist after you have completed your treatment plan  MAKE SURE YOU  Understand these instructions. Will watch your condition. Will get help right away if you are not doing well or get worse.  Thank you for choosing an e-visit.  Your e-visit answers were reviewed by a board certified advanced clinical practitioner to complete your personal care plan. Depending upon the condition, your plan could have included both over the counter or prescription medications.  Please review your pharmacy choice. Make sure the pharmacy is open so you can pick up prescription now. If there is a problem, you may contact your provider through [employer] and have the prescription routed to another pharmacy.  Your safety is important to [place]. If you have drug allergies check your prescription carefully.   For the next 24 hours you can use MyChart to ask questions about today's visit, request a non-urgent call back, or ask for a work or school excuse. You will get an email in the next two days asking about your experience. I hope that your e-visit has been valuable and will speed your recovery.

## 2022-04-29 ENCOUNTER — Telehealth: Payer: BLUE CROSS/BLUE SHIELD | Admitting: Family Medicine

## 2022-04-29 DIAGNOSIS — J4489 Other specified chronic obstructive pulmonary disease: Secondary | ICD-10-CM | POA: Diagnosis not present

## 2022-04-29 DIAGNOSIS — J40 Bronchitis, not specified as acute or chronic: Secondary | ICD-10-CM | POA: Diagnosis not present

## 2022-04-29 DIAGNOSIS — J4521 Mild intermittent asthma with (acute) exacerbation: Secondary | ICD-10-CM | POA: Diagnosis not present

## 2022-04-29 MED ORDER — DOXYCYCLINE HYCLATE 100 MG PO TABS: 100.0000 mg | ORAL_TABLET | Freq: Two times a day (BID) | ORAL | 0 refills | Status: AC

## 2022-04-29 MED ORDER — PROMETHAZINE-DM 6.25-15 MG/5ML PO SYRP: 5.0000 mL | ORAL_SOLUTION | Freq: Three times a day (TID) | ORAL | 0 refills | Status: DC | PRN

## 2022-04-29 MED ORDER — PREDNISONE 20 MG PO TABS: 40.0000 mg | ORAL_TABLET | Freq: Every day | ORAL | 0 refills | Status: AC

## 2022-04-29 MED ORDER — ALBUTEROL SULFATE HFA 108 (90 BASE) MCG/ACT IN AERS: 2.0000 | INHALATION_SPRAY | Freq: Four times a day (QID) | RESPIRATORY_TRACT | 0 refills | Status: DC | PRN

## 2022-04-29 NOTE — Progress Notes (Signed)
[place]   Needs to switch to [facility] to discuss symptoms in more detail.

## 2022-04-29 NOTE — Progress Notes (Signed)
Virtual Visit Consent   Joy Ball, you are scheduled for a virtual visit with a [facility] provider today. Just as with appointments in the office, your consent must be obtained to participate. Your consent will be active for this visit and any virtual visit you may have with one of our providers in the next 365 days. If you have a MyChart account, a copy of this consent can be sent to you electronically.  As this is a virtual visit, video technology does not allow for your provider to perform a traditional examination. This may limit your provider's ability to fully assess your condition. If your provider identifies any concerns that need to be evaluated in person or the need to arrange testing (such as labs, EKG, etc.), we will make arrangements to do so. Although advances in technology are sophisticated, we cannot ensure that it will always work on either your end or our end. If the connection with a video visit is poor, the visit may have to be switched to a telephone visit. With either a video or telephone visit, we are not always able to ensure that we have a secure connection.  By engaging in this virtual visit, you consent to the provision of healthcare and authorize for your insurance to be billed (if applicable) for the services provided during this visit. Depending on your insurance coverage, you may receive a charge related to this service.  I need to obtain your verbal consent now. Are you willing to proceed with your visit today? Joy Ball has provided verbal consent on 04/29/2022 for a virtual visit (video or telephone). Joy Finner, NP  Date: 04/29/2022 11:22 AM  Virtual Visit via Video Note   I, Joy Ball, connected with  Joy Ball  ([id number], [birth year]) on 04/29/22 at 11:00 AM EST by a video-enabled telemedicine application and verified that I am speaking with the correct person using two identifiers.  Location: Patient: Virtual Visit Location Patient:  Home Provider: Virtual Visit Location Provider: Home Office   I discussed the limitations of evaluation and management by telemedicine and the availability of in person appointments. The patient expressed understanding and agreed to proceed.    History of Present Illness: Joy Ball is a 56 y.o. who identifies as a female who was assigned female at birth, and is being seen today for cough that is worsening in last 2 days. Cold chills, runny/congestion, clear mucus, coughing, chest tightness. Reports using nebs and inhalers. Has tried to use rescue inhaler up to 3 times daily, had used nebs once daily. Tried to use mucinex. Has also had some diarrhea. Denies n/v. Fever 101 Shortness of breath- having trouble completing sentences at work yesterday. Trouble catching breath with moving around. Negative COVID test Did have some coworkers sick. Denies chest pain, no sharp pains with deep breath. Did have a Viral URI at start of Oct- reports not really improving much from that and then two days ago she started to worsen again.  Problems:  Patient Active Problem List   Diagnosis Date Noted   Rectal bleeding 10/29/2016   Protein-calorie malnutrition, severe 01/13/2016   Dermatitis, eczematoid 07/27/2015   Epigastric pain 07/27/2015   Functional diarrhea 07/27/2015   GERD (gastroesophageal reflux disease) 07/13/2015   Asthma with COPD 01/26/2012   Pulmonary fibrosis (HCC) 01/26/2012   Tobacco abuse 01/26/2012   Pulmonary nodule, left 01/26/2012    Allergies:  Allergies  Allergen Reactions   Amoxicillin Hives   Medications:  Current Outpatient Medications:    doxycycline (VIBRA-TABS) 100 MG tablet, Take 1 tablet (100 mg total) by mouth 2 (two) times daily for 10 days., Disp: 20 tablet, Rfl: 0   predniSONE (DELTASONE) 20 MG tablet, Take 2 tablets (40 mg total) by mouth daily with breakfast for 5 days., Disp: 10 tablet, Rfl: 0   promethazine-dextromethorphan (PROMETHAZINE-DM) 6.25-15  MG/5ML syrup, Take 5 mLs by mouth 3 (three) times daily as needed for cough., Disp: 118 mL, Rfl: 0   acetaminophen (TYLENOL) 500 MG tablet, Take 1,500 mg by mouth 3 (three) times daily. 2 to 3 times a day., Disp: , Rfl:    albuterol (VENTOLIN HFA) 108 (90 Base) MCG/ACT inhaler, Inhale 2 puffs into the lungs every 6 (six) hours as needed for wheezing or shortness of breath., Disp: 8 g, Rfl: 0   azithromycin (ZITHROMAX Z-PAK) 250 MG tablet, Take 2 tabs (500mg  total) on Day 1. Take 1 tab (250mg ) daily for next 4 days., Disp: 6 tablet, Rfl: 0   baclofen (LIORESAL) 10 MG tablet, TAKE 1/2 TO 1 TABLET(5 TO 10 MG) BY MOUTH THREE TIMES DAILY AS NEEDED FOR MUSCLE SPASMS, Disp: 90 tablet, Rfl: 3   benzonatate (TESSALON) 100 MG capsule, Take 1 capsule (100 mg total) by mouth 3 (three) times daily as needed for cough., Disp: 30 capsule, Rfl: 0   budesonide (PULMICORT) 0.25 MG/2ML nebulizer solution, Take 2 mLs (0.25 mg total) by nebulization every 12 (twelve) hours., Disp: 60 mL, Rfl: 2   fluticasone (FLONASE) 50 MCG/ACT nasal spray, Place 2 sprays into both nostrils daily., Disp: 16 g, Rfl: 0   fluticasone (FLOVENT HFA) 220 MCG/ACT inhaler, Inhale 1 puff into the lungs in the morning and at bedtime., Disp: 1 each, Rfl: 12   hydrocortisone 2.5 % ointment, Apply topically 2 (two) times daily., Disp: 30 g, Rfl: 0   loperamide (IMODIUM A-D) 2 MG tablet, Take 1 tablet (2 mg total) by mouth 4 (four) times daily as needed for diarrhea or loose stools., Disp: 30 tablet, Rfl: 0   loratadine (CLARITIN) 10 MG tablet, Take 10 mg by mouth daily., Disp: , Rfl:    nicotine (NICODERM CQ - DOSED IN MG/24 HOURS) 14 mg/24hr patch, Apply once daily to the skin for 2 weeks after completing the 21 mg/hr patch., Disp: 14 patch, Rfl: 0   nicotine (NICODERM CQ - DOSED IN MG/24 HOURS) 21 mg/24hr patch, Place 1 patch (21 mg total) onto the skin daily., Disp: 28 patch, Rfl: 0   nystatin (MYCOSTATIN) 100000 UNIT/ML suspension, Take 5 mLs  (500,000 Units total) by mouth 4 (four) times daily., Disp: 120 mL, Rfl: 0   omeprazole (PRILOSEC) 20 MG capsule, Take 20 mg by mouth daily., Disp: , Rfl: 6  Observations/Objective: Patient is well-developed, well-nourished in no acute distress.  Resting comfortably  at home.  Head is normocephalic, atraumatic.  No labored breathing.  Speech is clear and coherent with logical content.  Patient is alert and oriented at baseline.  Cough present Nasal tone in voice noted   Assessment and Plan: 1. Asthma with COPD  - predniSONE (DELTASONE) 20 MG tablet; Take 2 tablets (40 mg total) by mouth daily with breakfast for 5 days.  Dispense: 10 tablet; Refill: 0 - doxycycline (VIBRA-TABS) 100 MG tablet; Take 1 tablet (100 mg total) by mouth 2 (two) times daily for 10 days.  Dispense: 20 tablet; Refill: 0  2. Bronchitis  - predniSONE (DELTASONE) 20 MG tablet; Take 2 tablets (40 mg total) by mouth  daily with breakfast for 5 days.  Dispense: 10 tablet; Refill: 0 - doxycycline (VIBRA-TABS) 100 MG tablet; Take 1 tablet (100 mg total) by mouth 2 (two) times daily for 10 days.  Dispense: 20 tablet; Refill: 0 - promethazine-dextromethorphan (PROMETHAZINE-DM) 6.25-15 MG/5ML syrup; Take 5 mLs by mouth 3 (three) times daily as needed for cough.  Dispense: 118 mL; Refill: 0  3. Mild intermittent asthma with acute exacerbation  - albuterol (VENTOLIN HFA) 108 (90 Base) MCG/ACT inhaler; Inhale 2 puffs into the lungs every 6 (six) hours as needed for wheezing or shortness of breath.  Dispense: 8 g; Refill: 0  -given recent URI with limited improvement and hx of COPD will provided Abx, pred and the above treatment plan -strict in person precautions reviewed -hydrate -rest -continue with inhalers and nebs as previously ordered   Reviewed side effects, risks and benefits of medication.    Patient acknowledged agreement and understanding of the plan.   Past Medical, Surgical, Social History, Allergies, and  Medications have been Reviewed.   Follow Up Instructions: I discussed the assessment and treatment plan with the patient. The patient was provided an opportunity to ask questions and all were answered. The patient agreed with the plan and demonstrated an understanding of the instructions.  A copy of instructions were sent to the patient via MyChart unless otherwise noted below.    The patient was advised to call back or seek an in-person evaluation if the symptoms worsen or if the condition fails to improve as anticipated.  Time:  I spent 20 minutes with the patient via telehealth technology discussing the above problems/concerns.    Perlie Mayo, NP

## 2022-04-29 NOTE — Patient Instructions (Addendum)
Joy Ball, thank you for joining Perlie Mayo, NP for today's virtual visit.  While this provider is not your primary care provider (PCP), if your PCP is located in our provider database this encounter information will be shared with them immediately following your visit.   [facility] account gives you access to today's visit and all your visits, tests, and labs performed at [facility] " click here if you don't have a [facility] account or go to [email or web address]  Consent: (Patient) Joy Ball provided verbal consent for this virtual visit at the beginning of the encounter.  Current Medications:  Current Outpatient Medications:    doxycycline (VIBRA-TABS) 100 MG tablet, Take 1 tablet (100 mg total) by mouth 2 (two) times daily for 10 days., Disp: 20 tablet, Rfl: 0   predniSONE (DELTASONE) 20 MG tablet, Take 2 tablets (40 mg total) by mouth daily with breakfast for 5 days., Disp: 10 tablet, Rfl: 0   promethazine-dextromethorphan (PROMETHAZINE-DM) 6.25-15 MG/5ML syrup, Take 5 mLs by mouth 3 (three) times daily as needed for cough., Disp: 118 mL, Rfl: 0   acetaminophen (TYLENOL) 500 MG tablet, Take 1,500 mg by mouth 3 (three) times daily. 2 to 3 times a day., Disp: , Rfl:    albuterol (VENTOLIN HFA) 108 (90 Base) MCG/ACT inhaler, Inhale 2 puffs into the lungs every 6 (six) hours as needed for wheezing or shortness of breath., Disp: 8 g, Rfl: 0   azithromycin (ZITHROMAX Z-PAK) 250 MG tablet, Take 2 tabs (500mg  total) on Day 1. Take 1 tab (250mg ) daily for next 4 days., Disp: 6 tablet, Rfl: 0   baclofen (LIORESAL) 10 MG tablet, TAKE 1/2 TO 1 TABLET(5 TO 10 MG) BY MOUTH THREE TIMES DAILY AS NEEDED FOR MUSCLE SPASMS, Disp: 90 tablet, Rfl: 3   benzonatate (TESSALON) 100 MG capsule, Take 1 capsule (100 mg total) by mouth 3 (three) times daily as needed for cough., Disp: 30 capsule, Rfl: 0   budesonide (PULMICORT) 0.25 MG/2ML nebulizer solution, Take 2  mLs (0.25 mg total) by nebulization every 12 (twelve) hours., Disp: 60 mL, Rfl: 2   fluticasone (FLONASE) 50 MCG/ACT nasal spray, Place 2 sprays into both nostrils daily., Disp: 16 g, Rfl: 0   fluticasone (FLOVENT HFA) 220 MCG/ACT inhaler, Inhale 1 puff into the lungs in the morning and at bedtime., Disp: 1 each, Rfl: 12   hydrocortisone 2.5 % ointment, Apply topically 2 (two) times daily., Disp: 30 g, Rfl: 0   loperamide (IMODIUM A-D) 2 MG tablet, Take 1 tablet (2 mg total) by mouth 4 (four) times daily as needed for diarrhea or loose stools., Disp: 30 tablet, Rfl: 0   loratadine (CLARITIN) 10 MG tablet, Take 10 mg by mouth daily., Disp: , Rfl:    nicotine (NICODERM CQ - DOSED IN MG/24 HOURS) 14 mg/24hr patch, Apply once daily to the skin for 2 weeks after completing the 21 mg/hr patch., Disp: 14 patch, Rfl: 0   nicotine (NICODERM CQ - DOSED IN MG/24 HOURS) 21 mg/24hr patch, Place 1 patch (21 mg total) onto the skin daily., Disp: 28 patch, Rfl: 0   nystatin (MYCOSTATIN) 100000 UNIT/ML suspension, Take 5 mLs (500,000 Units total) by mouth 4 (four) times daily., Disp: 120 mL, Rfl: 0   omeprazole (PRILOSEC) 20 MG capsule, Take 20 mg by mouth daily., Disp: , Rfl: 6   Medications ordered in this encounter:  Meds ordered this encounter  Medications   predniSONE (DELTASONE) 20 MG tablet  Sig: Take 2 tablets (40 mg total) by mouth daily with breakfast for 5 days.    Dispense:  10 tablet    Refill:  0    Order Specific Question:   Supervising Provider    Answer:   Merrilee Jansky [id number]   doxycycline (VIBRA-TABS) 100 MG tablet    Sig: Take 1 tablet (100 mg total) by mouth 2 (two) times daily for 10 days.    Dispense:  20 tablet    Refill:  0    Order Specific Question:   Supervising Provider    Answer:   Merrilee Jansky [id number]   promethazine-dextromethorphan (PROMETHAZINE-DM) 6.25-15 MG/5ML syrup    Sig: Take 5 mLs by mouth 3 (three) times daily as needed for cough.    Dispense:  118  mL    Refill:  0    Order Specific Question:   Supervising Provider    Answer:   Merrilee Jansky [[id number]]   albuterol (VENTOLIN HFA) 108 (90 Base) MCG/ACT inhaler    Sig: Inhale 2 puffs into the lungs every 6 (six) hours as needed for wheezing or shortness of breath.    Dispense:  8 g    Refill:  0    Order Specific Question:   Supervising Provider    Answer:   Merrilee Jansky [id number]     *If you need refills on other medications prior to your next appointment, please contact your pharmacy*  Follow-Up: Call back or seek an in-person evaluation if the symptoms worsen or if the condition fails to improve as anticipated.  [facility] [phone]  Other Instructions  Start Doxycyline and complete full course Take all medications as directed  If you continue to shortness of breath, develop brown or red mucus, pain with breathing, chest pain or fevers that will not improve- please go be seen in person at the ED.   If you have been instructed to have an in-person evaluation today at a local Urgent Care facility, please use the link below. It will take you to a list of all of our available [facility], including address, phone number and hours of operation. Please do not delay care.  [facility]  If you or a family member do not have a primary care provider, use the link below to schedule a visit and establish care. When you choose a [facility] primary care physician or advanced practice provider, you gain a long-term partner in health. Find a Primary Care Provider  Learn more about [facility]'s in-office and virtual care options: [facility] - Get Care Now

## 2022-06-03 ENCOUNTER — Encounter: Payer: BLUE CROSS/BLUE SHIELD | Admitting: Nurse Practitioner

## 2022-06-03 NOTE — Progress Notes (Signed)
Joy Ball,  After reviewing your chart and your most recent visits, I feel your condition warrants further evaluation and I recommend that you be seen in a face to face visit.  I would recommend starting with your primary care office. If they cannot see you, I have included a list of urgent care facilities below.   Given your respiratory history, it would be best for you to have a chest Xray to assess your condition and we cannot currently provide that service.     NOTE: There will be NO CHARGE for this eVisit   If you are having a true medical emergency please call 911.      For an urgent face to face visit, [facility] has seven urgent care centers for your convenience:     [facility] at [facility] Directions [phone] [address] [place], [place] [address]    [facility] Urgent Care Center [facility]) Get Driving Directions [phone] [address] [place], [place] [address]  [facility] Health Urgent Care Center [facility]) Get Driving Directions [phone] [address] [place],  [place]  [address]  [facility] Center [facility] - at [employer] Directions  [phone] [address] [place],  [place] [address]   [facility] Urgent Care at [facility] Get Driving Directions [phone] [address] [place], [place] [address]   [facility] Health Urgent Care at [facility] Get Driving Directions  [phone] [address] [place], [place] [address]   [facility] at [facility] Directions [phone] [address] [place], [place] [address]  Your MyChart E-visit questionnaire answers were reviewed by a board certified advanced clinical practitioner to complete your personal care plan based on your specific symptoms.  Thank you for using e-Visits.

## 2022-08-15 ENCOUNTER — Other Ambulatory Visit: Payer: Self-pay | Admitting: Family Medicine

## 2022-08-15 DIAGNOSIS — J441 Chronic obstructive pulmonary disease with (acute) exacerbation: Secondary | ICD-10-CM

## 2022-08-15 DIAGNOSIS — M545 Low back pain, unspecified: Secondary | ICD-10-CM

## 2022-08-18 NOTE — Telephone Encounter (Signed)
Requested medication (s) are due for refill today: Yes  Requested medication (s) are on the active medication list: Yes  Last refill:     Future visit scheduled: No  Notes to clinic:  Left message to make appointment.    Requested Prescriptions  Pending Prescriptions Disp Refills   baclofen (LIORESAL) 10 MG tablet [Pharmacy Med Name: BACLOFEN '10MG'$  TABLETS] 90 tablet 3    Sig: TAKE 1/2 TO 1 TABLET(5 TO 10 MG) BY MOUTH THREE TIMES DAILY AS NEEDED FOR MUSCLE SPASMS     Analgesics:  Muscle Relaxants - baclofen Failed - 08/15/2022  5:45 PM      Failed - Cr in normal range and within 180 days    Creat  Date Value Ref Range Status  10/29/2016 0.64 0.50 - 1.10 mg/dL Final         Failed - eGFR is 30 or above and within 180 days    GFR, Est African American  Date Value Ref Range Status  10/29/2016 >89 >=60 mL/min Final   GFR, Est Non African American  Date Value Ref Range Status  10/29/2016 >89 >=60 mL/min Final         Failed - Valid encounter within last 6 months    Recent Outpatient Visits           2 years ago COPD with acute exacerbation [facility])   [place], Alexander J, DO   3 years ago COPD with acute exacerbation [facility])   [place], DO   3 years ago Suspected Covid-19 Virus Infection   [facility] [place], Devonne Doughty, DO   4 years ago Acute non-recurrent maxillary sinusitis   [facility] [place], Devonne Doughty, DO   4 years ago Acute left-sided thoracic back pain   [facility] [place], Devonne Doughty, DO               benzonatate (TESSALON) 200 MG capsule [Pharmacy Med Name: BENZONATATE '200MG'$  CAPSULES] 60 capsule 2    Sig: TAKE 1 CAPSULE(200 MG) BY MOUTH THREE TIMES DAILY AS NEEDED FOR COUGH     Ear, Nose, and Throat:  Antitussives/Expectorants Failed - 08/15/2022  5:45 PM       Failed - Valid encounter within last 12 months    Recent Outpatient Visits           2 years ago COPD with acute exacerbation [facility])   [place], DO   3 years ago COPD with acute exacerbation [facility])   [place], DO   3 years ago Suspected Covid-19 Virus Infection   [place], DO   4 years ago Acute non-recurrent maxillary sinusitis   [place], DO   4 years ago Acute left-sided thoracic back pain   [place], [place]

## 2022-08-20 ENCOUNTER — Encounter: Payer: Self-pay | Admitting: Family Medicine

## 2022-08-20 ENCOUNTER — Ambulatory Visit: Payer: Medicaid Other | Admitting: Family Medicine

## 2022-08-20 VITALS — BP 106/80 | HR 100 | Ht 61.0 in | Wt 102.6 lb

## 2022-08-20 DIAGNOSIS — Z7689 Persons encountering health services in other specified circumstances: Secondary | ICD-10-CM

## 2022-08-20 DIAGNOSIS — M7712 Lateral epicondylitis, left elbow: Secondary | ICD-10-CM

## 2022-08-20 DIAGNOSIS — R053 Chronic cough: Secondary | ICD-10-CM | POA: Diagnosis not present

## 2022-08-20 DIAGNOSIS — J4489 Other specified chronic obstructive pulmonary disease: Secondary | ICD-10-CM

## 2022-08-20 DIAGNOSIS — M25551 Pain in right hip: Secondary | ICD-10-CM | POA: Diagnosis not present

## 2022-08-20 DIAGNOSIS — G8929 Other chronic pain: Secondary | ICD-10-CM | POA: Diagnosis not present

## 2022-08-20 DIAGNOSIS — J8410 Pulmonary fibrosis, unspecified: Secondary | ICD-10-CM | POA: Diagnosis not present

## 2022-08-20 MED ORDER — NAPROXEN 500 MG PO TABS: 500.0000 mg | ORAL_TABLET | Freq: Two times a day (BID) | ORAL | 2 refills | Status: DC

## 2022-08-20 MED ORDER — BENZONATATE 200 MG PO CAPS: 200.0000 mg | ORAL_CAPSULE | Freq: Three times a day (TID) | ORAL | 3 refills | Status: DC | PRN

## 2022-08-20 MED ORDER — GABAPENTIN 300 MG PO CAPS: 300.0000 mg | ORAL_CAPSULE | Freq: Every day | ORAL | 1 refills | Status: DC

## 2022-08-20 NOTE — Patient Instructions (Addendum)
Thank you for coming to the office today.  I recommend return to Eye Dr at [facility] for blurry vision  Ordered all meds  Tessalon Perls  Recommend trial of Anti-inflammatory with Naproxen (Naprosyn) '500mg'$  tabs - take one with food and plenty of water TWICE daily every day (breakfast and dinner), for next 2 to 4 weeks, then you may take only as needed - DO NOT TAKE any ibuprofen, aleve, motrin while you are taking this medicine - It is safe to take Tylenol Ext Str '500mg'$  tabs - take 1 to 2 (max dose '1000mg'$ ) every 6 hours as needed for breakthrough pain, max 24 hour daily dose is 6 to 8 tablets or '4000mg'$   For the hip, likely arthritis component Recommend X-ray in future.  Refilled Gabapentin for nerve impingement in elbow May need future injection again or other treatment option.   Please schedule a Follow-up Appointment to: Return in about 3 months (around 11/18/2022) for 3 month Annual Physical AM apt fasting lab after.  If you have any other questions or concerns, please feel free to call the office or send a message through [facility]. You may also schedule an earlier appointment if necessary.  Additionally, you may be receiving a survey about your experience at our office within a few days to 1 week by e-mail or mail. We value your feedback.  Nobie Putnam, DO [place]

## 2022-08-20 NOTE — Progress Notes (Signed)
Subjective:    Patient ID: Joy Ball, female    DOB: [birth year], 57 y.o.   MRN: [id number]  Joy Ball is a 57 y.o. female presenting on 08/20/2022 for Establish Care   HPI  Asthma-COPD Overlap History of Pulm Fibrosis Active Smoker Not followed by Pulmonology Uses Flovent. Albuterol AS NEEDED Waiting on new medicaid card, asking about inhalers Difficulty sleeping at night, she has de-humidifier If wake up with coughing, will have difficulty falling back to sleep Often uses inhaler / nebulizer to try to help fall back asleep Need Tessalon perls re order  Lateral Epicondylitis / Left Elbow 10/2021 Dr Marvetta Gibbons PA at [facility] Treated with Gabapentin with some relief Injection in elbow with improvement  Right Hip Pain >1 year, can be episodic flares, feels like will give way  Joint pain with arthritis Takes OTC PRN      06/21/2019   11:04 AM 02/10/2019   11:32 AM 12/15/2017    1:32 PM  Depression screen PHQ 2/9  Decreased Interest 0 0 0  Down, Depressed, Hopeless 0 0 0  PHQ - 2 Score 0 0 0    Past Medical History:  Diagnosis Date   Asthma    COPD (chronic obstructive pulmonary disease) ([place])    Emphysema lung ([place])    GERD (gastroesophageal reflux disease)    Migraines    Seasonal allergies    Past Surgical History:  Procedure Laterality Date   CESAREAN SECTION     TUBAL LIGATION     Social History   Socioeconomic History   Marital status: Widowed    Spouse name: Not on file   Number of children: 3   Years of education: Not on file   Highest education level: Not on file  Occupational History   Occupation: Microbiologist houses    Employer: OTHER  Tobacco Use   Smoking status: Every Day    Packs/day: 1.50    Years: 30.00    Total pack years: 45.00    Types: Cigarettes   Smokeless tobacco: Current  Substance and Sexual Activity   Alcohol use: Yes    Alcohol/week: 0.0 standard drinks of alcohol    Comment: occ   Drug use: No   Sexual  activity: Not on file  Other Topics Concern   Not on file  Social History Narrative   Not on file   Social Determinants of Health   Financial Resource Strain: Not on file  Food Insecurity: Not on file  Transportation Needs: Not on file  Physical Activity: Not on file  Stress: Not on file  Social Connections: Not on file  Intimate Partner Violence: Not on file   Family History  Problem Relation Age of Onset   Lung cancer Father        was a smoker   Heart disease Maternal Grandfather    Emphysema Mother    Current Outpatient Medications on File Prior to Visit  Medication Sig   acetaminophen (TYLENOL) 500 MG tablet Take 1,500 mg by mouth 3 (three) times daily. 2 to 3 times a day.   albuterol (VENTOLIN HFA) 108 (90 Base) MCG/ACT inhaler Inhale 2 puffs into the lungs every 6 (six) hours as needed for wheezing or shortness of breath.   baclofen (LIORESAL) 10 MG tablet TAKE 1/2 TO 1 TABLET(5 TO 10 MG) BY MOUTH THREE TIMES DAILY AS NEEDED FOR MUSCLE SPASMS   fluticasone (FLONASE) 50 MCG/ACT nasal spray Place 2 sprays into both nostrils daily.  fluticasone (FLOVENT HFA) 220 MCG/ACT inhaler Inhale 1 puff into the lungs in the morning and at bedtime.   loratadine (CLARITIN) 10 MG tablet Take 10 mg by mouth daily.   omeprazole (PRILOSEC) 20 MG capsule Take 20 mg by mouth daily.   No current facility-administered medications on file prior to visit.    Review of Systems Per HPI unless specifically indicated above      Objective:    BP 106/80   Pulse 100   Ht '5\' 1"'$  (1.549 m)   Wt 102 lb 9.6 oz (46.5 kg)   SpO2 98%   BMI 19.39 kg/m   Wt Readings from Last 3 Encounters:  08/20/22 102 lb 9.6 oz (46.5 kg)  07/18/20 98 lb (44.5 kg)  12/15/17 91 lb (41.3 kg)    Physical Exam Vitals and nursing note reviewed.  Constitutional:      General: She is not in acute distress.    Appearance: She is well-developed. She is not diaphoretic.     Comments: Well-appearing, comfortable,  cooperative  HENT:     Head: Normocephalic and atraumatic.  Eyes:     General:        Right eye: No discharge.        Left eye: No discharge.     Extraocular Movements: Extraocular movements intact.     Conjunctiva/sclera: Conjunctivae normal.     Pupils: Pupils are equal, round, and reactive to light.     Comments: Fundoscopic exam limited due to eye not dilated but visualized some increased opacity with suspected cataracts R>L  Neck:     Thyroid: No thyromegaly.  Cardiovascular:     Rate and Rhythm: Normal rate and regular rhythm.     Heart sounds: Normal heart sounds. No murmur heard. Pulmonary:     Effort: Pulmonary effort is normal. No respiratory distress.     Breath sounds: Wheezing present. No rales.  Musculoskeletal:        General: Normal range of motion.     Cervical back: Normal range of motion and neck supple.  Lymphadenopathy:     Cervical: No cervical adenopathy.  Skin:    General: Skin is warm and dry.     Findings: No erythema or rash.  Neurological:     Mental Status: She is alert and oriented to person, place, and time.  Psychiatric:        Behavior: Behavior normal.     Comments: Well groomed, good eye contact, normal speech and thoughts    Results for orders placed or performed in visit on 07/19/20  Novel Coronavirus, NAA ([facility])   Specimen: Nasopharyngeal(NP) swabs in vial transport medium   Nasopharynge  Result Value Ref Range   SARS-CoV-2, NAA Not Detected Not Detected  SARS-COV-2, NAA 2 DAY TAT   Nasopharynge  Result Value Ref Range   SARS-CoV-2, NAA 2 DAY TAT Performed       Assessment & Plan:   Problem List Items Addressed This Visit     Asthma with COPD - Primary   Relevant Medications   benzonatate (TESSALON) 200 MG capsule   Pulmonary fibrosis ([place])   Relevant Medications   benzonatate (TESSALON) 200 MG capsule   Other Visit Diagnoses     Encounter to establish care with new doctor       Chronic cough       Relevant  Medications   benzonatate (TESSALON) 200 MG capsule   Left lateral epicondylitis       Relevant Medications  gabapentin (NEURONTIN) 300 MG capsule   naproxen (NAPROSYN) 500 MG tablet   Chronic right hip pain       Relevant Medications   gabapentin (NEURONTIN) 300 MG capsule   naproxen (NAPROSYN) 500 MG tablet       Blurry vision Possible early cataract I recommend return to Eye Dr at [facility] for blurry vision  Ordered all meds  Asthma-COPD Re order Lewayne Bunting daily AS NEEDED use   For the hip, likely arthritis component Recommend X-ray in future. NSAID Naproxen AS NEEDED only, counseled on risks  Refilled Gabapentin for nerve impingement in elbow  May need future injection again or other treatment option.  Future once medicaid ins active, we can refer to Pulmonology for further management and also may warrant Neurology vs Orthopedics. Return to [facility]  Meds ordered this encounter  Medications   benzonatate (TESSALON) 200 MG capsule    Sig: Take 1 capsule (200 mg total) by mouth 3 (three) times daily as needed for cough.    Dispense:  60 capsule    Refill:  3   gabapentin (NEURONTIN) 300 MG capsule    Sig: Take 1 capsule (300 mg total) by mouth at bedtime.    Dispense:  90 capsule    Refill:  1   naproxen (NAPROSYN) 500 MG tablet    Sig: Take 1 tablet (500 mg total) by mouth 2 (two) times daily with a meal. For 2-4 weeks then as needed    Dispense:  60 tablet    Refill:  2      Follow up plan: Return in about 3 months (around 11/18/2022) for 3 month Annual Physical AM apt fasting lab after.  Nobie Putnam, [facility] 08/20/2022, 10:40 AM

## 2022-08-22 DIAGNOSIS — Z419 Encounter for procedure for purposes other than remedying health state, unspecified: Secondary | ICD-10-CM | POA: Diagnosis not present

## 2022-09-22 DIAGNOSIS — Z419 Encounter for procedure for purposes other than remedying health state, unspecified: Secondary | ICD-10-CM | POA: Diagnosis not present

## 2022-10-22 DIAGNOSIS — Z419 Encounter for procedure for purposes other than remedying health state, unspecified: Secondary | ICD-10-CM | POA: Diagnosis not present

## 2022-11-04 ENCOUNTER — Other Ambulatory Visit: Payer: Self-pay | Admitting: Nurse Practitioner

## 2022-11-04 ENCOUNTER — Telehealth: Payer: BLUE CROSS/BLUE SHIELD | Admitting: Nurse Practitioner

## 2022-11-04 DIAGNOSIS — J4541 Moderate persistent asthma with (acute) exacerbation: Secondary | ICD-10-CM

## 2022-11-04 MED ORDER — PREDNISONE 20 MG PO TABS: 20.0000 mg | ORAL_TABLET | Freq: Two times a day (BID) | ORAL | 0 refills | Status: AC

## 2022-11-04 NOTE — Progress Notes (Signed)
E-Visit for Asthma  Based on what you have shared with me, it looks like you may have a flare up of your asthma.  Asthma is a chronic (ongoing) lung disease which results in airway obstruction, inflammation and hyper-responsiveness.   Please follow up with your primary care provider in the next week. It appears you were waiting on a pulmonology referral and we recommend that due to frequent exacerbations of your asthma.   Asthma symptoms vary from person to person, with common symptoms including nighttime awakening and decreased ability to participate in normal activities as a result of shortness of breath. It is often triggered by changes in weather, changes in the season, changes in air temperature, or inside (home, school, daycare or work) allergens such as animal dander, mold, mildew, woodstoves or cockroaches.   It can also be triggered by hormonal changes, extreme emotion, physical exertion or an upper respiratory tract illness.     It is important to identify the trigger, and then eliminate or avoid the trigger if possible.   If you have been prescribed medications to be taken on a regular basis, it is important to follow the asthma action plan and to follow guidelines to adjust medication in response to increasing symptoms of decreased peak expiratory flow rate  Treatment: I have prescribed: Prednisone 40mg  by mouth per day for 5 days  HOME CARE Only take medications as instructed by your medical team. Consider wearing a mask or scarf to improve breathing air temperature have been shown to decrease irritation and decrease exacerbations Get rest. Taking a steamy shower or using a humidifier may help nasal congestion sand ease sore throat pain. You can place a towel over your head and breathe in the steam from hot water coming from a faucet. Using a saline nasal spray works much the same way.  Cough drops, hare candies and sore throat lozenges may ease your  cough.  Avoid close contacts especially the very you and the elderly Cover your mouth if you cough or sneeze Always remember to wash your hands.    GET HELP RIGHT AWAY IF: You develop worsening symptoms; breathlessness at rest, drowsy, confused or agitated, unable to speak in full sentences You have coughing fits You develop a severe headache or visual changes You develop shortness of breath, difficulty breathing or start having chest pain Your symptoms persist after you have completed your treatment plan If your symptoms do not improve within 10 days  MAKE SURE YOU Understand these instructions. Will watch your condition. Will get help right away if you are not doing well or get worse.   Your e-visit answers were reviewed by a board certified advanced clinical practitioner to complete your personal care plan, Depending upon the condition, your plan could have included both over the counter or prescription medications.   Please review your pharmacy choice. Your safety is important to [place]. If you have drug allergies check your prescription carefully.  You can use MyChart to ask questions about today's visit, request a non-urgent  call back, or ask for a work or school excuse for 24 hours related to this e-Visit. If it has been greater than 24 hours you will need to follow up with your provider, or enter a new e-Visit to address those concerns.   You will get an e-mail in the next two days asking about your experience. I hope that your e-visit has been valuable and will speed your recovery. Thank you for using e-visits.   Meds ordered  this encounter  Medications   predniSONE (DELTASONE) 20 MG tablet    Sig: Take 1 tablet (20 mg total) by mouth 2 (two) times daily with a meal for 5 days.    Dispense:  10 tablet    Refill:  0    I spent approximately 7 minutes reviewing the patient's history, current symptoms and coordinating their plan of care today.

## 2022-11-11 ENCOUNTER — Ambulatory Visit: Payer: BLUE CROSS/BLUE SHIELD | Admitting: Family Medicine

## 2022-11-11 VITALS — BP 100/62 | HR 98 | Temp 99.1°F | Ht 61.0 in | Wt 106.0 lb

## 2022-11-11 DIAGNOSIS — J441 Chronic obstructive pulmonary disease with (acute) exacerbation: Secondary | ICD-10-CM

## 2022-11-11 DIAGNOSIS — R053 Chronic cough: Secondary | ICD-10-CM

## 2022-11-11 DIAGNOSIS — J4489 Other specified chronic obstructive pulmonary disease: Secondary | ICD-10-CM | POA: Diagnosis not present

## 2022-11-11 DIAGNOSIS — J4521 Mild intermittent asthma with (acute) exacerbation: Secondary | ICD-10-CM

## 2022-11-11 DIAGNOSIS — J8410 Pulmonary fibrosis, unspecified: Secondary | ICD-10-CM

## 2022-11-11 DIAGNOSIS — B370 Candidal stomatitis: Secondary | ICD-10-CM

## 2022-11-11 MED ORDER — BENZONATATE 200 MG PO CAPS: 200.0000 mg | ORAL_CAPSULE | Freq: Three times a day (TID) | ORAL | 3 refills | Status: DC | PRN

## 2022-11-11 MED ORDER — AZITHROMYCIN 250 MG PO TABS: ORAL_TABLET | ORAL | 0 refills | Status: DC

## 2022-11-11 MED ORDER — MAGIC MOUTHWASH W/LIDOCAINE: ORAL | 0 refills | Status: DC

## 2022-11-11 MED ORDER — PREDNISONE 20 MG PO TABS: ORAL_TABLET | ORAL | 0 refills | Status: DC

## 2022-11-11 MED ORDER — ALBUTEROL SULFATE HFA 108 (90 BASE) MCG/ACT IN AERS: 2.0000 | INHALATION_SPRAY | Freq: Four times a day (QID) | RESPIRATORY_TRACT | 5 refills | Status: DC | PRN

## 2022-11-11 NOTE — Progress Notes (Unsigned)
Subjective:    Patient ID: Joy Ball, female    DOB: [birth year], 57 y.o.   MRN: [id number]  Joy Ball is a 57 y.o. female presenting on 11/11/2022 for URI and Oral Pain  Patient presents for a same day appointment.  [facility] Medicaid, she will return for physical in a week.  HPI  Last visit with me for same respiratory concerns 08/20/22, she was on tessalon perls for cough and continued inhaler therapy. She did E Visit on 11/04/22 and given 5 day prednisone 20mg  x 2 = 40mg  daily x 5 days, improvement initially then did not resolve  Today now worsening night time wheezing breathing.   ***Concern with older house and mold exposure  ***Flovent daily inhaler *** Sample Breztri  ***referral to [facility] for management, now she has [employer].   Asthma-COPD Overlap History of Pulm Fibrosis Active Smoker Not followed by Pulmonology Uses Flovent. Albuterol AS NEEDED Waiting on new medicaid card, asking about inhalers Difficulty sleeping at night, she has de-humidifier If wake up with coughing, will have difficulty falling back to sleep Often uses inhaler / nebulizer to try to help fall back asleep Need Tessalon perls re order   Admits oral thrush, burning symptoms   Health Maintenance: ***     11/11/2022    2:00 PM 06/21/2019   11:04 AM 02/10/2019   11:32 AM  Depression screen PHQ 2/9  Decreased Interest 0 0 0  Down, Depressed, Hopeless 0 0 0  PHQ - 2 Score 0 0 0    Social History   Tobacco Use   Smoking status: Every Day    Packs/day: 1.50    Years: 30.00    Additional pack years: 0.00    Total pack years: 45.00    Types: Cigarettes   Smokeless tobacco: Current  Substance Use Topics   Alcohol use: Yes    Alcohol/week: 0.0 standard drinks of alcohol    Comment: occ   Drug use: No    Review of Systems Per HPI unless specifically indicated above     Objective:    BP 100/62   Pulse 98   Temp 99.1 F (37.3 C) (Oral)    Ht 5\' 1"  (1.549 m)   Wt 106 lb (48.1 kg)   SpO2 94%   BMI 20.03 kg/m   Wt Readings from Last 3 Encounters:  11/11/22 106 lb (48.1 kg)  08/20/22 102 lb 9.6 oz (46.5 kg)  07/18/20 98 lb (44.5 kg)    Physical Exam Results for orders placed or performed in visit on 07/19/20  Novel Coronavirus, NAA ([facility])   Specimen: Nasopharyngeal(NP) swabs in vial transport medium   Nasopharynge  Result Value Ref Range   SARS-CoV-2, NAA Not Detected Not Detected  SARS-COV-2, NAA 2 DAY TAT   Nasopharynge  Result Value Ref Range   SARS-CoV-2, NAA 2 DAY TAT Performed       Assessment & Plan:   Problem List Items Addressed This Visit     Asthma with COPD - Primary   Relevant Medications   azithromycin (ZITHROMAX Z-PAK) 250 MG tablet   predniSONE (DELTASONE) 20 MG tablet   benzonatate (TESSALON) 200 MG capsule   magic mouthwash w/lidocaine SOLN   albuterol (VENTOLIN HFA) 108 (90 Base) MCG/ACT inhaler   Pulmonary fibrosis ([facility])   Relevant Medications   benzonatate (TESSALON) 200 MG capsule   Other Visit Diagnoses     Chronic cough       Relevant Medications  benzonatate (TESSALON) 200 MG capsule   COPD exacerbation ([facility])       Relevant Medications   azithromycin (ZITHROMAX Z-PAK) 250 MG tablet   predniSONE (DELTASONE) 20 MG tablet   benzonatate (TESSALON) 200 MG capsule   magic mouthwash w/lidocaine SOLN   albuterol (VENTOLIN HFA) 108 (90 Base) MCG/ACT inhaler   Oral thrush       Relevant Medications   azithromycin (ZITHROMAX Z-PAK) 250 MG tablet   magic mouthwash w/lidocaine SOLN   Mild intermittent asthma with acute exacerbation       Relevant Medications   predniSONE (DELTASONE) 20 MG tablet   albuterol (VENTOLIN HFA) 108 (90 Base) MCG/ACT inhaler       Meds ordered this encounter  Medications   azithromycin (ZITHROMAX Z-PAK) 250 MG tablet    Sig: Take 2 tabs (500mg  total) on Day 1. Take 1 tab (250mg ) daily for next 4 days.    Dispense:  6 tablet    Refill:  0    predniSONE (DELTASONE) 20 MG tablet    Sig: Take daily with food. Start with 60mg  (3 pills) x 2 days, then reduce to 40mg  (2 pills) x 2 days, then 20mg  (1 pill) x 3 days    Dispense:  13 tablet    Refill:  0   benzonatate (TESSALON) 200 MG capsule    Sig: Take 1 capsule (200 mg total) by mouth 3 (three) times daily as needed for cough.    Dispense:  60 capsule    Refill:  3   magic mouthwash w/lidocaine SOLN    Sig: Swish, gargle, and spit one to two teaspoonfuls every six hours as needed. Shake well before using.    Dispense:  120 mL    Refill:  0    1 Part viscous lidocaine 2%  1 Part Maalox  1 Part diphenhydramine 12.5 mg per 5 ml elixir 1 Part Nystatin   albuterol (VENTOLIN HFA) 108 (90 Base) MCG/ACT inhaler    Sig: Inhale 2 puffs into the lungs every 6 (six) hours as needed for wheezing or shortness of breath.    Dispense:  8 g    Refill:  5      Follow up plan: Return if symptoms worsen or fail to improve.    Saralyn Pilar, DO [facility] 11/11/2022, 2:02 PM

## 2022-11-11 NOTE — Patient Instructions (Addendum)
Thank you for coming to the office today.  Start with Prednisone taper 7 days  Stop Flovent temporarily, use Breztri 2 puff twice a day for 7 day per sample inhaler, 2 samples = 2 weeks  Start Azithromycin Z pak (antibiotic) 2 tabs day 1, then 1 tab x 4 days, complete entire course even if improved  Albuterol re order.  Tessalon Perls re order.  Magic Mouthwash, printed rx, take to pharmacy, do not swallow  Referral in to Pulmonology lung doctors. They will call to schedule  [facility] [address] [place], [place] [address] Phone: [phone]   Please schedule a Follow-up Appointment to: Return if symptoms worsen or fail to improve.  If you have any other questions or concerns, please feel free to call the office or send a message through MyChart. You may also schedule an earlier appointment if necessary.  Additionally, you may be receiving a survey about your experience at our office within a few days to 1 week by e-mail or mail. We value your feedback.  Saralyn Pilar, DO [facility], [place]

## 2022-11-12 ENCOUNTER — Encounter: Payer: Self-pay | Admitting: Family Medicine

## 2022-11-18 ENCOUNTER — Other Ambulatory Visit: Payer: Self-pay | Admitting: Family Medicine

## 2022-11-18 ENCOUNTER — Encounter: Payer: Self-pay | Admitting: Family Medicine

## 2022-11-18 DIAGNOSIS — J441 Chronic obstructive pulmonary disease with (acute) exacerbation: Secondary | ICD-10-CM

## 2022-11-18 DIAGNOSIS — G8929 Other chronic pain: Secondary | ICD-10-CM

## 2022-11-18 DIAGNOSIS — M7712 Lateral epicondylitis, left elbow: Secondary | ICD-10-CM

## 2022-11-18 MED ORDER — LEVOFLOXACIN 500 MG PO TABS: 500.0000 mg | ORAL_TABLET | Freq: Every day | ORAL | 0 refills | Status: DC

## 2022-11-19 NOTE — Telephone Encounter (Signed)
Refused Gabapentin 300 mg because it's being requested too soon.

## 2022-11-20 ENCOUNTER — Ambulatory Visit (INDEPENDENT_AMBULATORY_CARE_PROVIDER_SITE_OTHER): Payer: Medicaid Other | Admitting: Family Medicine

## 2022-11-20 ENCOUNTER — Encounter: Payer: Self-pay | Admitting: Family Medicine

## 2022-11-20 VITALS — BP 104/70 | HR 87 | Ht 61.0 in | Wt 107.0 lb

## 2022-11-20 DIAGNOSIS — Z1231 Encounter for screening mammogram for malignant neoplasm of breast: Secondary | ICD-10-CM

## 2022-11-20 DIAGNOSIS — B370 Candidal stomatitis: Secondary | ICD-10-CM | POA: Diagnosis not present

## 2022-11-20 DIAGNOSIS — J310 Chronic rhinitis: Secondary | ICD-10-CM | POA: Diagnosis not present

## 2022-11-20 DIAGNOSIS — Z1211 Encounter for screening for malignant neoplasm of colon: Secondary | ICD-10-CM | POA: Diagnosis not present

## 2022-11-20 DIAGNOSIS — J8410 Pulmonary fibrosis, unspecified: Secondary | ICD-10-CM | POA: Diagnosis not present

## 2022-11-20 DIAGNOSIS — R7309 Other abnormal glucose: Secondary | ICD-10-CM | POA: Diagnosis not present

## 2022-11-20 DIAGNOSIS — Z0000 Encounter for general adult medical examination without abnormal findings: Secondary | ICD-10-CM | POA: Diagnosis not present

## 2022-11-20 DIAGNOSIS — Z124 Encounter for screening for malignant neoplasm of cervix: Secondary | ICD-10-CM | POA: Diagnosis not present

## 2022-11-20 MED ORDER — FLUCONAZOLE 150 MG PO TABS: 150.0000 mg | ORAL_TABLET | Freq: Every day | ORAL | 0 refills | Status: DC

## 2022-11-20 MED ORDER — IPRATROPIUM BROMIDE 0.06 % NA SOLN: 2.0000 | Freq: Four times a day (QID) | NASAL | 2 refills | Status: DC

## 2022-11-20 NOTE — Progress Notes (Signed)
Subjective:    Patient ID: Joy Ball, female    DOB: 07-Oct-1965, 57 y.o.   MRN: [id number]  Joy Ball is a 57 y.o. female presenting on 11/20/2022 for Annual Exam   HPI  Here for Annual Physical and Lab Orders  Asthma-COPD Overlap History of Pulm Fibrosis Active Smoker Not followed by Pulmonology Uses Flovent. Albuterol AS NEEDED Waiting on new medicaid card, asking about inhalers Difficulty sleeping at night, she has de-humidifier If wake up with coughing, will have difficulty falling back to sleep Often uses inhaler / nebulizer to try to help fall back asleep Need Tessalon perls re order  Upcoming apt with Dr Tommie Ard Pulmonology She can pursue LDCT screening through their office at that time.  Still has oral thrush concern. Tried Nystatin oral mouthwash.  Health Maintenance:  Due for cervical cancer screening, no prior pap smear lately, requesting referral to GYN  Due for mammogram, no prior screening.  Due for colon CA screening - no prior testing. Interested in Cologuard.      11/11/2022    2:00 PM 06/21/2019   11:04 AM 02/10/2019   11:32 AM  Depression screen PHQ 2/9  Decreased Interest 0 0 0  Down, Depressed, Hopeless 0 0 0  PHQ - 2 Score 0 0 0    Past Medical History:  Diagnosis Date   Asthma    COPD (chronic obstructive pulmonary disease) (HCC)    Emphysema lung (HCC)    GERD (gastroesophageal reflux disease)    Migraines    Seasonal allergies    Past Surgical History:  Procedure Laterality Date   CESAREAN SECTION     TUBAL LIGATION     Social History   Socioeconomic History   Marital status: Widowed    Spouse name: Not on file   Number of children: 3   Years of education: Not on file   Highest education level: Some college, no degree  Occupational History   Occupation: Nutritional therapist: OTHER  Tobacco Use   Smoking status: Every Day    Packs/day: 1.50    Years: 30.00    Additional pack years: 0.00    Total  pack years: 45.00    Types: Cigarettes   Smokeless tobacco: Current  Substance and Sexual Activity   Alcohol use: Yes    Alcohol/week: 0.0 standard drinks of alcohol    Comment: occ   Drug use: No   Sexual activity: Not on file  Other Topics Concern   Not on file  Social History Narrative   Not on file   Social Determinants of Health   Financial Resource Strain: Low Risk  (11/11/2022)   Overall Financial Resource Strain (CARDIA)    Difficulty of Paying Living Expenses: Not very hard  Food Insecurity: Food Insecurity Present (11/11/2022)   Hunger Vital Sign    Worried About Running Out of Food in the Last Year: Sometimes true    Ran Out of Food in the Last Year: Sometimes true  Transportation Needs: No Transportation Needs (11/11/2022)   PRAPARE - Administrator, Civil Service (Medical): No    Lack of Transportation (Non-Medical): No  Physical Activity: Unknown (11/11/2022)   Exercise Vital Sign    Days of Exercise per Week: 0 days    Minutes of Exercise per Session: Not on file  Stress: No Stress Concern Present (11/11/2022)   [employer] of Occupational Health - Occupational Stress Questionnaire    Feeling of Stress :  Only a little  Social Connections: Moderately Isolated (11/11/2022)   Social Connection and Isolation Panel [NHANES]    Frequency of Communication with Friends and Family: More than three times a week    Frequency of Social Gatherings with Friends and Family: Once a week    Attends Religious Services: Never    Database administrator or Organizations: No    Attends Engineer, structural: Not on file    Marital Status: Living with partner  Intimate Partner Violence: Not on file   Family History  Problem Relation Age of Onset   Lung cancer Father        was a smoker   Heart disease Maternal Grandfather    Emphysema Mother    Current Outpatient Medications on File Prior to Visit  Medication Sig   acetaminophen (TYLENOL) 500 MG tablet  Take 1,500 mg by mouth 3 (three) times daily. 2 to 3 times a day.   albuterol (VENTOLIN HFA) 108 (90 Base) MCG/ACT inhaler Inhale 2 puffs into the lungs every 6 (six) hours as needed for wheezing or shortness of breath.   baclofen (LIORESAL) 10 MG tablet TAKE 1/2 TO 1 TABLET(5 TO 10 MG) BY MOUTH THREE TIMES DAILY AS NEEDED FOR MUSCLE SPASMS   benzonatate (TESSALON) 200 MG capsule Take 1 capsule (200 mg total) by mouth 3 (three) times daily as needed for cough.   fluticasone (FLONASE) 50 MCG/ACT nasal spray Place 2 sprays into both nostrils daily.   fluticasone (FLOVENT HFA) 220 MCG/ACT inhaler Inhale 1 puff into the lungs in the morning and at bedtime.   gabapentin (NEURONTIN) 300 MG capsule Take 1 capsule (300 mg total) by mouth at bedtime.   levofloxacin (LEVAQUIN) 500 MG tablet Take 1 tablet (500 mg total) by mouth daily. For 7 days   loratadine (CLARITIN) 10 MG tablet Take 10 mg by mouth daily.   naproxen (NAPROSYN) 500 MG tablet Take 1 tablet (500 mg total) by mouth 2 (two) times daily with a meal. For 2-4 weeks then as needed   omeprazole (PRILOSEC) 20 MG capsule Take 20 mg by mouth daily.   No current facility-administered medications on file prior to visit.    Review of Systems  Constitutional:  Negative for activity change, appetite change, chills, diaphoresis, fatigue and fever.  HENT:  Negative for congestion and hearing loss.   Eyes:  Negative for visual disturbance.  Respiratory:  Negative for cough, chest tightness, shortness of breath and wheezing.   Cardiovascular:  Negative for chest pain, palpitations and leg swelling.  Gastrointestinal:  Negative for abdominal pain, constipation, diarrhea, nausea and vomiting.  Genitourinary:  Negative for dysuria, frequency and hematuria.  Musculoskeletal:  Negative for arthralgias and neck pain.  Skin:  Negative for rash.  Neurological:  Negative for dizziness, weakness, light-headedness, numbness and headaches.  Hematological:   Negative for adenopathy.  Psychiatric/Behavioral:  Negative for behavioral problems, dysphoric mood and sleep disturbance.    Per HPI unless specifically indicated above     Objective:    BP 104/70   Pulse 87   Ht 5\' 1"  (1.549 m)   Wt 107 lb (48.5 kg)   SpO2 97%   BMI 20.22 kg/m   Wt Readings from Last 3 Encounters:  11/20/22 107 lb (48.5 kg)  11/11/22 106 lb (48.1 kg)  08/20/22 102 lb 9.6 oz (46.5 kg)    Physical Exam Vitals and nursing note reviewed.  Constitutional:      General: She is not in acute  distress.    Appearance: She is well-developed. She is not diaphoretic.     Comments: Well-appearing, comfortable, cooperative  HENT:     Head: Normocephalic and atraumatic.     Nose: Congestion present.  Eyes:     General:        Right eye: No discharge.        Left eye: No discharge.     Conjunctiva/sclera: Conjunctivae normal.     Pupils: Pupils are equal, round, and reactive to light.  Neck:     Thyroid: No thyromegaly.  Cardiovascular:     Rate and Rhythm: Normal rate and regular rhythm.     Pulses: Normal pulses.     Heart sounds: Normal heart sounds. No murmur heard. Pulmonary:     Effort: Pulmonary effort is normal. No respiratory distress.     Breath sounds: Normal breath sounds. No wheezing or rales.     Comments: Some mild reduced air movement Abdominal:     General: Bowel sounds are normal. There is no distension.     Palpations: Abdomen is soft. There is no mass.     Tenderness: There is no abdominal tenderness.  Musculoskeletal:        General: No tenderness. Normal range of motion.     Cervical back: Normal range of motion and neck supple.     Comments: Upper / Lower Extremities: - Normal muscle tone, strength bilateral upper extremities 5/5, lower extremities 5/5  Lymphadenopathy:     Cervical: No cervical adenopathy.  Skin:    General: Skin is warm and dry.     Findings: No erythema or rash.  Neurological:     Mental Status: She is alert and  oriented to person, place, and time.     Comments: Distal sensation intact to light touch all extremities  Psychiatric:        Mood and Affect: Mood normal.        Behavior: Behavior normal.        Thought Content: Thought content normal.     Comments: Well groomed, good eye contact, normal speech and thoughts    Results for orders placed or performed in visit on 07/19/20  Novel Coronavirus, NAA ([facility])   Specimen: Nasopharyngeal(NP) swabs in vial transport medium   Nasopharynge  Result Value Ref Range   SARS-CoV-2, NAA Not Detected Not Detected  SARS-COV-2, NAA 2 DAY TAT   Nasopharynge  Result Value Ref Range   SARS-CoV-2, NAA 2 DAY TAT Performed       Assessment & Plan:   Problem List Items Addressed This Visit     Pulmonary fibrosis (HCC)   Other Visit Diagnoses     Annual physical exam    -  Primary   Relevant Orders   COMPLETE METABOLIC PANEL WITH GFR   CBC with Differential/Platelet   Hemoglobin A1c   Lipid panel   TSH   Encounter for screening mammogram for malignant neoplasm of breast       Relevant Orders   MM 3D SCREENING MAMMOGRAM BILATERAL BREAST   Cervical cancer screening       Relevant Orders   Ambulatory referral to Obstetrics / Gynecology   Colon cancer screening       Relevant Orders   Cologuard   Abnormal glucose       Relevant Orders   Hemoglobin A1c   Chronic rhinitis       Relevant Medications   ipratropium (ATROVENT) 0.06 % nasal spray   Oral thrush  Relevant Medications   fluconazole (DIFLUCAN) 150 MG tablet       Updated Health Maintenance information Fasting labs ordered today, pending results Encouraged improvement to lifestyle with diet and exercise  Pulmonary Fibrosis / Emphysema Keep upcoming apt with Pulmonology 12/11/22  Oral Thrush - residual issue Will order oral diflucan course, she has already tried nystatin mouthwash  Discussion on cancer screening Ordered initial Mammogram - [place], she will call to  schedule, per AVS  Due for routine colon cancer screening. Never had colonoscopy (not interested), no family history colon cancer. - Discussion today about recommendations for either Colonoscopy or Cologuard screening, benefits and risks of screening, interested in Cologuard, understands that if positive then recommendation is for diagnostic colonoscopy to follow-up. - Ordered Cologuard today  - Patient advised to contact insurance first to learn cost   Orders Placed This Encounter  Procedures   MM 3D SCREENING MAMMOGRAM BILATERAL BREAST    Standing Status:   Future    Standing Expiration Date:   11/20/2023    Order Specific Question:   Reason for Exam (SYMPTOM  OR DIAGNOSIS REQUIRED)    Answer:   Screening bilateral 3D Mammogram [place]    Order Specific Question:   Preferred imaging location?    Answer:   [facility] [place]   Cologuard   COMPLETE METABOLIC PANEL WITH GFR   CBC with Differential/Platelet   Hemoglobin A1c   Lipid panel    Order Specific Question:   Has the patient fasted?    Answer:   Yes   TSH   Ambulatory referral to Obstetrics / Gynecology    Referral Priority:   Routine    Referral Type:   Consultation    Referral Reason:   Specialty Services Required    Requested Specialty:   Obstetrics and Gynecology    Number of Visits Requested:   1     Meds ordered this encounter  Medications   ipratropium (ATROVENT) 0.06 % nasal spray    Sig: Place 2 sprays into both nostrils 4 (four) times daily.    Dispense:  15 mL    Refill:  2   fluconazole (DIFLUCAN) 150 MG tablet    Sig: Take 1 tablet (150 mg total) by mouth daily. For 1 week    Dispense:  7 tablet    Refill:  0      Follow up plan: Return in about 4 months (around 03/23/2023) for 4 month follow-up Pulm updates.  Saralyn Pilar, DO [facility] 11/20/2022, 9:12 AM

## 2022-11-20 NOTE — Patient Instructions (Addendum)
Thank you for coming to the office today.  Trial on Diflucan for oral thrush for daily pill for 1 week  Start Atrovent nasal spray decongestant 2 sprays in each nostril up to 4 times daily for 7 days For congestion  Keep up with Pulmonology lung doctors 12/11/22  ----------  For Mammogram screening for breast cancer   Call the Imaging Center below anytime to schedule your own appointment now that order has been placed.  [facility] [address] [place], [place] [address] Phone: [phone]  --------------  Colon Cancer Screening: - For all adults age 42+ routine colon cancer screening is highly recommended.     - Recent guidelines from American Cancer Society recommend starting age of 91 - Early detection of colon cancer is important, because often there are no warning signs or symptoms, also if found early usually it can be cured. Late stage is hard to treat.  - If you are not interested in Colonoscopy screening (if done and normal you could be cleared for 5 to 10 years until next due), then Cologuard is an excellent alternative for screening test for Colon Cancer. It is highly sensitive for detecting DNA of colon cancer from even the earliest stages. Also, there is NO bowel prep required. - If Cologuard is NEGATIVE, then it is good for 3 years before next due - If Cologuard is POSITIVE, then it is strongly advised to get a Colonoscopy, which allows the GI doctor to locate the source of the cancer or polyp (even very early stage) and treat it by removing it. ------------------------- If you would like to proceed with Cologuard (stool DNA test) - FIRST, call your insurance company and tell them you want to check cost of Cologuard tell them CPT Code 91478 (it may be completely covered and you could get for no cost, OR max cost without any coverage is about $600). Also, keep in mind if you do NOT open the kit, and decide not to do the test, you will NOT be charged, you  should contact the company if you decide not to do the test. - If you want to proceed, you can notify us (phone message, MyChart Message, or at next visit) and we will order it for you. The test kit will be delivered to you house within about 1 week. Follow instructions to collect sample, you may call the company for any help or questions, 24/7 telephone support at [phone].   Ordered the Cologuard (home kit) test for colon cancer screening. Stay tuned for further updates.  It will be shipped to you directly. If not received in 2-4 weeks, call us or the company.   If you send it back and no results are received in 2-4 weeks, call us or the company as well!   Colon Cancer Screening: - For all adults age 32+ routine colon cancer screening is highly recommended.     - Recent guidelines from American Cancer Society recommend starting age of 60 - Early detection of colon cancer is important, because often there are no warning signs or symptoms, also if found early usually it can be cured. Late stage is hard to treat.   - If Cologuard is NEGATIVE, then it is good for 3 years before next due - If Cologuard is POSITIVE, then it is strongly advised to get a Colonoscopy, which allows the GI doctor to locate the source of the cancer or polyp (even very early stage) and treat it by removing it. -------------------------  Follow instructions to collect sample, you may call the company for any help or questions, 24/7 telephone support at [phone].    Please schedule a Follow-up Appointment to: Return in about 4 months (around 03/23/2023) for 4 month follow-up Pulm updates.  If you have any other questions or concerns, please feel free to call the office or send a message through MyChart. You may also schedule an earlier appointment if necessary.  Additionally, you may be receiving a survey about your experience at our office within a few days to 1 week by e-mail or mail. We value your  feedback.  Saralyn Pilar, DO [facility], [place]

## 2022-11-21 LAB — COMPLETE METABOLIC PANEL WITH GFR
AG Ratio: 2 (calc) (ref 1.0–2.5)
ALT: 19 U/L (ref 6–29)
AST: 28 U/L (ref 10–35)
Albumin: 4.2 g/dL (ref 3.6–5.1)
Alkaline phosphatase (APISO): 120 U/L (ref 37–153)
BUN: 20 mg/dL (ref 7–25)
CO2: 22 mmol/L (ref 20–32)
Calcium: 9.4 mg/dL (ref 8.6–10.4)
Chloride: 105 mmol/L (ref 98–110)
Creat: 0.76 mg/dL (ref 0.50–1.03)
Globulin: 2.1 g/dL (calc) (ref 1.9–3.7)
Glucose, Bld: 81 mg/dL (ref 65–99)
Potassium: 3.1 mmol/L — ABNORMAL LOW (ref 3.5–5.3)
Sodium: 138 mmol/L (ref 135–146)
Total Bilirubin: 0.5 mg/dL (ref 0.2–1.2)
Total Protein: 6.3 g/dL (ref 6.1–8.1)
eGFR: 92 mL/min/{1.73_m2} (ref >=60)

## 2022-11-21 LAB — CBC WITH DIFFERENTIAL/PLATELET
Absolute Monocytes: 756 cells/uL (ref 200–950)
Basophils Absolute: 73 cells/uL (ref 0–200)
Basophils Relative: 0.6 %
Eosinophils Absolute: 85 cells/uL (ref 15–500)
Eosinophils Relative: 0.7 %
HCT: 38.9 % (ref 35.0–45.0)
Hemoglobin: 13.8 g/dL (ref 11.7–15.5)
Lymphs Abs: 4148 cells/uL — ABNORMAL HIGH (ref 850–3900)
MCH: 36.6 pg — ABNORMAL HIGH (ref 27.0–33.0)
MCHC: 35.5 g/dL (ref 32.0–36.0)
MCV: 103.2 fL — ABNORMAL HIGH (ref 80.0–100.0)
MPV: 8.8 fL (ref 7.5–12.5)
Monocytes Relative: 6.2 %
Neutro Abs: 7137 cells/uL (ref 1500–7800)
Neutrophils Relative %: 58.5 %
Platelets: 413 10*3/uL — ABNORMAL HIGH (ref 140–400)
RBC: 3.77 10*6/uL — ABNORMAL LOW (ref 3.80–5.10)
RDW: 13 % (ref 11.0–15.0)
Total Lymphocyte: 34 %
WBC: 12.2 10*3/uL — ABNORMAL HIGH (ref 3.8–10.8)

## 2022-11-21 LAB — LIPID PANEL
Cholesterol: 274 mg/dL — ABNORMAL HIGH (ref <200)
HDL: 191 mg/dL (ref >=50)
LDL Cholesterol (Calc): 63 mg/dL (calc)
Non-HDL Cholesterol (Calc): 83 mg/dL (calc) (ref <130)
Total CHOL/HDL Ratio: 1.4 (calc) (ref <5.0)
Triglycerides: 117 mg/dL (ref <150)

## 2022-11-21 LAB — HEMOGLOBIN A1C
Hgb A1c MFr Bld: 5 % of total Hgb (ref <5.7)
Mean Plasma Glucose: 97 mg/dL
eAG (mmol/L): 5.4 mmol/L

## 2022-11-21 LAB — TSH: TSH: 1.99 mIU/L

## 2022-11-22 DIAGNOSIS — Z419 Encounter for procedure for purposes other than remedying health state, unspecified: Secondary | ICD-10-CM | POA: Diagnosis not present

## 2022-11-24 ENCOUNTER — Encounter: Payer: Self-pay | Admitting: Family Medicine

## 2022-11-24 DIAGNOSIS — J4489 Other specified chronic obstructive pulmonary disease: Secondary | ICD-10-CM

## 2022-11-24 DIAGNOSIS — E876 Hypokalemia: Secondary | ICD-10-CM

## 2022-11-27 ENCOUNTER — Telehealth: Payer: Self-pay

## 2022-11-27 NOTE — Addendum Note (Signed)
Addended by: Smitty Cords on: 11/27/2022 02:11 PM   Modules accepted: Orders

## 2022-11-27 NOTE — Progress Notes (Unsigned)
Called and spoke with the patient. Appt scheduled for Friday, 11/28/22.

## 2022-11-27 NOTE — Telephone Encounter (Signed)
Copied from CRM [id number]. Topic: Appointment Scheduling - Scheduling Inquiry for Clinic >> Nov 27, 2022  3:21 PM Alfred Levins wrote: Dr would like her labs before June 10th, when I tried to schedule it went out past June 10th Please advise

## 2022-11-28 ENCOUNTER — Other Ambulatory Visit: Payer: BLUE CROSS/BLUE SHIELD

## 2022-11-28 DIAGNOSIS — E876 Hypokalemia: Secondary | ICD-10-CM

## 2022-11-29 LAB — BASIC METABOLIC PANEL WITH GFR
BUN: 8 mg/dL (ref 7–25)
CO2: 20 mmol/L (ref 20–32)
Calcium: 8.7 mg/dL (ref 8.6–10.4)
Chloride: 108 mmol/L (ref 98–110)
Creat: 0.75 mg/dL (ref 0.50–1.03)
Glucose, Bld: 135 mg/dL — ABNORMAL HIGH (ref 65–99)
Potassium: 3.3 mmol/L — ABNORMAL LOW (ref 3.5–5.3)
Sodium: 139 mmol/L (ref 135–146)
eGFR: 94 mL/min/{1.73_m2} (ref >=60)

## 2022-11-29 LAB — MAGNESIUM: Magnesium: 1.8 mg/dL (ref 1.5–2.5)

## 2022-12-01 ENCOUNTER — Encounter: Payer: Self-pay | Admitting: Family Medicine

## 2022-12-01 ENCOUNTER — Other Ambulatory Visit: Payer: Self-pay | Admitting: Family Medicine

## 2022-12-01 DIAGNOSIS — E876 Hypokalemia: Secondary | ICD-10-CM

## 2022-12-01 MED ORDER — POTASSIUM CHLORIDE CRYS ER 20 MEQ PO TBCR
20.0000 meq | EXTENDED_RELEASE_TABLET | Freq: Every day | ORAL | 0 refills | Status: DC
Start: 2022-12-01 — End: 2024-04-07

## 2022-12-02 ENCOUNTER — Other Ambulatory Visit: Payer: Self-pay | Admitting: Family Medicine

## 2022-12-02 DIAGNOSIS — M7712 Lateral epicondylitis, left elbow: Secondary | ICD-10-CM

## 2022-12-02 DIAGNOSIS — G8929 Other chronic pain: Secondary | ICD-10-CM

## 2022-12-03 NOTE — Telephone Encounter (Signed)
Requested Prescriptions  Pending Prescriptions Disp Refills   naproxen (NAPROSYN) 500 MG tablet [Pharmacy Med Name: NAPROXEN 500MG  TABLETS] 180 tablet 0    Sig: TAKE 1 TABLET(500 MG) BY MOUTH TWICE DAILY WITH A MEAL FOR 2 TO 4 WEEKS THEN AS NEEDED     Analgesics:  NSAIDS Failed - 12/02/2022  3:13 AM      Failed - Manual Review: Labs are only required if the patient has taken medication for more than 8 weeks.      Failed - PLT in normal range and within 360 days    Platelets  Date Value Ref Range Status  11/20/2022 413 (H) 140 - 400 Thousand/uL Final         Passed - Cr in normal range and within 360 days    Creat  Date Value Ref Range Status  11/28/2022 0.75 0.50 - 1.03 mg/dL Final         Passed - HGB in normal range and within 360 days    Hemoglobin  Date Value Ref Range Status  11/20/2022 13.8 11.7 - 15.5 g/dL Final         Passed - HCT in normal range and within 360 days    HCT  Date Value Ref Range Status  11/20/2022 38.9 35.0 - 45.0 % Final         Passed - eGFR is 30 or above and within 360 days    GFR, Est African American  Date Value Ref Range Status  10/29/2016 >89 >=60 mL/min Final   GFR, Est Non African American  Date Value Ref Range Status  10/29/2016 >89 >=60 mL/min Final   eGFR  Date Value Ref Range Status  11/28/2022 94 > OR = 60 mL/min/1.8m2 Final         Passed - Patient is not pregnant      Passed - Valid encounter within last 12 months    Recent Outpatient Visits           1 week ago Annual physical exam   [facility] [place], Netta Neat, DO   3 weeks ago Asthma with COPD   [facility] Smitty Cords, DO   3 months ago Asthma with COPD   [facility] Smitty Cords, DO   2 years ago COPD with acute exacerbation [facility])   [facility] Smitty Cords, DO   3 years ago COPD with acute exacerbation  [facility])   [facility] Smitty Cords, DO       Future Appointments             In 3 months Althea Charon, Netta Neat, DO [facility], [facility]

## 2022-12-06 ENCOUNTER — Other Ambulatory Visit: Payer: Self-pay | Admitting: Family Medicine

## 2022-12-06 DIAGNOSIS — E876 Hypokalemia: Secondary | ICD-10-CM

## 2022-12-08 NOTE — Telephone Encounter (Signed)
Requested medication (s) are due for refill today:Yes  Requested medication (s) are on the active medication list:Yes  Last refill:  12/01/22  Future visit scheduled: Yes  Notes to clinic:  Last refilled x 7 pills, unsure if provider wants another refill sent, routing for approval     Requested Prescriptions  Pending Prescriptions Disp Refills   potassium chloride SA (KLOR-CON M) 20 MEQ tablet [Pharmacy Med Name: POTASSIUM CL ER TABLETS] 7 tablet 0    Sig: TAKE 1 TABLET(20 MEQ) BY MOUTH DAILY     Endocrinology:  Minerals - Potassium Supplementation Failed - 12/06/2022  9:32 AM      Failed - K in normal range and within 360 days    Potassium  Date Value Ref Range Status  11/28/2022 3.3 (L) 3.5 - 5.3 mmol/L Final         Passed - Cr in normal range and within 360 days    Creat  Date Value Ref Range Status  11/28/2022 0.75 0.50 - 1.03 mg/dL Final         Passed - Valid encounter within last 12 months    Recent Outpatient Visits           2 weeks ago Annual physical exam   [facility] Smitty Cords, DO   3 weeks ago Asthma with COPD   [facility] Smitty Cords, DO   3 months ago Asthma with COPD   [facility] Smitty Cords, DO   2 years ago COPD with acute exacerbation [facility])   [facility] Smitty Cords, DO   3 years ago COPD with acute exacerbation [facility])   [facility] Smitty Cords, DO       Future Appointments             In 3 months Althea Charon, Netta Neat, DO [facility], [facility]

## 2022-12-09 ENCOUNTER — Other Ambulatory Visit: Payer: Self-pay

## 2022-12-09 DIAGNOSIS — E876 Hypokalemia: Secondary | ICD-10-CM

## 2022-12-09 NOTE — Telephone Encounter (Signed)
I am not quite sure what the status with this is, but 2nd day in a row refill request coming in.  I will decline again. It was ordered for 7 day course only. She should have already completed it.  Saralyn Pilar, DO [facility] 12/09/2022, 12:49 PM

## 2022-12-11 ENCOUNTER — Encounter: Payer: Self-pay | Admitting: Student in an Organized Health Care Education/Training Program

## 2022-12-11 ENCOUNTER — Ambulatory Visit (INDEPENDENT_AMBULATORY_CARE_PROVIDER_SITE_OTHER): Payer: BLUE CROSS/BLUE SHIELD | Admitting: Student in an Organized Health Care Education/Training Program

## 2022-12-11 VITALS — BP 100/60 | HR 94 | Temp 98.1°F | Ht 61.0 in | Wt 98.1 lb

## 2022-12-11 DIAGNOSIS — J432 Centrilobular emphysema: Secondary | ICD-10-CM | POA: Diagnosis not present

## 2022-12-11 DIAGNOSIS — J8410 Pulmonary fibrosis, unspecified: Secondary | ICD-10-CM

## 2022-12-11 NOTE — Progress Notes (Signed)
Synopsis: Referred in for shortness of breath by Saralyn Pilar *  Assessment & Plan:   1. Pulmonary fibrosis (HCC) 2. COPD/Emphysema  Presenting for the evaluation of shortness of breath with a history of ILD as well as emphysema. Her imaging from 2016, on my review, brings up UIP, NSIP, HSP, and CPEF to the differential diagnosis. On exam she has bibasilar crackles. I will re-initiate the workup and include a high resolution chest CT to re-evaluate lung parenchyma, get a pulmonary function test to assess spirometry/lung volumes/DLCO, and repeat the auto-immune workup. If CT suggests CPEF, I will add a TTE to assess for pulmonary hypertension. She is using Breztri samples and feels no improvement with it. I will await results from her workup before deciding on optimal inhaler therapy for her. Finally, I did counsel her extensively re smoking cessation. Patient is not ready to quit.  - Pulmonary Function Test [facility] Only; Future - CT CHEST HIGH RESOLUTION; Future - ANA 12 Plus Profile (RDL) - Hypersensitivity Pneumonitis - MyoMarker 3 Plus Profile (RDL) - Rheumatoid factor - Anti-CCP Ab, IgG + IgA (RDL)   Return in about 4 weeks (around 01/08/2023).  I spent 60 minutes caring for this patient today, including preparing to see the patient, obtaining a medical history , reviewing a separately obtained history, performing a medically appropriate examination and/or evaluation, counseling and educating the patient/family/caregiver, ordering medications, tests, or procedures, documenting clinical information in the electronic health record, and independently interpreting results (not separately reported/billed) and communicating results to the patient/family/caregiver  Raechel Chute, MD [facility] 12/11/2022 2:58 PM    End of visit medications:  No orders of the defined types were placed in this encounter.    Current Outpatient Medications:    acetaminophen  (TYLENOL) 500 MG tablet, Take 1,500 mg by mouth 3 (three) times daily. 2 to 3 times a day., Disp: , Rfl:    albuterol (VENTOLIN HFA) 108 (90 Base) MCG/ACT inhaler, Inhale 2 puffs into the lungs every 6 (six) hours as needed for wheezing or shortness of breath., Disp: 8 g, Rfl: 5   baclofen (LIORESAL) 10 MG tablet, TAKE 1/2 TO 1 TABLET(5 TO 10 MG) BY MOUTH THREE TIMES DAILY AS NEEDED FOR MUSCLE SPASMS, Disp: 90 tablet, Rfl: 3   benzonatate (TESSALON) 200 MG capsule, Take 1 capsule (200 mg total) by mouth 3 (three) times daily as needed for cough., Disp: 60 capsule, Rfl: 3   Budeson-Glycopyrrol-Formoterol (BREZTRI AEROSPHERE) 160-9-4.8 MCG/ACT AERO, Inhale 2 puffs into the lungs 2 (two) times daily., Disp: , Rfl:    fluticasone (FLONASE) 50 MCG/ACT nasal spray, Place 2 sprays into both nostrils daily., Disp: 16 g, Rfl: 0   gabapentin (NEURONTIN) 300 MG capsule, Take 1 capsule (300 mg total) by mouth at bedtime., Disp: 90 capsule, Rfl: 1   ipratropium (ATROVENT) 0.06 % nasal spray, Place 2 sprays into both nostrils 4 (four) times daily., Disp: 15 mL, Rfl: 2   loratadine (CLARITIN) 10 MG tablet, Take 10 mg by mouth daily., Disp: , Rfl:    naproxen (NAPROSYN) 500 MG tablet, TAKE 1 TABLET(500 MG) BY MOUTH TWICE DAILY WITH A MEAL FOR 2 TO 4 WEEKS THEN AS NEEDED, Disp: 180 tablet, Rfl: 0   omeprazole (PRILOSEC) 20 MG capsule, Take 20 mg by mouth daily., Disp: , Rfl: 6   potassium chloride SA (KLOR-CON M) 20 MEQ tablet, Take 1 tablet (20 mEq total) by mouth daily., Disp: 7 tablet, Rfl: 0   fluticasone (FLOVENT HFA) 220  MCG/ACT inhaler, Inhale 1 puff into the lungs in the morning and at bedtime. (Patient not taking: Reported on 12/11/2022), Disp: 1 each, Rfl: 12   Subjective:   PATIENT ID: Joy Ball GENDER: female DOB: October 05, 1965, MRN: [id number]  Chief Complaint  Patient presents with   pulmonary consult    COPD/ pulmonary fibrosis- dry cough at time prod    HPI  Patient is a pleasant 57 year old  female with a past medical history of COPD and ILD who presents to clinic for the evaluation of shortness of breath and lung disease.  Patient is presenting to re-establish care. She was seen by her primary care physician and referred back to pulmonology. She'd been last seen in 2017 by Dr. Sung Amabile, and prior to that by Dr. Kendrick Fries. She had followed with them for both COPD and pulmonary fibrosis. Previous workup has included an auto-immune workup in 2013 that was negative, as well as an anti-trypsin genotype that was normal. Her last chest CT was from 2016 and showed bilateral lower lobe basilar reticulation. It was also notable for centrilobular emphysema.  Patient reports exertional dyspnea, as well as a cough. The cough is more pronounced at home, and is at times productive of sputum. She denies any systemic signs of illness, with no fevers, chills, night sweats, or weight loss. She was started on Breztri by her PCP and doesn't feel it's helped with her symptoms.  Patient has a long standing history of smoking, and is currently smoking 1.5 packs a day. She has no interest in smoking cessation. She reports living with her boyfriend at a house that is very humid. She reports mold in the bedroom as well as in the closets. Otherwise she doesn't have any reported exposures.  Ancillary information including prior medications, full medical/surgical/family/social histories, and PFTs (when available) are listed below and have been reviewed.   Review of Systems  Constitutional:  Negative for chills, fever and weight loss.  Respiratory:  Positive for cough and shortness of breath. Negative for hemoptysis and wheezing.   Cardiovascular:  Negative for chest pain.     Objective:   Vitals:   12/11/22 1437  BP: 100/60  Pulse: 94  Temp: 98.1 F (36.7 C)  TempSrc: Temporal  SpO2: 97%  Weight: 98 lb 1.6 oz (44.5 kg)  Height: 5\' 1"  (1.549 m)   97% on RA BMI Readings from Last 3 Encounters:  12/11/22  18.54 kg/m  11/20/22 20.22 kg/m  11/11/22 20.03 kg/m   Wt Readings from Last 3 Encounters:  12/11/22 98 lb 1.6 oz (44.5 kg)  11/20/22 107 lb (48.5 kg)  11/11/22 106 lb (48.1 kg)    Physical Exam Constitutional:      Appearance: Normal appearance.  Cardiovascular:     Rate and Rhythm: Normal rate and regular rhythm.     Pulses: Normal pulses.     Heart sounds: Normal heart sounds.  Pulmonary:     Breath sounds: Rales (bibasilar, R>L) present. No wheezing or rhonchi.  Abdominal:     Palpations: Abdomen is soft.  Neurological:     General: No focal deficit present.     Mental Status: She is alert and oriented to person, place, and time. Mental status is at baseline.       Ancillary Information    Past Medical History:  Diagnosis Date   Asthma    COPD (chronic obstructive pulmonary disease) (HCC)    Emphysema lung (HCC)    GERD (gastroesophageal reflux disease)  Migraines    Seasonal allergies      Family History  Problem Relation Age of Onset   Lung cancer Father        was a smoker   Heart disease Maternal Grandfather    Emphysema Mother      Past Surgical History:  Procedure Laterality Date   CESAREAN SECTION     TUBAL LIGATION      Social History   Socioeconomic History   Marital status: Widowed    Spouse name: Not on file   Number of children: 3   Years of education: Not on file   Highest education level: Some college, no degree  Occupational History   Occupation: Nutritional therapist: OTHER  Tobacco Use   Smoking status: Every Day    Packs/day: 1.50    Years: 40.00    Additional pack years: 0.00    Total pack years: 60.00    Types: Cigarettes   Smokeless tobacco: Current  Substance and Sexual Activity   Alcohol use: Yes    Alcohol/week: 0.0 standard drinks of alcohol    Comment: occ   Drug use: No   Sexual activity: Not on file  Other Topics Concern   Not on file  Social History Narrative   Not on file   Social  Determinants of Health   Financial Resource Strain: Low Risk  (11/11/2022)   Overall Financial Resource Strain (CARDIA)    Difficulty of Paying Living Expenses: Not very hard  Food Insecurity: Food Insecurity Present (11/11/2022)   Hunger Vital Sign    Worried About Running Out of Food in the Last Year: Sometimes true    Ran Out of Food in the Last Year: Sometimes true  Transportation Needs: No Transportation Needs (11/11/2022)   PRAPARE - Administrator, Civil Service (Medical): No    Lack of Transportation (Non-Medical): No  Physical Activity: Unknown (11/11/2022)   Exercise Vital Sign    Days of Exercise per Week: 0 days    Minutes of Exercise per Session: Not on file  Stress: No Stress Concern Present (11/11/2022)   [employer] of Occupational Health - Occupational Stress Questionnaire    Feeling of Stress : Only a little  Social Connections: Moderately Isolated (11/11/2022)   Social Connection and Isolation Panel [NHANES]    Frequency of Communication with Friends and Family: More than three times a week    Frequency of Social Gatherings with Friends and Family: Once a week    Attends Religious Services: Never    Database administrator or Organizations: No    Attends Engineer, structural: Not on file    Marital Status: Living with partner  Intimate Partner Violence: Not on file     Allergies  Allergen Reactions   Amoxicillin Hives     CBC    Component Value Date/Time   WBC 12.2 (H) 11/20/2022 0933   RBC 3.77 (L) 11/20/2022 0933   HGB 13.8 11/20/2022 0933   HCT 38.9 11/20/2022 0933   PLT 413 (H) 11/20/2022 0933   MCV 103.2 (H) 11/20/2022 0933   MCH 36.6 (H) 11/20/2022 0933   MCHC 35.5 11/20/2022 0933   RDW 13.0 11/20/2022 0933   LYMPHSABS 4,148 (H) 11/20/2022 0933   MONOABS 460 10/29/2016 1225   EOSABS 85 11/20/2022 0933   BASOSABS 73 11/20/2022 0933    Pulmonary Functions Testing Results:     No data to display  Outpatient  Medications Prior to Visit  Medication Sig Dispense Refill   acetaminophen (TYLENOL) 500 MG tablet Take 1,500 mg by mouth 3 (three) times daily. 2 to 3 times a day.     albuterol (VENTOLIN HFA) 108 (90 Base) MCG/ACT inhaler Inhale 2 puffs into the lungs every 6 (six) hours as needed for wheezing or shortness of breath. 8 g 5   baclofen (LIORESAL) 10 MG tablet TAKE 1/2 TO 1 TABLET(5 TO 10 MG) BY MOUTH THREE TIMES DAILY AS NEEDED FOR MUSCLE SPASMS 90 tablet 3   benzonatate (TESSALON) 200 MG capsule Take 1 capsule (200 mg total) by mouth 3 (three) times daily as needed for cough. 60 capsule 3   Budeson-Glycopyrrol-Formoterol (BREZTRI AEROSPHERE) 160-9-4.8 MCG/ACT AERO Inhale 2 puffs into the lungs 2 (two) times daily.     fluticasone (FLONASE) 50 MCG/ACT nasal spray Place 2 sprays into both nostrils daily. 16 g 0   gabapentin (NEURONTIN) 300 MG capsule Take 1 capsule (300 mg total) by mouth at bedtime. 90 capsule 1   ipratropium (ATROVENT) 0.06 % nasal spray Place 2 sprays into both nostrils 4 (four) times daily. 15 mL 2   loratadine (CLARITIN) 10 MG tablet Take 10 mg by mouth daily.     naproxen (NAPROSYN) 500 MG tablet TAKE 1 TABLET(500 MG) BY MOUTH TWICE DAILY WITH A MEAL FOR 2 TO 4 WEEKS THEN AS NEEDED 180 tablet 0   omeprazole (PRILOSEC) 20 MG capsule Take 20 mg by mouth daily.  6   potassium chloride SA (KLOR-CON M) 20 MEQ tablet Take 1 tablet (20 mEq total) by mouth daily. 7 tablet 0   fluconazole (DIFLUCAN) 150 MG tablet Take 1 tablet (150 mg total) by mouth daily. For 1 week 7 tablet 0   levofloxacin (LEVAQUIN) 500 MG tablet Take 1 tablet (500 mg total) by mouth daily. For 7 days 7 tablet 0   fluticasone (FLOVENT HFA) 220 MCG/ACT inhaler Inhale 1 puff into the lungs in the morning and at bedtime. (Patient not taking: Reported on 12/11/2022) 1 each 12   No facility-administered medications prior to visit.

## 2022-12-11 NOTE — Patient Instructions (Signed)
Today, I ordered blood work. You can get them draw at your preferred [facility] draw station. The nearest one to [facility] is at nearby [facility] ([address], [place], [place] [address]). 

## 2022-12-12 LAB — MYOMARKER 3 PLUS PROFILE (RDL)

## 2022-12-17 LAB — ANA 12 PLUS PROFILE (RDL)

## 2022-12-17 LAB — MYOMARKER 3 PLUS PROFILE (RDL)

## 2022-12-17 LAB — HYPERSENSITIVITY PNEUMONITIS
A. Pullulans Abs: NEGATIVE
Thermoact. Saccharii: NEGATIVE

## 2022-12-17 LAB — RHEUMATOID FACTOR: Rheumatoid fact SerPl-aCnc: 12.4 IU/mL (ref <14.0)

## 2022-12-18 ENCOUNTER — Other Ambulatory Visit: Payer: Self-pay | Admitting: Family Medicine

## 2022-12-18 ENCOUNTER — Telehealth: Payer: Self-pay | Admitting: Student in an Organized Health Care Education/Training Program

## 2022-12-18 DIAGNOSIS — J310 Chronic rhinitis: Secondary | ICD-10-CM

## 2022-12-18 LAB — ANA 12 PLUS PROFILE, POSITIVE: Anti-Centromere Ab (RDL): <1:40 {titer}

## 2022-12-18 NOTE — Telephone Encounter (Signed)
I spoke with Mrs. Ilyas and she is willing to drive to [facility]. I will try to get her rescheduled there

## 2022-12-18 NOTE — Telephone Encounter (Signed)
Joy Ball is aware [employer] is out of network. Will expect a call from [facility] concerning what next steps are

## 2022-12-18 NOTE — Telephone Encounter (Signed)
Patients BCBS plan states [facility] is out network with her plan and we will need to CXL the CT appt for 12/19/22. I tried to let patient know but her mailbox is full and I couldn't leave a message. The [address] plan only offers [place], [place], [place] as location to schedule

## 2022-12-18 NOTE — Telephone Encounter (Signed)
Thanks Synetta Fail - is all of [facility] out of network, or just [facility]? Can she still get care with [place]? She'll need a CT, pulmonary function test, and possibly an echocardiogram and cardiac care. If she won't be able to get any of her care here she might want to move all the care to a system that is in-network. Could you let me know and I can call the patient and let her know.  Thanks, [employer]

## 2022-12-19 ENCOUNTER — Ambulatory Visit: Payer: BLUE CROSS/BLUE SHIELD

## 2022-12-19 LAB — HYPERSENSITIVITY PNEUMONITIS: A.Fumigatus #1 Abs: NEGATIVE

## 2022-12-19 LAB — MYOMARKER 3 PLUS PROFILE (RDL)

## 2022-12-19 LAB — ANA 12 PLUS PROFILE, POSITIVE

## 2022-12-19 NOTE — Telephone Encounter (Signed)
Requested medication (s) are due for refill today: yes  Requested medication (s) are on the active medication list: yes  Last refill:  10/3022  Future visit scheduled: yes  Notes to clinic:   Medication not assigned to a protocol, review manually      Requested Prescriptions  Pending Prescriptions Disp Refills   ipratropium (ATROVENT) 0.06 % nasal spray [Pharmacy Med Name: IPRATROPIUM 0.06% NAS SP (165)] 15 mL 2    Sig: USE 2 SPRAYS IN EACH NOSTRIL FOUR TIMES DAILY     Off-Protocol Failed - 12/18/2022  6:41 AM      Failed - Medication not assigned to a protocol, review manually.      Passed - Valid encounter within last 12 months    Recent Outpatient Visits           4 weeks ago Annual physical exam   [facility] [place] [place], Netta Neat, DO   1 month ago Asthma with COPD   [facility] Smitty Cords, DO   4 months ago Asthma with COPD   [facility] Smitty Cords, DO   2 years ago COPD with acute exacerbation [facility])   [facility] Smitty Cords, DO   3 years ago COPD with acute exacerbation [facility])   [facility] Smitty Cords, DO       Future Appointments             In 3 months Althea Charon, Netta Neat, DO [facility]           Off-Protocol Failed - 12/18/2022  6:41 AM      Failed - Medication not assigned to a protocol, review manually.      Passed - Valid encounter within last 12 months    Recent Outpatient Visits           4 weeks ago Annual physical exam   [facility] [place], Netta Neat, DO   1 month ago Asthma with COPD   [facility] Smitty Cords, DO   4 months ago Asthma with COPD   [facility] Smitty Cords, DO   2 years ago  COPD with acute exacerbation [facility])   [facility] Smitty Cords, DO   3 years ago COPD with acute exacerbation [facility])   [facility] Smitty Cords, DO       Future Appointments             In 3 months Althea Charon, Netta Neat, DO [facility]

## 2022-12-20 LAB — MYOMARKER 3 PLUS PROFILE (RDL): Anti-SS-A 52kD Ab, IgG (RDL): <20 Units (ref <20)

## 2022-12-20 LAB — ANA 12 PLUS PROFILE, POSITIVE

## 2022-12-20 LAB — HYPERSENSITIVITY PNEUMONITIS: Pigeon Serum Abs: NEGATIVE

## 2022-12-21 LAB — ANA 12 PLUS PROFILE, POSITIVE
Anti-Cardiolipin Ab, IgG (RDL): <15 GPL U/mL (ref <15)
Anti-Cardiolipin Ab, IgM (RDL): <13 MPL U/mL (ref <13)
C4 Complement (RDL): 25 mg/dL (ref 14–44)

## 2022-12-21 LAB — MYOMARKER 3 PLUS PROFILE (RDL): Anti-PM/Scl-100 Ab (RDL): <20 Units (ref <20)

## 2022-12-21 LAB — HYPERSENSITIVITY PNEUMONITIS: Thermoactinomyces vulgaris, IgG: NEGATIVE

## 2022-12-21 LAB — ANTI-CCP AB, IGG + IGA (RDL): Anti-CCP Ab, IgG + IgA (RDL): <20 Units (ref <20)

## 2022-12-22 DIAGNOSIS — Z419 Encounter for procedure for purposes other than remedying health state, unspecified: Secondary | ICD-10-CM | POA: Diagnosis not present

## 2022-12-22 LAB — ANA 12 PLUS PROFILE, POSITIVE
Anti-Cardiolipin Ab, IgA (RDL): <12 APL U/mL (ref <12)
Anti-Chromatin Ab, IgG (RDL): <20 Units (ref <20)
Anti-TPO Ab (RDL): <9 IU/mL (ref <9.0)
Speckled Pattern: 1:40 {titer} — ABNORMAL HIGH

## 2022-12-22 LAB — MYOMARKER 3 PLUS PROFILE (RDL)

## 2022-12-22 NOTE — Telephone Encounter (Signed)
I have the patient approved to do CT scan at [facility].  [facility] has the patient scheduled on 12/26/2022 at 1:15pm with Auth # [id number] valid 12/22/22 to 01/20/2023. I had to do the PA for [facility] with [facility] on the request. I have faxed the records to Rad MD

## 2022-12-23 NOTE — Telephone Encounter (Signed)
We now have the Auth for [facility] [id number] valid 12/22/2022 to 02/20/2023

## 2022-12-24 LAB — MYOMARKER 3 PLUS PROFILE (RDL): Anti-NXP-2 (P140) Ab (RDL): <20 Units (ref <20)

## 2022-12-27 LAB — ANA 12 PLUS PROFILE, POSITIVE
Anti-Scl-70 Ab (RDL): <20 Units (ref <20)
Anti-Sm Ab (RDL): <20 Units (ref <20)
C3 Complement (RDL): 127 mg/dL (ref 82–167)

## 2022-12-27 LAB — MYOMARKER 3 PLUS PROFILE (RDL)
Anti-MDA-5 Ab (CADM-140)(RDL): <20 Units (ref <20)
Anti-U1 RNP Ab (RDL): <20 Units (ref <20)

## 2022-12-27 LAB — HYPERSENSITIVITY PNEUMONITIS: Micropolyspora faeni, IgG: NEGATIVE

## 2023-01-01 ENCOUNTER — Telehealth: Payer: Self-pay

## 2023-01-01 ENCOUNTER — Ambulatory Visit: Payer: BLUE CROSS/BLUE SHIELD | Attending: Student in an Organized Health Care Education/Training Program

## 2023-01-01 DIAGNOSIS — R942 Abnormal results of pulmonary function studies: Secondary | ICD-10-CM | POA: Insufficient documentation

## 2023-01-01 DIAGNOSIS — J8410 Pulmonary fibrosis, unspecified: Secondary | ICD-10-CM

## 2023-01-01 DIAGNOSIS — J449 Chronic obstructive pulmonary disease, unspecified: Secondary | ICD-10-CM | POA: Diagnosis not present

## 2023-01-01 DIAGNOSIS — J432 Centrilobular emphysema: Secondary | ICD-10-CM

## 2023-01-01 LAB — PULMONARY FUNCTION TEST ARMC ONLY
DL/VA % pred: 33 %
DL/VA: 1.47 ml/min/mmHg/L
DLCO unc % pred: 28 %
DLCO unc: 5.35 ml/min/mmHg
FEF 25-75 Post: 1.43 L/sec
FEF 25-75 Pre: 0.99 L/sec
FEF2575-%Change-Post: 44 %
FEF2575-%Pred-Post: 59 %
FEF2575-%Pred-Pre: 41 %
FEV1-%Change-Post: 18 %
FEV1-%Pred-Post: 69 %
FEV1-%Pred-Pre: 58 %
FEV1-Post: 1.67 L
FEV1-Pre: 1.42 L
FEV1FVC-%Change-Post: 7 %
FEV1FVC-%Pred-Pre: 83 %
FEV6-%Change-Post: 10 %
FEV6-%Pred-Post: 79 %
FEV6-%Pred-Pre: 72 %
FEV6-Post: 2.37 L
FEV6-Pre: 2.15 L
FEV6FVC-%Pred-Post: 103 %
FEV6FVC-%Pred-Pre: 103 %
FVC-%Change-Post: 10 %
FVC-%Pred-Post: 77 %
FVC-%Pred-Pre: 69 %
FVC-Post: 2.37 L
FVC-Pre: 2.15 L
Post FEV1/FVC ratio: 71 %
Post FEV6/FVC ratio: 100 %
Pre FEV1/FVC ratio: 66 %
Pre FEV6/FVC Ratio: 100 %
RV % pred: 69 %
RV: 1.21 L
TLC % pred: 68 %
TLC: 3.17 L

## 2023-01-01 MED ORDER — ALBUTEROL SULFATE (2.5 MG/3ML) 0.083% IN NEBU
2.5000 mg | INHALATION_SOLUTION | Freq: Once | RESPIRATORY_TRACT | Status: AC
  Administered 2023-01-01: 2.5 mg via RESPIRATORY_TRACT
  Filled 2023-01-01: qty 3

## 2023-01-01 NOTE — Telephone Encounter (Signed)
-----   Message from [place] Dgayli sent at 01/01/2023 11:54 AM EDT ----- Want to confirm based on previous conversations, is Ms. Ksiazek going to follow up with [place] at [facility] given we are out of network with her insurance? Can you please confirm with her? Thanks, [employer]

## 2023-01-01 NOTE — Telephone Encounter (Addendum)
ATC the patient. LVM for the patient to return my call. 

## 2023-01-01 NOTE — Telephone Encounter (Signed)
I have also requested the patient CT images through [employer].

## 2023-01-01 NOTE — Progress Notes (Signed)
Want to confirm based on previous conversations, is Ms. Varas going to follow up with [place] at [facility] given we are out of network with her insurance? Can you please confirm with her? Thanks, [employer]

## 2023-01-02 ENCOUNTER — Inpatient Hospital Stay
Admission: RE | Admit: 2023-01-02 | Discharge: 2023-01-02 | Disposition: A | Discharge status: Home / Self Care | Payer: Self-pay | Source: Ambulatory Visit | Attending: Student in an Organized Health Care Education/Training Program | Admitting: Student in an Organized Health Care Education/Training Program

## 2023-01-02 DIAGNOSIS — J432 Centrilobular emphysema: Secondary | ICD-10-CM

## 2023-01-02 DIAGNOSIS — J8410 Pulmonary fibrosis, unspecified: Secondary | ICD-10-CM

## 2023-01-02 NOTE — Telephone Encounter (Addendum)
Dr. Jamse Mead- I spoke with the patient and she will continue to see you for her lungs.  Margie- I have had her images from [facility] uploaded into [employer]. I placed the Outside imaging order for the CT and XR. I did call [facility] and asked them to upload them into her chart. Can you make sure it has been done?

## 2023-01-05 NOTE — Telephone Encounter (Signed)
Imaging has been uploaded in epic.

## 2023-01-12 ENCOUNTER — Ambulatory Visit (INDEPENDENT_AMBULATORY_CARE_PROVIDER_SITE_OTHER): Payer: BLUE CROSS/BLUE SHIELD | Admitting: Student in an Organized Health Care Education/Training Program

## 2023-01-12 ENCOUNTER — Encounter: Payer: Self-pay | Admitting: Student in an Organized Health Care Education/Training Program

## 2023-01-12 VITALS — BP 120/60 | HR 100 | Temp 97.8°F | Ht 61.0 in | Wt 107.2 lb

## 2023-01-12 DIAGNOSIS — R911 Solitary pulmonary nodule: Secondary | ICD-10-CM

## 2023-01-12 DIAGNOSIS — M79606 Pain in leg, unspecified: Secondary | ICD-10-CM

## 2023-01-12 DIAGNOSIS — J8410 Pulmonary fibrosis, unspecified: Secondary | ICD-10-CM

## 2023-01-12 DIAGNOSIS — M7989 Other specified soft tissue disorders: Secondary | ICD-10-CM

## 2023-01-12 DIAGNOSIS — J432 Centrilobular emphysema: Secondary | ICD-10-CM

## 2023-01-12 MED ORDER — BREZTRI AEROSPHERE 160-9-4.8 MCG/ACT IN AERO
2.0000 | INHALATION_SPRAY | Freq: Two times a day (BID) | RESPIRATORY_TRACT | 11 refills | Status: DC
Start: 2023-01-12 — End: 2023-07-20

## 2023-01-12 NOTE — Progress Notes (Signed)
Assessment & Plan:   #Centrilobular emphysema (HCC) #ILD  Is presenting for the evaluation of shortness of breath with imaging notable for emphysema as well as subpleural reticulation. We were able to import the images into PACS and on my review, there appears to be mild increase in reticulation at the bases but no hone combing, and no overall ground glass opacities. This pattern is not consistent with UIP, and rather suggests NSIP, HSP, or DIP (given smoking). This pattern could also represent chronic pulmonary fibrosis and emphysema (CPFE). I have counseled the patient extensively on the importance of smoking cessation and avoidance of exposure to mold at her boyfriend's house. Auto-immune workup overall negative (except for non-specific ANA of 1:40 which is of unclear significance).  I will continue her on Breztri for the time being, and order a TTE to assess for pulmonary hypertension. Given that the chest CT is showing a nodule, I will hold off on further investigating her ILD until we make a diagnosis on the lung nodule.  - Budeson-Glycopyrrol-Formoterol (BREZTRI AEROSPHERE) 160-9-4.8 MCG/ACT AERO; Inhale 2 puffs into the lungs 2 (two) times daily.  Dispense: 10.7 g; Refill: 11 - ECHOCARDIOGRAM COMPLETE; Future  #Lung nodule  HRCT is showing a RLL pulmonary nodule that appears significantly progressed compared to prior imaging (chest CT in 2016). I will further investigate this with a PET/CT, and if positive, proceed with robotic assisted navigational bronchoscopy for tissue acquisition.  - NM PET Image Initial (PI) Skull Base To Thigh (F-18 FDG); Future  3. Pain and swelling of lower extremity, unspecified laterality  Reporting bilateral lower extremity swelling, which could represent stasis dermatitis, RV dysfunction, or varicose veins. Will obtain a TTE (see above) and duplex of the lower extremities to rule out DVT.  - US Venous Img Lower Bilateral (DVT); Future  Return in  about 2 weeks (around 01/26/2023).  I spent 30 minutes caring for this patient today, including preparing to see the patient, obtaining a medical history , reviewing a separately obtained history, performing a medically appropriate examination and/or evaluation, counseling and educating the patient/family/caregiver, ordering medications, tests, or procedures, documenting clinical information in the electronic health record, and independently interpreting results (not separately reported/billed) and communicating results to the patient/family/caregiver  Raechel Chute, MD [facility] 01/12/2023 10:46 AM    End of visit medications:  Meds ordered this encounter  Medications   Budeson-Glycopyrrol-Formoterol (BREZTRI AEROSPHERE) 160-9-4.8 MCG/ACT AERO    Sig: Inhale 2 puffs into the lungs 2 (two) times daily.    Dispense:  10.7 g    Refill:  11     Current Outpatient Medications:    acetaminophen (TYLENOL) 500 MG tablet, Take 1,500 mg by mouth 3 (three) times daily. 2 to 3 times a day., Disp: , Rfl:    albuterol (VENTOLIN HFA) 108 (90 Base) MCG/ACT inhaler, Inhale 2 puffs into the lungs every 6 (six) hours as needed for wheezing or shortness of breath., Disp: 8 g, Rfl: 5   baclofen (LIORESAL) 10 MG tablet, TAKE 1/2 TO 1 TABLET(5 TO 10 MG) BY MOUTH THREE TIMES DAILY AS NEEDED FOR MUSCLE SPASMS, Disp: 90 tablet, Rfl: 3   benzonatate (TESSALON) 200 MG capsule, Take 1 capsule (200 mg total) by mouth 3 (three) times daily as needed for cough., Disp: 60 capsule, Rfl: 3   fluticasone (FLONASE) 50 MCG/ACT nasal spray, Place 2 sprays into both nostrils daily., Disp: 16 g, Rfl: 0   fluticasone (FLOVENT HFA) 220 MCG/ACT inhaler, Inhale 1  puff into the lungs in the morning and at bedtime., Disp: 1 each, Rfl: 12   gabapentin (NEURONTIN) 300 MG capsule, Take 1 capsule (300 mg total) by mouth at bedtime., Disp: 90 capsule, Rfl: 1   ipratropium (ATROVENT) 0.06 % nasal spray, USE 2 SPRAYS IN  EACH NOSTRIL FOUR TIMES DAILY, Disp: 15 mL, Rfl: 2   loratadine (CLARITIN) 10 MG tablet, Take 10 mg by mouth daily., Disp: , Rfl:    naproxen (NAPROSYN) 500 MG tablet, TAKE 1 TABLET(500 MG) BY MOUTH TWICE DAILY WITH A MEAL FOR 2 TO 4 WEEKS THEN AS NEEDED, Disp: 180 tablet, Rfl: 0   omeprazole (PRILOSEC) 20 MG capsule, Take 20 mg by mouth daily., Disp: , Rfl: 6   potassium chloride SA (KLOR-CON M) 20 MEQ tablet, Take 1 tablet (20 mEq total) by mouth daily., Disp: 7 tablet, Rfl: 0   Budeson-Glycopyrrol-Formoterol (BREZTRI AEROSPHERE) 160-9-4.8 MCG/ACT AERO, Inhale 2 puffs into the lungs 2 (two) times daily., Disp: 10.7 g, Rfl: 11   Subjective:   PATIENT ID: Joy Ball GENDER: female DOB: 05/15/1966, MRN: [id number]  Chief Complaint  Patient presents with   Follow-up    SOB with exertion and dry cough at times prod with clear sputum.     HPI  Patient is a pleasant 57 year old female with a past medical history of COPD who presents for follow up.   Patient is presenting to re-establish care. She was seen by her primary care physician and referred back to pulmonology. She'd been last seen in 2017 by Dr. Sung Amabile, and prior to that by Dr. Kendrick Fries. She had followed with them for both COPD and pulmonary fibrosis. Previous workup has included an auto-immune workup in 2013 that was negative, as well as an anti-trypsin genotype that was normal. Her last chest CT was from 2016 and showed bilateral lower lobe basilar reticulation. It was also notable for centrilobular emphysema. She presented back on 6/202/2024 for initial visit and a high resolution chest CT was ordered (obtained at [facility] given insurance limitations) as well as an auto-immune workup.  Patient unfortunately continues to smoke, between 1.5 and 2 packs a day. She was recently at her son's house and felt better with less cough and less shortness of breath. She does report lower extremity edema and leg tightness that she feels has  progressed recently. She reports that there is still mold in the closet at her boyfriend's house where she lives.   She's also reporting significant improvement with Breztri, with improved shortness of breath and cough. Patient reports exertional dyspnea, as well as a cough. The cough is more pronounced at home, and is at times productive of sputum. She denies any systemic signs of illness, with no fevers, chills, night sweats, or weight loss.   Patient has a long standing history of smoking, and is currently smoking 1.5 packs a day. She reports living with her boyfriend at a house that is very humid. She reports mold in the bedroom as well as in the closets. Otherwise she doesn't have any reported exposures.  Ancillary information including prior medications, full medical/surgical/family/social histories, and PFTs (when available) are listed below and have been reviewed.   Review of Systems  Constitutional:  Negative for chills, fever and weight loss.  Respiratory:  Positive for cough and shortness of breath. Negative for hemoptysis and wheezing.   Cardiovascular:  Negative for chest pain.     Objective:   Vitals:   01/12/23 1026  BP: 120/60  Pulse: 100  Temp: 97.8 F (36.6 C)  TempSrc: Temporal  SpO2: 95%  Weight: 107 lb 3.2 oz (48.6 kg)  Height: 5\' 1"  (1.549 m)   95% on RA BMI Readings from Last 3 Encounters:  01/12/23 20.26 kg/m  12/11/22 18.54 kg/m  11/20/22 20.22 kg/m   Wt Readings from Last 3 Encounters:  01/12/23 107 lb 3.2 oz (48.6 kg)  12/11/22 98 lb 1.6 oz (44.5 kg)  11/20/22 107 lb (48.5 kg)    Physical Exam Constitutional:      Appearance: Normal appearance.  Cardiovascular:     Rate and Rhythm: Normal rate and regular rhythm.     Pulses: Normal pulses.     Heart sounds: Normal heart sounds.  Pulmonary:     Breath sounds: Rales (bibasilar, R>L) present. No wheezing or rhonchi.  Abdominal:     Palpations: Abdomen is soft.  Neurological:     General:  No focal deficit present.     Mental Status: She is alert and oriented to person, place, and time. Mental status is at baseline.       Ancillary Information    Past Medical History:  Diagnosis Date   Asthma    COPD (chronic obstructive pulmonary disease) (HCC)    Emphysema lung (HCC)    GERD (gastroesophageal reflux disease)    Migraines    Seasonal allergies      Family History  Problem Relation Age of Onset   Lung cancer Father        was a smoker   Heart disease Maternal Grandfather    Emphysema Mother      Past Surgical History:  Procedure Laterality Date   CESAREAN SECTION     TUBAL LIGATION      Social History   Socioeconomic History   Marital status: Widowed    Spouse name: Not on file   Number of children: 3   Years of education: Not on file   Highest education level: Some college, no degree  Occupational History   Occupation: Lexicographer houses    Employer: OTHER  Tobacco Use   Smoking status: Every Day    Current packs/day: 1.50    Average packs/day: 1.5 packs/day for 40.0 years (60.0 ttl pk-yrs)    Types: Cigarettes   Smokeless tobacco: Current   Tobacco comments:    1.5-2PPD 01/12/2023  Substance and Sexual Activity   Alcohol use: Yes    Alcohol/week: 0.0 standard drinks of alcohol    Comment: occ   Drug use: No   Sexual activity: Not on file  Other Topics Concern   Not on file  Social History Narrative   Not on file   Social Determinants of Health   Financial Resource Strain: Low Risk  (11/11/2022)   Overall Financial Resource Strain (CARDIA)    Difficulty of Paying Living Expenses: Not very hard  Food Insecurity: Food Insecurity Present (11/11/2022)   Hunger Vital Sign    Worried About Running Out of Food in the Last Year: Sometimes true    Ran Out of Food in the Last Year: Sometimes true  Transportation Needs: No Transportation Needs (11/11/2022)   PRAPARE - Administrator, Civil Service (Medical): No    Lack of  Transportation (Non-Medical): No  Physical Activity: Unknown (11/11/2022)   Exercise Vital Sign    Days of Exercise per Week: 0 days    Minutes of Exercise per Session: Not on file  Stress: No Stress Concern Present (11/11/2022)   [place]  [employer] - Occupational Stress Questionnaire    Feeling of Stress : Only a little  Social Connections: Unknown (12/22/2022)   Received from [facility], [facility]   Social Network    Social Network: Not on file  Recent Concern: Social Connections - Moderately Isolated (11/11/2022)   Social Connection and Isolation Panel [NHANES]    Frequency of Communication with Friends and Family: More than three times a week    Frequency of Social Gatherings with Friends and Family: Once a week    Attends Religious Services: Never    Database administrator or Organizations: No    Attends Engineer, structural: Not on file    Marital Status: Living with partner  Intimate Partner Violence: Unknown (12/22/2022)   Received from [employer], [facility]   HITS    Physically Hurt: Not on file    Insult or Talk Down To: Not on file    Threaten Physical Harm: Not on file    Scream or Curse: Not on file     Allergies  Allergen Reactions   Amoxicillin Hives     CBC    Component Value Date/Time   WBC 12.2 (H) 11/20/2022 0933   RBC 3.77 (L) 11/20/2022 0933   HGB 13.8 11/20/2022 0933   HCT 38.9 11/20/2022 0933   PLT 413 (H) 11/20/2022 0933   MCV 103.2 (H) 11/20/2022 0933   MCH 36.6 (H) 11/20/2022 0933   MCHC 35.5 11/20/2022 0933   RDW 13.0 11/20/2022 0933   LYMPHSABS 4,148 (H) 11/20/2022 0933   MONOABS 460 10/29/2016 1225   EOSABS 85 11/20/2022 0933   BASOSABS 73 11/20/2022 0933    Pulmonary Functions Testing Results:    Latest Ref Rng & Units 01/01/2023    2:22 PM  PFT Results  FVC-Pre L 2.15  P  FVC-Predicted Pre % 69  P  FVC-Post L 2.37  P  FVC-Predicted Post % 77  P  Pre FEV1/FVC % % 66  P  Post FEV1/FCV % %  71  P  FEV1-Pre L 1.42  P  FEV1-Predicted Pre % 58  P  FEV1-Post L 1.67  P  DLCO uncorrected ml/min/mmHg 5.35  P  DLCO UNC% % 28  P  DLVA Predicted % 33  P  TLC L 3.17  P  TLC % Predicted % 68  P  RV % Predicted % 69  P    P Preliminary result    Outpatient Medications Prior to Visit  Medication Sig Dispense Refill   acetaminophen (TYLENOL) 500 MG tablet Take 1,500 mg by mouth 3 (three) times daily. 2 to 3 times a day.     albuterol (VENTOLIN HFA) 108 (90 Base) MCG/ACT inhaler Inhale 2 puffs into the lungs every 6 (six) hours as needed for wheezing or shortness of breath. 8 g 5   baclofen (LIORESAL) 10 MG tablet TAKE 1/2 TO 1 TABLET(5 TO 10 MG) BY MOUTH THREE TIMES DAILY AS NEEDED FOR MUSCLE SPASMS 90 tablet 3   benzonatate (TESSALON) 200 MG capsule Take 1 capsule (200 mg total) by mouth 3 (three) times daily as needed for cough. 60 capsule 3   fluticasone (FLONASE) 50 MCG/ACT nasal spray Place 2 sprays into both nostrils daily. 16 g 0   fluticasone (FLOVENT HFA) 220 MCG/ACT inhaler Inhale 1 puff into the lungs in the morning and at bedtime. 1 each 12   gabapentin (NEURONTIN) 300 MG capsule Take 1 capsule (300 mg total) by mouth  at bedtime. 90 capsule 1   ipratropium (ATROVENT) 0.06 % nasal spray USE 2 SPRAYS IN EACH NOSTRIL FOUR TIMES DAILY 15 mL 2   loratadine (CLARITIN) 10 MG tablet Take 10 mg by mouth daily.     naproxen (NAPROSYN) 500 MG tablet TAKE 1 TABLET(500 MG) BY MOUTH TWICE DAILY WITH A MEAL FOR 2 TO 4 WEEKS THEN AS NEEDED 180 tablet 0   omeprazole (PRILOSEC) 20 MG capsule Take 20 mg by mouth daily.  6   potassium chloride SA (KLOR-CON M) 20 MEQ tablet Take 1 tablet (20 mEq total) by mouth daily. 7 tablet 0   Budeson-Glycopyrrol-Formoterol (BREZTRI AEROSPHERE) 160-9-4.8 MCG/ACT AERO Inhale 2 puffs into the lungs 2 (two) times daily. (Patient not taking: Reported on 01/12/2023)     No facility-administered medications prior to visit.

## 2023-01-12 NOTE — Patient Instructions (Signed)
The [facility] Quitline: Call 1-800-QUIT-NOW 575-707-4353). The [facility] Quitline is a free service for [employer]. Trained counselors are available from 8 am until 3 am, 365 days per year. Services are available in both [place] and [place].   Web Resources Free online support programs can help you track your progress and share experiences with others who are quitting. These are examples: www.becomeanex.org www.trytostop.org  www.smokefree.gov  www.[employer].com.br.aspx  [facility]: offers comprehensive in-person tobacco treatment counseling at [facility] ([address], [place] [address]).  Open to everyone. Virtual appointments available. Free parking. Call [phone] to schedule an appointment or [phone] for general information.    Tobacco Cessation Medications  Nicotine Replacement Therapy (NRT)  Nicotine is the addictive part of tobacco smoke, but not the most dangerous part. There are 7000 other toxins in cigarettes, including carbon monoxide, that cause disease. People do not generally become addicted to medication. Common problems: People don't use enough medication or stop too early. Medications are safe and effective. Overdose is very uncommon. Use medications as long as needed (3 months minimum). Some combinations work better than single medications. Long acting medications like the NRT patch and bupropion provide continuous treatment for withdrawal symptoms.  PLUS  Short acting medications like the NRT gum, lozenge, inhaler, and nasal spray help people to cope with breakthrough cravings.  ? Nicotine Patch  Place patch on hairless skin on upper body, including arms and back. Each day: discard old patch, shower, apply new patch to a different site. Apply hydrocortisone cream to mildly red/irritated areas. Call provider if rash develops. If patch causes sleep disturbance, remove patch  at bedtime and replace each morning after shower. Side effects may include: skin irritation, headache, insomnia, abnormal/vivid dreams.  ? Nicotine Gum  Chew gum slowly, park in cheek when peppery taste or tingling sensation begins (about 15-30 chews). When taste or tingling goes away, begin chewing again. Use until nicotine is gone (taste or tingle does not return, usually 30 minutes). Park in different areas of mouth. Nicotine is absorbed through the lining of the mouth. Use enough to control cravings, up to 24 pieces per day (if used alone). Avoid eating or drinking for 15 minutes before using and during use. Side effects may include: mouth/jaw soreness, hiccups, indigestion, hypersalivation.  If gum is not chewed correctly, additional side effects may include lightheadedness, nausea/vomiting, throat and mouth irritation.  ? Nicotine Lozenge  Allow to dissolve slowly in mouth (20-30 minutes). Do not chew or swallow. Nicotine release may cause a warm tingling sensation. Occasionally rotate to different areas of the mouth. Use enough to control cravings, up to 20 lozenges per day (if used alone). Avoid eating or drinking for 15 minutes before using and during use. Side effects may include: nausea, hiccups, cough, heartburn, headache, gas, insomnia.  ? Nicotine Nasal Spray Use 1 spray in each nostril (1 dose) and tilt head back for 1 minute. Do not sniff, swallow, or inhale through nose.  Use at least 8 doses (1 spray in each nostril) , up to 40 doses per day (if used alone). To reduce nasal irritation, spray on cotton swab and insert into nose. Side effects may include: nasal and/or throat irritation (hot, peppery, or burning sensation), nasal irritation, tearing, sneezing, cough, headache.  ? Nicotine Oral Inhaler (puffer) Inhale into the back of the throat or puff in short breaths. Do not inhale into the lungs.  Puff continuously for 20 minutes (about 80 puffs) until cartridge  is  empty. Change cartridge when it loses the "burning in throat" sensation (feels like air only). Open cartridges can be saved and used again within 24 hours. Use at least 6 and up to 16 cartridges per day (if used alone).  Avoid eating or drinking for 15 minutes before using and during use. Side effects may include: mouth and/or throat irritation, unpleasant taste, cough, nasal irritation, indigestion, hiccups, headache.  ? Chantix (varenicline) Days 1-3: Take one 0.5 mg white pill each morning for 3 days, one week before quit date. Days 4-7: Increase to one 0.5 mg white pill twice a day in morning and evening for 4 days.  On Day 8 (target quit date), increase to one 1 mg blue pill twice a day. Maintain this dose for a minimum of 3 months. Take with food and a full glass of water to reduce nausea. Be sure that the two doses are at least 8 hours apart, but try to take second dose early in the evening (i.e. 6 pm) to avoid sleep problems. Common side effects include: nausea, insomnia, headache, abnormal/vivid dreams. Tell your doctor if you have any history of psychiatric illness prior to starting Chantix.  STOP taking CHANTIX and contact a healthcare provider immediately if you experience agitation, hostility, depressed mood, changes in thoughts or behavior that are not typical for you, thinking about or attempting suicide, allergic or skin reactions including swelling, rash, redness, or peeling of the skin.  For patients who have heart disease: Smoking is a major risk factor for cardiovascular disease, and Chantix can help you quit smoking. Chantix may be associated with a small, increased risk of certain heart events in patients who have heart disease. If you have any new or worsening symptoms of heart disease while taking Chantix, such as shortness of breath or trouble breathing, new or worsening chest pain, or new or worsening pain in your legs when walking, call your doctor or get emergency medical  help immediately.  ? Wellbutrin / Zyban (bupropion) Take one 150 mg pill each morning for 3 days, one week before target quit date. On Day 4, increase to one 150 mg pill twice a day, morning and evening.  Maintain this dose for a minimum of 3 months. Be sure that the two doses are at least 8 hours apart, but try to take second dose early in the evening (i.e. 6 pm) to avoid sleep problems. Avoid or minimize use of alcohol when taking this medication. Common side effects include: dry mouth, headache, insomnia, nausea, weight loss.  Risk of seizure is 06/998. STOP taking BUPROPION and contact a healthcare provider immediately if you experience agitation, hostility, depressed mood, changes in thoughts or behavior that are not typical for you, thinking about or attempting suicide, allergic or skin reactions including swelling, rash, redness, or peeling of the skin.

## 2023-01-19 ENCOUNTER — Encounter: Payer: Self-pay | Admitting: Student in an Organized Health Care Education/Training Program

## 2023-01-19 ENCOUNTER — Encounter: Payer: Self-pay | Admitting: Family Medicine

## 2023-01-19 ENCOUNTER — Telehealth: Payer: BLUE CROSS/BLUE SHIELD | Admitting: Family Medicine

## 2023-01-19 DIAGNOSIS — J4489 Other specified chronic obstructive pulmonary disease: Secondary | ICD-10-CM

## 2023-01-19 DIAGNOSIS — J45909 Unspecified asthma, uncomplicated: Secondary | ICD-10-CM

## 2023-01-19 DIAGNOSIS — J8410 Pulmonary fibrosis, unspecified: Secondary | ICD-10-CM | POA: Diagnosis not present

## 2023-01-19 DIAGNOSIS — Z720 Tobacco use: Secondary | ICD-10-CM | POA: Diagnosis not present

## 2023-01-19 MED ORDER — BUPROPION HCL ER (SR) 150 MG PO TB12
150.0000 mg | ORAL_TABLET | Freq: Two times a day (BID) | ORAL | 0 refills | Status: DC
Start: 2023-01-19 — End: 2023-04-17

## 2023-01-19 MED ORDER — NICOTINE 21 MG/24HR TD PT24: 21.0000 mg | MEDICATED_PATCH | Freq: Every day | TRANSDERMAL | 0 refills | Status: DC

## 2023-01-19 NOTE — Progress Notes (Signed)
Subjective:    Patient ID: Joy Ball, female    DOB: 1966/01/26, 57 y.o.   MRN: [id number]  Joy Ball is a 57 y.o. female presenting on 01/19/2023 for No chief complaint on file.  Virtual / Telehealth Encounter - Video Visit via MyChart The purpose of this virtual visit is to provide medical care while limiting exposure to the novel coronavirus (COVID19) for both patient and office staff.  Consent was obtained for remote visit:  Yes.   Answered questions that patient had about telehealth interaction:  Yes.   I discussed the limitations, risks, security and privacy concerns of performing an evaluation and management service by video/telephone. I also discussed with the patient that there may be a patient responsible charge related to this service. The patient expressed understanding and agreed to proceed.  Patient Location: Home Provider Location: [facility] (Office)  Participants in virtual visit: - Patient: Joy Ball - CMA: Oneal Grout CMA - Provider: Dr Althea Charon   HPI  Asthma-COPD Overlap History of Pulm Fibrosis Uses Flovent. Albuterol AS NEEDED Waiting on new medicaid card, asking about inhalers Difficulty sleeping at night, she has de-humidifier If wake up with coughing, will have difficulty falling back to sleep Often uses inhaler / nebulizer to try to help fall back  Tobacco Abuse Currently active smoker 42 years, 1.5ppd In past able to quit for 1 month, on Wellbutrin and Patches, she tried Chantix but it gave her nightmares. She is ready to quit again.      11/11/2022    2:00 PM 06/21/2019   11:04 AM 02/10/2019   11:32 AM  Depression screen PHQ 2/9  Decreased Interest 0 0 0  Down, Depressed, Hopeless 0 0 0  PHQ - 2 Score 0 0 0    Social History   Tobacco Use   Smoking status: Every Day    Current packs/day: 1.50    Average packs/day: 1.5 packs/day for 40.0 years (60.0 ttl pk-yrs)    Types: Cigarettes   Smokeless tobacco:  Current   Tobacco comments:    1.5-2PPD 01/12/2023  Substance Use Topics   Alcohol use: Yes    Alcohol/week: 0.0 standard drinks of alcohol    Comment: occ   Drug use: No    Review of Systems Per HPI unless specifically indicated above     Objective:    There were no vitals taken for this visit.  Wt Readings from Last 3 Encounters:  01/12/23 107 lb 3.2 oz (48.6 kg)  12/11/22 98 lb 1.6 oz (44.5 kg)  11/20/22 107 lb (48.5 kg)    Physical Exam  Note examination was completely remotely via video observation objective data only  Gen - well-appearing, no acute distress or apparent pain, comfortable HEENT - eyes appear clear without discharge or redness Heart/Lungs - cannot examine virtually - observed no evidence of coughing or labored breathing. Abd - cannot examine virtually  Skin - face visible today- no rash Neuro - awake, alert, oriented Psych - not anxious appearing   Results for orders placed or performed in visit on 01/01/23  Pulmonary Function Test [facility] Only  Result Value Ref Range   FVC-Pre 2.15 L   FVC-%Pred-Pre 69 %   FVC-Post 2.37 L   FVC-%Pred-Post 77 %   FVC-%Change-Post 10 %   FEV1-Pre 1.42 L   FEV1-%Pred-Pre 58 %   FEV1-Post 1.67 L   FEV1-%Pred-Post 69 %   FEV1-%Change-Post 18 %   FEV6-Pre 2.15 L  FEV6-%Pred-Pre 72 %   FEV6-Post 2.37 L   FEV6-%Pred-Post 79 %   FEV6-%Change-Post 10 %   Pre FEV1/FVC ratio 66 %   FEV1FVC-%Pred-Pre 83 %   Post FEV1/FVC ratio 71 %   FEV1FVC-%Change-Post 7 %   Pre FEV6/FVC Ratio 100 %   FEV6FVC-%Pred-Pre 103 %   Post FEV6/FVC ratio 100 %   FEV6FVC-%Pred-Post 103 %   FEF 25-75 Pre 0.99 L/sec   FEF2575-%Pred-Pre 41 %   FEF 25-75 Post 1.43 L/sec   FEF2575-%Pred-Post 59 %   FEF2575-%Change-Post 44 %   RV 1.21 L   RV % pred 69 %   TLC 3.17 L   TLC % pred 68 %   DLCO unc 5.35 ml/min/mmHg   DLCO unc % pred 28 %   DL/VA 4.13 ml/min/mmHg/L   DL/VA % pred 33 %      Assessment & Plan:   Problem List Items  Addressed This Visit     Asthma with COPD   Relevant Medications   nicotine (NICODERM CQ) 21 mg/24hr patch   Pulmonary fibrosis (HCC)   Tobacco abuse - Primary   Relevant Medications   buPROPion (WELLBUTRIN SR) 150 MG 12 hr tablet   nicotine (NICODERM CQ) 21 mg/24hr patch    Active smoker 1.5 ppd 42+ years  Past failed Chantix side effect night mare, temporary quit on Wellbutrin, now re-try with following Pulm advised to quit smoking  For your smoking cessation, here is the plan: - Start Wellbutrin 150mg  tablets - take 1 tab daily for 3 days - Then, start taking 1 tab TWICE daily (about every 12 hours). Continue this for about 12 weeks. - To quit smoking - wait to start this treatment until you are mentally ready to quit. - Choose a quit date in the future. Start taking the medicine about 1-2 weeks away from your quit date. Reduce the number of cigarettes daily until you eventually QUIT completely on your quit date. Continue taking the Wellbutrin twice daily for total 12 weeks, we may continue for longer.  [place] Quitline 1 800-QUIT NOW  Ordered Nicotine Patches  start 21mg  daily patch for 28 days, can reduce to 14mg  after that, contact us if need new order.  Meds ordered this encounter  Medications   buPROPion (WELLBUTRIN SR) 150 MG 12 hr tablet    Sig: Take 1 tablet (150 mg total) by mouth 2 (two) times daily.    Dispense:  180 tablet    Refill:  0   nicotine (NICODERM CQ) 21 mg/24hr patch    Sig: Place 1 patch (21 mg total) onto the skin daily.    Dispense:  28 patch    Refill:  0    Follow up plan: Return in about 3 months (around 04/21/2023), or if symptoms worsen or fail to improve, for smoking.  Patient verbalizes understanding with the above medical recommendations including the limitation of remote medical advice.  Specific follow-up and call-back criteria were given for patient to follow-up or seek medical care more urgently if needed.  Total duration of direct  patient care provided via video conference: 10 minutes   Saralyn Pilar, DO [facility] 01/19/2023, 4:37 PM

## 2023-01-19 NOTE — Patient Instructions (Addendum)
Thank you for coming to the office today.  For your smoking cessation, here is the plan: - Start Wellbutrin 150mg  tablets - take 1 tab daily for 3 days - Then, start taking 1 tab TWICE daily (about every 12 hours). Continue this for about 12 weeks. - To quit smoking - wait to start this treatment until you are mentally ready to quit. - Choose a quit date in the future. Start taking the medicine about 1-2 weeks away from your quit date. Reduce the number of cigarettes daily until you eventually QUIT completely on your quit date. Continue taking the Wellbutrin twice daily for total 7 weeks, we may continue for longer.  [place] Quitline 1 800-QUIT NOW  Ordered Nicotine Patches    Please schedule a Follow-up Appointment to: Return in about 3 months (around 04/21/2023), or if symptoms worsen or fail to improve, for smoking.  If you have any other questions or concerns, please feel free to call the office or send a message through MyChart. You may also schedule an earlier appointment if necessary.  Additionally, you may be receiving a survey about your experience at our office within a few days to 1 week by e-mail or mail. We value your feedback.  Saralyn Pilar, DO [facility] [place], [place]

## 2023-01-22 ENCOUNTER — Telehealth: Payer: Self-pay | Admitting: Student in an Organized Health Care Education/Training Program

## 2023-01-22 DIAGNOSIS — R911 Solitary pulmonary nodule: Secondary | ICD-10-CM

## 2023-01-22 DIAGNOSIS — Z419 Encounter for procedure for purposes other than remedying health state, unspecified: Secondary | ICD-10-CM | POA: Diagnosis not present

## 2023-01-22 HISTORY — PX: EYE SURGERY: SHX253

## 2023-01-22 NOTE — Telephone Encounter (Addendum)
Spoke to patient and relayed below message/recommendations. She will reach out to insurance to see who is in network.  Nothing further needed.

## 2023-01-22 NOTE — Telephone Encounter (Signed)
Patient's insurance has denied the PET Scan. When I called to verify if we did a Bronch on patient would [facility] be covered. Per the plan [facility] contract termed in 2020. The only way [facility] would be covered by the [facility] Plan would be for emergency or urgent care needs.

## 2023-01-22 NOTE — Telephone Encounter (Signed)
Spoke to patient.  She stated that [facility] is network. Referral has been placed.  Nothing further needed.

## 2023-01-22 NOTE — Addendum Note (Signed)
Addended by: Lajoyce Lauber A on: 01/22/2023 01:44 PM   Modules accepted: Orders

## 2023-01-23 ENCOUNTER — Telehealth: Payer: Self-pay | Admitting: Student in an Organized Health Care Education/Training Program

## 2023-01-23 NOTE — Telephone Encounter (Signed)
I have faxed the order for echo, and US venous lower  bilateral order to [facility] for them to schedule for the patient

## 2023-01-23 NOTE — Telephone Encounter (Addendum)
Spoke to patient. She stated that insurance approved echo for [facility].   Synetta Fail, please schedule echo.  She is scheduled to follow up with our office on 01/27/23. She is questioning if she should keep this appt. Referral placed to [facility] yesterday, as insurance is out of network with [facility] and will not cover PET.  Dr. Aundria Rud, please advise if okay to cancel appt? Thanks

## 2023-01-23 NOTE — Telephone Encounter (Signed)
Ok to cancel appointment. She should have all her care in one place.

## 2023-01-23 NOTE — Telephone Encounter (Signed)
Appt canceled.  Patient is aware and voiced her understanding.  Nothing further needed.

## 2023-01-27 ENCOUNTER — Ambulatory Visit: Payer: BLUE CROSS/BLUE SHIELD | Admitting: Student in an Organized Health Care Education/Training Program

## 2023-02-02 ENCOUNTER — Telehealth: Payer: Self-pay

## 2023-02-02 NOTE — Telephone Encounter (Signed)
Called pt to confirm upcoming appointment after missed automatic messages.  Pt wants to cancel appt.

## 2023-02-03 ENCOUNTER — Encounter: Payer: Self-pay | Admitting: Family Medicine

## 2023-02-03 DIAGNOSIS — Z720 Tobacco use: Secondary | ICD-10-CM

## 2023-02-03 MED ORDER — NICOTINE 14 MG/24HR TD PT24: 14.0000 mg | MEDICATED_PATCH | Freq: Every day | TRANSDERMAL | 0 refills | Status: DC

## 2023-02-04 ENCOUNTER — Encounter: Payer: BLUE CROSS/BLUE SHIELD | Admitting: Family Medicine

## 2023-02-17 ENCOUNTER — Encounter: Payer: Self-pay | Admitting: Family Medicine

## 2023-02-17 DIAGNOSIS — B370 Candidal stomatitis: Secondary | ICD-10-CM

## 2023-02-17 MED ORDER — NYSTATIN 100000 UNIT/ML MT SUSP
5.0000 mL | Freq: Four times a day (QID) | OROMUCOSAL | 2 refills | Status: DC
Start: 2023-02-17 — End: 2023-10-27

## 2023-02-17 NOTE — Telephone Encounter (Signed)
Please review.  KP

## 2023-02-22 DIAGNOSIS — Z419 Encounter for procedure for purposes other than remedying health state, unspecified: Secondary | ICD-10-CM | POA: Diagnosis not present

## 2023-03-02 ENCOUNTER — Other Ambulatory Visit: Payer: Self-pay | Admitting: Family Medicine

## 2023-03-02 DIAGNOSIS — J8410 Pulmonary fibrosis, unspecified: Secondary | ICD-10-CM

## 2023-03-02 DIAGNOSIS — R053 Chronic cough: Secondary | ICD-10-CM

## 2023-03-02 DIAGNOSIS — J4489 Other specified chronic obstructive pulmonary disease: Secondary | ICD-10-CM

## 2023-03-03 NOTE — Telephone Encounter (Signed)
Requested Prescriptions  Pending Prescriptions Disp Refills   benzonatate (TESSALON) 200 MG capsule [Pharmacy Med Name: BENZONATATE 200MG  CAPSULES] 60 capsule 3    Sig: TAKE 1 CAPSULE(200 MG) BY MOUTH THREE TIMES DAILY AS NEEDED FOR COUGH     Ear, Nose, and Throat:  Antitussives/Expectorants Passed - 03/02/2023  3:13 AM      Passed - Valid encounter within last 12 months    Recent Outpatient Visits           1 month ago Tobacco abuse   [facility] [place], Netta Neat, DO   3 months ago Annual physical exam   [facility] Smitty Cords, DO   3 months ago Asthma with COPD   [facility] Smitty Cords, DO   6 months ago Asthma with COPD   [facility] Smitty Cords, DO   2 years ago COPD with acute exacerbation [facility])   [facility] Smitty Cords, DO       Future Appointments             In 2 weeks Althea Charon, Netta Neat, DO [facility], [facility]

## 2023-03-18 ENCOUNTER — Ambulatory Visit: Payer: Medicaid Other | Admitting: Family Medicine

## 2023-03-20 ENCOUNTER — Other Ambulatory Visit (HOSPITAL_COMMUNITY): Payer: Self-pay

## 2023-03-20 ENCOUNTER — Telehealth: Payer: Self-pay

## 2023-03-20 NOTE — Telephone Encounter (Signed)
Pharmacy Patient Advocate Encounter   Received notification from CoverMyMeds that prior authorization for Breztri Aerosphere 160-9-4.8MCG/ACT aerosol is required/requested.   Insurance verification completed.   The patient is insured through [facility] [place] .   Per test claim: PA required; PA submitted to [place] Medicaid via CoverMyMeds Key/confirmation #/EOC [facility] Status is pending

## 2023-03-23 ENCOUNTER — Ambulatory Visit: Payer: BLUE CROSS/BLUE SHIELD | Admitting: Family Medicine

## 2023-03-24 DIAGNOSIS — Z419 Encounter for procedure for purposes other than remedying health state, unspecified: Secondary | ICD-10-CM | POA: Diagnosis not present

## 2023-03-25 NOTE — Telephone Encounter (Signed)
Verlon Au - unfortunately the patient's insurance doesn't cover care a [facility]. She has transitioned her care over to [facility].  Thanks [employer]

## 2023-03-25 NOTE — Telephone Encounter (Signed)
Noted  

## 2023-03-25 NOTE — Telephone Encounter (Signed)
Pharmacy Patient Advocate Encounter  Received notification from [facility] that Prior Authorization for [facility] Aerosphere 160-9-4.8MCG/ACT aerosol has been DENIED.  Full denial letter will be uploaded to the media tab. See denial reason below.  Per the health plan's preferred drug list, at least 2 preferred drugs must be tried before requesting this drug or tell us why the member cannot try any preferred alternatives. Here is a list of preferred alternatives: Advair Diskus, Advair HFA, Dulera Inhaler Symbicort Inhaler. Per our records, the member has already tried Advair Diskus Note: Some preferred drug(s) ma have quantity limits. Refer to the health plan's preferred drug list for additional details.  PA #/Case ID/Reference #: [id number]  Please be advised we currently do not have a Pharmacist to review denials, therefore you will need to process appeals accordingly as needed. Thanks for your support at this time. Contact for appeals are as follows: Phone: [phone], Fax: [phone]

## 2023-03-29 DIAGNOSIS — R059 Cough, unspecified: Secondary | ICD-10-CM | POA: Diagnosis not present

## 2023-04-01 ENCOUNTER — Other Ambulatory Visit (HOSPITAL_COMMUNITY): Payer: Self-pay

## 2023-04-17 ENCOUNTER — Other Ambulatory Visit: Payer: Self-pay | Admitting: Family Medicine

## 2023-04-17 DIAGNOSIS — Z720 Tobacco use: Secondary | ICD-10-CM

## 2023-04-17 NOTE — Telephone Encounter (Signed)
Requested Prescriptions  Pending Prescriptions Disp Refills   buPROPion (WELLBUTRIN SR) 150 MG 12 hr tablet [Pharmacy Med Name: BUPROPION SR 150MG  TABLETS (12 H)] 180 tablet 0    Sig: TAKE 1 TABLET(150 MG) BY MOUTH TWICE DAILY     Psychiatry: Antidepressants - bupropion Passed - 04/17/2023  3:18 AM      Passed - Cr in normal range and within 360 days    Creat  Date Value Ref Range Status  11/28/2022 0.75 0.50 - 1.03 mg/dL Final         Passed - AST in normal range and within 360 days    AST  Date Value Ref Range Status  11/20/2022 28 10 - 35 U/L Final         Passed - ALT in normal range and within 360 days    ALT  Date Value Ref Range Status  11/20/2022 19 6 - 29 U/L Final         Passed - Last BP in normal range    BP Readings from Last 1 Encounters:  01/12/23 120/60         Passed - Valid encounter within last 6 months    Recent Outpatient Visits           2 months ago Tobacco abuse   [facility] [place], Netta Neat, DO   4 months ago Annual physical exam   [facility] Smitty Cords, DO   5 months ago Asthma with COPD   [facility] Smitty Cords, DO   8 months ago Asthma with COPD   [facility] Smitty Cords, DO   2 years ago COPD with acute exacerbation [facility])   [facility] [place], Netta Neat, [place]

## 2023-04-24 DIAGNOSIS — Z419 Encounter for procedure for purposes other than remedying health state, unspecified: Secondary | ICD-10-CM | POA: Diagnosis not present

## 2023-04-29 DIAGNOSIS — R059 Cough, unspecified: Secondary | ICD-10-CM | POA: Diagnosis not present

## 2023-05-14 ENCOUNTER — Other Ambulatory Visit: Payer: Self-pay | Admitting: Family Medicine

## 2023-05-14 DIAGNOSIS — G8929 Other chronic pain: Secondary | ICD-10-CM

## 2023-05-14 DIAGNOSIS — M7712 Lateral epicondylitis, left elbow: Secondary | ICD-10-CM

## 2023-05-15 MED ORDER — NAPROXEN 500 MG PO TABS
500.0000 mg | ORAL_TABLET | Freq: Two times a day (BID) | ORAL | 0 refills | Status: DC
Start: 2023-05-15 — End: 2023-09-04

## 2023-05-24 DIAGNOSIS — Z419 Encounter for procedure for purposes other than remedying health state, unspecified: Secondary | ICD-10-CM | POA: Diagnosis not present

## 2023-06-24 DIAGNOSIS — Z419 Encounter for procedure for purposes other than remedying health state, unspecified: Secondary | ICD-10-CM | POA: Diagnosis not present

## 2023-07-16 ENCOUNTER — Telehealth: Payer: Medicaid Other | Admitting: Family Medicine

## 2023-07-16 DIAGNOSIS — J069 Acute upper respiratory infection, unspecified: Secondary | ICD-10-CM | POA: Diagnosis not present

## 2023-07-16 MED ORDER — IPRATROPIUM BROMIDE 0.03 % NA SOLN
2.0000 | Freq: Two times a day (BID) | NASAL | 0 refills | Status: DC
Start: 2023-07-16 — End: 2024-01-04

## 2023-07-16 MED ORDER — BENZONATATE 100 MG PO CAPS
100.0000 mg | ORAL_CAPSULE | Freq: Three times a day (TID) | ORAL | 0 refills | Status: DC | PRN
Start: 2023-07-16 — End: 2023-11-18

## 2023-07-16 NOTE — Progress Notes (Signed)
E-Visit for Upper Respiratory Infection   We are sorry you are not feeling well.  Here is how we plan to help!  RSV is treated with rest, hydration, and OTC medications.  Based on what you have shared with me, it looks like you may have a viral upper respiratory infection.  Upper respiratory infections are caused by a large number of viruses; however, rhinovirus is the most common cause.   Symptoms vary from person to person, with common symptoms including sore throat, cough, fatigue or lack of energy and feeling of general discomfort.  A low-grade fever of up to 100.4 may present, but is often uncommon.  Symptoms vary however, and are closely related to a person's age or underlying illnesses.  The most common symptoms associated with an upper respiratory infection are nasal discharge or congestion, cough, sneezing, headache and pressure in the ears and face.  These symptoms usually persist for about 3 to 10 days, but can last up to 2 weeks.  It is important to know that upper respiratory infections do not cause serious illness or complications in most cases.    Upper respiratory infections can be transmitted from person to person, with the most common method of transmission being a person's hands.  The virus is able to live on the skin and can infect other persons for up to 2 hours after direct contact.  Also, these can be transmitted when someone coughs or sneezes; thus, it is important to cover the mouth to reduce this risk.  To keep the spread of the illness at bay, good hand hygiene is very important.  This is an infection that is most likely caused by a virus. There are no specific treatments other than to help you with the symptoms until the infection runs its course.  We are sorry you are not feeling well.  Here is how we plan to help!   For nasal congestion, you may use an oral decongestants such as Mucinex D or if you have glaucoma or high blood pressure use plain Mucinex.  Saline nasal  spray or nasal drops can help and can safely be used as often as needed for congestion.  For your congestion, I have prescribed Ipratropium Bromide nasal spray 0.03% two sprays in each nostril 2-3 times a day  If you do not have a history of heart disease, hypertension, diabetes or thyroid disease, prostate/bladder issues or glaucoma, you may also use Sudafed to treat nasal congestion.  It is highly recommended that you consult with a pharmacist or your primary care physician to ensure this medication is safe for you to take.     If you have a cough, you may use cough suppressants such as Delsym and Robitussin.  If you have glaucoma or high blood pressure, you can also use Coricidin HBP.   For cough I have prescribed for you A prescription cough medication called Tessalon Perles 100 mg. You may take 1-2 capsules every 8 hours as needed for cough  If you have a sore or scratchy throat, use a saltwater gargle-  to  teaspoon of salt dissolved in a 4-ounce to 8-ounce glass of warm water.  Gargle the solution for approximately 15-30 seconds and then spit.  It is important not to swallow the solution.  You can also use throat lozenges/cough drops and Chloraseptic spray to help with throat pain or discomfort.  Warm or cold liquids can also be helpful in relieving throat pain.  For headache, pain or general discomfort,  you can use Ibuprofen or Tylenol as directed.   Some authorities believe that zinc sprays or the use of Echinacea may shorten the course of your symptoms.   HOME CARE Only take medications as instructed by your medical team. Be sure to drink plenty of fluids. Water is fine as well as fruit juices, sodas and electrolyte beverages. You may want to stay away from caffeine or alcohol. If you are nauseated, try taking small sips of liquids. How do you know if you are getting enough fluid? Your urine should be a pale yellow or almost colorless. Get rest. Taking a steamy shower or using a  humidifier may help nasal congestion and ease sore throat pain. You can place a towel over your head and breathe in the steam from hot water coming from a faucet. Using a saline nasal spray works much the same way. Cough drops, hard candies and sore throat lozenges may ease your cough. Avoid close contacts especially the very young and the elderly Cover your mouth if you cough or sneeze Always remember to wash your hands.   GET HELP RIGHT AWAY IF: You develop worsening fever. If your symptoms do not improve within 10 days You develop yellow or green discharge from your nose over 3 days. You have coughing fits You develop a severe head ache or visual changes. You develop shortness of breath, difficulty breathing or start having chest pain Your symptoms persist after you have completed your treatment plan  MAKE SURE YOU  Understand these instructions. Will watch your condition. Will get help right away if you are not doing well or get worse.  Thank you for choosing an e-visit.  Your e-visit answers were reviewed by a board certified advanced clinical practitioner to complete your personal care plan. Depending upon the condition, your plan could have included both over the counter or prescription medications.  Please review your pharmacy choice. Make sure the pharmacy is open so you can pick up prescription now. If there is a problem, you may contact your provider through [employer] and have the prescription routed to another pharmacy.  Your safety is important to [place]. If you have drug allergies check your prescription carefully.   For the next 24 hours you can use MyChart to ask questions about today's visit, request a non-urgent call back, or ask for a work or school excuse. You will get an email in the next two days asking about your experience. I hope that your e-visit has been valuable and will speed your recovery.    I provided 5 minutes of non face-to-face time during this  encounter for chart review, medication and order placement, as well as and documentation.

## 2023-07-20 ENCOUNTER — Encounter: Payer: Self-pay | Admitting: Student in an Organized Health Care Education/Training Program

## 2023-07-20 ENCOUNTER — Ambulatory Visit (INDEPENDENT_AMBULATORY_CARE_PROVIDER_SITE_OTHER): Payer: Medicaid Other | Admitting: Student in an Organized Health Care Education/Training Program

## 2023-07-20 VITALS — BP 116/80 | HR 99 | Temp 97.6°F | Ht 61.0 in | Wt 108.0 lb

## 2023-07-20 DIAGNOSIS — J8410 Pulmonary fibrosis, unspecified: Secondary | ICD-10-CM | POA: Diagnosis not present

## 2023-07-20 DIAGNOSIS — J432 Centrilobular emphysema: Secondary | ICD-10-CM

## 2023-07-20 DIAGNOSIS — R911 Solitary pulmonary nodule: Secondary | ICD-10-CM

## 2023-07-20 MED ORDER — BREZTRI AEROSPHERE 160-9-4.8 MCG/ACT IN AERO
2.0000 | INHALATION_SPRAY | Freq: Two times a day (BID) | RESPIRATORY_TRACT | 11 refills | Status: DC
Start: 2023-07-20 — End: 2023-08-26

## 2023-07-20 NOTE — Progress Notes (Signed)
Assessment & Plan:   1. Lung nodule (Primary)  High-resolution chest CT is notable for a right upper lobe pulmonary nodule that is progressed compared to prior chest CT in 2016.  I had initially wanted to PET/CT to assess for FDG avidity but this has not been obtained yet given troubles with insurance coverage.  Given she is reestablishing care, I will reorder the PET/CT as well as obtain super D CT of the chest to further evaluate the nodule and prepare for potential biopsy depending on findings on the PET.  - CT SUPER D CHEST WO CONTRAST; Future - NM PET Image Initial (PI) Skull Base To Thigh (F-18 FDG); Future - Pulmonary Function Test [facility] Only; Future  2. Centrilobular emphysema (HCC) 3. Pulmonary fibrosis (HCC)  Initially presented for the evaluation of shortness of breath with findings of subpleural reticulation and emphysema on chest imaging.  She had high-resolution chest CT in July 2024 showing a pattern consistent with UIP. This could represent UIP secondary to IPF but could also be due to chronic hypersensitivity pneumonitis given significant mold exposure. CPFE is also on the differential. Auto-immune workup was essentially non-revealing, with only a very mildly elevated ANA of 1:40 of unclear significance but otherwise negative titers (ANA reflex, CCP, RF, myomarker). TTE does not show any signs of pulmonary hypertension or RV dysfunction. Will continue ICS/LABA/LAMA for management of her COPD and will consider initiation of anti-fibrotic therapy on follow up visit (as we workup the nodule in tandem)  - Budeson-Glycopyrrol-Formoterol (BREZTRI AEROSPHERE) 160-9-4.8 MCG/ACT AERO; Inhale 2 puffs into the lungs 2 (two) times daily.  Dispense: 10.7 g; Refill: 11 - 6 minute walk test   Return in about 2 weeks (around 08/03/2023).  I spent 30 minutes caring for this patient today, including preparing to see the patient, obtaining a medical history , reviewing a separately obtained  history, performing a medically appropriate examination and/or evaluation, counseling and educating the patient/family/caregiver, ordering medications, tests, or procedures, documenting clinical information in the electronic health record, and independently interpreting results (not separately reported/billed) and communicating results to the patient/family/caregiver  Raechel Chute, MD [facility] 07/20/2023 2:57 PM    End of visit medications:  Meds ordered this encounter  Medications   Budeson-Glycopyrrol-Formoterol (BREZTRI AEROSPHERE) 160-9-4.8 MCG/ACT AERO    Sig: Inhale 2 puffs into the lungs 2 (two) times daily.    Dispense:  10.7 g    Refill:  11     Current Outpatient Medications:    acetaminophen (TYLENOL) 500 MG tablet, Take 1,500 mg by mouth 3 (three) times daily. 2 to 3 times a day., Disp: , Rfl:    albuterol (VENTOLIN HFA) 108 (90 Base) MCG/ACT inhaler, Inhale 2 puffs into the lungs every 6 (six) hours as needed for wheezing or shortness of breath., Disp: 8 g, Rfl: 5   baclofen (LIORESAL) 10 MG tablet, TAKE 1/2 TO 1 TABLET(5 TO 10 MG) BY MOUTH THREE TIMES DAILY AS NEEDED FOR MUSCLE SPASMS, Disp: 90 tablet, Rfl: 3   benzonatate (TESSALON) 100 MG capsule, Take 1 capsule (100 mg total) by mouth 3 (three) times daily as needed for cough., Disp: 30 capsule, Rfl: 0   buPROPion (WELLBUTRIN SR) 150 MG 12 hr tablet, TAKE 1 TABLET(150 MG) BY MOUTH TWICE DAILY, Disp: 180 tablet, Rfl: 0   fluticasone (FLONASE) 50 MCG/ACT nasal spray, Place 2 sprays into both nostrils daily., Disp: 16 g, Rfl: 0   gabapentin (NEURONTIN) 300 MG capsule, Take 1 capsule (300 mg  total) by mouth at bedtime., Disp: 90 capsule, Rfl: 1   ipratropium (ATROVENT) 0.03 % nasal spray, Place 2 sprays into both nostrils every 12 (twelve) hours., Disp: 30 mL, Rfl: 0   loratadine (CLARITIN) 10 MG tablet, Take 10 mg by mouth daily., Disp: , Rfl:    moxifloxacin (VIGAMOX) 0.5 % ophthalmic solution, Place  1 drop into the right eye 4 (four) times daily., Disp: , Rfl:    naproxen (NAPROSYN) 500 MG tablet, Take 1 tablet (500 mg total) by mouth 2 (two) times daily with a meal., Disp: 180 tablet, Rfl: 0   nicotine (NICODERM CQ) 14 mg/24hr patch, Place 1 patch (14 mg total) onto the skin daily., Disp: 28 patch, Rfl: 0   nystatin (MYCOSTATIN) 100000 UNIT/ML suspension, Take 5 mLs (500,000 Units total) by mouth 4 (four) times daily., Disp: 60 mL, Rfl: 2   omeprazole (PRILOSEC) 20 MG capsule, Take 20 mg by mouth daily., Disp: , Rfl: 6   potassium chloride SA (KLOR-CON M) 20 MEQ tablet, Take 1 tablet (20 mEq total) by mouth daily., Disp: 7 tablet, Rfl: 0   Budeson-Glycopyrrol-Formoterol (BREZTRI AEROSPHERE) 160-9-4.8 MCG/ACT AERO, Inhale 2 puffs into the lungs 2 (two) times daily., Disp: 10.7 g, Rfl: 11   Subjective:   PATIENT ID: Joy Ball GENDER: female DOB: Aug 21, 1965, MRN: [id number]  Chief Complaint  Patient presents with   Follow-up    Cough, shortness of breath and wheezing.     HPI  Patient is a pleasant 58 year old female presenting to clinic for follow-up.  At our last visit, patient insurance coverage issues and attempted to get her care transitioned over to [facility] but was unsuccessful given prolonged wait time.  She is presenting to reestablish care with [place].  Current symptoms remain stable.  She reports shortness of breath with significant exertion but her cough is much improved.  She successfully quit smoking in September 2024 and has felt significant improvement in the cough.  He denies any chest pain or chest tightness, new rash, or any other new symptoms.  She'd been last seen in 2017 by Dr. Sung Amabile, and prior to that by Dr. Kendrick Fries. She had followed with them for both COPD and pulmonary fibrosis. Previous workup has included an auto-immune workup in 2013 that was negative, as well as an anti-trypsin genotype that was normal. Her chest CT was from 2016 and showed bilateral lower lobe  basilar reticulation. It was also notable for centrilobular emphysema.  Following our visit in June 2024, I ordered a high-resolution chest CT that was obtained outside of our system with images ported and report noting signs of UIP as well as a potential pulmonary nodule on my review.  We were in the process of obtaining a PET/CT to assess for risk of malignancy but this was delayed given insurance coverage and attempt to transition care.  She continues to be compliant with Markus Daft and is using it twice daily.  Today, she reports being smoke-free since September 2024.  She previously smoked between 1.5 and 2 packs a day.  She continues to live in the same house with her boyfriend where there is report of mold in the bedroom (water from under the bedroom).  Ancillary information including prior medications, full medical/surgical/family/social histories, and PFTs (when available) are listed below and have been reviewed.   Review of Systems  Constitutional:  Negative for chills, fever and weight loss.  Respiratory:  Positive for shortness of breath (with exertion). Negative for cough, hemoptysis and wheezing.  Cardiovascular:  Negative for chest pain.     Objective:   Vitals:   07/20/23 1438  BP: 116/80  Pulse: 99  Temp: 97.6 F (36.4 C)  TempSrc: Temporal  SpO2: 97%  Weight: 108 lb (49 kg)  Height: 5\' 1"  (1.549 m)   97% on RA BMI Readings from Last 3 Encounters:  07/20/23 20.41 kg/m  01/12/23 20.26 kg/m  12/11/22 18.54 kg/m   Wt Readings from Last 3 Encounters:  07/20/23 108 lb (49 kg)  01/12/23 107 lb 3.2 oz (48.6 kg)  12/11/22 98 lb 1.6 oz (44.5 kg)    Physical Exam Constitutional:      Appearance: Normal appearance.  Cardiovascular:     Rate and Rhythm: Normal rate and regular rhythm.     Pulses: Normal pulses.     Heart sounds: Normal heart sounds.  Pulmonary:     Breath sounds: Rales (bibasilar, R>L) present. No wheezing or rhonchi.  Abdominal:      Palpations: Abdomen is soft.  Neurological:     General: No focal deficit present.     Mental Status: She is alert and oriented to person, place, and time. Mental status is at baseline.       Ancillary Information    Past Medical History:  Diagnosis Date   Asthma    COPD (chronic obstructive pulmonary disease) (HCC)    Emphysema lung (HCC)    GERD (gastroesophageal reflux disease)    Migraines    Seasonal allergies      Family History  Problem Relation Age of Onset   Lung cancer Father        was a smoker   Heart disease Maternal Grandfather    Emphysema Mother      Past Surgical History:  Procedure Laterality Date   CESAREAN SECTION     TUBAL LIGATION      Social History   Socioeconomic History   Marital status: Widowed    Spouse name: Not on file   Number of children: 3   Years of education: Not on file   Highest education level: Some college, no degree  Occupational History   Occupation: Lexicographer houses    Employer: OTHER  Tobacco Use   Smoking status: Former    Current packs/day: 0.00    Average packs/day: 2.0 packs/day for 39.7 years (79.3 ttl pk-yrs)    Types: Cigarettes    Start date: 53    Quit date: 02/22/2023    Years since quitting: 0.4   Smokeless tobacco: Current   Tobacco comments:    1.5-2PPD 01/12/2023  Substance and Sexual Activity   Alcohol use: Yes    Alcohol/week: 0.0 standard drinks of alcohol    Comment: occ   Drug use: No   Sexual activity: Not on file  Other Topics Concern   Not on file  Social History Narrative   Not on file   Social Drivers of Health   Financial Resource Strain: Low Risk  (11/11/2022)   Overall Financial Resource Strain (CARDIA)    Difficulty of Paying Living Expenses: Not very hard  Food Insecurity: Food Insecurity Present (11/11/2022)   Hunger Vital Sign    Worried About Running Out of Food in the Last Year: Sometimes true    Ran Out of Food in the Last Year: Sometimes true  Transportation Needs: No  Transportation Needs (11/11/2022)   PRAPARE - Administrator, Civil Service (Medical): No    Lack of Transportation (Non-Medical): No  Physical Activity:  Unknown (11/11/2022)   Exercise Vital Sign    Days of Exercise per Week: 0 days    Minutes of Exercise per Session: Not on file  Stress: No Stress Concern Present (11/11/2022)   [employer] of Occupational Health - Occupational Stress Questionnaire    Feeling of Stress : Only a little  Social Connections: Unknown (12/22/2022)   Received from [facility], [facility]   Social Network    Social Network: Not on file  Recent Concern: Social Connections - Moderately Isolated (11/11/2022)   Social Connection and Isolation Panel [NHANES]    Frequency of Communication with Friends and Family: More than three times a week    Frequency of Social Gatherings with Friends and Family: Once a week    Attends Religious Services: Never    Database administrator or Organizations: No    Attends Engineer, structural: Not on file    Marital Status: Living with partner  Intimate Partner Violence: Unknown (12/22/2022)   Received from [employer], [facility]   HITS    Physically Hurt: Not on file    Insult or Talk Down To: Not on file    Threaten Physical Harm: Not on file    Scream or Curse: Not on file     Allergies  Allergen Reactions   Amoxicillin Hives     CBC    Component Value Date/Time   WBC 12.2 (H) 11/20/2022 0933   RBC 3.77 (L) 11/20/2022 0933   HGB 13.8 11/20/2022 0933   HCT 38.9 11/20/2022 0933   PLT 413 (H) 11/20/2022 0933   MCV 103.2 (H) 11/20/2022 0933   MCH 36.6 (H) 11/20/2022 0933   MCHC 35.5 11/20/2022 0933   RDW 13.0 11/20/2022 0933   LYMPHSABS 4,148 (H) 11/20/2022 0933   MONOABS 460 10/29/2016 1225   EOSABS 85 11/20/2022 0933   BASOSABS 73 11/20/2022 0933    Pulmonary Functions Testing Results:    Latest Ref Rng & Units 01/01/2023    2:22 PM  PFT Results  FVC-Pre L 2.15    FVC-Predicted Pre % 69   FVC-Post L 2.37   FVC-Predicted Post % 77   Pre FEV1/FVC % % 66   Post FEV1/FCV % % 71   FEV1-Pre L 1.42   FEV1-Predicted Pre % 58   FEV1-Post L 1.67   DLCO uncorrected ml/min/mmHg 5.35   DLCO UNC% % 28   DLVA Predicted % 33   TLC L 3.17   TLC % Predicted % 68   RV % Predicted % 69     Outpatient Medications Prior to Visit  Medication Sig Dispense Refill   acetaminophen (TYLENOL) 500 MG tablet Take 1,500 mg by mouth 3 (three) times daily. 2 to 3 times a day.     albuterol (VENTOLIN HFA) 108 (90 Base) MCG/ACT inhaler Inhale 2 puffs into the lungs every 6 (six) hours as needed for wheezing or shortness of breath. 8 g 5   baclofen (LIORESAL) 10 MG tablet TAKE 1/2 TO 1 TABLET(5 TO 10 MG) BY MOUTH THREE TIMES DAILY AS NEEDED FOR MUSCLE SPASMS 90 tablet 3   benzonatate (TESSALON) 100 MG capsule Take 1 capsule (100 mg total) by mouth 3 (three) times daily as needed for cough. 30 capsule 0   buPROPion (WELLBUTRIN SR) 150 MG 12 hr tablet TAKE 1 TABLET(150 MG) BY MOUTH TWICE DAILY 180 tablet 0   fluticasone (FLONASE) 50 MCG/ACT nasal spray Place 2 sprays into both nostrils daily. 16 g 0  gabapentin (NEURONTIN) 300 MG capsule Take 1 capsule (300 mg total) by mouth at bedtime. 90 capsule 1   ipratropium (ATROVENT) 0.03 % nasal spray Place 2 sprays into both nostrils every 12 (twelve) hours. 30 mL 0   loratadine (CLARITIN) 10 MG tablet Take 10 mg by mouth daily.     moxifloxacin (VIGAMOX) 0.5 % ophthalmic solution Place 1 drop into the right eye 4 (four) times daily.     naproxen (NAPROSYN) 500 MG tablet Take 1 tablet (500 mg total) by mouth 2 (two) times daily with a meal. 180 tablet 0   nicotine (NICODERM CQ) 14 mg/24hr patch Place 1 patch (14 mg total) onto the skin daily. 28 patch 0   nystatin (MYCOSTATIN) 100000 UNIT/ML suspension Take 5 mLs (500,000 Units total) by mouth 4 (four) times daily. 60 mL 2   omeprazole (PRILOSEC) 20 MG capsule Take 20 mg by mouth daily.   6   potassium chloride SA (KLOR-CON M) 20 MEQ tablet Take 1 tablet (20 mEq total) by mouth daily. 7 tablet 0   Budeson-Glycopyrrol-Formoterol (BREZTRI AEROSPHERE) 160-9-4.8 MCG/ACT AERO Inhale 2 puffs into the lungs 2 (two) times daily. 10.7 g 11   fluticasone (FLOVENT HFA) 220 MCG/ACT inhaler Inhale 1 puff into the lungs in the morning and at bedtime. 1 each 12   No facility-administered medications prior to visit.

## 2023-07-25 DIAGNOSIS — Z419 Encounter for procedure for purposes other than remedying health state, unspecified: Secondary | ICD-10-CM | POA: Diagnosis not present

## 2023-07-29 ENCOUNTER — Other Ambulatory Visit: Payer: BLUE CROSS/BLUE SHIELD

## 2023-07-31 ENCOUNTER — Ambulatory Visit
Admission: RE | Admit: 2023-07-31 | Discharge: 2023-07-31 | Disposition: A | Discharge status: Home / Self Care | Payer: Medicaid Other | Source: Ambulatory Visit | Attending: Student in an Organized Health Care Education/Training Program

## 2023-07-31 ENCOUNTER — Ambulatory Visit
Admission: RE | Admit: 2023-07-31 | Discharge: 2023-07-31 | Disposition: A | Discharge status: Home / Self Care | Payer: Medicaid Other | Source: Ambulatory Visit | Attending: Student in an Organized Health Care Education/Training Program | Admitting: Student in an Organized Health Care Education/Training Program

## 2023-07-31 DIAGNOSIS — R911 Solitary pulmonary nodule: Secondary | ICD-10-CM

## 2023-07-31 DIAGNOSIS — J432 Centrilobular emphysema: Secondary | ICD-10-CM | POA: Diagnosis not present

## 2023-07-31 LAB — GLUCOSE, CAPILLARY: Glucose-Capillary: 83 mg/dL (ref 70–99)

## 2023-07-31 MED ORDER — FLUDEOXYGLUCOSE F - 18 (FDG) INJECTION
5.6000 | Freq: Once | INTRAVENOUS | Status: AC | PRN
  Administered 2023-07-31: 5.77 via INTRAVENOUS

## 2023-08-06 ENCOUNTER — Encounter: Payer: Self-pay | Admitting: Student in an Organized Health Care Education/Training Program

## 2023-08-06 ENCOUNTER — Telehealth: Payer: Self-pay

## 2023-08-06 ENCOUNTER — Ambulatory Visit (INDEPENDENT_AMBULATORY_CARE_PROVIDER_SITE_OTHER): Payer: Medicaid Other | Admitting: Student in an Organized Health Care Education/Training Program

## 2023-08-06 VITALS — BP 118/68 | HR 82 | Temp 97.8°F | Resp 16 | Ht 61.0 in | Wt 109.2 lb

## 2023-08-06 DIAGNOSIS — J8410 Pulmonary fibrosis, unspecified: Secondary | ICD-10-CM | POA: Diagnosis not present

## 2023-08-06 DIAGNOSIS — J432 Centrilobular emphysema: Secondary | ICD-10-CM

## 2023-08-06 DIAGNOSIS — R911 Solitary pulmonary nodule: Secondary | ICD-10-CM

## 2023-08-06 NOTE — Telephone Encounter (Signed)
Robotic Bronchoscopy  08/11/2023 at 10:00am Lung Nodule [id number]  Synetta Fail please see Bronch info.

## 2023-08-06 NOTE — H&P (View-Only) (Signed)
 Synopsis: Referred in *** by Saralyn Pilar *  Assessment & Plan:   1. Lung nodule (Primary)  Nodule Location: Nodule Size Nodule Spiculation: {Black Single:19197::"Yes","No"} Associated Lymphadenopathy Smoking Status (current/former/never) and pack years Extrathoracic cancer > 5 years prior (yes/no) SPN malignancy risk score ([facility] Model): *** %risk of malignancy ECOG: {Black single:19197::"0","1","2","3","4","5"}  The patient is here to discuss their imaging abnormalities which include mass in the RUL that has had interval growth on repeat imaging.  We discussed the importance of diagnosis and staging in lung malignancies, and the approach to obtaining a tissue diagnosis which would include robotic assisted navigational bronchoscopy with endobronchial ultrasound guided sampling.  We also discussed the risks associated with the procedure which include a 2% risk of pneumothorax, infection, bleeding, and nondiagnostic procedure in detail.  I explained that patients typically are able to return home the same day of the procedure, but in rare cases admission to the hospital for observation and treatment is required.  After our discussion, the patient elected to proceed with the procedure  Recommendations:  - Procedural/ Surgical Case Request: ROBOTIC ASSISTED NAVIGATIONAL BRONCHOSCOPY; Future - Ambulatory referral to Pulmonology   No follow-ups on file.  I spent *** minutes caring for this patient today, including {EM billing:[id number]}  Raechel Chute, MD [facility] 08/06/2023 9:35 AM    End of visit medications:  No orders of the defined types were placed in this encounter.    Current Outpatient Medications:    acetaminophen (TYLENOL) 500 MG tablet, Take 1,500 mg by mouth 3 (three) times daily. 2 to 3 times a day., Disp: , Rfl:    albuterol (VENTOLIN HFA) 108 (90 Base) MCG/ACT inhaler, Inhale 2 puffs into the lungs every 6 (six) hours as  needed for wheezing or shortness of breath., Disp: 8 g, Rfl: 5   baclofen (LIORESAL) 10 MG tablet, TAKE 1/2 TO 1 TABLET(5 TO 10 MG) BY MOUTH THREE TIMES DAILY AS NEEDED FOR MUSCLE SPASMS, Disp: 90 tablet, Rfl: 3   benzonatate (TESSALON) 100 MG capsule, Take 1 capsule (100 mg total) by mouth 3 (three) times daily as needed for cough., Disp: 30 capsule, Rfl: 0   Budeson-Glycopyrrol-Formoterol (BREZTRI AEROSPHERE) 160-9-4.8 MCG/ACT AERO, Inhale 2 puffs into the lungs 2 (two) times daily., Disp: 10.7 g, Rfl: 11   buPROPion (WELLBUTRIN SR) 150 MG 12 hr tablet, TAKE 1 TABLET(150 MG) BY MOUTH TWICE DAILY, Disp: 180 tablet, Rfl: 0   fluticasone (FLONASE) 50 MCG/ACT nasal spray, Place 2 sprays into both nostrils daily., Disp: 16 g, Rfl: 0   gabapentin (NEURONTIN) 300 MG capsule, Take 1 capsule (300 mg total) by mouth at bedtime., Disp: 90 capsule, Rfl: 1   ipratropium (ATROVENT) 0.03 % nasal spray, Place 2 sprays into both nostrils every 12 (twelve) hours., Disp: 30 mL, Rfl: 0   loratadine (CLARITIN) 10 MG tablet, Take 10 mg by mouth daily., Disp: , Rfl:    moxifloxacin (VIGAMOX) 0.5 % ophthalmic solution, Place 1 drop into the right eye 4 (four) times daily., Disp: , Rfl:    naproxen (NAPROSYN) 500 MG tablet, Take 1 tablet (500 mg total) by mouth 2 (two) times daily with a meal., Disp: 180 tablet, Rfl: 0   nicotine (NICODERM CQ) 14 mg/24hr patch, Place 1 patch (14 mg total) onto the skin daily., Disp: 28 patch, Rfl: 0   nystatin (MYCOSTATIN) 100000 UNIT/ML suspension, Take 5 mLs (500,000 Units total) by mouth 4 (four) times daily., Disp: 60 mL, Rfl: 2   omeprazole (  PRILOSEC) 20 MG capsule, Take 20 mg by mouth daily., Disp: , Rfl: 6   potassium chloride SA (KLOR-CON M) 20 MEQ tablet, Take 1 tablet (20 mEq total) by mouth daily., Disp: 7 tablet, Rfl: 0   Subjective:   PATIENT ID: Joy Ball GENDER: female DOB: 12-21-1965, MRN: [id number]  No chief complaint on file.   HPI ***  Ancillary information  including prior medications, full medical/surgical/family/social histories, and PFTs (when available) are listed below and have been reviewed.   ROS   Objective:   Vitals:   08/06/23 0906  BP: 118/68  Pulse: 82  Resp: 16  Temp: 97.8 F (36.6 C)  TempSrc: Temporal  SpO2: 99%  Weight: 109 lb 3.2 oz (49.5 kg)  Height: 5\' 1"  (1.549 m)   99% on *** LPM *** RA BMI Readings from Last 3 Encounters:  08/06/23 20.63 kg/m  07/20/23 20.41 kg/m  01/12/23 20.26 kg/m   Wt Readings from Last 3 Encounters:  08/06/23 109 lb 3.2 oz (49.5 kg)  07/20/23 108 lb (49 kg)  01/12/23 107 lb 3.2 oz (48.6 kg)    Physical Exam    Ancillary Information    Past Medical History:  Diagnosis Date   Asthma    COPD (chronic obstructive pulmonary disease) (HCC)    Emphysema lung (HCC)    GERD (gastroesophageal reflux disease)    Migraines    Seasonal allergies      Family History  Problem Relation Age of Onset   Lung cancer Father        was a smoker   Heart disease Maternal Grandfather    Emphysema Mother      Past Surgical History:  Procedure Laterality Date   CESAREAN SECTION     TUBAL LIGATION      Social History   Socioeconomic History   Marital status: Widowed    Spouse name: Not on file   Number of children: 3   Years of education: Not on file   Highest education level: Some college, no degree  Occupational History   Occupation: Lexicographer houses    Employer: OTHER  Tobacco Use   Smoking status: Former    Current packs/day: 0.00    Average packs/day: 2.0 packs/day for 39.7 years (79.3 ttl pk-yrs)    Types: Cigarettes    Start date: 99    Quit date: 02/22/2023    Years since quitting: 0.4   Smokeless tobacco: Current   Tobacco comments:    1.5-2PPD 01/12/2023  Substance and Sexual Activity   Alcohol use: Yes    Alcohol/week: 0.0 standard drinks of alcohol    Comment: occ   Drug use: No   Sexual activity: Not on file  Other Topics Concern   Not on file   Social History Narrative   Not on file   Social Drivers of Health   Financial Resource Strain: Low Risk  (11/11/2022)   Overall Financial Resource Strain (CARDIA)    Difficulty of Paying Living Expenses: Not very hard  Food Insecurity: Food Insecurity Present (11/11/2022)   Hunger Vital Sign    Worried About Running Out of Food in the Last Year: Sometimes true    Ran Out of Food in the Last Year: Sometimes true  Transportation Needs: No Transportation Needs (11/11/2022)   PRAPARE - Administrator, Civil Service (Medical): No    Lack of Transportation (Non-Medical): No  Physical Activity: Unknown (11/11/2022)   Exercise Vital Sign    Days of Exercise  per Week: 0 days    Minutes of Exercise per Session: Not on file  Stress: No Stress Concern Present (11/11/2022)   [employer] of Occupational Health - Occupational Stress Questionnaire    Feeling of Stress : Only a little  Social Connections: Unknown (12/22/2022)   Received from [facility], [facility]   Social Network    Social Network: Not on file  Recent Concern: Social Connections - Moderately Isolated (11/11/2022)   Social Connection and Isolation Panel [NHANES]    Frequency of Communication with Friends and Family: More than three times a week    Frequency of Social Gatherings with Friends and Family: Once a week    Attends Religious Services: Never    Database administrator or Organizations: No    Attends Engineer, structural: Not on file    Marital Status: Living with partner  Intimate Partner Violence: Unknown (12/22/2022)   Received from [employer], [facility]   HITS    Physically Hurt: Not on file    Insult or Talk Down To: Not on file    Threaten Physical Harm: Not on file    Scream or Curse: Not on file     Allergies  Allergen Reactions   Amoxicillin Hives     CBC    Component Value Date/Time   WBC 12.2 (H) 11/20/2022 0933   RBC 3.77 (L) 11/20/2022 0933   HGB 13.8 11/20/2022  0933   HCT 38.9 11/20/2022 0933   PLT 413 (H) 11/20/2022 0933   MCV 103.2 (H) 11/20/2022 0933   MCH 36.6 (H) 11/20/2022 0933   MCHC 35.5 11/20/2022 0933   RDW 13.0 11/20/2022 0933   LYMPHSABS 4,148 (H) 11/20/2022 0933   MONOABS 460 10/29/2016 1225   EOSABS 85 11/20/2022 0933   BASOSABS 73 11/20/2022 0933    Pulmonary Functions Testing Results:    Latest Ref Rng & Units 01/01/2023    2:22 PM  PFT Results  FVC-Pre L 2.15   FVC-Predicted Pre % 69   FVC-Post L 2.37   FVC-Predicted Post % 77   Pre FEV1/FVC % % 66   Post FEV1/FCV % % 71   FEV1-Pre L 1.42   FEV1-Predicted Pre % 58   FEV1-Post L 1.67   DLCO uncorrected ml/min/mmHg 5.35   DLCO UNC% % 28   DLVA Predicted % 33   TLC L 3.17   TLC % Predicted % 68   RV % Predicted % 69     Outpatient Medications Prior to Visit  Medication Sig Dispense Refill   acetaminophen (TYLENOL) 500 MG tablet Take 1,500 mg by mouth 3 (three) times daily. 2 to 3 times a day.     albuterol (VENTOLIN HFA) 108 (90 Base) MCG/ACT inhaler Inhale 2 puffs into the lungs every 6 (six) hours as needed for wheezing or shortness of breath. 8 g 5   baclofen (LIORESAL) 10 MG tablet TAKE 1/2 TO 1 TABLET(5 TO 10 MG) BY MOUTH THREE TIMES DAILY AS NEEDED FOR MUSCLE SPASMS 90 tablet 3   benzonatate (TESSALON) 100 MG capsule Take 1 capsule (100 mg total) by mouth 3 (three) times daily as needed for cough. 30 capsule 0   Budeson-Glycopyrrol-Formoterol (BREZTRI AEROSPHERE) 160-9-4.8 MCG/ACT AERO Inhale 2 puffs into the lungs 2 (two) times daily. 10.7 g 11   buPROPion (WELLBUTRIN SR) 150 MG 12 hr tablet TAKE 1 TABLET(150 MG) BY MOUTH TWICE DAILY 180 tablet 0   fluticasone (FLONASE) 50 MCG/ACT nasal spray Place 2  sprays into both nostrils daily. 16 g 0   gabapentin (NEURONTIN) 300 MG capsule Take 1 capsule (300 mg total) by mouth at bedtime. 90 capsule 1   ipratropium (ATROVENT) 0.03 % nasal spray Place 2 sprays into both nostrils every 12 (twelve) hours. 30 mL 0    loratadine (CLARITIN) 10 MG tablet Take 10 mg by mouth daily.     moxifloxacin (VIGAMOX) 0.5 % ophthalmic solution Place 1 drop into the right eye 4 (four) times daily.     naproxen (NAPROSYN) 500 MG tablet Take 1 tablet (500 mg total) by mouth 2 (two) times daily with a meal. 180 tablet 0   nicotine (NICODERM CQ) 14 mg/24hr patch Place 1 patch (14 mg total) onto the skin daily. 28 patch 0   nystatin (MYCOSTATIN) 100000 UNIT/ML suspension Take 5 mLs (500,000 Units total) by mouth 4 (four) times daily. 60 mL 2   omeprazole (PRILOSEC) 20 MG capsule Take 20 mg by mouth daily.  6   potassium chloride SA (KLOR-CON M) 20 MEQ tablet Take 1 tablet (20 mEq total) by mouth daily. 7 tablet 0   No facility-administered medications prior to visit.

## 2023-08-06 NOTE — Telephone Encounter (Signed)
Noted. Nothing further needed.

## 2023-08-06 NOTE — Telephone Encounter (Signed)
For the code 40981 Prior Auth Not Required Refer # [id number]

## 2023-08-06 NOTE — Telephone Encounter (Signed)
Pre-Admit appt scheduled for 08/10/2023 between 1-5pm.  Patient is aware.

## 2023-08-06 NOTE — Progress Notes (Unsigned)
Synopsis: Referred in *** by Saralyn Pilar *  Assessment & Plan:   1. Lung nodule (Primary)  Nodule Location: Nodule Size Nodule Spiculation: {Black Single:19197::"Yes","No"} Associated Lymphadenopathy Smoking Status (current/former/never) and pack years Extrathoracic cancer > 5 years prior (yes/no) SPN malignancy risk score ([facility] Model): *** %risk of malignancy ECOG: {Black single:19197::"0","1","2","3","4","5"}  The patient is here to discuss their imaging abnormalities which include mass in the RUL that has had interval growth on repeat imaging.  We discussed the importance of diagnosis and staging in lung malignancies, and the approach to obtaining a tissue diagnosis which would include robotic assisted navigational bronchoscopy with endobronchial ultrasound guided sampling.  We also discussed the risks associated with the procedure which include a 2% risk of pneumothorax, infection, bleeding, and nondiagnostic procedure in detail.  I explained that patients typically are able to return home the same day of the procedure, but in rare cases admission to the hospital for observation and treatment is required.  After our discussion, the patient elected to proceed with the procedure  Recommendations:  - Procedural/ Surgical Case Request: ROBOTIC ASSISTED NAVIGATIONAL BRONCHOSCOPY; Future - Ambulatory referral to Pulmonology   No follow-ups on file.  I spent *** minutes caring for this patient today, including {EM billing:[id number]}  Raechel Chute, MD [facility] 08/06/2023 9:35 AM    End of visit medications:  No orders of the defined types were placed in this encounter.    Current Outpatient Medications:    acetaminophen (TYLENOL) 500 MG tablet, Take 1,500 mg by mouth 3 (three) times daily. 2 to 3 times a day., Disp: , Rfl:    albuterol (VENTOLIN HFA) 108 (90 Base) MCG/ACT inhaler, Inhale 2 puffs into the lungs every 6 (six) hours as  needed for wheezing or shortness of breath., Disp: 8 g, Rfl: 5   baclofen (LIORESAL) 10 MG tablet, TAKE 1/2 TO 1 TABLET(5 TO 10 MG) BY MOUTH THREE TIMES DAILY AS NEEDED FOR MUSCLE SPASMS, Disp: 90 tablet, Rfl: 3   benzonatate (TESSALON) 100 MG capsule, Take 1 capsule (100 mg total) by mouth 3 (three) times daily as needed for cough., Disp: 30 capsule, Rfl: 0   Budeson-Glycopyrrol-Formoterol (BREZTRI AEROSPHERE) 160-9-4.8 MCG/ACT AERO, Inhale 2 puffs into the lungs 2 (two) times daily., Disp: 10.7 g, Rfl: 11   buPROPion (WELLBUTRIN SR) 150 MG 12 hr tablet, TAKE 1 TABLET(150 MG) BY MOUTH TWICE DAILY, Disp: 180 tablet, Rfl: 0   fluticasone (FLONASE) 50 MCG/ACT nasal spray, Place 2 sprays into both nostrils daily., Disp: 16 g, Rfl: 0   gabapentin (NEURONTIN) 300 MG capsule, Take 1 capsule (300 mg total) by mouth at bedtime., Disp: 90 capsule, Rfl: 1   ipratropium (ATROVENT) 0.03 % nasal spray, Place 2 sprays into both nostrils every 12 (twelve) hours., Disp: 30 mL, Rfl: 0   loratadine (CLARITIN) 10 MG tablet, Take 10 mg by mouth daily., Disp: , Rfl:    moxifloxacin (VIGAMOX) 0.5 % ophthalmic solution, Place 1 drop into the right eye 4 (four) times daily., Disp: , Rfl:    naproxen (NAPROSYN) 500 MG tablet, Take 1 tablet (500 mg total) by mouth 2 (two) times daily with a meal., Disp: 180 tablet, Rfl: 0   nicotine (NICODERM CQ) 14 mg/24hr patch, Place 1 patch (14 mg total) onto the skin daily., Disp: 28 patch, Rfl: 0   nystatin (MYCOSTATIN) 100000 UNIT/ML suspension, Take 5 mLs (500,000 Units total) by mouth 4 (four) times daily., Disp: 60 mL, Rfl: 2   omeprazole (  PRILOSEC) 20 MG capsule, Take 20 mg by mouth daily., Disp: , Rfl: 6   potassium chloride SA (KLOR-CON M) 20 MEQ tablet, Take 1 tablet (20 mEq total) by mouth daily., Disp: 7 tablet, Rfl: 0   Subjective:   PATIENT ID: Joy Ball GENDER: female DOB: 12-21-1965, MRN: [id number]  No chief complaint on file.   HPI ***  Ancillary information  including prior medications, full medical/surgical/family/social histories, and PFTs (when available) are listed below and have been reviewed.   ROS   Objective:   Vitals:   08/06/23 0906  BP: 118/68  Pulse: 82  Resp: 16  Temp: 97.8 F (36.6 C)  TempSrc: Temporal  SpO2: 99%  Weight: 109 lb 3.2 oz (49.5 kg)  Height: 5\' 1"  (1.549 m)   99% on *** LPM *** RA BMI Readings from Last 3 Encounters:  08/06/23 20.63 kg/m  07/20/23 20.41 kg/m  01/12/23 20.26 kg/m   Wt Readings from Last 3 Encounters:  08/06/23 109 lb 3.2 oz (49.5 kg)  07/20/23 108 lb (49 kg)  01/12/23 107 lb 3.2 oz (48.6 kg)    Physical Exam    Ancillary Information    Past Medical History:  Diagnosis Date   Asthma    COPD (chronic obstructive pulmonary disease) (HCC)    Emphysema lung (HCC)    GERD (gastroesophageal reflux disease)    Migraines    Seasonal allergies      Family History  Problem Relation Age of Onset   Lung cancer Father        was a smoker   Heart disease Maternal Grandfather    Emphysema Mother      Past Surgical History:  Procedure Laterality Date   CESAREAN SECTION     TUBAL LIGATION      Social History   Socioeconomic History   Marital status: Widowed    Spouse name: Not on file   Number of children: 3   Years of education: Not on file   Highest education level: Some college, no degree  Occupational History   Occupation: Lexicographer houses    Employer: OTHER  Tobacco Use   Smoking status: Former    Current packs/day: 0.00    Average packs/day: 2.0 packs/day for 39.7 years (79.3 ttl pk-yrs)    Types: Cigarettes    Start date: 99    Quit date: 02/22/2023    Years since quitting: 0.4   Smokeless tobacco: Current   Tobacco comments:    1.5-2PPD 01/12/2023  Substance and Sexual Activity   Alcohol use: Yes    Alcohol/week: 0.0 standard drinks of alcohol    Comment: occ   Drug use: No   Sexual activity: Not on file  Other Topics Concern   Not on file   Social History Narrative   Not on file   Social Drivers of Health   Financial Resource Strain: Low Risk  (11/11/2022)   Overall Financial Resource Strain (CARDIA)    Difficulty of Paying Living Expenses: Not very hard  Food Insecurity: Food Insecurity Present (11/11/2022)   Hunger Vital Sign    Worried About Running Out of Food in the Last Year: Sometimes true    Ran Out of Food in the Last Year: Sometimes true  Transportation Needs: No Transportation Needs (11/11/2022)   PRAPARE - Administrator, Civil Service (Medical): No    Lack of Transportation (Non-Medical): No  Physical Activity: Unknown (11/11/2022)   Exercise Vital Sign    Days of Exercise  per Week: 0 days    Minutes of Exercise per Session: Not on file  Stress: No Stress Concern Present (11/11/2022)   [employer] of Occupational Health - Occupational Stress Questionnaire    Feeling of Stress : Only a little  Social Connections: Unknown (12/22/2022)   Received from [facility], [facility]   Social Network    Social Network: Not on file  Recent Concern: Social Connections - Moderately Isolated (11/11/2022)   Social Connection and Isolation Panel [NHANES]    Frequency of Communication with Friends and Family: More than three times a week    Frequency of Social Gatherings with Friends and Family: Once a week    Attends Religious Services: Never    Database administrator or Organizations: No    Attends Engineer, structural: Not on file    Marital Status: Living with partner  Intimate Partner Violence: Unknown (12/22/2022)   Received from [employer], [facility]   HITS    Physically Hurt: Not on file    Insult or Talk Down To: Not on file    Threaten Physical Harm: Not on file    Scream or Curse: Not on file     Allergies  Allergen Reactions   Amoxicillin Hives     CBC    Component Value Date/Time   WBC 12.2 (H) 11/20/2022 0933   RBC 3.77 (L) 11/20/2022 0933   HGB 13.8 11/20/2022  0933   HCT 38.9 11/20/2022 0933   PLT 413 (H) 11/20/2022 0933   MCV 103.2 (H) 11/20/2022 0933   MCH 36.6 (H) 11/20/2022 0933   MCHC 35.5 11/20/2022 0933   RDW 13.0 11/20/2022 0933   LYMPHSABS 4,148 (H) 11/20/2022 0933   MONOABS 460 10/29/2016 1225   EOSABS 85 11/20/2022 0933   BASOSABS 73 11/20/2022 0933    Pulmonary Functions Testing Results:    Latest Ref Rng & Units 01/01/2023    2:22 PM  PFT Results  FVC-Pre L 2.15   FVC-Predicted Pre % 69   FVC-Post L 2.37   FVC-Predicted Post % 77   Pre FEV1/FVC % % 66   Post FEV1/FCV % % 71   FEV1-Pre L 1.42   FEV1-Predicted Pre % 58   FEV1-Post L 1.67   DLCO uncorrected ml/min/mmHg 5.35   DLCO UNC% % 28   DLVA Predicted % 33   TLC L 3.17   TLC % Predicted % 68   RV % Predicted % 69     Outpatient Medications Prior to Visit  Medication Sig Dispense Refill   acetaminophen (TYLENOL) 500 MG tablet Take 1,500 mg by mouth 3 (three) times daily. 2 to 3 times a day.     albuterol (VENTOLIN HFA) 108 (90 Base) MCG/ACT inhaler Inhale 2 puffs into the lungs every 6 (six) hours as needed for wheezing or shortness of breath. 8 g 5   baclofen (LIORESAL) 10 MG tablet TAKE 1/2 TO 1 TABLET(5 TO 10 MG) BY MOUTH THREE TIMES DAILY AS NEEDED FOR MUSCLE SPASMS 90 tablet 3   benzonatate (TESSALON) 100 MG capsule Take 1 capsule (100 mg total) by mouth 3 (three) times daily as needed for cough. 30 capsule 0   Budeson-Glycopyrrol-Formoterol (BREZTRI AEROSPHERE) 160-9-4.8 MCG/ACT AERO Inhale 2 puffs into the lungs 2 (two) times daily. 10.7 g 11   buPROPion (WELLBUTRIN SR) 150 MG 12 hr tablet TAKE 1 TABLET(150 MG) BY MOUTH TWICE DAILY 180 tablet 0   fluticasone (FLONASE) 50 MCG/ACT nasal spray Place 2  sprays into both nostrils daily. 16 g 0   gabapentin (NEURONTIN) 300 MG capsule Take 1 capsule (300 mg total) by mouth at bedtime. 90 capsule 1   ipratropium (ATROVENT) 0.03 % nasal spray Place 2 sprays into both nostrils every 12 (twelve) hours. 30 mL 0    loratadine (CLARITIN) 10 MG tablet Take 10 mg by mouth daily.     moxifloxacin (VIGAMOX) 0.5 % ophthalmic solution Place 1 drop into the right eye 4 (four) times daily.     naproxen (NAPROSYN) 500 MG tablet Take 1 tablet (500 mg total) by mouth 2 (two) times daily with a meal. 180 tablet 0   nicotine (NICODERM CQ) 14 mg/24hr patch Place 1 patch (14 mg total) onto the skin daily. 28 patch 0   nystatin (MYCOSTATIN) 100000 UNIT/ML suspension Take 5 mLs (500,000 Units total) by mouth 4 (four) times daily. 60 mL 2   omeprazole (PRILOSEC) 20 MG capsule Take 20 mg by mouth daily.  6   potassium chloride SA (KLOR-CON M) 20 MEQ tablet Take 1 tablet (20 mEq total) by mouth daily. 7 tablet 0   No facility-administered medications prior to visit.

## 2023-08-10 ENCOUNTER — Encounter
Admission: RE | Admit: 2023-08-10 | Discharge: 2023-08-10 | Disposition: A | Discharge status: Home / Self Care | Payer: Medicaid Other | Source: Ambulatory Visit | Attending: Student in an Organized Health Care Education/Training Program | Admitting: Student in an Organized Health Care Education/Training Program

## 2023-08-10 ENCOUNTER — Other Ambulatory Visit: Payer: Self-pay

## 2023-08-10 VITALS — Ht 61.0 in | Wt 109.0 lb

## 2023-08-10 DIAGNOSIS — J984 Other disorders of lung: Secondary | ICD-10-CM

## 2023-08-10 DIAGNOSIS — J4489 Other specified chronic obstructive pulmonary disease: Secondary | ICD-10-CM | POA: Insufficient documentation

## 2023-08-10 DIAGNOSIS — Z01810 Encounter for preprocedural cardiovascular examination: Secondary | ICD-10-CM | POA: Insufficient documentation

## 2023-08-10 DIAGNOSIS — Z01818 Encounter for other preprocedural examination: Secondary | ICD-10-CM | POA: Diagnosis present

## 2023-08-10 HISTORY — DX: Solitary pulmonary nodule: R91.1

## 2023-08-10 HISTORY — DX: Dyspnea, unspecified: R06.00

## 2023-08-10 NOTE — Patient Instructions (Addendum)
Your procedure is scheduled on: Tuesday 08/11/23 Report to the Registration Desk on the 1st floor of the [facility]. To find out your arrival time, please call [phone] between 1PM - 3PM on: Monday 08/10/23  If your arrival time is 6:00 am, do not arrive before that time as the [facility] entrance doors do not open until 6:00 am.  REMEMBER: Instructions that are not followed completely may result in serious medical risk, up to and including death; or upon the discretion of your surgeon and anesthesiologist your surgery may need to be rescheduled.  Do not eat food after midnight the night before surgery.  No gum chewing or hard candies.  You may however, drink CLEAR liquids up to 2 hours before you are scheduled to arrive for your surgery. Do not drink anything within 2 hours of your scheduled arrival time.  Clear liquids include: - water  - apple juice without pulp - gatorade (not RED colors) - black coffee or tea (Do NOT add milk or creamers to the coffee or tea) Do NOT drink anything that is not on this list.  One week prior to surgery: Stop Anti-inflammatories (NSAIDS) such as Advil, Aleve, Ibuprofen, Motrin, Naproxen, Naprosyn and Aspirin based products such as Excedrin, Goody's Powder, BC Powder. Stop ANY OVER THE COUNTER supplements until after surgery.  You may however, continue to take Tylenol if needed for pain up until the day of surgery.   Continue taking all of your other prescription medications up until the day of surgery.  ON THE DAY OF SURGERY ONLY TAKE THESE MEDICATIONS WITH SIPS OF WATER:  omeprazole (PRILOSEC) 20 MG   Use inhalers on the day of surgery and bring to the hospital. albuterol (VENTOLIN HFA) 108 (90 Base) MCG/ACT inhaler  Budeson-Glycopyrrol-Formoterol (BREZTRI AEROSPHERE) 160-9-4.8 MCG/ACT AERO   Fleets enema or bowel prep as directed.  No Alcohol for 24 hours before or after surgery.  No Smoking including e-cigarettes for 24 hours  before surgery.  No chewable tobacco products for at least 6 hours before surgery.  No nicotine patches on the day of surgery.  Do not use any "recreational" drugs for at least a week (preferably 2 weeks) before your surgery.  Please be advised that the combination of cocaine and anesthesia may have negative outcomes, up to and including death. If you test positive for cocaine, your surgery will be cancelled.  On the morning of surgery brush your teeth with toothpaste and water, you may rinse your mouth with mouthwash if you wish. Do not swallow any toothpaste or mouthwash.  Use CHG Soap or wipes as directed on instruction sheet.  Do not wear jewelry, make-up, hairpins, clips or nail polish.  For welded (permanent) jewelry: bracelets, anklets, waist bands, etc.  Please have this removed prior to surgery.  If it is not removed, there is a chance that hospital personnel will need to cut it off on the day of surgery.  Do not wear lotions, powders, or perfumes.   Do not shave body hair from the neck down 48 hours before surgery.  Contact lenses, hearing aids and dentures may not be worn into surgery.  Do not bring valuables to the hospital. [facility] is not responsible for any missing/lost belongings or valuables.    Notify your doctor if there is any change in your medical condition (cold, fever, infection).  Wear comfortable clothing (specific to your surgery type) to the hospital.  After surgery, you can help prevent lung complications by doing  breathing exercises.  Take deep breaths and cough every 1-2 hours. Your doctor may order a device called an Incentive Spirometer to help you take deep breaths. When coughing or sneezing, hold a pillow firmly against your incision with both hands. This is called "splinting." Doing this helps protect your incision. It also decreases belly discomfort.  If you are being admitted to the hospital overnight, leave your suitcase in the car. After  surgery it may be brought to your room.  In case of increased patient census, it may be necessary for you, the patient, to continue your postoperative care in the Same Day Surgery department.  If you are being discharged the day of surgery, you will not be allowed to drive home. You will need a responsible individual to drive you home and stay with you for 24 hours after surgery.   If you are taking public transportation, you will need to have a responsible individual with you.  Please call the Pre-admissions Testing Dept. at [phone] if you have any questions about these instructions.  Surgery Visitation Policy:  Patients having surgery or a procedure may have two visitors.  Children under the age of 44 must have an adult with them who is not the patient.  Temporary Visitor Restrictions Due to increasing cases of flu, RSV and COVID-19: Children ages 37 and under will not be able to visit patients in [facility] hospitals under most circumstances.  Inpatient Visitation:    Visiting hours are 7 a.m. to 8 p.m. Up to four visitors are allowed at one time in a patient room. The visitors may rotate out with other people during the day.  One visitor age 18 or older may stay with the patient overnight and must be in the room by 8 p.m.

## 2023-08-11 ENCOUNTER — Encounter
Admission: RE | Disposition: A | Discharge status: Home / Self Care | Payer: Self-pay | Source: Home / Self Care | Attending: Student in an Organized Health Care Education/Training Program

## 2023-08-11 ENCOUNTER — Ambulatory Visit: Payer: Self-pay | Admitting: Urgent Care

## 2023-08-11 ENCOUNTER — Ambulatory Visit: Payer: Medicaid Other

## 2023-08-11 ENCOUNTER — Other Ambulatory Visit: Payer: Self-pay

## 2023-08-11 ENCOUNTER — Encounter: Payer: Self-pay | Admitting: Student in an Organized Health Care Education/Training Program

## 2023-08-11 ENCOUNTER — Ambulatory Visit
Admission: RE | Admit: 2023-08-11 | Discharge: 2023-08-11 | Disposition: A | Discharge status: Home / Self Care | Payer: Medicaid Other | Attending: Student in an Organized Health Care Education/Training Program | Admitting: Student in an Organized Health Care Education/Training Program

## 2023-08-11 ENCOUNTER — Ambulatory Visit: Payer: Medicaid Other | Admitting: Certified Registered"

## 2023-08-11 DIAGNOSIS — J439 Emphysema, unspecified: Secondary | ICD-10-CM | POA: Diagnosis not present

## 2023-08-11 DIAGNOSIS — R911 Solitary pulmonary nodule: Secondary | ICD-10-CM | POA: Insufficient documentation

## 2023-08-11 DIAGNOSIS — C3411 Malignant neoplasm of upper lobe, right bronchus or lung: Secondary | ICD-10-CM | POA: Diagnosis not present

## 2023-08-11 DIAGNOSIS — J8410 Pulmonary fibrosis, unspecified: Secondary | ICD-10-CM | POA: Insufficient documentation

## 2023-08-11 DIAGNOSIS — R918 Other nonspecific abnormal finding of lung field: Secondary | ICD-10-CM | POA: Diagnosis not present

## 2023-08-11 DIAGNOSIS — J4489 Other specified chronic obstructive pulmonary disease: Secondary | ICD-10-CM | POA: Diagnosis not present

## 2023-08-11 DIAGNOSIS — Z48813 Encounter for surgical aftercare following surgery on the respiratory system: Secondary | ICD-10-CM | POA: Diagnosis not present

## 2023-08-11 DIAGNOSIS — Z7951 Long term (current) use of inhaled steroids: Secondary | ICD-10-CM | POA: Diagnosis not present

## 2023-08-11 DIAGNOSIS — Z87891 Personal history of nicotine dependence: Secondary | ICD-10-CM | POA: Diagnosis not present

## 2023-08-11 DIAGNOSIS — K219 Gastro-esophageal reflux disease without esophagitis: Secondary | ICD-10-CM | POA: Diagnosis not present

## 2023-08-11 DIAGNOSIS — Z801 Family history of malignant neoplasm of trachea, bronchus and lung: Secondary | ICD-10-CM | POA: Diagnosis not present

## 2023-08-11 SURGERY — BRONCHOSCOPY, WITH BIOPSY USING ELECTROMAGNETIC NAVIGATION
Anesthesia: General | Laterality: Bilateral

## 2023-08-11 MED ORDER — ORAL CARE MOUTH RINSE: 15.0000 mL | Freq: Once | OROMUCOSAL | Status: AC

## 2023-08-11 MED ORDER — FENTANYL CITRATE (PF) 100 MCG/2ML IJ SOLN
INTRAMUSCULAR | Status: DC | PRN
  Administered 2023-08-11: 50 ug via INTRAVENOUS

## 2023-08-11 MED ORDER — ONDANSETRON HCL 4 MG/2ML IJ SOLN
INTRAMUSCULAR | Status: AC
  Filled 2023-08-11: qty 2

## 2023-08-11 MED ORDER — DEXAMETHASONE SODIUM PHOSPHATE 10 MG/ML IJ SOLN
INTRAMUSCULAR | Status: AC
  Filled 2023-08-11: qty 1

## 2023-08-11 MED ORDER — PROPOFOL 1000 MG/100ML IV EMUL
INTRAVENOUS | Status: AC
  Filled 2023-08-11: qty 100

## 2023-08-11 MED ORDER — FENTANYL CITRATE (PF) 100 MCG/2ML IJ SOLN: 25.0000 ug | INTRAMUSCULAR | Status: DC | PRN

## 2023-08-11 MED ORDER — SUGAMMADEX SODIUM 200 MG/2ML IV SOLN: INTRAVENOUS | Status: DC | PRN

## 2023-08-11 MED ORDER — LACTATED RINGERS IV SOLN: INTRAVENOUS | Status: DC

## 2023-08-11 MED ORDER — LIDOCAINE HCL (CARDIAC) PF 100 MG/5ML IV SOSY
PREFILLED_SYRINGE | INTRAVENOUS | Status: DC | PRN
  Administered 2023-08-11: 100 mg via INTRAVENOUS

## 2023-08-11 MED ORDER — PROPOFOL 10 MG/ML IV BOLUS
INTRAVENOUS | Status: AC
Start: 2023-08-11 — End: ?
  Filled 2023-08-11: qty 20

## 2023-08-11 MED ORDER — DEXAMETHASONE SODIUM PHOSPHATE 10 MG/ML IJ SOLN
INTRAMUSCULAR | Status: DC | PRN
  Administered 2023-08-11: 10 mg via INTRAVENOUS

## 2023-08-11 MED ORDER — FENTANYL CITRATE (PF) 100 MCG/2ML IJ SOLN
INTRAMUSCULAR | Status: AC
  Filled 2023-08-11: qty 2

## 2023-08-11 MED ORDER — MIDAZOLAM HCL 2 MG/2ML IJ SOLN
INTRAMUSCULAR | Status: DC | PRN
  Administered 2023-08-11: 2 mg via INTRAVENOUS

## 2023-08-11 MED ORDER — CHLORHEXIDINE GLUCONATE 0.12 % MT SOLN
15.0000 mL | Freq: Once | OROMUCOSAL | Status: AC
  Administered 2023-08-11: 15 mL via OROMUCOSAL

## 2023-08-11 MED ORDER — ONDANSETRON HCL 4 MG/2ML IJ SOLN
INTRAMUSCULAR | Status: DC | PRN
  Administered 2023-08-11: 4 mg via INTRAVENOUS

## 2023-08-11 MED ORDER — ACETAMINOPHEN 10 MG/ML IV SOLN: 1000.0000 mg | Freq: Once | INTRAVENOUS | Status: DC | PRN

## 2023-08-11 MED ORDER — OXYCODONE HCL 5 MG/5ML PO SOLN: 5.0000 mg | Freq: Once | ORAL | Status: DC | PRN

## 2023-08-11 MED ORDER — MIDAZOLAM HCL 2 MG/2ML IJ SOLN
INTRAMUSCULAR | Status: AC
  Filled 2023-08-11: qty 2

## 2023-08-11 MED ORDER — CHLORHEXIDINE GLUCONATE 0.12 % MT SOLN
OROMUCOSAL | Status: AC
  Filled 2023-08-11: qty 15

## 2023-08-11 MED ORDER — DROPERIDOL 2.5 MG/ML IJ SOLN: 0.6250 mg | Freq: Once | INTRAMUSCULAR | Status: DC | PRN

## 2023-08-11 MED ORDER — OXYCODONE HCL 5 MG PO TABS: 5.0000 mg | ORAL_TABLET | Freq: Once | ORAL | Status: DC | PRN

## 2023-08-11 MED ORDER — ROCURONIUM BROMIDE 10 MG/ML (PF) SYRINGE
PREFILLED_SYRINGE | INTRAVENOUS | Status: AC
  Filled 2023-08-11: qty 10

## 2023-08-11 MED ORDER — ROCURONIUM BROMIDE 100 MG/10ML IV SOLN
INTRAVENOUS | Status: DC | PRN
  Administered 2023-08-11: 50 mg via INTRAVENOUS

## 2023-08-11 MED ORDER — LIDOCAINE HCL (PF) 2 % IJ SOLN
INTRAMUSCULAR | Status: AC
  Filled 2023-08-11: qty 5

## 2023-08-11 MED ORDER — SUGAMMADEX SODIUM 200 MG/2ML IV SOLN
INTRAVENOUS | Status: DC | PRN
  Administered 2023-08-11: 200 mg via INTRAVENOUS

## 2023-08-11 MED ORDER — PHENYLEPHRINE HCL-NACL 20-0.9 MG/250ML-% IV SOLN
INTRAVENOUS | Status: DC | PRN
Start: 2023-08-11 — End: 2023-08-11
  Administered 2023-08-11: 40 ug/min via INTRAVENOUS

## 2023-08-11 MED ORDER — PROPOFOL 10 MG/ML IV BOLUS
INTRAVENOUS | Status: DC | PRN
  Administered 2023-08-11: 170 ug/kg/min via INTRAVENOUS
  Administered 2023-08-11: 90 mg via INTRAVENOUS

## 2023-08-11 NOTE — Anesthesia Preprocedure Evaluation (Addendum)
Anesthesia Evaluation  Patient identified by MRN, date of birth, ID band Patient awake    Reviewed: Allergy & Precautions, H&P , NPO status , Patient's Chart, lab work & pertinent test results  Airway Mallampati: III  TM Distance: >3 FB Neck ROM: full    Dental  (+) Edentulous Lower, Edentulous Upper   Pulmonary shortness of breath and with exertion, asthma , COPD, former smoker Pulmonary fibrosis (HCC) Pulmonary nodule, left     Pulmonary exam normal        Cardiovascular Exercise Tolerance: Good + DOE  Normal cardiovascular exam  8/24: Summary 1. The left ventricle is normal in size with normal wall thickness. 2. The left ventricular systolic function is normal, LVEF is visually estimated at 55-60%. 3. The right ventricle is normal in size, with normal systolic function. 4. There is mild aortic regurgitation. 5. The pulmonary systolic pressure cannot be estimated due to insufficient TR signal. 6. There is no pericardial effusion.     Neuro/Psych  Headaches  negative psych ROS   GI/Hepatic negative GI ROS, Neg liver ROS,,,  Endo/Other  negative endocrine ROS    Renal/GU      Musculoskeletal   Abdominal Normal abdominal exam  (+)   Peds  Hematology negative hematology ROS (+)   Anesthesia Other Findings Past Medical History: No date: Asthma No date: COPD (chronic obstructive pulmonary disease) (HCC) No date: Dyspnea No date: Emphysema lung (HCC) No date: GERD (gastroesophageal reflux disease) No date: Lung nodule No date: Migraines No date: Seasonal allergies  Past Surgical History: No date: CESAREAN SECTION 01/2023: EYE SURGERY; Bilateral No date: TUBAL LIGATION  BMI    Body Mass Index: 20.60 kg/m      Reproductive/Obstetrics negative OB ROS                              Anesthesia Physical Anesthesia Plan  ASA: 3  Anesthesia Plan: General ETT   Post-op Pain  Management: Minimal or no pain anticipated   Induction: Intravenous  PONV Risk Score and Plan: 2 and Ondansetron, Dexamethasone and Midazolam  Airway Management Planned: Oral ETT  Additional Equipment:   Intra-op Plan:   Post-operative Plan: Extubation in OR  Informed Consent: I have reviewed the patients History and Physical, chart, labs and discussed the procedure including the risks, benefits and alternatives for the proposed anesthesia with the patient or authorized representative who has indicated his/her understanding and acceptance.     Dental Advisory Given  Plan Discussed with: CRNA and Surgeon  Anesthesia Plan Comments:          Anesthesia Quick Evaluation

## 2023-08-11 NOTE — Op Note (Addendum)
Video Bronchoscopy with Robotic Assisted Bronchoscopic Navigation   Date of Operation: 08/11/2023   Pre-op Diagnosis: lung nodule  Surgeon: Raechel Chute, MD  Anesthesia: General endotracheal anesthesia  Operation: Flexible video fiberoptic bronchoscopy with robotic assistance and biopsies.  Estimated Blood Loss: Minimal  Complications: None  Indications and History: Joy Ball is a 58 y.o. female with history of lung nodule presenting for biopsy. The risks, benefits, complications, treatment options and expected outcomes were discussed with the patient.  The possibilities of pneumothorax, pneumonia, reaction to medication, pulmonary aspiration, perforation of a viscus, bleeding, failure to diagnose a condition and creating a complication requiring transfusion or operation were discussed with the patient who freely signed the consent.    Description of Procedure: The patient was seen in the Preoperative Area, was examined and was deemed appropriate to proceed.  The patient was taken to [facility] procedure room, identified as Joy Ball and the procedure verified as Flexible Video Fiberoptic Bronchoscopy.  A Time Out was held and the above information confirmed.   Prior to the date of the procedure a high-resolution CT scan of the chest was performed. Utilizing ION software program a virtual tracheobronchial tree was generated to allow the creation of distinct navigation pathways to the patient's parenchymal abnormalities. After being taken to the operating room general anesthesia was initiated and the patient  was orally intubated. The video fiberoptic bronchoscope was introduced via the endotracheal tube and a general inspection was performed which showed normal right and left lung anatomy, aspiration of the bilateral mainstems was completed to remove any remaining secretions. Robotic catheter inserted into patient's endotracheal tube.  Target #1 RUL nodule: The distinct navigation  pathways prepared prior to this procedure were then utilized to navigate to patient's lesion identified on CT scan. The robotic catheter was secured into place and the vision probe was withdrawn.  Lesion location was approximated using fluoroscopy and Mcilrath beam CT (GE 3D OEC) for CT to body divergence correction. Under fluoroscopic guidance transbronchial brushings, transbronchial needle biopsies, and transbronchial forceps biopsies were performed to be sent for cytology and pathology. Tool in lesion confirmed with Mckeough beam CT. A bronchioalveolar lavage was performed in the RUL and sent for cytology.    EBUS:  We then turned our attention to the EBUS portion of the procedure. The EBUS bronchoscope was introduced and the hilar and mediastinal lymph node stations were evaluated. LN biopsy was obtained with a 21G Olympus ViziShot 2 Needle at stations 11L (5 passes), 7 (3 passes), 4R (3 passes) and 11R superior (3 passes).  At the end of the procedure a general airway inspection was performed and there was no evidence of active bleeding. The bronchoscope was removed.  The patient tolerated the procedure well. There was no significant blood loss and there were no obvious complications. A post-procedural chest x-ray is pending.  Samples Target #1: 1. Transbronchial brushings from RUL nodule 2. Transbronchial Wang needle biopsies from RUL Nodule 3. Transbronchial forceps biopsies from RUL nodule 4. Bronchoalveolar lavage from RUL   EBUS to stations 11L, 7, 4R and 11R superior  Plans:  The patient will be discharged from the PACU to home when recovered from anesthesia and after chest x-ray is reviewed. We will review the cytology, pathology and microbiology results with the patient when they become available. Outpatient followup will be with myself.  Raechel Chute, MD [place] [place] [facility] 08/11/2023 5:07 PM

## 2023-08-11 NOTE — Anesthesia Procedure Notes (Signed)
Procedure Name: Intubation Date/Time: 08/11/2023 3:30 PM  Performed by: Morene Crocker, CRNAPre-anesthesia Checklist: Patient identified, Patient being monitored, Timeout performed, Emergency Drugs available and Suction available Patient Re-evaluated:Patient Re-evaluated prior to induction Oxygen Delivery Method: Circle system utilized Preoxygenation: Pre-oxygenation with 100% oxygen Induction Type: IV induction Ventilation: Mask ventilation without difficulty Laryngoscope Size: 3 and McGrath Grade View: Grade I Tube type: Oral Tube size: 8.5 mm Number of attempts: 1 Airway Equipment and Method: Stylet Placement Confirmation: ETT inserted through vocal cords under direct vision, positive ETCO2 and breath sounds checked- equal and bilateral Secured at: 21 cm Tube secured with: Tape Dental Injury: Teeth and Oropharynx as per pre-operative assessment  Comments: Smooth atraumatic intubation, no complications noted.

## 2023-08-11 NOTE — Transfer of Care (Signed)
Immediate Anesthesia Transfer of Care Note  Patient: Joy Ball  Procedure(s) Performed: ROBOTIC ASSISTED NAVIGATIONAL BRONCHOSCOPY (Bilateral)  Patient Location: PACU  Anesthesia Type:General  Level of Consciousness: awake, alert , and oriented  Airway & Oxygen Therapy: Patient Spontanous Breathing  Post-op Assessment: Report given to RN and Post -op Vital signs reviewed and stable  Post vital signs: Reviewed and stable  Last Vitals:  Vitals Value Taken Time  BP 121/88 08/11/23 1705  Temp 36 C 08/11/23 1705  Pulse 88 08/11/23 1706  Resp 22 08/11/23 1706  SpO2 100 % 08/11/23 1706  Vitals shown include unfiled device data.  Last Pain:  Vitals:   08/11/23 1330  PainSc: 0-No pain         Complications: No notable events documented.

## 2023-08-11 NOTE — Interval H&P Note (Signed)
S: feels well, no new symptoms, no chest pain, NPO O:  Vitals:   08/11/23 1330  BP: 120/76  Pulse: 94  Resp: 18  Temp: 98.8 F (37.1 C)  SpO2: 97%   General: Well-appearing and in no distress. Well-nourished Eyes: Anicteric, no conjunctival pallor HEENT: Mucous membranes moist, no evidence of postnasal drip Respiratory: Trachea is midline, no respiratory distress, good bilateral air entry, bibasilar rales noted Cardiovascular: Heart with regular rate and rhythm, normal S1 and S2 Gastrointestinal: soft and non tender Neuro: Alert and oriented, no gross focal deficits  A/P: 58 year old female with history of ILD found to have a RUL nodule presenting for biopsy. Plan for robotic assisted navigational bronchoscopy and EBUS with TBNA to the hilar and mediastinal lymph nodes. Patient is appropriate for the procedure.  Raechel Chute, MD [place] [facility] 08/11/2023 2:47 PM

## 2023-08-12 NOTE — Anesthesia Postprocedure Evaluation (Signed)
Anesthesia Post Note  Patient: Joy Ball  Procedure(s) Performed: ROBOTIC ASSISTED NAVIGATIONAL BRONCHOSCOPY (Bilateral)  Patient location during evaluation: PACU Anesthesia Type: General Level of consciousness: awake and alert Pain management: pain level controlled Vital Signs Assessment: post-procedure vital signs reviewed and stable Respiratory status: spontaneous breathing, nonlabored ventilation and respiratory function stable Cardiovascular status: blood pressure returned to baseline and stable Postop Assessment: no apparent nausea or vomiting Anesthetic complications: no   No notable events documented.   Last Vitals:  Vitals:   08/11/23 1739 08/11/23 1751  BP:  121/81  Pulse: 78 88  Resp: (!) 26 18  Temp:  36.9 C  SpO2: 98% 94%    Last Pain:  Vitals:   08/11/23 1751  TempSrc: Temporal  PainSc: 0-No pain                 Foye Deer

## 2023-08-13 LAB — CYTOLOGY - NON PAP

## 2023-08-14 LAB — SURGICAL PATHOLOGY

## 2023-08-18 ENCOUNTER — Ambulatory Visit (INDEPENDENT_AMBULATORY_CARE_PROVIDER_SITE_OTHER): Payer: Medicaid Other | Admitting: Student in an Organized Health Care Education/Training Program

## 2023-08-18 ENCOUNTER — Encounter: Payer: Self-pay | Admitting: Student in an Organized Health Care Education/Training Program

## 2023-08-18 VITALS — BP 110/72 | HR 83 | Temp 96.9°F | Ht 61.0 in | Wt 107.8 lb

## 2023-08-18 DIAGNOSIS — J8410 Pulmonary fibrosis, unspecified: Secondary | ICD-10-CM | POA: Diagnosis not present

## 2023-08-18 DIAGNOSIS — C3491 Malignant neoplasm of unspecified part of right bronchus or lung: Secondary | ICD-10-CM | POA: Diagnosis not present

## 2023-08-18 DIAGNOSIS — J432 Centrilobular emphysema: Secondary | ICD-10-CM | POA: Diagnosis not present

## 2023-08-18 NOTE — Progress Notes (Signed)
 Assessment & Plan:   #Primary adenocarcinoma of right lung (Stage IA2)  She is presenting for the evaluation of a right upper lobe pulmonary nodule measuring 1.5 cm with associated spiculation and FDG avidity.  This was biopsied last week with pathology showing adenocarcinoma.  EBUS to station 11L, 7, 4R, and 11R superior was negative for malignancy.  This is overall consistent with stage I-A2 (T1b, N0, M0) lung cancer. I will place a referral to oncology as well as to thoracic surgery for consideration of resection. Her DLCo was quite low on PFT's from last year, but we will obtain a repeat prior to visit with thoracic surgery.  - Ambulatory referral to Cardiothoracic Surgery - Ambulatory referral to Hematology / Oncology - Pulmonary Function Test [facility] Only; Future  #Centrilobular Emphysema #Pulmonary Fibrosis  Initially presented for the evaluation of shortness of breath with findings of subpleural reticulation and emphysema on chest imaging.  She had high-resolution chest CT in July 2024 showing a pattern consistent with UIP. This could represent UIP secondary to IPF but could also be due to chronic hypersensitivity pneumonitis given significant mold exposure. CPFE is also on the differential.   Auto-immune workup was essentially non-revealing, with only a very mildly elevated ANA of 1:40 of unclear significance but otherwise negative titers (ANA reflex, CCP, RF, myomarker). TTE does not show any signs of pulmonary hypertension or RV dysfunction. Will continue ICS/LABA/LAMA for management of her COPD and will consider initiation of anti-fibrotic therapy after evaluation for lung malignancy. If she were to get a pulmonary resection, will review pathology to evaluate etiology of fibrosis.  -continue ICS/LABA/LAMA -will evaluate lung pathology if gets resection -will decide on anti-fibrotic therapy on follow up    Return in about 3 months (around 11/15/2023).  I spent 30 minutes caring  for this patient today, including preparing to see the patient, obtaining a medical history , reviewing a separately obtained history, performing a medically appropriate examination and/or evaluation, counseling and educating the patient/family/caregiver, ordering medications, tests, or procedures, documenting clinical information in the electronic health record, and independently interpreting results (not separately reported/billed) and communicating results to the patient/family/caregiver  Raechel Chute, MD [facility] 08/18/2023 12:18 PM    End of visit medications:  No orders of the defined types were placed in this encounter.    Current Outpatient Medications:    acetaminophen (TYLENOL) 500 MG tablet, Take 1,500 mg by mouth 3 (three) times daily. 2 to 3 times a day., Disp: , Rfl:    albuterol (VENTOLIN HFA) 108 (90 Base) MCG/ACT inhaler, Inhale 2 puffs into the lungs every 6 (six) hours as needed for wheezing or shortness of breath., Disp: 8 g, Rfl: 5   benzonatate (TESSALON) 100 MG capsule, Take 1 capsule (100 mg total) by mouth 3 (three) times daily as needed for cough., Disp: 30 capsule, Rfl: 0   Budeson-Glycopyrrol-Formoterol (BREZTRI AEROSPHERE) 160-9-4.8 MCG/ACT AERO, Inhale 2 puffs into the lungs 2 (two) times daily., Disp: 10.7 g, Rfl: 11   buPROPion (WELLBUTRIN SR) 150 MG 12 hr tablet, TAKE 1 TABLET(150 MG) BY MOUTH TWICE DAILY, Disp: 180 tablet, Rfl: 0   fluticasone (FLONASE) 50 MCG/ACT nasal spray, Place 2 sprays into both nostrils daily., Disp: 16 g, Rfl: 0   ipratropium (ATROVENT) 0.03 % nasal spray, Place 2 sprays into both nostrils every 12 (twelve) hours., Disp: 30 mL, Rfl: 0   loratadine (CLARITIN) 10 MG tablet, Take 10 mg by mouth daily., Disp: , Rfl:  moxifloxacin (VIGAMOX) 0.5 % ophthalmic solution, Place 1 drop into the right eye 4 (four) times daily., Disp: , Rfl:    naproxen (NAPROSYN) 500 MG tablet, Take 1 tablet (500 mg total) by mouth 2 (two)  times daily with a meal., Disp: 180 tablet, Rfl: 0   nystatin (MYCOSTATIN) 100000 UNIT/ML suspension, Take 5 mLs (500,000 Units total) by mouth 4 (four) times daily., Disp: 60 mL, Rfl: 2   omeprazole (PRILOSEC) 20 MG capsule, Take 20 mg by mouth daily., Disp: , Rfl: 6   prednisoLONE acetate (PRED FORTE) 1 % ophthalmic suspension, Place 1 drop into the right eye 4 (four) times daily., Disp: , Rfl:    baclofen (LIORESAL) 10 MG tablet, TAKE 1/2 TO 1 TABLET(5 TO 10 MG) BY MOUTH THREE TIMES DAILY AS NEEDED FOR MUSCLE SPASMS (Patient not taking: No sig reported), Disp: 90 tablet, Rfl: 3   gabapentin (NEURONTIN) 300 MG capsule, Take 1 capsule (300 mg total) by mouth at bedtime. (Patient not taking: Reported on 08/10/2023), Disp: 90 capsule, Rfl: 1   nicotine (NICODERM CQ) 14 mg/24hr patch, Place 1 patch (14 mg total) onto the skin daily. (Patient not taking: Reported on 08/10/2023), Disp: 28 patch, Rfl: 0   potassium chloride SA (KLOR-CON M) 20 MEQ tablet, Take 1 tablet (20 mEq total) by mouth daily. (Patient not taking: Reported on 08/10/2023), Disp: 7 tablet, Rfl: 0   Subjective:   PATIENT ID: Joy Ball GENDER: female DOB: 1966-01-24, MRN: [id number]  Chief Complaint  Patient presents with   Follow-up    SOB this morning. No wheezing. Cough with brownish sputum.    HPI  Patient is a pleasant 58 year old female presenting to clinic for follow-up.  Since our last visit, she's underwent robotic assisted navigational bronchoscopy for biopsy of the RUL nodule in addition to EBUS/TBNA of the hilar and mediastinal lymph nodes on 08/11/2023. Biopsy of the RUL nodule has shown adenocarcinoma, while EBUS to stations 11L, 7, 4R, and 11R superior was negative for malignancy.  Symptoms are unchanged compared to prior, and she continues to experience shortness of breath with exertion as well as cough. She quit smoking 02/2023. She denies chest pain, tightness, wheeze, fevers, chills, or night sweats.   I met  Joy Ball in January of 2025 after she re-established care with [place] following insurance coverage change and inability to transition care over to [facility]. I ordered repeat imaging including a PET/CT and repeat chest CT showing growth in the RUL nodule as well as FDG avidity on PET.    She'd been seen in 2017 by Dr. Sung Amabile, and prior to that by Dr. Kendrick Fries. She had followed with them for both COPD and pulmonary fibrosis. Previous workup has included an auto-immune workup in 2013 that was negative, as well as an anti-trypsin genotype that was normal. Her chest CT was from 2016 and showed bilateral lower lobe basilar reticulation. It was also notable for centrilobular emphysema.   Following our visit in June 2024, I ordered a high-resolution chest CT that was obtained outside of our system with images ported and report noting signs of UIP as well as a potential pulmonary nodule on my review.  We were in the process of obtaining a PET/CT to assess for risk of malignancy but this was delayed given insurance coverage and attempt to transition care.  She continues to be compliant with Markus Daft and is using it twice daily.   Today, she is smoke-free since September 2024.  She previously smoked between 1.5  and 2 packs a day.  She continues to live in the same house with her boyfriend where there is report of mold in the bedroom (water from under the bedroom).  Ancillary information including prior medications, full medical/surgical/family/social histories, and PFTs (when available) are listed below and have been reviewed.   Review of Systems  Constitutional:  Negative for chills, fever and weight loss.  Respiratory:  Positive for shortness of breath (with exertion). Negative for cough, hemoptysis and wheezing.   Cardiovascular:  Negative for chest pain.     Objective:   Vitals:   08/18/23 1144  BP: 110/72  Pulse: 83  Temp: (!) 96.9 F (36.1 C)  SpO2: 95%  Weight: 107 lb 12.8 oz (48.9 kg)  Height: 5\' 1"   (1.549 m)   95% on RA BMI Readings from Last 3 Encounters:  08/18/23 20.37 kg/m  08/11/23 20.60 kg/m  08/10/23 20.60 kg/m   Wt Readings from Last 3 Encounters:  08/18/23 107 lb 12.8 oz (48.9 kg)  08/11/23 109 lb (49.4 kg)  08/10/23 109 lb (49.4 kg)    Physical Exam Constitutional:      Appearance: Normal appearance.  Cardiovascular:     Rate and Rhythm: Normal rate and regular rhythm.     Pulses: Normal pulses.     Heart sounds: Normal heart sounds.  Pulmonary:     Breath sounds: Rales (bibasilar, R>L) present. No wheezing or rhonchi.  Abdominal:     Palpations: Abdomen is soft.  Neurological:     General: No focal deficit present.     Mental Status: She is alert and oriented to person, place, and time. Mental status is at baseline.       Ancillary Information    Past Medical History:  Diagnosis Date   Asthma    COPD (chronic obstructive pulmonary disease) (HCC)    Dyspnea    Emphysema lung (HCC)    GERD (gastroesophageal reflux disease)    Lung nodule    Migraines    Seasonal allergies      Family History  Problem Relation Age of Onset   Lung cancer Father        was a smoker   Heart disease Maternal Grandfather    Emphysema Mother      Past Surgical History:  Procedure Laterality Date   CESAREAN SECTION     EYE SURGERY Bilateral 01/2023   TUBAL LIGATION      Social History   Socioeconomic History   Marital status: Widowed    Spouse name: Not on file   Number of children: 3   Years of education: Not on file   Highest education level: Some college, no degree  Occupational History   Occupation: Lexicographer houses    Employer: OTHER  Tobacco Use   Smoking status: Former    Current packs/day: 0.00    Average packs/day: 2.0 packs/day for 39.7 years (79.3 ttl pk-yrs)    Types: Cigarettes    Start date: 4    Quit date: 02/22/2023    Years since quitting: 0.4   Smokeless tobacco: Current  Substance and Sexual Activity   Alcohol use: Yes     Alcohol/week: 0.0 standard drinks of alcohol    Comment: occ   Drug use: No   Sexual activity: Not on file  Other Topics Concern   Not on file  Social History Narrative   Not on file   Social Drivers of Health   Financial Resource Strain: Low Risk  (11/11/2022)  Overall Financial Resource Strain (CARDIA)    Difficulty of Paying Living Expenses: Not very hard  Food Insecurity: Food Insecurity Present (11/11/2022)   Hunger Vital Sign    Worried About Running Out of Food in the Last Year: Sometimes true    Ran Out of Food in the Last Year: Sometimes true  Transportation Needs: No Transportation Needs (11/11/2022)   PRAPARE - Administrator, Civil Service (Medical): No    Lack of Transportation (Non-Medical): No  Physical Activity: Unknown (11/11/2022)   Exercise Vital Sign    Days of Exercise per Week: 0 days    Minutes of Exercise per Session: Not on file  Stress: No Stress Concern Present (11/11/2022)   [employer] of Occupational Health - Occupational Stress Questionnaire    Feeling of Stress : Only a little  Social Connections: Unknown (12/22/2022)   Received from [facility], [facility]   Social Network    Social Network: Not on file  Recent Concern: Social Connections - Moderately Isolated (11/11/2022)   Social Connection and Isolation Panel [NHANES]    Frequency of Communication with Friends and Family: More than three times a week    Frequency of Social Gatherings with Friends and Family: Once a week    Attends Religious Services: Never    Database administrator or Organizations: No    Attends Engineer, structural: Not on file    Marital Status: Living with partner  Intimate Partner Violence: Unknown (12/22/2022)   Received from [employer], [facility]   HITS    Physically Hurt: Not on file    Insult or Talk Down To: Not on file    Threaten Physical Harm: Not on file    Scream or Curse: Not on file     Allergies  Allergen Reactions    Amoxicillin Hives     CBC    Component Value Date/Time   WBC 12.2 (H) 11/20/2022 0933   RBC 3.77 (L) 11/20/2022 0933   HGB 13.8 11/20/2022 0933   HCT 38.9 11/20/2022 0933   PLT 413 (H) 11/20/2022 0933   MCV 103.2 (H) 11/20/2022 0933   MCH 36.6 (H) 11/20/2022 0933   MCHC 35.5 11/20/2022 0933   RDW 13.0 11/20/2022 0933   LYMPHSABS 4,148 (H) 11/20/2022 0933   MONOABS 460 10/29/2016 1225   EOSABS 85 11/20/2022 0933   BASOSABS 73 11/20/2022 0933    Pulmonary Functions Testing Results:    Latest Ref Rng & Units 01/01/2023    2:22 PM  PFT Results  FVC-Pre L 2.15   FVC-Predicted Pre % 69   FVC-Post L 2.37   FVC-Predicted Post % 77   Pre FEV1/FVC % % 66   Post FEV1/FCV % % 71   FEV1-Pre L 1.42   FEV1-Predicted Pre % 58   FEV1-Post L 1.67   DLCO uncorrected ml/min/mmHg 5.35   DLCO UNC% % 28   DLVA Predicted % 33   TLC L 3.17   TLC % Predicted % 68   RV % Predicted % 69     Outpatient Medications Prior to Visit  Medication Sig Dispense Refill   acetaminophen (TYLENOL) 500 MG tablet Take 1,500 mg by mouth 3 (three) times daily. 2 to 3 times a day.     albuterol (VENTOLIN HFA) 108 (90 Base) MCG/ACT inhaler Inhale 2 puffs into the lungs every 6 (six) hours as needed for wheezing or shortness of breath. 8 g 5   benzonatate (TESSALON) 100 MG  capsule Take 1 capsule (100 mg total) by mouth 3 (three) times daily as needed for cough. 30 capsule 0   Budeson-Glycopyrrol-Formoterol (BREZTRI AEROSPHERE) 160-9-4.8 MCG/ACT AERO Inhale 2 puffs into the lungs 2 (two) times daily. 10.7 g 11   buPROPion (WELLBUTRIN SR) 150 MG 12 hr tablet TAKE 1 TABLET(150 MG) BY MOUTH TWICE DAILY 180 tablet 0   fluticasone (FLONASE) 50 MCG/ACT nasal spray Place 2 sprays into both nostrils daily. 16 g 0   ipratropium (ATROVENT) 0.03 % nasal spray Place 2 sprays into both nostrils every 12 (twelve) hours. 30 mL 0   loratadine (CLARITIN) 10 MG tablet Take 10 mg by mouth daily.     moxifloxacin (VIGAMOX) 0.5 %  ophthalmic solution Place 1 drop into the right eye 4 (four) times daily.     naproxen (NAPROSYN) 500 MG tablet Take 1 tablet (500 mg total) by mouth 2 (two) times daily with a meal. 180 tablet 0   nystatin (MYCOSTATIN) 100000 UNIT/ML suspension Take 5 mLs (500,000 Units total) by mouth 4 (four) times daily. 60 mL 2   omeprazole (PRILOSEC) 20 MG capsule Take 20 mg by mouth daily.  6   prednisoLONE acetate (PRED FORTE) 1 % ophthalmic suspension Place 1 drop into the right eye 4 (four) times daily.     baclofen (LIORESAL) 10 MG tablet TAKE 1/2 TO 1 TABLET(5 TO 10 MG) BY MOUTH THREE TIMES DAILY AS NEEDED FOR MUSCLE SPASMS (Patient not taking: No sig reported) 90 tablet 3   gabapentin (NEURONTIN) 300 MG capsule Take 1 capsule (300 mg total) by mouth at bedtime. (Patient not taking: Reported on 08/10/2023) 90 capsule 1   nicotine (NICODERM CQ) 14 mg/24hr patch Place 1 patch (14 mg total) onto the skin daily. (Patient not taking: Reported on 08/10/2023) 28 patch 0   potassium chloride SA (KLOR-CON M) 20 MEQ tablet Take 1 tablet (20 mEq total) by mouth daily. (Patient not taking: Reported on 08/10/2023) 7 tablet 0   No facility-administered medications prior to visit.

## 2023-08-19 ENCOUNTER — Inpatient Hospital Stay: Payer: Medicaid Other

## 2023-08-19 ENCOUNTER — Inpatient Hospital Stay: Payer: Medicaid Other | Attending: Oncology | Admitting: Oncology

## 2023-08-19 ENCOUNTER — Encounter: Payer: Self-pay | Admitting: Oncology

## 2023-08-19 ENCOUNTER — Encounter: Payer: Self-pay | Admitting: *Deleted

## 2023-08-19 VITALS — HR 85 | Temp 97.6°F | Resp 18 | Wt 107.7 lb

## 2023-08-19 DIAGNOSIS — E876 Hypokalemia: Secondary | ICD-10-CM | POA: Insufficient documentation

## 2023-08-19 DIAGNOSIS — C3431 Malignant neoplasm of lower lobe, right bronchus or lung: Secondary | ICD-10-CM | POA: Diagnosis not present

## 2023-08-19 DIAGNOSIS — C3491 Malignant neoplasm of unspecified part of right bronchus or lung: Secondary | ICD-10-CM | POA: Insufficient documentation

## 2023-08-19 DIAGNOSIS — Z87891 Personal history of nicotine dependence: Secondary | ICD-10-CM | POA: Diagnosis not present

## 2023-08-19 DIAGNOSIS — R911 Solitary pulmonary nodule: Secondary | ICD-10-CM | POA: Diagnosis not present

## 2023-08-19 DIAGNOSIS — D7589 Other specified diseases of blood and blood-forming organs: Secondary | ICD-10-CM | POA: Insufficient documentation

## 2023-08-19 DIAGNOSIS — Z801 Family history of malignant neoplasm of trachea, bronchus and lung: Secondary | ICD-10-CM | POA: Insufficient documentation

## 2023-08-19 DIAGNOSIS — D751 Secondary polycythemia: Secondary | ICD-10-CM | POA: Diagnosis not present

## 2023-08-19 LAB — CBC WITH DIFFERENTIAL (CANCER CENTER ONLY)
Abs Immature Granulocytes: 0.03 10*3/uL (ref 0.00–0.07)
Basophils Absolute: 0.1 10*3/uL (ref 0.0–0.1)
Basophils Relative: 1 %
Eosinophils Absolute: 0.1 10*3/uL (ref 0.0–0.5)
Eosinophils Relative: 1 %
HCT: 44.1 % (ref 36.0–46.0)
Hemoglobin: 15.2 g/dL — ABNORMAL HIGH (ref 12.0–15.0)
Immature Granulocytes: 0 %
Lymphocytes Relative: 33 %
Lymphs Abs: 2.8 10*3/uL (ref 0.7–4.0)
MCH: 35.8 pg — ABNORMAL HIGH (ref 26.0–34.0)
MCHC: 34.5 g/dL (ref 30.0–36.0)
MCV: 104 fL — ABNORMAL HIGH (ref 80.0–100.0)
Monocytes Absolute: 0.8 10*3/uL (ref 0.1–1.0)
Monocytes Relative: 9 %
Neutro Abs: 4.8 10*3/uL (ref 1.7–7.7)
Neutrophils Relative %: 56 %
Platelet Count: 320 10*3/uL (ref 150–400)
RBC: 4.24 MIL/uL (ref 3.87–5.11)
RDW: 13.7 % (ref 11.5–15.5)
WBC Count: 8.5 10*3/uL (ref 4.0–10.5)
nRBC: 0 % (ref 0.0–0.2)

## 2023-08-19 LAB — CMP (CANCER CENTER ONLY)
ALT: 32 U/L (ref 0–44)
AST: 45 U/L — ABNORMAL HIGH (ref 15–41)
Albumin: 3.6 g/dL (ref 3.5–5.0)
Alkaline Phosphatase: 124 U/L (ref 38–126)
Anion gap: 10 (ref 5–15)
BUN: 13 mg/dL (ref 6–20)
CO2: 22 mmol/L (ref 22–32)
Calcium: 8.9 mg/dL (ref 8.9–10.3)
Chloride: 104 mmol/L (ref 98–111)
Creatinine: 0.77 mg/dL (ref 0.44–1.00)
GFR, Estimated: >60 mL/min (ref >60)
Glucose, Bld: 88 mg/dL (ref 70–99)
Potassium: 2.9 mmol/L — ABNORMAL LOW (ref 3.5–5.1)
Sodium: 136 mmol/L (ref 135–145)
Total Bilirubin: 0.8 mg/dL (ref 0.0–1.2)
Total Protein: 6.3 g/dL — ABNORMAL LOW (ref 6.5–8.1)

## 2023-08-19 MED ORDER — POTASSIUM CHLORIDE CRYS ER 20 MEQ PO TBCR: 20.0000 meq | EXTENDED_RELEASE_TABLET | Freq: Two times a day (BID) | ORAL | 0 refills | Status: DC

## 2023-08-19 NOTE — Assessment & Plan Note (Signed)
 Imaging findings and pathology results were reviewed and discussed with patient. Stage IA right lung adenocarcinoma. Her DLCo was quite low on PFT's from last year, and there is plan to repeat pulmonary function. Patient has appointment with thoracic surgeon for evaluation of feasibility of resection. If she is able to get resection done, plan to see patient few weeks after surgery to go over final pathological staging and adjuvant plan.  If she is not able to get surgical resection due to pulmonary function.  I recommend patient to establish care with radiation oncology for radiation.

## 2023-08-19 NOTE — Assessment & Plan Note (Signed)
 Likely due to underlying chronic lung disease.

## 2023-08-19 NOTE — Assessment & Plan Note (Signed)
 Chronic macrocytosis without anemia. Check B12, folate.

## 2023-08-19 NOTE — Assessment & Plan Note (Signed)
 Recommend patient to start on potassium chloride 20 mEq twice daily for 1 week.  Repeat BMP in 2 weeks.

## 2023-08-19 NOTE — Progress Notes (Signed)
 Met with patient during initial consult with Dr. Cathie Hoops. All questions answered during visit. Pt given resources regarding diagnosis and supportive services available. Reviewed upcoming appts. Contact info given and instructed to call with any questions or needs. Pt verbalized understanding.

## 2023-08-19 NOTE — Progress Notes (Signed)
 Hematology/Oncology Consult Note [phone] [phone]     REFERRING PROVIDER: Raechel Chute, MD    CHIEF COMPLAINTS/PURPOSE OF CONSULTATION:  Stage I right lung adenocarcinoma.   ASSESSMENT & PLAN:   Cancer Staging  Adenocarcinoma of right lung (HCC) Staging form: Lung, AJCC V9 - Clinical: Stage IA2 (cT1b, cN0, cM0) - Signed by Rickard Patience, MD on 08/19/2023   Adenocarcinoma of right lung [facility]) Imaging findings and pathology results were reviewed and discussed with patient. Stage IA right lung adenocarcinoma. Her DLCo was quite low on PFT's from last year, and there is plan to repeat pulmonary function. Patient has appointment with thoracic surgeon for evaluation of feasibility of resection. If she is able to get resection done, plan to see patient few weeks after surgery to go over final pathological staging and adjuvant plan.  If she is not able to get surgical resection due to pulmonary function.  I recommend patient to establish care with radiation oncology for radiation.  Hypokalemia Recommend patient to start on potassium chloride 20 mEq twice daily for 1 week.  Repeat BMP in 2 weeks.  Erythrocytosis Likely due to underlying chronic lung disease.  Macrocytosis without anemia Chronic macrocytosis without anemia. Check B12, folate.   Orders Placed This Encounter  Procedures   CMP (Cancer Center only)    Standing Status:   Future    Number of Occurrences:   1    Expected Date:   08/19/2023    Expiration Date:   08/18/2024   CBC with Differential (Cancer Center Only)    Standing Status:   Future    Number of Occurrences:   1    Expected Date:   08/19/2023    Expiration Date:   08/18/2024   Follow-up to be determined. All questions were answered. The patient knows to call the clinic with any problems, questions or concerns.  Rickard Patience, MD, PhD [facility] Hematology Oncology 08/19/2023    HISTORY OF PRESENTING ILLNESS:  Joy Ball 58  y.o. female presents to establish care for Stage I right lung adenocarcinoma.  I have reviewed her chart and materials related to her cancer extensively and collaborated history with the patient. Summary of oncologic history is as follows: Oncology History  Adenocarcinoma of right lung (HCC)  12/26/2022 Imaging   Patient had a CT chest high-resolution without contrast done at [facility] predominant pulmonary fibrosis, UIP pattern. Emphysema with biapical areas of scarring.    07/31/2023 Imaging   CT super D chest without contrast 1. The 1.9 cm right upper lobe nodule along the inferior margin of the of the right upper lobe scarring is hypermetabolic on today's PET-CT, concerning for primary lung malignancy, or less likely active granulomatous process. 2. Extensive centrilobular emphysema. 3. Fibrosis and mild honeycombing in the lung bases, UIP pattern. 4. Hepatic steatosis. 5. Aortic and coronary atherosclerosis.   Aortic Atherosclerosis (ICD10-I70.0) and Emphysema (ICD10-J43.9).   08/12/2023 Imaging   PET scan showed 1. The 1.8 by 1.5 cm nodular lesion along the inferior margin of the chronic biapical pleuroparenchymal scarring in the right upper lobe abutting the major fissure has a maximum SUV of 5.8, suspicious for either malignancy (favored) or active granulomatous process. 2. No findings of distant metastatic disease. 3. Diffuse hepatic steatosis. 4. Chronic right maxillary sinusitis. 5. Aortic and coronary atherosclerosis.   Aortic Atherosclerosis (ICD10-I70.0) and Emphysema (ICD10-J43.9)   08/19/2023 Initial Diagnosis   Adenocarcinoma of right lung  -Patient has a history of lung fibrosis and centrilobular emphysema.  She follows up with pulmonology. Patient had high-resolution CT scan which showed a right lung nodule.  Pulmonary nodule appears significantly progressed compared to prior imaging in 2016.  PET scan was obtained at that time however imaging was not done due to  issues with insurance coverage. Patient reestablish care with pulmonology and additional imaging including high-resolution CT scan and a PET scan showed hypermetabolic right lower lung nodule.  08/11/2023 patient went bronchoscopy and right lower lobe nodule biopsy showed adenocarcinoma. EBUS to station 11L, 7, 4R, and 11R superior was negative for malignancy   08/19/2023 Cancer Staging   Staging form: Lung, AJCC V9 - Clinical: Stage IA2 (cT1b, cN0, cM0) - Signed by Rickard Patience, MD on 08/19/2023    She experiences shortness of breath with exertion, such as when taking out the trash, and has a mild cough, which is less severe since quitting smoking in September. No recent unintentional weight loss, fever, or hemoptysis. She has gained weight since quitting smoking in September 2024   MEDICAL HISTORY:  Past Medical History:  Diagnosis Date   Asthma    COPD (chronic obstructive pulmonary disease) (HCC)    Dyspnea    Emphysema lung (HCC)    GERD (gastroesophageal reflux disease)    Lung nodule    Migraines    Seasonal allergies     SURGICAL HISTORY: Past Surgical History:  Procedure Laterality Date   CESAREAN SECTION     EYE SURGERY Bilateral 01/2023   TUBAL LIGATION      SOCIAL HISTORY: Social History   Socioeconomic History   Marital status: Widowed    Spouse name: Not on file   Number of children: 3   Years of education: Not on file   Highest education level: Some college, no degree  Occupational History   Occupation: Lexicographer houses    Employer: OTHER  Tobacco Use   Smoking status: Former    Current packs/day: 0.00    Average packs/day: 2.0 packs/day for 39.7 years (79.3 ttl pk-yrs)    Types: Cigarettes    Start date: 32    Quit date: 02/22/2023    Years since quitting: 0.4   Smokeless tobacco: Current  Substance and Sexual Activity   Alcohol use: Yes    Alcohol/week: 0.0 standard drinks of alcohol    Comment: occ   Drug use: No   Sexual activity: Not on file   Other Topics Concern   Not on file  Social History Narrative   Not on file   Social Drivers of Health   Financial Resource Strain: Low Risk  (11/11/2022)   Overall Financial Resource Strain (CARDIA)    Difficulty of Paying Living Expenses: Not very hard  Food Insecurity: Food Insecurity Present (11/11/2022)   Hunger Vital Sign    Worried About Running Out of Food in the Last Year: Sometimes true    Ran Out of Food in the Last Year: Sometimes true  Transportation Needs: No Transportation Needs (11/11/2022)   PRAPARE - Administrator, Civil Service (Medical): No    Lack of Transportation (Non-Medical): No  Physical Activity: Unknown (11/11/2022)   Exercise Vital Sign    Days of Exercise per Week: 0 days    Minutes of Exercise per Session: Not on file  Stress: No Stress Concern Present (11/11/2022)   [employer] of Occupational Health - Occupational Stress Questionnaire    Feeling of Stress : Only a little  Social Connections: Unknown (12/22/2022)   [facility]   Social Network    Social Network: Not on file  Recent Concern: Social Connections - Moderately Isolated (11/11/2022)   Social Connection and Isolation Panel [NHANES]    Frequency of Communication with Friends and Family: More than three times a week    Frequency of Social Gatherings with Friends and Family: Once a week    Attends Religious Services: Never    Database administrator or Organizations: No    Attends Engineer, structural: Not on file    Marital Status: Living with partner  Intimate Partner Violence: Unknown (12/22/2022)   Received from [employer]   HITS    Physically Hurt: Not on file    Insult or Talk Down To: Not on file    Threaten Physical Harm: Not on file    Scream or Curse: Not on file    FAMILY HISTORY: Family History  Problem Relation Age of Onset   Lung cancer Father        was a smoker   Heart disease Maternal Grandfather     Emphysema Mother     ALLERGIES:  is allergic to amoxicillin.  MEDICATIONS:  Current Outpatient Medications  Medication Sig Dispense Refill   acetaminophen (TYLENOL) 500 MG tablet Take 1,500 mg by mouth 3 (three) times daily. 2 to 3 times a day.     albuterol (VENTOLIN HFA) 108 (90 Base) MCG/ACT inhaler Inhale 2 puffs into the lungs every 6 (six) hours as needed for wheezing or shortness of breath. 8 g 5   benzonatate (TESSALON) 100 MG capsule Take 1 capsule (100 mg total) by mouth 3 (three) times daily as needed for cough. 30 capsule 0   Budeson-Glycopyrrol-Formoterol (BREZTRI AEROSPHERE) 160-9-4.8 MCG/ACT AERO Inhale 2 puffs into the lungs 2 (two) times daily. 10.7 g 11   buPROPion (WELLBUTRIN SR) 150 MG 12 hr tablet TAKE 1 TABLET(150 MG) BY MOUTH TWICE DAILY 180 tablet 0   fluticasone (FLONASE) 50 MCG/ACT nasal spray Place 2 sprays into both nostrils daily. 16 g 0   ipratropium (ATROVENT) 0.03 % nasal spray Place 2 sprays into both nostrils every 12 (twelve) hours. 30 mL 0   loratadine (CLARITIN) 10 MG tablet Take 10 mg by mouth daily.     moxifloxacin (VIGAMOX) 0.5 % ophthalmic solution Place 1 drop into the right eye 4 (four) times daily.     naproxen (NAPROSYN) 500 MG tablet Take 1 tablet (500 mg total) by mouth 2 (two) times daily with a meal. 180 tablet 0   nystatin (MYCOSTATIN) 100000 UNIT/ML suspension Take 5 mLs (500,000 Units total) by mouth 4 (four) times daily. 60 mL 2   omeprazole (PRILOSEC) 20 MG capsule Take 20 mg by mouth daily.  6   prednisoLONE acetate (PRED FORTE) 1 % ophthalmic suspension Place 1 drop into the right eye 4 (four) times daily.     baclofen (LIORESAL) 10 MG tablet TAKE 1/2 TO 1 TABLET(5 TO 10 MG) BY MOUTH THREE TIMES DAILY AS NEEDED FOR MUSCLE SPASMS (Patient not taking: No sig reported) 90 tablet 3   gabapentin (NEURONTIN) 300 MG capsule Take 1 capsule (300 mg total) by mouth at bedtime. (Patient not taking: Reported on 08/10/2023) 90 capsule 1   nicotine  (NICODERM CQ) 14 mg/24hr patch Place 1 patch (14 mg total) onto the skin daily. (Patient not taking: Reported on 08/10/2023) 28 patch 0   potassium chloride SA (KLOR-CON M) 20 MEQ tablet Take 1 tablet (20 mEq total) by mouth  daily. (Patient not taking: Reported on 08/10/2023) 7 tablet 0   No current facility-administered medications for this visit.    Review of Systems  Constitutional:  Negative for appetite change, chills, fatigue and fever.  HENT:   Negative for hearing loss and voice change.   Eyes:  Negative for eye problems.  Respiratory:  Positive for shortness of breath. Negative for chest tightness and cough.   Cardiovascular:  Negative for chest pain.  Gastrointestinal:  Negative for abdominal distention, abdominal pain and blood in stool.  Endocrine: Negative for hot flashes.  Genitourinary:  Negative for difficulty urinating and frequency.   Musculoskeletal:  Negative for arthralgias.  Skin:  Negative for itching and rash.  Neurological:  Negative for extremity weakness.  Hematological:  Negative for adenopathy.  Psychiatric/Behavioral:  Negative for confusion.      PHYSICAL EXAMINATION: ECOG PERFORMANCE STATUS: 0 - Asymptomatic  Vitals:   08/19/23 1544  Pulse: 85  Resp: 18  Temp: 97.6 F (36.4 C)  SpO2: 98%   Filed Weights   08/19/23 1544  Weight: 107 lb 11.2 oz (48.9 kg)    Physical Exam Constitutional:      General: She is not in acute distress.    Appearance: She is not diaphoretic.  HENT:     Head: Normocephalic and atraumatic.  Eyes:     General: No scleral icterus. Cardiovascular:     Rate and Rhythm: Normal rate and regular rhythm.     Heart sounds: No murmur heard. Pulmonary:     Effort: Pulmonary effort is normal. No respiratory distress.     Breath sounds: No wheezing.     Comments: Decreased breath sounds bilaterally Abdominal:     General: There is no distension.     Palpations: Abdomen is soft.     Tenderness: There is no abdominal  tenderness.  Musculoskeletal:        General: Normal range of motion.     Cervical back: Normal range of motion and neck supple.  Skin:    General: Skin is warm and dry.     Findings: No erythema.  Neurological:     Mental Status: She is alert and oriented to person, place, and time. Mental status is at baseline.     Cranial Nerves: No cranial nerve deficit.     Motor: No abnormal muscle tone.  Psychiatric:        Mood and Affect: Mood and affect normal.      LABORATORY DATA:  I have reviewed the data as listed    Latest Ref Rng & Units 08/19/2023    4:11 PM 11/20/2022    9:33 AM 10/29/2016   12:25 PM  CBC  WBC 4.0 - 10.5 K/uL 8.5  12.2  9.2   Hemoglobin 12.0 - 15.0 g/dL 47.4  25.9  56.3   Hematocrit 36.0 - 46.0 % 44.1  38.9  39.3   Platelets 150 - 400 K/uL 320  413  404       Latest Ref Rng & Units 08/19/2023    4:11 PM 11/28/2022    9:15 AM 11/20/2022    9:33 AM  CMP  Glucose 70 - 99 mg/dL 88  875  81   BUN 6 - 20 mg/dL 13  8  20    Creatinine 0.44 - 1.00 mg/dL 6.43  3.29  5.18   Sodium 135 - 145 mmol/L 136  139  138   Potassium 3.5 - 5.1 mmol/L 2.9  3.3  3.1  Chloride 98 - 111 mmol/L 104  108  105   CO2 22 - 32 mmol/L 22  20  22    Calcium 8.9 - 10.3 mg/dL 8.9  8.7  9.4   Total Protein 6.5 - 8.1 g/dL 6.3   6.3   Total Bilirubin 0.0 - 1.2 mg/dL 0.8   0.5   Alkaline Phos 38 - 126 U/L 124     AST 15 - 41 U/L 45   28   ALT 0 - 44 U/L 32   19      RADIOGRAPHIC STUDIES: I have personally reviewed the radiological images as listed and agreed with the findings in the report. CT SUPER D CHEST WO CONTRAST Result Date: 08/12/2023 CLINICAL DATA:  Hypermetabolic right upper lobe pulmonary nodule * Tracking Code: BO * EXAM: CT CHEST WITHOUT CONTRAST TECHNIQUE: Multidetector CT imaging of the chest was performed using thin slice collimation for electromagnetic bronchoscopy planning purposes, without intravenous contrast. RADIATION DOSE REDUCTION: This exam was performed according to  the departmental dose-optimization program which includes automated exposure control, adjustment of the mA and/or kV according to patient size and/or use of iterative reconstruction technique. COMPARISON:  Outside chest CT 12/26/2022 FINDINGS: Cardiovascular: Atheromatous vascular calcification of the aortic arch, branch vessels, left anterior descending coronary artery. Mediastinum/Nodes: No pathologic adenopathy. Lungs/Pleura: Substantial biapical pleuroparenchymal scarring. In the right upper lobe along the inferior margin of this bandlike scarring, there is a more nodular component measuring 1.9 by 1.5 cm on image 44 series 4, comparing back to historical exams such as 09/25/2014 this nodular component was much less appreciable. On today's PET-CT this nodular component was hypermetabolic with maximum SUV 5.8, concerning for malignancy or less likely active granulomatous process. Extensive centrilobular emphysema. Fibrosis and mild honeycombing in the lung bases. Upper Abdomen: Hepatic steatosis.  Abdominal aortic atherosclerosis. Musculoskeletal: Unremarkable IMPRESSION: 1. The 1.9 cm right upper lobe nodule along the inferior margin of the of the right upper lobe scarring is hypermetabolic on today's PET-CT, concerning for primary lung malignancy, or less likely active granulomatous process. 2. Extensive centrilobular emphysema. 3. Fibrosis and mild honeycombing in the lung bases, UIP pattern. 4. Hepatic steatosis. 5. Aortic and coronary atherosclerosis. Aortic Atherosclerosis (ICD10-I70.0) and Emphysema (ICD10-J43.9). Electronically Signed   By: Gaylyn Rong M.D.   On: 08/12/2023 15:02   NM PET Image Initial (PI) Skull Base To Thigh (F-18 FDG) Result Date: 08/12/2023 CLINICAL DATA:  Initial treatment strategy for pulmonary nodule. EXAM: NUCLEAR MEDICINE PET SKULL BASE TO THIGH TECHNIQUE: 5.8 mCi F-18 FDG was injected intravenously. Full-ring PET imaging was performed from the skull base to thigh after  the radiotracer. CT data was obtained and used for attenuation correction and anatomic localization. Fasting blood glucose: 83 mg/dl COMPARISON:  Chest CT 16/03/9603 FINDINGS: Mediastinal blood pool activity: SUV max 2.1 Liver activity: SUV max NA NECK: No significant abnormal hypermetabolic activity in this region. Incidental CT findings: Chronic right maxillary sinusitis. Left common carotid atheromatous vascular calcification. CHEST: Chronic biapical pleuroparenchymal scarring. A 1.8 by 1.5 cm nodular lesion along the inferior margin of this scarring in the right upper lobe abutting the major fissure has a maximum SUV of 5.8, suspicious for either malignancy or active granulomatous process. Substantial contrast tracking in the left upper arm likely from extravasation of contrast. Faint activity in thin left axillary lymph nodes also probably related to this extravasation. Incidental CT findings: Coronary, aortic arch, and branch vessel atherosclerotic vascular disease. Emphysema. ABDOMEN/PELVIS: No significant abnormal hypermetabolic activity in this region.  Incidental CT findings: Diffuse hepatic steatosis. Atherosclerosis is present, including aortoiliac atherosclerotic disease. SKELETON: No significant abnormal hypermetabolic activity in this region. Incidental CT findings: None. IMPRESSION: 1. The 1.8 by 1.5 cm nodular lesion along the inferior margin of the chronic biapical pleuroparenchymal scarring in the right upper lobe abutting the major fissure has a maximum SUV of 5.8, suspicious for either malignancy (favored) or active granulomatous process. 2. No findings of distant metastatic disease. 3. Diffuse hepatic steatosis. 4. Chronic right maxillary sinusitis. 5. Aortic and coronary atherosclerosis. Aortic Atherosclerosis (ICD10-I70.0) and Emphysema (ICD10-J43.9). Electronically Signed   By: Gaylyn Rong M.D.   On: 08/12/2023 14:53   DG Chest Port 1 View Result Date: 08/11/2023 CLINICAL DATA:   Status post bronchoscopy EXAM: PORTABLE CHEST 1 VIEW COMPARISON:  06/19/2022, 01/12/2024 FINDINGS: Emphysema. Pleuroparenchymal scarring at the apices. No acute airspace disease. Vague right upper lobe opacity corresponding to nodular density on CT. Upper normal cardiac size. No pneumothorax. IMPRESSION: No pneumothorax. Emphysema. Vague right upper lobe opacity corresponding to nodular density on CT. Electronically Signed   By: Jasmine Pang M.D.   On: 08/11/2023 18:52   DG C-Arm 1-60 Min-No Report Result Date: 08/11/2023 Fluoroscopy was utilized by the requesting physician.  No radiographic interpretation.

## 2023-08-20 ENCOUNTER — Other Ambulatory Visit: Payer: Self-pay | Admitting: Family Medicine

## 2023-08-20 ENCOUNTER — Telehealth: Payer: Self-pay

## 2023-08-20 DIAGNOSIS — M7712 Lateral epicondylitis, left elbow: Secondary | ICD-10-CM

## 2023-08-20 DIAGNOSIS — G8929 Other chronic pain: Secondary | ICD-10-CM

## 2023-08-20 NOTE — Telephone Encounter (Signed)
-----   Message from Rickard Patience sent at 08/19/2023 10:35 PM EST ----- Please let patient know that her potassium is very low. I recommend her to take potassium twice daily for 1 week. Recommend to repeat blood work in 2 weeks. Ordered. Thanks.

## 2023-08-20 NOTE — Telephone Encounter (Signed)
 Called patient and informed her that her potassium is very low and that Dr. Cathie Hoops recommends her to take potassium twice daily for 1 week. Also informed patient that Dr. Cathie Hoops would like to repeat blood work in 2 weeks. Patient gave verbal understanding to this. I will have Morrie Sheldon schedule and notify patient of appointment date and time.

## 2023-08-21 ENCOUNTER — Telehealth: Payer: Self-pay

## 2023-08-21 NOTE — Telephone Encounter (Signed)
*  Pulm  Pharmacy Patient Advocate Encounter   Received notification from CoverMyMeds that prior authorization for Breztri Aerosphere 160-9-4.8MCG/ACT aerosol  is required/requested.   Insurance verification completed.   The patient is insured through [facility] .   Per test claim: PA required; PA started via CoverMyMeds. KEY [id number] . Please see clinical question(s) below that I am not finding the answer to in her chart and advise.   Per plan patient must have failure/contraindication to one of the following combinations:   Advair Diskus/HFA and Dulera Advair Diskus/HFA and Symbicort Dulera and Symbicort

## 2023-08-21 NOTE — Telephone Encounter (Signed)
 Requested Prescriptions  Pending Prescriptions Disp Refills   gabapentin (NEURONTIN) 300 MG capsule [Pharmacy Med Name: GABAPENTIN 300MG  CAPSULES] 90 capsule 1    Sig: TAKE 1 CAPSULE(300 MG) BY MOUTH AT BEDTIME     Neurology: Anticonvulsants - gabapentin Passed - 08/21/2023  9:03 AM      Passed - Cr in normal range and within 360 days    Creatinine  Date Value Ref Range Status  08/19/2023 0.77 0.44 - 1.00 mg/dL Final   Creat  Date Value Ref Range Status  11/28/2022 0.75 0.50 - 1.03 mg/dL Final         Passed - Completed PHQ-2 or PHQ-9 in the last 360 days      Passed - Valid encounter within last 12 months    Recent Outpatient Visits           7 months ago Tobacco abuse   [facility] Smitty Cords, DO   9 months ago Annual physical exam   [facility] Smitty Cords, DO   9 months ago Asthma with COPD   [facility] Smitty Cords, DO   1 year ago Asthma with COPD   [facility] Smitty Cords, DO   3 years ago COPD with acute exacerbation [facility])   [facility] Smitty Cords, DO       Future Appointments             In 3 months Raechel Chute, MD [facility]monary [facility] at [facility]

## 2023-08-22 DIAGNOSIS — Z419 Encounter for procedure for purposes other than remedying health state, unspecified: Secondary | ICD-10-CM | POA: Diagnosis not present

## 2023-08-25 ENCOUNTER — Telehealth: Payer: Self-pay | Admitting: Student in an Organized Health Care Education/Training Program

## 2023-08-25 ENCOUNTER — Other Ambulatory Visit (HOSPITAL_COMMUNITY): Payer: Self-pay

## 2023-08-25 NOTE — Telephone Encounter (Signed)
 Patient called satating she was needing a pulmonary function test before her visit with the thoracic surgeon which is scheduled for 3/14. Per Dr. Doreene Adas notes, "Her DLCo was quite low on PFT's from last year, but we will obtain a repeat prior to visit with thoracic surgery."  No PFT appts in the clinic- can we get this done in the hospital before 3/14?

## 2023-08-26 ENCOUNTER — Other Ambulatory Visit: Payer: Self-pay

## 2023-08-26 DIAGNOSIS — C3491 Malignant neoplasm of unspecified part of right bronchus or lung: Secondary | ICD-10-CM

## 2023-08-26 MED ORDER — SPIRIVA RESPIMAT 2.5 MCG/ACT IN AERS: 2.0000 | INHALATION_SPRAY | Freq: Every day | RESPIRATORY_TRACT | 12 refills | Status: DC

## 2023-08-26 MED ORDER — BUDESONIDE-FORMOTEROL FUMARATE 160-4.5 MCG/ACT IN AERO: 2.0000 | INHALATION_SPRAY | Freq: Every day | RESPIRATORY_TRACT | 12 refills | Status: DC

## 2023-08-26 NOTE — Telephone Encounter (Signed)
 I have spoke with Joy Ball and she is willing to go to [facility] for her PFT. I am trying to get it scheduled at [facility] on 08/31/23  @ 10:00am. Patient is aware of my plan

## 2023-08-26 NOTE — Addendum Note (Signed)
 Addended by: Bonney Leitz on: 08/26/2023 04:08 PM   Modules accepted: Orders

## 2023-08-26 NOTE — Telephone Encounter (Signed)
 Medications have been sent to the patient's pharmacy.  Nothing further needed.

## 2023-08-26 NOTE — Telephone Encounter (Signed)
 Yes the patient has been scheduled for PFT at [facility] on 310/25 @10 :00am

## 2023-08-28 ENCOUNTER — Encounter: Payer: Medicaid Other | Admitting: Thoracic Surgery (Cardiothoracic Vascular Surgery)

## 2023-08-31 ENCOUNTER — Ambulatory Visit (HOSPITAL_COMMUNITY)
Admission: RE | Admit: 2023-08-31 | Discharge: 2023-08-31 | Disposition: A | Discharge status: Home / Self Care | Source: Ambulatory Visit | Attending: Student in an Organized Health Care Education/Training Program | Admitting: Student in an Organized Health Care Education/Training Program

## 2023-08-31 DIAGNOSIS — C3491 Malignant neoplasm of unspecified part of right bronchus or lung: Secondary | ICD-10-CM | POA: Insufficient documentation

## 2023-08-31 LAB — PULMONARY FUNCTION TEST
DL/VA % pred: 47 %
DL/VA: 2.05 ml/min/mmHg/L
DLCO unc % pred: 35 %
DLCO unc: 6.65 ml/min/mmHg
FEF 25-75 Post: 1.21 L/s
FEF 25-75 Pre: 0.86 L/s
FEF2575-%Change-Post: 41 %
FEF2575-%Pred-Post: 51 %
FEF2575-%Pred-Pre: 36 %
FEV1-%Change-Post: 7 %
FEV1-%Pred-Post: 69 %
FEV1-%Pred-Pre: 64 %
FEV1-Post: 1.65 L
FEV1-Pre: 1.53 L
FEV1FVC-%Change-Post: 7 %
FEV1FVC-%Pred-Pre: 86 %
FEV6-%Change-Post: 0 %
FEV6-%Pred-Post: 75 %
FEV6-%Pred-Pre: 76 %
FEV6-Post: 2.23 L
FEV6-Pre: 2.25 L
FEV6FVC-%Pred-Post: 103 %
FEV6FVC-%Pred-Pre: 103 %
FVC-%Change-Post: 0 %
FVC-%Pred-Post: 73 %
FVC-%Pred-Pre: 73 %
FVC-Post: 2.26 L
FVC-Pre: 2.25 L
Post FEV1/FVC ratio: 73 %
Post FEV6/FVC ratio: 100 %
Pre FEV1/FVC ratio: 68 %
Pre FEV6/FVC Ratio: 100 %
RV % pred: 69 %
RV: 1.22 L
TLC % pred: 75 %
TLC: 3.47 L

## 2023-08-31 MED ORDER — ALBUTEROL SULFATE (2.5 MG/3ML) 0.083% IN NEBU
2.5000 mg | INHALATION_SOLUTION | Freq: Once | RESPIRATORY_TRACT | Status: AC
  Administered 2023-08-31: 2.5 mg via RESPIRATORY_TRACT

## 2023-09-02 ENCOUNTER — Ambulatory Visit: Payer: Medicaid Other | Admitting: Student in an Organized Health Care Education/Training Program

## 2023-09-02 ENCOUNTER — Inpatient Hospital Stay: Payer: Medicaid Other | Attending: Oncology

## 2023-09-02 DIAGNOSIS — D7589 Other specified diseases of blood and blood-forming organs: Secondary | ICD-10-CM

## 2023-09-02 DIAGNOSIS — Z87891 Personal history of nicotine dependence: Secondary | ICD-10-CM | POA: Diagnosis not present

## 2023-09-02 DIAGNOSIS — E876 Hypokalemia: Secondary | ICD-10-CM

## 2023-09-02 DIAGNOSIS — D751 Secondary polycythemia: Secondary | ICD-10-CM

## 2023-09-02 DIAGNOSIS — C3411 Malignant neoplasm of upper lobe, right bronchus or lung: Secondary | ICD-10-CM | POA: Insufficient documentation

## 2023-09-02 DIAGNOSIS — C3491 Malignant neoplasm of unspecified part of right bronchus or lung: Secondary | ICD-10-CM

## 2023-09-02 LAB — BASIC METABOLIC PANEL - CANCER CENTER ONLY
Anion gap: 11 (ref 5–15)
BUN: 13 mg/dL (ref 6–20)
CO2: 22 mmol/L (ref 22–32)
Calcium: 9 mg/dL (ref 8.9–10.3)
Chloride: 102 mmol/L (ref 98–111)
Creatinine: 0.76 mg/dL (ref 0.44–1.00)
GFR, Estimated: >60 mL/min (ref >60)
Glucose, Bld: 97 mg/dL (ref 70–99)
Potassium: 3.9 mmol/L (ref 3.5–5.1)
Sodium: 135 mmol/L (ref 135–145)

## 2023-09-02 LAB — FOLATE: Folate: 6 ng/mL (ref >5.9)

## 2023-09-02 LAB — VITAMIN B12: Vitamin B-12: 268 pg/mL (ref 180–914)

## 2023-09-03 ENCOUNTER — Other Ambulatory Visit: Payer: Self-pay | Admitting: Internal Medicine

## 2023-09-03 ENCOUNTER — Other Ambulatory Visit: Payer: Self-pay | Admitting: Family Medicine

## 2023-09-03 DIAGNOSIS — Z720 Tobacco use: Secondary | ICD-10-CM

## 2023-09-03 DIAGNOSIS — G8929 Other chronic pain: Secondary | ICD-10-CM

## 2023-09-03 DIAGNOSIS — M7712 Lateral epicondylitis, left elbow: Secondary | ICD-10-CM

## 2023-09-03 NOTE — Telephone Encounter (Signed)
 Requested medications are due for refill today.  yes  Requested medications are on the active medications list.  yes  Last refill. 04/16/2024 #180 0 rf  Future visit scheduled.   no  Notes to clinic.  Pt last seen 11/20/2022    Requested Prescriptions  Pending Prescriptions Disp Refills   buPROPion (WELLBUTRIN SR) 150 MG 12 hr tablet [Pharmacy Med Name: BUPROPION SR 150MG  TABLETS (12 H)] 180 tablet 0    Sig: TAKE 1 TABLET(150 MG) BY MOUTH TWICE DAILY     Psychiatry: Antidepressants - bupropion Failed - 09/03/2023  5:01 PM      Failed - AST in normal range and within 360 days    AST  Date Value Ref Range Status  08/19/2023 45 (H) 15 - 41 U/L Final         Failed - Valid encounter within last 6 months    Recent Outpatient Visits           7 months ago Tobacco abuse   [facility] Smitty Cords, DO   9 months ago Annual physical exam   [facility] S[facility] C[facility], DO   9 months ago Asthma with COPD   [facility], Netta Neat, DO   1 year ago Asthma with COPD   [facility], Netta Neat, DO   3 years ago COPD with acute exacerbation [facility])   [facility] Smitty Cords, DO       Future Appointments             In 2 months Raechel Chute, MD [facility] Pulmonary Care at [facility] - Cr in normal range and within 360 days    Creatinine  Date Value Ref Range Status  09/02/2023 0.76 0.44 - 1.00 mg/dL Final   Creat  Date Value Ref Range Status  11/28/2022 0.75 0.50 - 1.03 mg/dL Final         Passed - ALT in normal range and within 360 days    ALT  Date Value Ref Range Status  08/19/2023 32 0 - 44 U/L Final         Passed - Last BP in normal range    BP Readings from Last 1 Encounters:  08/18/23 110/72

## 2023-09-03 NOTE — Telephone Encounter (Signed)
 Requested medications are due for refill today.  yes  Requested medications are on the active medications list.  yes  Last refill. 05/15/2023 #180 0 rf  Future visit scheduled.   no  Notes to clinic.  Pt last seen 11/20/2022    Requested Prescriptions  Pending Prescriptions Disp Refills   naproxen (NAPROSYN) 500 MG tablet [Pharmacy Med Name: NAPROXEN 500MG  TABLETS] 180 tablet 0    Sig: TAKE 1 TABLET(500 MG) BY MOUTH TWICE DAILY WITH A MEAL     Analgesics:  NSAIDS Failed - 09/03/2023  4:55 PM      Failed - Manual Review: Labs are only required if the patient has taken medication for more than 8 weeks.      Failed - HGB in normal range and within 360 days    Hemoglobin  Date Value Ref Range Status  08/19/2023 15.2 (H) 12.0 - 15.0 g/dL Final         Passed - Cr in normal range and within 360 days    Creatinine  Date Value Ref Range Status  09/02/2023 0.76 0.44 - 1.00 mg/dL Final   Creat  Date Value Ref Range Status  11/28/2022 0.75 0.50 - 1.03 mg/dL Final         Passed - PLT in normal range and within 360 days    Platelet Count  Date Value Ref Range Status  08/19/2023 320 150 - 400 K/uL Final         Passed - HCT in normal range and within 360 days    HCT  Date Value Ref Range Status  08/19/2023 44.1 36.0 - 46.0 % Final         Passed - eGFR is 30 or above and within 360 days    GFR, Est African American  Date Value Ref Range Status  10/29/2016 >89 >=60 mL/min Final   GFR, Est Non African American  Date Value Ref Range Status  10/29/2016 >89 >=60 mL/min Final   GFR, Estimated  Date Value Ref Range Status  09/02/2023 >60 >60 mL/min Final    Comment:    (NOTE) Calculated using the CKD-EPI Creatinine Equation (2021)    eGFR  Date Value Ref Range Status  11/28/2022 94 > OR = 60 mL/min/1.28m2 Final         Passed - Patient is not pregnant      Passed - Valid encounter within last 12 months    Recent Outpatient Visits           7 months ago Tobacco  abuse   [facility] Smitty Cords, DO   9 months ago Annual physical exam   [facility] Smitty Cords, DO   9 months ago Asthma with COPD   [facility] Smitty Cords, DO   1 year ago Asthma with COPD   [facility] Smitty Cords, DO   3 years ago COPD with acute exacerbation [facility])   [facility] Smitty Cords, DO       Future Appointments             In 2 months Raechel Chute, MD [facility]

## 2023-09-04 ENCOUNTER — Encounter: Payer: Self-pay | Admitting: *Deleted

## 2023-09-04 ENCOUNTER — Institutional Professional Consult (permissible substitution): Payer: Medicaid Other | Admitting: Thoracic Surgery (Cardiothoracic Vascular Surgery)

## 2023-09-04 VITALS — BP 115/75 | HR 96 | Resp 18 | Ht 61.0 in | Wt 112.0 lb

## 2023-09-04 DIAGNOSIS — C3491 Malignant neoplasm of unspecified part of right bronchus or lung: Secondary | ICD-10-CM | POA: Diagnosis not present

## 2023-09-04 NOTE — Progress Notes (Signed)
 Pt evaluated by surgery and determined to not be a surgical candidate. Referral to radiation oncology has been placed. Pt will be called with appt once scheduled. Dr. Cathie Hoops made aware.

## 2023-09-04 NOTE — Progress Notes (Signed)
 [address]       [place] [address]             [phone]                    Joy Ball [facility] Medical Record #[id number] Date of Birth: [birth year]  Referring: Raechel Chute, MD Primary Care: Smitty Cords, DO Primary Cardiologist: None  Chief Complaint:    Chief Complaint  Patient presents with   Lung Lesion    Review work up    History of Present Illness:    Joy Ball 58 y.o. female presents for surgical evaluation of a right upper lobe biopsy-proven adenocarcinoma.  She is a former smoker quit in September of last year.       Past Medical History:  Diagnosis Date   Asthma    COPD (chronic obstructive pulmonary disease) (HCC)    Dyspnea    Emphysema lung (HCC)    GERD (gastroesophageal reflux disease)    Lung nodule    Migraines    Seasonal allergies     Past Surgical History:  Procedure Laterality Date   CESAREAN SECTION     EYE SURGERY Bilateral 01/2023   TUBAL LIGATION      Family History  Problem Relation Age of Onset   Lung cancer Father        was a smoker   Heart disease Maternal Grandfather    Emphysema Mother      Social History   Tobacco Use  Smoking Status Former   Current packs/day: 0.00   Average packs/day: 2.0 packs/day for 39.7 years (79.3 ttl pk-yrs)   Types: Cigarettes   Start date: 81   Quit date: 02/22/2023   Years since quitting: 0.5  Smokeless Tobacco Current    Social History   Substance and Sexual Activity  Alcohol Use Yes   Alcohol/week: 0.0 standard drinks of alcohol   Comment: occ     Allergies  Allergen Reactions   Amoxicillin Hives    Current Outpatient Medications  Medication Sig Dispense Refill   acetaminophen (TYLENOL) 500 MG tablet Take 1,500 mg by mouth 3 (three) times daily. 2 to 3 times a day.     albuterol (VENTOLIN HFA) 108 (90 Base) MCG/ACT inhaler Inhale 2 puffs into the lungs every 6 (six) hours as needed for wheezing or shortness of breath.  8 g 5   baclofen (LIORESAL) 10 MG tablet TAKE 1/2 TO 1 TABLET(5 TO 10 MG) BY MOUTH THREE TIMES DAILY AS NEEDED FOR MUSCLE SPASMS 90 tablet 3   benzonatate (TESSALON) 100 MG capsule Take 1 capsule (100 mg total) by mouth 3 (three) times daily as needed for cough. 30 capsule 0   budesonide-formoterol (SYMBICORT) 160-4.5 MCG/ACT inhaler Inhale 2 puffs into the lungs daily. 1 each 12   buPROPion (WELLBUTRIN SR) 150 MG 12 hr tablet TAKE 1 TABLET(150 MG) BY MOUTH TWICE DAILY 180 tablet 0   fluticasone (FLONASE) 50 MCG/ACT nasal spray Place 2 sprays into both nostrils daily. 16 g 0   gabapentin (NEURONTIN) 300 MG capsule TAKE 1 CAPSULE(300 MG) BY MOUTH AT BEDTIME 90 capsule 0   ipratropium (ATROVENT) 0.03 % nasal spray Place 2 sprays into both nostrils every 12 (twelve) hours. 30 mL 0   loratadine (CLARITIN) 10 MG tablet Take 10 mg by mouth daily.     moxifloxacin (VIGAMOX) 0.5 % ophthalmic solution Place 1 drop into the right eye 4 (four)  times daily.     naproxen (NAPROSYN) 500 MG tablet TAKE 1 TABLET(500 MG) BY MOUTH TWICE DAILY WITH A MEAL 180 tablet 0   nicotine (NICODERM CQ) 14 mg/24hr patch Place 1 patch (14 mg total) onto the skin daily. 28 patch 0   nystatin (MYCOSTATIN) 100000 UNIT/ML suspension Take 5 mLs (500,000 Units total) by mouth 4 (four) times daily. 60 mL 2   omeprazole (PRILOSEC) 20 MG capsule Take 20 mg by mouth daily.  6   potassium chloride SA (KLOR-CON M) 20 MEQ tablet Take 1 tablet (20 mEq total) by mouth 2 (two) times daily. 14 tablet 0   prednisoLONE acetate (PRED FORTE) 1 % ophthalmic suspension Place 1 drop into the right eye 4 (four) times daily.     Tiotropium Bromide Monohydrate (SPIRIVA RESPIMAT) 2.5 MCG/ACT AERS Inhale 2 puffs into the lungs daily. 4 g 12   potassium chloride SA (KLOR-CON M) 20 MEQ tablet Take 1 tablet (20 mEq total) by mouth daily. (Patient not taking: Reported on 09/04/2023) 7 tablet 0   No current facility-administered medications for this visit.     Review of Systems  Respiratory:  Positive for cough and shortness of breath.   Cardiovascular:  Negative for chest pain.  Neurological: Negative.      PHYSICAL EXAMINATION: BP 115/75 (BP Location: Right Arm)   Pulse 96   Resp 18   Ht 5\' 1"  (1.549 m)   Wt 112 lb (50.8 kg)   SpO2 92%   BMI 21.16 kg/m  Physical Exam Constitutional:      General: She is not in acute distress.    Appearance: She is not ill-appearing.  HENT:     Head: Normocephalic and atraumatic.  Eyes:     Extraocular Movements: Extraocular movements intact.  Cardiovascular:     Rate and Rhythm: Normal rate.  Pulmonary:     Effort: Pulmonary effort is normal. No respiratory distress.  Abdominal:     General: There is no distension.  Musculoskeletal:        General: Normal range of motion.     Cervical back: Normal range of motion.  Skin:    General: Skin is warm and dry.  Neurological:     General: No focal deficit present.     Mental Status: She is alert and oriented to person, place, and time.          I have independently reviewed the above radiology studies  and reviewed the findings with the patient.   Recent Lab Findings: Lab Results  Component Value Date   WBC 8.5 08/19/2023   HGB 15.2 (H) 08/19/2023   HCT 44.1 08/19/2023   PLT 320 08/19/2023   GLUCOSE 97 09/02/2023   CHOL 274 (H) 11/20/2022   TRIG 117 11/20/2022   HDL 191 11/20/2022   LDLCALC 63 11/20/2022   ALT 32 08/19/2023   AST 45 (H) 08/19/2023   NA 135 09/02/2023   K 3.9 09/02/2023   CL 102 09/02/2023   CREATININE 0.76 09/02/2023   BUN 13 09/02/2023   CO2 22 09/02/2023   TSH 1.99 11/20/2022   HGBA1C 5.0 11/20/2022    Diagnostic Studies & Laboratory data:     Recent Radiology Findings:   [facility] Chest Port 1 View Result Date: 08/11/2023 CLINICAL DATA:  Status post bronchoscopy EXAM: PORTABLE CHEST 1 VIEW COMPARISON:  06/19/2022, 01/12/2024 FINDINGS: Emphysema. Pleuroparenchymal scarring at the apices. No acute  airspace disease. Vague right upper lobe opacity corresponding to nodular density on CT.  Upper normal cardiac size. No pneumothorax. IMPRESSION: No pneumothorax. Emphysema. Vague right upper lobe opacity corresponding to nodular density on CT. Electronically Signed   By: Jasmine Pang M.D.   On: 08/11/2023 18:52   DG C-Arm 1-60 Min-No Report Result Date: 08/11/2023 Fluoroscopy was utilized by the requesting physician.  No radiographic interpretation.     PFTs:  - FVC: 73% - FEV1: 64% -DLCO: 35%    Assessment / Plan:   58 year old female with a non-small cell lung cancer.  Fortunately her pulmonary functions are prohibitive for surgical resection.  Most recent DLCO was only 35% which is improved from 28% last year, but if she were to undergo any resection she likely would end up lifelong oxygen.  I have recommended that she be evaluated by radiation oncology.  She is in agreement with this plan.   I  spent 30 minutes with  the patient face to face in counseling and coordination of care.    Corliss Skains 09/04/2023 10:56 AM

## 2023-09-07 ENCOUNTER — Encounter: Payer: Self-pay | Admitting: *Deleted

## 2023-09-09 ENCOUNTER — Inpatient Hospital Stay: Payer: Medicaid Other | Admitting: Hospice and Palliative Medicine

## 2023-09-09 DIAGNOSIS — C3491 Malignant neoplasm of unspecified part of right bronchus or lung: Secondary | ICD-10-CM

## 2023-09-09 NOTE — Progress Notes (Signed)
 Multidisciplinary Oncology Council Documentation  OLIVINE HIERS was presented by our [facility] on 09/09/2023, which included representatives from:  Palliative Care Dietitian  Physical/Occupational Therapist Nurse Navigator Genetics Social work Survivorship RN Financial Navigator Research RN   Chealsea currently presents with history of lung cancer  We reviewed previous medical and familial history, history of present illness, and recent lab results along with all available histopathologic and imaging studies. The MOC considered available treatment options and made the following recommendations/referrals:  None currently  The MOC is a meeting of clinicians from various specialty areas who evaluate and discuss patients for whom a multidisciplinary approach is being considered. Final determinations in the plan of care are those of the provider(s).   Today's extended care, comprehensive team conference, Jillana was not present for the discussion and was not examined.

## 2023-09-10 ENCOUNTER — Encounter: Payer: Self-pay | Admitting: Radiation Oncology

## 2023-09-10 ENCOUNTER — Ambulatory Visit
Admission: RE | Admit: 2023-09-10 | Discharge: 2023-09-10 | Disposition: A | Discharge status: Home / Self Care | Source: Ambulatory Visit | Attending: Radiation Oncology | Admitting: Radiation Oncology

## 2023-09-10 ENCOUNTER — Encounter: Payer: Self-pay | Admitting: *Deleted

## 2023-09-10 VITALS — BP 110/77 | HR 93 | Temp 98.7°F | Resp 16 | Wt 114.0 lb

## 2023-09-10 DIAGNOSIS — C3411 Malignant neoplasm of upper lobe, right bronchus or lung: Secondary | ICD-10-CM | POA: Diagnosis not present

## 2023-09-10 DIAGNOSIS — C3491 Malignant neoplasm of unspecified part of right bronchus or lung: Secondary | ICD-10-CM

## 2023-09-10 DIAGNOSIS — Z87891 Personal history of nicotine dependence: Secondary | ICD-10-CM | POA: Diagnosis not present

## 2023-09-10 NOTE — Consult Note (Signed)
 NEW PATIENT EVALUATION  Name: Joy Ball  MRN: [id number]  Date:   09/10/2023     DOB: 1966/01/08   This 58 y.o. female patient presents to the clinic for initial evaluation of stage Ia adenocarcinoma the right upper lobe and patient with significant pulmonary pathology.  Her stage is 1A (cT1b N0 M0)  REFERRING PHYSICIAN: Saralyn Pilar *  CHIEF COMPLAINT:  Chief Complaint  Patient presents with   Lung Cancer    DIAGNOSIS: The encounter diagnosis was Adenocarcinoma of right lung (HCC).   PREVIOUS INVESTIGATIONS:  CT scan and PET CT scans reviewed Pathology reports reviewed Clinical notes reviewed  HPI: Patient is a 58 year old female followed for right upper lobe lesion over the past year.  She was noted to have a 1.9 cm right upper lobe nodule along the inferior margin of the right upper lobe.  She is extensive centrilobular emphysema.  PET CT scan was performed again showing 1.8 cm nodule lesion along the inferior margin of the pleural-parenchymal scarring in the right upper lobe abutting the major fissure suspicious for malignancy.  There were no findings to suggest distant metastatic disease.  Biopsy of the right upper lobe lesion was positive for adenocarcinoma.  She has been seen by Dr. Cliffton Asters cardiothoracic surgeon who is declined surgery based on her poor pulmonary function test including a DLCO which was only 35%.  She is seen today for consideration of SBRT treatment.  She does have a chronic nonproductive cough does have dyspnea on exertion.  She specifically did not [place] hemoptysis or dysphagia.  PLANNED TREATMENT REGIMEN: SBRT  PAST MEDICAL HISTORY:  has a past medical history of Asthma, COPD (chronic obstructive pulmonary disease) (HCC), Dyspnea, Emphysema lung (HCC), GERD (gastroesophageal reflux disease), Lung nodule, Migraines, and Seasonal allergies.    PAST SURGICAL HISTORY:  Past Surgical History:  Procedure Laterality Date   CESAREAN SECTION      EYE SURGERY Bilateral 01/2023   TUBAL LIGATION      FAMILY HISTORY: family history includes Emphysema in her mother; Heart disease in her maternal grandfather; Lung cancer in her father.  SOCIAL HISTORY:  reports that she quit smoking about 6 months ago. Her smoking use included cigarettes. She started smoking about 40 years ago. She has a 79.3 pack-year smoking history. She uses smokeless tobacco. She reports current alcohol use. She reports that she does not use drugs.  ALLERGIES: Amoxicillin  MEDICATIONS:  Current Outpatient Medications  Medication Sig Dispense Refill   acetaminophen (TYLENOL) 500 MG tablet Take 1,500 mg by mouth 3 (three) times daily. 2 to 3 times a day.     albuterol (VENTOLIN HFA) 108 (90 Base) MCG/ACT inhaler Inhale 2 puffs into the lungs every 6 (six) hours as needed for wheezing or shortness of breath. 8 g 5   baclofen (LIORESAL) 10 MG tablet TAKE 1/2 TO 1 TABLET(5 TO 10 MG) BY MOUTH THREE TIMES DAILY AS NEEDED FOR MUSCLE SPASMS 90 tablet 3   benzonatate (TESSALON) 100 MG capsule Take 1 capsule (100 mg total) by mouth 3 (three) times daily as needed for cough. 30 capsule 0   budesonide-formoterol (SYMBICORT) 160-4.5 MCG/ACT inhaler Inhale 2 puffs into the lungs daily. 1 each 12   buPROPion (WELLBUTRIN SR) 150 MG 12 hr tablet TAKE 1 TABLET(150 MG) BY MOUTH TWICE DAILY 180 tablet 0   fluticasone (FLONASE) 50 MCG/ACT nasal spray Place 2 sprays into both nostrils daily. 16 g 0   gabapentin (NEURONTIN) 300 MG capsule TAKE 1 CAPSULE(300 MG)  BY MOUTH AT BEDTIME 90 capsule 0   ipratropium (ATROVENT) 0.03 % nasal spray Place 2 sprays into both nostrils every 12 (twelve) hours. 30 mL 0   loratadine (CLARITIN) 10 MG tablet Take 10 mg by mouth daily.     moxifloxacin (VIGAMOX) 0.5 % ophthalmic solution Place 1 drop into the right eye 4 (four) times daily.     naproxen (NAPROSYN) 500 MG tablet TAKE 1 TABLET(500 MG) BY MOUTH TWICE DAILY WITH A MEAL 180 tablet 0   nicotine  (NICODERM CQ) 14 mg/24hr patch Place 1 patch (14 mg total) onto the skin daily. 28 patch 0   nystatin (MYCOSTATIN) 100000 UNIT/ML suspension Take 5 mLs (500,000 Units total) by mouth 4 (four) times daily. 60 mL 2   omeprazole (PRILOSEC) 20 MG capsule Take 20 mg by mouth daily.  6   potassium chloride SA (KLOR-CON M) 20 MEQ tablet Take 1 tablet (20 mEq total) by mouth daily. 7 tablet 0   potassium chloride SA (KLOR-CON M) 20 MEQ tablet Take 1 tablet (20 mEq total) by mouth 2 (two) times daily. 14 tablet 0   prednisoLONE acetate (PRED FORTE) 1 % ophthalmic suspension Place 1 drop into the right eye 4 (four) times daily.     Tiotropium Bromide Monohydrate (SPIRIVA RESPIMAT) 2.5 MCG/ACT AERS Inhale 2 puffs into the lungs daily. 4 g 12   No current facility-administered medications for this encounter.    ECOG PERFORMANCE STATUS:  0 - Asymptomatic  REVIEW OF SYSTEMS: Patient denies any weight loss, fatigue, weakness, fever, chills or night sweats. Patient denies any loss of vision, blurred vision. Patient denies any ringing  of the ears or hearing loss. No irregular heartbeat. Patient denies heart murmur or history of fainting. Patient denies any chest pain or pain radiating to her upper extremities. Patient denies any shortness of breath, difficulty breathing at night, cough or hemoptysis. Patient denies any swelling in the lower legs. Patient denies any nausea vomiting, vomiting of blood, or coffee ground material in the vomitus. Patient denies any stomach pain. Patient states has had normal bowel movements no significant constipation or diarrhea. Patient denies any dysuria, hematuria or significant nocturia. Patient denies any problems walking, swelling in the joints or loss of balance. Patient denies any skin changes, loss of hair or loss of weight. Patient denies any excessive worrying or anxiety or significant depression. Patient denies any problems with insomnia. Patient denies excessive thirst,  polyuria, polydipsia. Patient denies any swollen glands, patient denies easy bruising or easy bleeding. Patient denies any recent infections, allergies or URI. Patient "s visual fields have not changed significantly in recent time.   PHYSICAL EXAM: BP 110/77   Pulse 93   Temp 98.7 F (37.1 C) (Tympanic)   Resp 16   Wt 114 lb (51.7 kg)   BMI 21.54 kg/m  Well-developed well-nourished patient in NAD. HEENT reveals PERLA, EOMI, discs not visualized.  Oral cavity is clear. No oral mucosal lesions are identified. Neck is clear without evidence of cervical or supraclavicular adenopathy. Lungs are clear to A&P. Cardiac examination is essentially unremarkable with regular rate and rhythm without murmur rub or thrill. Abdomen is benign with no organomegaly or masses noted. Motor sensory and DTR levels are equal and symmetric in the upper and lower extremities. Cranial nerves II through XII are grossly intact. Proprioception is intact. No peripheral adenopathy or edema is identified. No motor or sensory levels are noted. Crude visual fields are within normal range.  LABORATORY DATA: Pathology cytology  reports reviewed    RADIOLOGY RESULTS: CT scan and PET scan reviewed compatible with above-stated findings   IMPRESSION: Stage I non-small cell lung cancer adenocarcinoma the right upper lobe in 58 year old female nonsurgical candidate based on COPD emphysema  PLAN: At this time of offered SBRT to her right upper lobe lesion.  Will plan on delivering 60 Gray in 5 fractions.  Risks and benefits of treatment including possible development of a slight cough fatigue slight chance of skin reaction all were reviewed in detail with the patient.  I have personally set up and ordered CT simulation for next week.  Patient comprehends her recommendations well.  She has consented to treatment.  I would like to take this opportunity to thank you for allowing me to participate in the care of your patient.Carmina Miller, MD

## 2023-09-10 NOTE — Progress Notes (Signed)
 Met with patient during initial consult with Dr. Rushie Chestnut to discuss radiation treatment. All questions answered during visit. Reviewed upcoming appts. Instructed to call with any questions or needs. Pt verbalized understanding. Nothing further needed at this time.

## 2023-09-14 ENCOUNTER — Ambulatory Visit
Admission: RE | Admit: 2023-09-14 | Discharge: 2023-09-14 | Disposition: A | Discharge status: Home / Self Care | Source: Ambulatory Visit | Attending: Radiation Oncology | Admitting: Radiation Oncology

## 2023-09-14 ENCOUNTER — Encounter: Payer: Self-pay | Admitting: *Deleted

## 2023-09-14 DIAGNOSIS — Z87891 Personal history of nicotine dependence: Secondary | ICD-10-CM | POA: Diagnosis not present

## 2023-09-14 DIAGNOSIS — C3411 Malignant neoplasm of upper lobe, right bronchus or lung: Secondary | ICD-10-CM | POA: Diagnosis not present

## 2023-09-15 DIAGNOSIS — Z87891 Personal history of nicotine dependence: Secondary | ICD-10-CM | POA: Diagnosis not present

## 2023-09-15 DIAGNOSIS — C3411 Malignant neoplasm of upper lobe, right bronchus or lung: Secondary | ICD-10-CM | POA: Diagnosis not present

## 2023-09-17 ENCOUNTER — Institutional Professional Consult (permissible substitution): Admitting: Radiation Oncology

## 2023-09-26 DIAGNOSIS — W19XXXA Unspecified fall, initial encounter: Secondary | ICD-10-CM | POA: Diagnosis not present

## 2023-09-26 DIAGNOSIS — Z743 Need for continuous supervision: Secondary | ICD-10-CM | POA: Diagnosis not present

## 2023-09-26 DIAGNOSIS — M50322 Other cervical disc degeneration at C5-C6 level: Secondary | ICD-10-CM | POA: Diagnosis not present

## 2023-09-26 DIAGNOSIS — W01198A Fall on same level from slipping, tripping and stumbling with subsequent striking against other object, initial encounter: Secondary | ICD-10-CM | POA: Diagnosis not present

## 2023-09-26 DIAGNOSIS — S199XXA Unspecified injury of neck, initial encounter: Secondary | ICD-10-CM | POA: Diagnosis not present

## 2023-09-26 DIAGNOSIS — S0083XA Contusion of other part of head, initial encounter: Secondary | ICD-10-CM | POA: Diagnosis not present

## 2023-09-26 DIAGNOSIS — M47812 Spondylosis without myelopathy or radiculopathy, cervical region: Secondary | ICD-10-CM | POA: Diagnosis not present

## 2023-09-26 DIAGNOSIS — Z043 Encounter for examination and observation following other accident: Secondary | ICD-10-CM | POA: Diagnosis not present

## 2023-09-26 DIAGNOSIS — S01111A Laceration without foreign body of right eyelid and periocular area, initial encounter: Secondary | ICD-10-CM | POA: Diagnosis not present

## 2023-09-26 DIAGNOSIS — G319 Degenerative disease of nervous system, unspecified: Secondary | ICD-10-CM | POA: Diagnosis not present

## 2023-09-26 DIAGNOSIS — M4802 Spinal stenosis, cervical region: Secondary | ICD-10-CM | POA: Diagnosis not present

## 2023-09-26 DIAGNOSIS — R58 Hemorrhage, not elsewhere classified: Secondary | ICD-10-CM | POA: Diagnosis not present

## 2023-09-26 DIAGNOSIS — R0902 Hypoxemia: Secondary | ICD-10-CM | POA: Diagnosis not present

## 2023-09-29 ENCOUNTER — Encounter: Payer: Self-pay | Admitting: *Deleted

## 2023-09-29 ENCOUNTER — Ambulatory Visit
Admission: RE | Admit: 2023-09-29 | Discharge: 2023-09-29 | Disposition: A | Discharge status: Home / Self Care | Source: Ambulatory Visit | Attending: Radiation Oncology | Admitting: Radiation Oncology

## 2023-09-29 ENCOUNTER — Other Ambulatory Visit: Payer: Self-pay

## 2023-09-29 DIAGNOSIS — C3411 Malignant neoplasm of upper lobe, right bronchus or lung: Secondary | ICD-10-CM | POA: Diagnosis not present

## 2023-09-29 LAB — RAD ONC ARIA SESSION SUMMARY
Course Elapsed Days: 0
Plan Fractions Treated to Date: 1
Plan Prescribed Dose Per Fraction: 12 Gy
Plan Total Fractions Prescribed: 5
Plan Total Prescribed Dose: 60 Gy
Reference Point Dosage Given to Date: 12 Gy
Reference Point Session Dosage Given: 12 Gy
Session Number: 1

## 2023-09-30 ENCOUNTER — Encounter: Payer: Self-pay | Admitting: *Deleted

## 2023-09-30 ENCOUNTER — Telehealth: Payer: Self-pay | Admitting: *Deleted

## 2023-09-30 NOTE — Telephone Encounter (Signed)
-----   Message from Rickard Patience sent at 09/29/2023 11:09 PM EDT ----- Regarding: RE: Follow up Please send off NGS on send NGS on [id number] Please arrange her to see me in 6 months. MD only. Thanks.   zy ----- Message ----- From: Glory Buff, RN Sent: 09/29/2023   1:08 PM EDT To: Rickard Patience, MD Subject: Follow up                                      Pt received her first radiation today and will finish on 4/22. She currently does not have any follow up with you scheduled. When would you like to see her for follow up?  [facility]

## 2023-09-30 NOTE — Telephone Encounter (Signed)
 Order for [employer] placed for specimen [phone] per Dr. Cathie Hoops. Financial assistance application will be completed at next radiation treatment. Schedule message sent to schedule follow up appt. Nothing further needed at this time.

## 2023-10-01 ENCOUNTER — Ambulatory Visit
Admission: RE | Admit: 2023-10-01 | Discharge: 2023-10-01 | Disposition: A | Discharge status: Home / Self Care | Source: Ambulatory Visit | Attending: Radiation Oncology | Admitting: Radiation Oncology

## 2023-10-01 ENCOUNTER — Encounter: Payer: Self-pay | Admitting: *Deleted

## 2023-10-01 ENCOUNTER — Other Ambulatory Visit: Payer: Self-pay

## 2023-10-01 DIAGNOSIS — C3411 Malignant neoplasm of upper lobe, right bronchus or lung: Secondary | ICD-10-CM | POA: Diagnosis not present

## 2023-10-01 LAB — RAD ONC ARIA SESSION SUMMARY
Course Elapsed Days: 2
Plan Fractions Treated to Date: 2
Plan Prescribed Dose Per Fraction: 12 Gy
Plan Total Fractions Prescribed: 5
Plan Total Prescribed Dose: 60 Gy
Reference Point Dosage Given to Date: 24 Gy
Reference Point Session Dosage Given: 12 Gy
Session Number: 2

## 2023-10-01 NOTE — Progress Notes (Signed)
 Met with patient briefly after radiation treatment today to follow up regarding recent episode of nausea. Pt states that she is feeling a little better today. Recently started taking an antibiotic which she believes could be causing her nausea. Encouraged pt to take antibiotic with food to help minimize nausea. Instructed to call with any questions or needs. Pt verbalized understanding.   [employer] financial assistance application completed during visit. Approved for $0 out of pocket expense.

## 2023-10-03 DIAGNOSIS — Z419 Encounter for procedure for purposes other than remedying health state, unspecified: Secondary | ICD-10-CM | POA: Diagnosis not present

## 2023-10-05 ENCOUNTER — Ambulatory Visit: Admitting: Family Medicine

## 2023-10-05 ENCOUNTER — Encounter: Payer: Self-pay | Admitting: Family Medicine

## 2023-10-05 VITALS — BP 122/70 | HR 95 | Ht 61.0 in | Wt 112.4 lb

## 2023-10-05 DIAGNOSIS — Z4802 Encounter for removal of sutures: Secondary | ICD-10-CM

## 2023-10-05 DIAGNOSIS — C3491 Malignant neoplasm of unspecified part of right bronchus or lung: Secondary | ICD-10-CM | POA: Diagnosis not present

## 2023-10-05 DIAGNOSIS — Z87891 Personal history of nicotine dependence: Secondary | ICD-10-CM | POA: Diagnosis not present

## 2023-10-05 NOTE — Progress Notes (Signed)
 Subjective:    Patient ID: Joy Ball, female    DOB: 1966/02/13, 58 y.o.   MRN: [id number]  Joy Ball is a 58 y.o. female presenting on 10/05/2023 for Suture / Staple Removal   HPI  Discussed the use of AI scribe software for clinical note transcription with the patient, who gave verbal consent to proceed.  History of Present Illness   Joy Ball is a 58 year old female who presents for suture removal.  She recently experienced a fall at her son's house in [place], resulting in a laceration above her right eye after hitting her head on a door hinge. This incident occurred on September 26, 2023, the day after her son's birthday. She received five stitches for the laceration and was prescribed antibiotics to prevent infection. A CT scan was performed, which showed no significant issues. She experienced bruising and swelling, including a black eye, but the wound has healed well with no ongoing bleeding or oozing.  - 5 stitches placed at that time.  She has stage 1 lung cancer and is under the care of [facility] and an oncologist team, including Dr. Aggie Cosier. She was diagnosed after a PET scan, which was delayed due to insurance issues. Surgical intervention was not pursued due to significant lung scarring and fibrosis, which would have necessitated lifelong oxygen use. Instead, she is undergoing radiation therapy.  In terms of her social history, she quit smoking in September 2024, which is a significant lifestyle change given her lung condition.      Following with [facility] for adenocarcinoma R lung Pulm fibrosis cannot have surgery Also following with [facility] Oncology Team Dr Cathie Hoops + Dr Rushie Chestnut      10/05/2023    9:05 AM 11/11/2022    2:00 PM 06/21/2019   11:04 AM  Depression screen PHQ 2/9  Decreased Interest 0 0 0  Down, Depressed, Hopeless 0 0 0  PHQ - 2 Score 0 0 0  Altered sleeping 2    Tired, decreased energy 3    Change in appetite 0    Feeling bad  or failure about yourself  0    Trouble concentrating 0    Moving slowly or fidgety/restless 0    Suicidal thoughts 0    PHQ-9 Score 5    Difficult doing work/chores Somewhat difficult         10/05/2023    9:05 AM 11/11/2022    2:00 PM  GAD 7 : Generalized Anxiety Score  Nervous, Anxious, on Edge 1 0  Control/stop worrying 1 0  Worry too much - different things 2 0  Trouble relaxing 0 0  Restless 0 0  Easily annoyed or irritable 1 0  Afraid - awful might happen 2 0  Total GAD 7 Score 7 0  Anxiety Difficulty Somewhat difficult     Social History   Tobacco Use   Smoking status: Former    Current packs/day: 0.00    Average packs/day: 2.0 packs/day for 39.7 years (79.3 ttl pk-yrs)    Types: Cigarettes    Start date: 20    Quit date: 02/22/2023    Years since quitting: 0.6   Smokeless tobacco: Current  Substance Use Topics   Alcohol use: Yes    Alcohol/week: 0.0 standard drinks of alcohol    Comment: occ   Drug use: No    Review of Systems Per HPI unless specifically indicated above     Objective:  BP 122/70 (BP Location: Right Arm, Patient Position: Sitting, Cuff Size: Normal)   Pulse 95   Ht 5\' 1"  (1.549 m)   Wt 112 lb 6 oz (51 kg)   SpO2 97%   BMI 21.23 kg/m   Wt Readings from Last 3 Encounters:  10/05/23 112 lb 6 oz (51 kg)  09/10/23 114 lb (51.7 kg)  09/04/23 112 lb (50.8 kg)    Physical Exam Vitals and nursing note reviewed.  Constitutional:      General: She is not in acute distress.    Appearance: Normal appearance. She is well-developed. She is not diaphoretic.     Comments: Well-appearing, comfortable, cooperative  HENT:     Head: Normocephalic.     Comments: R eyebrow lateral with healed laceration with 5 simple interrupted blue prolene sutures in place Eyes:     General:        Right eye: No discharge.        Left eye: No discharge.     Conjunctiva/sclera: Conjunctivae normal.  Cardiovascular:     Rate and Rhythm: Normal rate.   Pulmonary:     Effort: Pulmonary effort is normal.  Skin:    General: Skin is warm and dry.     Findings: No erythema or rash.  Neurological:     Mental Status: She is alert and oriented to person, place, and time.  Psychiatric:        Mood and Affect: Mood normal.        Behavior: Behavior normal.        Thought Content: Thought content normal.     Comments: Well groomed, good eye contact, normal speech and thoughts     ________________________________________________________ PROCEDURE NOTE Date - 10/05/23 Suture Removal - Right Eyebrow location - Location / Date Sutures were placed: 09/26/23 - # of Sutures: 5 Discussed benefits and risks (including pain, bleeding, infection, wound separation). Verbal consent given by patient Medication:  None  Time Out taken Examination of the sutured laceration on R Eyebrow appears to be well healed. Area cleansed with alcohol wipes. Appropriate # of sutures counted and confirmed. Removal of sutures one by one using sterile pick ups to adjust and sterile scissors to cut one side of suture away from knot. Removal was uncomplicated. - All sutures were successfully removed and wound remains intact - 5 out of 5 sutures were successfully removed, remaining sutures were left in place due to concern about integrity of the wound and recommend further healing before removal. See A&P.   Results for orders placed or performed in visit on 10/01/23  Rad Onc Aria Session Summary   Collection Time: 10/01/23 11:48 AM  Result Value Ref Range   Course ID [email or web address]    Course Start Date 09/14/2023    Session Number 2    Course First Treatment Date 09/29/2023  1:13 PM    Course Last Treatment Date 10/01/2023 11:46 AM    Course Elapsed Days 2    Reference Point ID Lung_R_SBRT    Reference Point Dosage Given to Date 24 Gy   Reference Point Session Dosage Given 12 Gy   Plan ID Lung_R_SBRT    Plan Fractions Treated to Date 2    Plan Total Fractions Prescribed 5     Plan Prescribed Dose Per Fraction 12 Gy   Plan Total Prescribed Dose 60.000000 Gy   Plan Primary Reference Point Lung_R_SBRT       Assessment & Plan:   Problem List Items Addressed This Visit  Adenocarcinoma of right lung (HCC) - Primary (Chronic)   Other Visit Diagnoses       Encounter for removal of sutures         Former smoker            Adenocarcinoma R side Lung Cancer Stage I Followed by [facility] cancer center + [facility] Stage I lung cancer with contraindication for surgery due to lung scarring and fibrosis Pursuing radiation therapy  Head Laceration Head laceration above right eye treated with antibiotics and sutures. Healing well without infection. - Advise Neosporin and soap and water for wound care.  All sutures removed today 5 out of 5. Tolerated well  Smoking Cessation Successfully quit smoking in September 2024, improving lung condition.        No orders of the defined types were placed in this encounter.   No orders of the defined types were placed in this encounter.   Follow up plan: Return if symptoms worsen or fail to improve.   Domingo Friend, DO [facility] 10/05/2023, 9:13 AM

## 2023-10-05 NOTE — Patient Instructions (Addendum)
 Thank you for coming to the office today.  Suture removal Okay to use neosporin Soap and water routine wound care It will heal well  Please schedule a Follow-up Appointment to: No follow-ups on file.  If you have any other questions or concerns, please feel free to call the office or send a message through MyChart. You may also schedule an earlier appointment if necessary.  Additionally, you may be receiving a survey about your experience at our office within a few days to 1 week by e-mail or mail. We value your feedback.  Domingo Friend, DO [facility], [place]

## 2023-10-06 ENCOUNTER — Other Ambulatory Visit: Payer: Self-pay

## 2023-10-06 ENCOUNTER — Ambulatory Visit
Admission: RE | Admit: 2023-10-06 | Discharge: 2023-10-06 | Disposition: A | Discharge status: Home / Self Care | Source: Ambulatory Visit | Attending: Radiation Oncology | Admitting: Radiation Oncology

## 2023-10-06 DIAGNOSIS — C3411 Malignant neoplasm of upper lobe, right bronchus or lung: Secondary | ICD-10-CM | POA: Diagnosis not present

## 2023-10-06 LAB — RAD ONC ARIA SESSION SUMMARY
Course Elapsed Days: 7
Plan Fractions Treated to Date: 3
Plan Prescribed Dose Per Fraction: 12 Gy
Plan Total Fractions Prescribed: 5
Plan Total Prescribed Dose: 60 Gy
Reference Point Dosage Given to Date: 36 Gy
Reference Point Session Dosage Given: 12 Gy
Session Number: 3

## 2023-10-08 ENCOUNTER — Ambulatory Visit
Admission: RE | Admit: 2023-10-08 | Discharge: 2023-10-08 | Disposition: A | Discharge status: Home / Self Care | Source: Ambulatory Visit | Attending: Radiation Oncology | Admitting: Radiation Oncology

## 2023-10-08 ENCOUNTER — Other Ambulatory Visit: Payer: Self-pay

## 2023-10-08 DIAGNOSIS — C3411 Malignant neoplasm of upper lobe, right bronchus or lung: Secondary | ICD-10-CM | POA: Diagnosis not present

## 2023-10-08 LAB — RAD ONC ARIA SESSION SUMMARY
Course Elapsed Days: 9
Plan Fractions Treated to Date: 4
Plan Prescribed Dose Per Fraction: 12 Gy
Plan Total Fractions Prescribed: 5
Plan Total Prescribed Dose: 60 Gy
Reference Point Dosage Given to Date: 48 Gy
Reference Point Session Dosage Given: 12 Gy
Session Number: 4

## 2023-10-13 ENCOUNTER — Ambulatory Visit
Admission: RE | Admit: 2023-10-13 | Discharge: 2023-10-13 | Disposition: A | Discharge status: Home / Self Care | Source: Ambulatory Visit | Attending: Radiation Oncology | Admitting: Radiation Oncology

## 2023-10-13 ENCOUNTER — Other Ambulatory Visit: Payer: Self-pay

## 2023-10-13 DIAGNOSIS — C3411 Malignant neoplasm of upper lobe, right bronchus or lung: Secondary | ICD-10-CM | POA: Diagnosis not present

## 2023-10-13 LAB — RAD ONC ARIA SESSION SUMMARY
Course Elapsed Days: 14
Plan Fractions Treated to Date: 5
Plan Prescribed Dose Per Fraction: 12 Gy
Plan Total Fractions Prescribed: 5
Plan Total Prescribed Dose: 60 Gy
Reference Point Dosage Given to Date: 60 Gy
Reference Point Session Dosage Given: 12 Gy
Session Number: 5

## 2023-10-14 NOTE — Radiation Completion Notes (Signed)
 Patient Name: Joy Ball, Joy Ball MRN: [id number] Date of Birth: 11/11/1965 Referring Physician: Domingo Friend, M.D. Date of Service: 2023-10-14 Radiation Oncologist: Glenis Langdon, M.D. [facility]                             RADIATION ONCOLOGY END OF TREATMENT NOTE     Diagnosis: C34.11 Malignant neoplasm of upper lobe, right bronchus or lung Staging on 2023-08-19: Adenocarcinoma of right lung (HCC) T=cT1b, N=cN0, M=cM0 Intent: Curative     HPI: Patient is a 58 year old female followed for right upper lobe lesion over the past year.  She was noted to have a 1.9 cm right upper lobe nodule along the inferior margin of the right upper lobe.  She is extensive centrilobular emphysema.  PET CT scan was performed again showing 1.8 cm nodule lesion along the inferior margin of the pleural-parenchymal scarring in the right upper lobe abutting the major fissure suspicious for malignancy.  There were no findings to suggest distant metastatic disease.  Biopsy of the right upper lobe lesion was positive for adenocarcinoma.  She has been seen by Dr. Deloise Ferries cardiothoracic surgeon who is declined surgery based on her poor pulmonary function test including a DLCO which was only 35%.  She is seen today for consideration of SBRT treatment.  She does have a chronic nonproductive cough does have dyspnea on exertion.  She specifically did not Nu[place] hemoptysis or dysphagia.      ==========DELIVERED PLANS==========  First Treatment Date: 2023-09-29 Last Treatment Date: 2023-10-13   Plan Name: Lung_R_SBRT Site: Lung, Right Technique: SBRT/SRT-IMRT Mode: Photon Dose Per Fraction: 12 Gy Prescribed Dose (Delivered / Prescribed): 60 Gy / 60 Gy Prescribed Fxs (Delivered / Prescribed): 5 / 5     ==========ON TREATMENT VISIT DATES========== 2023-09-29, 2023-10-01, 2023-10-06, 2023-10-06, 2023-10-08, 2023-10-13     ==========UPCOMING VISITS==========       ==========APPENDIX -  ON TREATMENT VISIT NOTES==========   See weekly On Treatment Notes in Epic for details in the Media tab (listed as Progress notes on the On Treatment Visit Dates listed above).

## 2023-10-15 ENCOUNTER — Encounter: Payer: Self-pay | Admitting: Oncology

## 2023-10-23 ENCOUNTER — Other Ambulatory Visit: Payer: Self-pay | Admitting: Family Medicine

## 2023-10-23 DIAGNOSIS — S93602A Unspecified sprain of left foot, initial encounter: Secondary | ICD-10-CM | POA: Diagnosis not present

## 2023-10-23 DIAGNOSIS — B370 Candidal stomatitis: Secondary | ICD-10-CM

## 2023-10-27 NOTE — Telephone Encounter (Signed)
 Requested medications are due for refill today.  Provider to determine  Requested medications are on the active medications list.  yes  Last refill. 02/17/2023 60mL 2 rf  Future visit scheduled.   no  Notes to clinic.  Medication not assigned to a protocol. Please review for refill.    Requested Prescriptions  Pending Prescriptions Disp Refills   nystatin  (MYCOSTATIN ) 100000 UNIT/ML suspension [Pharmacy Med Name: NYSTATIN  ORAL SUSP 100000 UNIT/ML] 60 mL 2    Sig: SHAKE LIQUID AND TAKE 5 ML BY MOUTH FOUR TIMES DAILY     Off-Protocol Failed - 10/27/2023  7:50 AM      Failed - Medication not assigned to a protocol, review manually.      Failed - Valid encounter within last 12 months    Recent Outpatient Visits           3 weeks ago Adenocarcinoma of right lung [facility])   [facility], Kayleen Party, [place]

## 2023-11-02 DIAGNOSIS — Z419 Encounter for procedure for purposes other than remedying health state, unspecified: Secondary | ICD-10-CM | POA: Diagnosis not present

## 2023-11-06 DIAGNOSIS — S93602D Unspecified sprain of left foot, subsequent encounter: Secondary | ICD-10-CM | POA: Diagnosis not present

## 2023-11-11 ENCOUNTER — Other Ambulatory Visit: Payer: Self-pay | Admitting: Family Medicine

## 2023-11-11 DIAGNOSIS — M7712 Lateral epicondylitis, left elbow: Secondary | ICD-10-CM

## 2023-11-11 DIAGNOSIS — G8929 Other chronic pain: Secondary | ICD-10-CM

## 2023-11-12 ENCOUNTER — Other Ambulatory Visit: Payer: Self-pay | Admitting: *Deleted

## 2023-11-12 ENCOUNTER — Ambulatory Visit
Admission: RE | Admit: 2023-11-12 | Discharge: 2023-11-12 | Disposition: A | Discharge status: Home / Self Care | Source: Ambulatory Visit | Attending: Radiation Oncology | Admitting: Radiation Oncology

## 2023-11-12 ENCOUNTER — Encounter: Payer: Self-pay | Admitting: Radiation Oncology

## 2023-11-12 ENCOUNTER — Other Ambulatory Visit: Payer: Self-pay | Admitting: Family Medicine

## 2023-11-12 VITALS — BP 119/84 | HR 92 | Resp 14 | Ht 61.0 in | Wt 117.0 lb

## 2023-11-12 DIAGNOSIS — J4489 Other specified chronic obstructive pulmonary disease: Secondary | ICD-10-CM

## 2023-11-12 DIAGNOSIS — R053 Chronic cough: Secondary | ICD-10-CM

## 2023-11-12 DIAGNOSIS — C3491 Malignant neoplasm of unspecified part of right bronchus or lung: Secondary | ICD-10-CM | POA: Insufficient documentation

## 2023-11-12 DIAGNOSIS — J8410 Pulmonary fibrosis, unspecified: Secondary | ICD-10-CM

## 2023-11-12 NOTE — Progress Notes (Signed)
 Radiation Oncology Follow up Note  Name: Joy Ball   Date:   11/12/2023 MRN:  [id number] DOB: [birth year]    This 58 y.o. female presents to the clinic today for 1 month status post SBRT to her right upper lobe for stage Ia (cT1b N0 M0) non-small cell lung cancer favoring adenocarcinoma.  REFERRING PROVIDER: Domingo Friend *  HPI: Patient is a 58 year old female now out 1 month having completed SBRT to her right upper lobe for stage Ia adenocarcinoma.  Seen today in routine follow-up she is doing well she does have mild cough clear no evidence of hemoptysis.  Note significant change in her pulmonary status no chest tightness.  She recently is twisted her ankle chasing her dog and is now in a boot..  COMPLICATIONS OF TREATMENT: none  FOLLOW UP COMPLIANCE: Fine  PHYSICAL EXAM:  BP 119/84   Pulse 92   Resp 14   Ht 5\' 1"  (1.549 m)   Wt 117 lb (53.1 kg)   BMI 22.11 kg/m  Well-developed well-nourished patient in NAD. HEENT reveals PERLA, EOMI, discs not visualized.  Oral cavity is clear. No oral mucosal lesions are identified. Neck is clear without evidence of cervical or supraclavicular adenopathy. Lungs are clear to A&P. Cardiac examination is essentially unremarkable with regular rate and rhythm without murmur rub or thrill. Abdomen is benign with no organomegaly or masses noted. Motor sensory and DTR levels are equal and symmetric in the upper and lower extremities. Cranial nerves II through XII are grossly intact. Proprioception is intact. No peripheral adenopathy or edema is identified. No motor or sensory levels are noted. Crude visual fields are within normal range.  RADIOLOGY RESULTS: CT scan of the chest ordered in 3 months  PLAN: Present time patient is doing well with a fairly low side effect profile.  She does have significant lung pathology which has been documented in the past.  I have asked to see her back in 3 months with a follow-up CT scan at that time.  Patient  knows to call with any concerns.  I would like to take this opportunity to thank you for allowing me to participate in the care of your patient.Glenis Langdon, MD

## 2023-11-12 NOTE — Telephone Encounter (Signed)
 Requested Prescriptions  Pending Prescriptions Disp Refills   gabapentin  (NEURONTIN ) 300 MG capsule [Pharmacy Med Name: GABAPENTIN  300MG  CAPSULES] 90 capsule 0    Sig: TAKE 1 CAPSULE(300 MG) BY MOUTH AT BEDTIME     Neurology: Anticonvulsants - gabapentin  Failed - 11/12/2023  4:58 PM      Failed - Valid encounter within last 12 months    Recent Outpatient Visits           1 month ago Adenocarcinoma of right lung [facility])   [facility] [place], Kayleen Party, DO              Passed - Cr in normal range and within 360 days    Creatinine  Date Value Ref Range Status  09/02/2023 0.76 0.44 - 1.00 mg/dL Final   Creat  Date Value Ref Range Status  11/28/2022 0.75 0.50 - 1.03 mg/dL Final         Passed - Completed PHQ-2 or PHQ-9 in the last 360 days

## 2023-11-13 NOTE — Telephone Encounter (Signed)
 D/C 07/16/23. Requested Prescriptions  Refused Prescriptions Disp Refills   benzonatate  (TESSALON ) 200 MG capsule [Pharmacy Med Name: BENZONATATE  200MG  CAPSULES] 60 capsule 2    Sig: TAKE 1 CAPSULE(200 MG) BY MOUTH THREE TIMES DAILY AS NEEDED FOR COUGH     There is no refill protocol information for this order

## 2023-11-14 ENCOUNTER — Other Ambulatory Visit: Payer: Self-pay | Admitting: Family Medicine

## 2023-11-14 DIAGNOSIS — J8410 Pulmonary fibrosis, unspecified: Secondary | ICD-10-CM

## 2023-11-14 DIAGNOSIS — R053 Chronic cough: Secondary | ICD-10-CM

## 2023-11-14 DIAGNOSIS — J4489 Other specified chronic obstructive pulmonary disease: Secondary | ICD-10-CM

## 2023-11-18 NOTE — Telephone Encounter (Signed)
 Requested medication (s) are due for refill today:No  Requested medication (s) are on the active medication list: No  Last refill:  Last order was 100 mg  Future visit scheduled:   Notes to clinic:  See request.    Requested Prescriptions  Pending Prescriptions Disp Refills   benzonatate  (TESSALON ) 200 MG capsule [Pharmacy Med Name: BENZONATATE  200MG  CAPSULES] 60 capsule 3    Sig: TAKE 1 CAPSULE(200 MG) BY MOUTH THREE TIMES DAILY AS NEEDED FOR COUGH     There is no refill protocol information for this order

## 2023-11-19 ENCOUNTER — Telehealth: Payer: Self-pay | Admitting: Student in an Organized Health Care Education/Training Program

## 2023-11-19 NOTE — Telephone Encounter (Signed)
 I was calling the patient to schedule her PFT appt which I did. The patient was asking if Dr. Darnelle Elders would send a refill for her Tessalon  Pearls. She stated her PCP wouldn't refill

## 2023-11-20 NOTE — Telephone Encounter (Signed)
 Per secure chat with Dr. Darnelle Elders- I wouldn't refil those, they should not be prescribed for more than a short period of time.   I left a message on her secure voicemail notifying her.  Nothing further needed.

## 2023-11-23 ENCOUNTER — Other Ambulatory Visit: Payer: Self-pay

## 2023-11-23 ENCOUNTER — Ambulatory Visit: Admitting: Student in an Organized Health Care Education/Training Program

## 2023-11-23 DIAGNOSIS — J8410 Pulmonary fibrosis, unspecified: Secondary | ICD-10-CM

## 2023-11-23 DIAGNOSIS — C3491 Malignant neoplasm of unspecified part of right bronchus or lung: Secondary | ICD-10-CM

## 2023-11-23 LAB — PULMONARY FUNCTION TEST
DL/VA % pred: 55 %
DL/VA: 2.42 ml/min/mmHg/L
DLCO unc % pred: 39 %
DLCO unc: 7.27 ml/min/mmHg
FEF 25-75 Pre: 1.21 L/s
FEF2575-%Pred-Pre: 51 %
FEV1-%Pred-Pre: 66 %
FEV1-Pre: 1.59 L
FEV1FVC-%Pred-Pre: 92 %
FEV6-%Pred-Pre: 71 %
FEV6-Pre: 2.11 L
FEV6FVC-%Pred-Pre: 103 %
FVC-%Pred-Pre: 70 %
FVC-Pre: 2.17 L
Pre FEV1/FVC ratio: 73 %
Pre FEV6/FVC Ratio: 100 %
RV % pred: 80 %
RV: 1.42 L
TLC % pred: 77 %
TLC: 3.55 L

## 2023-11-23 NOTE — Patient Instructions (Signed)
 Full PFT completed today without post.

## 2023-11-23 NOTE — Progress Notes (Signed)
 Full PFT completed today without post per original PFT order.

## 2023-11-24 DIAGNOSIS — S93602D Unspecified sprain of left foot, subsequent encounter: Secondary | ICD-10-CM | POA: Diagnosis not present

## 2023-11-27 ENCOUNTER — Encounter: Payer: Self-pay | Admitting: Student in an Organized Health Care Education/Training Program

## 2023-11-27 ENCOUNTER — Ambulatory Visit: Payer: Medicaid Other | Admitting: Student in an Organized Health Care Education/Training Program

## 2023-11-27 VITALS — BP 118/80 | HR 94 | Temp 97.1°F | Ht 61.0 in | Wt 115.0 lb

## 2023-11-27 DIAGNOSIS — Z87891 Personal history of nicotine dependence: Secondary | ICD-10-CM | POA: Diagnosis not present

## 2023-11-27 DIAGNOSIS — C3491 Malignant neoplasm of unspecified part of right bronchus or lung: Secondary | ICD-10-CM

## 2023-11-27 DIAGNOSIS — J432 Centrilobular emphysema: Secondary | ICD-10-CM | POA: Diagnosis not present

## 2023-11-27 DIAGNOSIS — J8410 Pulmonary fibrosis, unspecified: Secondary | ICD-10-CM | POA: Diagnosis not present

## 2023-11-27 NOTE — Progress Notes (Signed)
 Assessment & Plan:   #Pulmonary fibrosis (HCC) (Primary)  Initially presented for the evaluation of shortness of breath with findings of subpleural reticulation and emphysema on chest imaging. She had high-resolution chest CT in July 2024 showing a pattern consistent with UIP. This could represent UIP secondary to IPF but could also be due to chronic hypersensitivity pneumonitis given significant mold exposure. CPFE is also on the differential.   Auto-immune workup was essentially non-revealing, with only a very mildly elevated ANA of 1:40 of unclear significance but otherwise negative titers (ANA reflex, CCP, RF, myomarker). TTE did not show any signs of pulmonary hypertension or RV dysfunction. She did not undergo pulmonary resection and we don't have a pathologic diagnosis.   I will refer the patient for a second opinion in ILD clinic with Dr. Bertrum Brodie for consideration of initiation of antifibrotics. She would not be a candidate for immune suppression at this time given her very recent diagnosis of malignancy.  -refer for second opinion at ILD clinic  #Primary adenocarcinoma of right lung [facility])  Following for Stage IA2 RUL adenocarcinoma of the lung, now s/p radiation therapy. She is followed closely by oncology and radiation oncology. EBUS to stations 11L, 7, 4R and 11R were negative for malignancy. Stage I-A2 (T1b, N0, M0).  #Centrilobular emphysema (HCC)  PFT's are consistent with COPD. While ratio is 0.73, she has a significant reduction in her FVC, and this pattern of PRISM  likely represents COPD GOLD 2B (FEV1 of 1.59L, 66% predicted). TLC is mildly reduced, while DLCO is significantly reduced at 39% predicted. She's successfully quit smoking.  -continue triple therapy with Breztri   Return in about 6 months (around 05/28/2024).  I spent 35 minutes caring for this patient today, including preparing to see the patient, obtaining a medical history , reviewing a separately obtained  history, performing a medically appropriate examination and/or evaluation, counseling and educating the patient/family/caregiver, ordering medications, tests, or procedures, documenting clinical information in the electronic health record, and independently interpreting results (not separately reported/billed) and communicating results to the patient/family/caregiver  Vergia Glasgow, MD [facility]  End of visit medications:  No orders of the defined types were placed in this encounter.    Current Outpatient Medications:    acetaminophen  (TYLENOL ) 500 MG tablet, Take 1,500 mg by mouth 3 (three) times daily. 2 to 3 times a day., Disp: , Rfl:    albuterol  (VENTOLIN  HFA) 108 (90 Base) MCG/ACT inhaler, Inhale 2 puffs into the lungs every 6 (six) hours as needed for wheezing or shortness of breath., Disp: 8 g, Rfl: 5   benzonatate  (TESSALON ) 200 MG capsule, TAKE 1 CAPSULE(200 MG) BY MOUTH THREE TIMES DAILY AS NEEDED FOR COUGH, Disp: 60 capsule, Rfl: 3   budesonide -formoterol  (SYMBICORT ) 160-4.5 MCG/ACT inhaler, Inhale 2 puffs into the lungs daily., Disp: 1 each, Rfl: 12   buPROPion  (WELLBUTRIN  SR) 150 MG 12 hr tablet, TAKE 1 TABLET(150 MG) BY MOUTH TWICE DAILY, Disp: 180 tablet, Rfl: 0   fluticasone  (FLONASE ) 50 MCG/ACT nasal spray, Place 2 sprays into both nostrils daily., Disp: 16 g, Rfl: 0   gabapentin  (NEURONTIN ) 300 MG capsule, TAKE 1 CAPSULE(300 MG) BY MOUTH AT BEDTIME, Disp: 90 capsule, Rfl: 0   ipratropium (ATROVENT ) 0.03 % nasal spray, Place 2 sprays into both nostrils every 12 (twelve) hours., Disp: 30 mL, Rfl: 0   loratadine  (CLARITIN ) 10 MG tablet, Take 10 mg by mouth daily., Disp: , Rfl:    moxifloxacin (VIGAMOX) 0.5 % ophthalmic solution, Place  1 drop into the right eye 4 (four) times daily., Disp: , Rfl:    naproxen  (NAPROSYN ) 500 MG tablet, TAKE 1 TABLET(500 MG) BY MOUTH TWICE DAILY WITH A MEAL, Disp: 180 tablet, Rfl: 0   nicotine  (NICODERM CQ ) 14 mg/24hr patch,  Place 1 patch (14 mg total) onto the skin daily., Disp: 28 patch, Rfl: 0   nystatin  (MYCOSTATIN ) 100000 UNIT/ML suspension, SHAKE LIQUID AND TAKE 5 ML BY MOUTH FOUR TIMES DAILY, Disp: 60 mL, Rfl: 2   omeprazole (PRILOSEC) 20 MG capsule, Take 20 mg by mouth daily., Disp: , Rfl: 6   potassium chloride  SA (KLOR-CON  M) 20 MEQ tablet, Take 1 tablet (20 mEq total) by mouth daily., Disp: 7 tablet, Rfl: 0   potassium chloride  SA (KLOR-CON  M) 20 MEQ tablet, Take 1 tablet (20 mEq total) by mouth 2 (two) times daily., Disp: 14 tablet, Rfl: 0   prednisoLONE acetate (PRED FORTE) 1 % ophthalmic suspension, Place 1 drop into the right eye 4 (four) times daily., Disp: , Rfl:    Tiotropium Bromide  Monohydrate (SPIRIVA  RESPIMAT) 2.5 MCG/ACT AERS, Inhale 2 puffs into the lungs daily., Disp: 4 g, Rfl: 12   baclofen  (LIORESAL ) 10 MG tablet, TAKE 1/2 TO 1 TABLET(5 TO 10 MG) BY MOUTH THREE TIMES DAILY AS NEEDED FOR MUSCLE SPASMS (Patient not taking: Reported on 11/27/2023), Disp: 90 tablet, Rfl: 3   Subjective:   PATIENT ID: Joy Ball GENDER: female DOB: [birth year], MRN: [id number]  Chief Complaint  Patient presents with   Follow-up    DOE. Cough. No wheezing.    HPI  Patient is a pleasant 58 year old female presenting for follow up.  Patient has been following in our clinic for ILD as well as a lung nodule. She underwent robotic assisted navigational bronchoscopy for biopsy of the RUL nodule in addition to EBUS/TBNA of the hilar and mediastinal lymph nodes on 08/11/2023. Biopsy of the RUL nodule has shown adenocarcinoma, while EBUS to stations 11L, 7, 4R, and 11R superior was negative for malignancy. She has since been seen by oncology as well as by radiation oncology, and has initiated on radiation therapy of the nodule. She was seen by thoracic surgery and felt high risk candidate for surgery, so lobectomy was deferred.  She'd been seen in 2017 by Dr. Woodfin Hays, and prior to that by Dr. McQuaid. She had followed  with them for both COPD and pulmonary fibrosis. Previous workup has included an auto-immune workup in 2013 that was negative, as well as an anti-trypsin genotype that was normal. Her chest CT was from 2016 and showed bilateral lower lobe basilar reticulation. It was also notable for centrilobular emphysema.  She then established care with me in June 2024, I ordered a high-resolution chest CT that was obtained outside of our system with images ported and report noting signs of UIP as well as a potential pulmonary nodule on my review. We were in the process of obtaining a PET/CT to assess for risk of malignancy but this was delayed given insurance coverage and attempt to transition care.   Patient attempted to transition care to [facility] secondary to insurance coverage issues, but this was difficult. Following change of insurance and difficulty in transition, she re-established in our clinic in January of 2025. I ordered repeat imaging including a PET/CT and repeat chest CT showing growth in the RUL nodule as well as FDG avidity on PET.  Today, she continues to experience exertional dyspnea and a mild cough. She is compliant with her Breztri   and is using it as prescribed without any issue. She continues to be exposed to mold at her partner's house.  She is a former smoker, and has been smoke-free since September 2024. She previously smoked between 1.5 and 2 packs a day.  She continues to live in the same house with her boyfriend where there is report of mold in the bedroom (water from under the bedroom).  Ancillary information including prior medications, full medical/surgical/family/social histories, and PFTs (when available) are listed below and have been reviewed.   Review of Systems  Constitutional:  Negative for chills, fever and weight loss.  Respiratory:  Positive for shortness of breath (with exertion). Negative for cough, hemoptysis and wheezing.   Cardiovascular:  Negative for chest pain.      Objective:   Vitals:   11/27/23 0835  BP: 118/80  Pulse: 94  Temp: (!) 97.1 F (36.2 C)  SpO2: 93%  Weight: 115 lb (52.2 kg)  Height: 5\' 1"  (1.549 m)   93% on RA BMI Readings from Last 3 Encounters:  11/27/23 21.73 kg/m  11/23/23 21.54 kg/m  11/12/23 22.11 kg/m   Wt Readings from Last 3 Encounters:  11/27/23 115 lb (52.2 kg)  11/23/23 114 lb (51.7 kg)  11/12/23 117 lb (53.1 kg)    Physical Exam Constitutional:      Appearance: Normal appearance.  Cardiovascular:     Rate and Rhythm: Normal rate and regular rhythm.     Pulses: Normal pulses.     Heart sounds: Normal heart sounds.  Pulmonary:     Breath sounds: Rales (bibasilar) present. No wheezing or rhonchi.  Abdominal:     Palpations: Abdomen is soft.  Neurological:     General: No focal deficit present.     Mental Status: She is alert and oriented to person, place, and time. Mental status is at baseline.       Ancillary Information    Past Medical History:  Diagnosis Date   Asthma    COPD (chronic obstructive pulmonary disease) (HCC)    Dyspnea    Emphysema lung (HCC)    GERD (gastroesophageal reflux disease)    Lung nodule    Migraines    Seasonal allergies      Family History  Problem Relation Age of Onset   Lung cancer Father        was a smoker   Heart disease Maternal Grandfather    Emphysema Mother      Past Surgical History:  Procedure Laterality Date   CESAREAN SECTION     EYE SURGERY Bilateral 01/2023   TUBAL LIGATION      Social History   Socioeconomic History   Marital status: Widowed    Spouse name: Not on file   Number of children: 3   Years of education: Not on file   Highest education level: Some college, no degree  Occupational History   Occupation: Lexicographer houses    Employer: OTHER  Tobacco Use   Smoking status: Former    Current packs/day: 0.00    Average packs/day: 2.0 packs/day for 39.7 years (79.3 ttl pk-yrs)    Types: Cigarettes    Start date:  97    Quit date: 02/22/2023    Years since quitting: 0.7   Smokeless tobacco: Current  Substance and Sexual Activity   Alcohol use: Yes    Alcohol/week: 0.0 standard drinks of alcohol    Comment: occ   Drug use: No   Sexual activity: Not on file  Other Topics Concern   Not on file  Social History Narrative   Not on file   Social Drivers of Health   Financial Resource Strain: Not on file  Food Insecurity: Not on file  Transportation Needs: Not on file  Physical Activity: Not on file  Stress: Not on file  Social Connections: Unknown (12/22/2022)   Received from [facility], [facility]   Social Network    Social Network: Not on file  Intimate Partner Violence: Unknown (12/22/2022)   Received from [employer], [facility]   HITS    Physically Hurt: Not on file    Insult or Talk Down To: Not on file    Threaten Physical Harm: Not on file    Scream or Curse: Not on file     Allergies  Allergen Reactions   Amoxicillin Hives     CBC    Component Value Date/Time   WBC 8.5 08/19/2023 1611   WBC 12.2 (H) 11/20/2022 0933   RBC 4.24 08/19/2023 1611   HGB 15.2 (H) 08/19/2023 1611   HCT 44.1 08/19/2023 1611   PLT 320 08/19/2023 1611   MCV 104.0 (H) 08/19/2023 1611   MCH 35.8 (H) 08/19/2023 1611   MCHC 34.5 08/19/2023 1611   RDW 13.7 08/19/2023 1611   LYMPHSABS 2.8 08/19/2023 1611   MONOABS 0.8 08/19/2023 1611   EOSABS 0.1 08/19/2023 1611   BASOSABS 0.1 08/19/2023 1611    Pulmonary Functions Testing Results:    Latest Ref Rng & Units 11/23/2023   10:45 AM 08/31/2023    9:59 AM 01/01/2023    2:22 PM  PFT Results  FVC-Pre L 2.17  2.25  2.15   FVC-Predicted Pre % 70  73  69   FVC-Post L  2.26  2.37   FVC-Predicted Post %  73  77   Pre FEV1/FVC % % 73  68  66   Post FEV1/FCV % %  73  71   FEV1-Pre L 1.59  1.53  1.42   FEV1-Predicted Pre % 66  64  58   FEV1-Post L  1.65  1.67   DLCO uncorrected ml/min/mmHg 7.27  6.65  5.35   DLCO UNC% % 39  35  28   DLVA  Predicted % 55  47  33   TLC L 3.55  3.47  3.17   TLC % Predicted % 77  75  68   RV % Predicted % 80  69  69     Outpatient Medications Prior to Visit  Medication Sig Dispense Refill   acetaminophen  (TYLENOL ) 500 MG tablet Take 1,500 mg by mouth 3 (three) times daily. 2 to 3 times a day.     albuterol  (VENTOLIN  HFA) 108 (90 Base) MCG/ACT inhaler Inhale 2 puffs into the lungs every 6 (six) hours as needed for wheezing or shortness of breath. 8 g 5   benzonatate  (TESSALON ) 200 MG capsule TAKE 1 CAPSULE(200 MG) BY MOUTH THREE TIMES DAILY AS NEEDED FOR COUGH 60 capsule 3   budesonide -formoterol  (SYMBICORT ) 160-4.5 MCG/ACT inhaler Inhale 2 puffs into the lungs daily. 1 each 12   buPROPion  (WELLBUTRIN  SR) 150 MG 12 hr tablet TAKE 1 TABLET(150 MG) BY MOUTH TWICE DAILY 180 tablet 0   fluticasone  (FLONASE ) 50 MCG/ACT nasal spray Place 2 sprays into both nostrils daily. 16 g 0   gabapentin  (NEURONTIN ) 300 MG capsule TAKE 1 CAPSULE(300 MG) BY MOUTH AT BEDTIME 90 capsule 0   ipratropium (ATROVENT ) 0.03 % nasal spray Place 2  sprays into both nostrils every 12 (twelve) hours. 30 mL 0   loratadine  (CLARITIN ) 10 MG tablet Take 10 mg by mouth daily.     moxifloxacin (VIGAMOX) 0.5 % ophthalmic solution Place 1 drop into the right eye 4 (four) times daily.     naproxen  (NAPROSYN ) 500 MG tablet TAKE 1 TABLET(500 MG) BY MOUTH TWICE DAILY WITH A MEAL 180 tablet 0   nicotine  (NICODERM CQ ) 14 mg/24hr patch Place 1 patch (14 mg total) onto the skin daily. 28 patch 0   nystatin  (MYCOSTATIN ) 100000 UNIT/ML suspension SHAKE LIQUID AND TAKE 5 ML BY MOUTH FOUR TIMES DAILY 60 mL 2   omeprazole (PRILOSEC) 20 MG capsule Take 20 mg by mouth daily.  6   potassium chloride  SA (KLOR-CON  M) 20 MEQ tablet Take 1 tablet (20 mEq total) by mouth daily. 7 tablet 0   potassium chloride  SA (KLOR-CON  M) 20 MEQ tablet Take 1 tablet (20 mEq total) by mouth 2 (two) times daily. 14 tablet 0   prednisoLONE acetate (PRED FORTE) 1 % ophthalmic  suspension Place 1 drop into the right eye 4 (four) times daily.     Tiotropium Bromide  Monohydrate (SPIRIVA  RESPIMAT) 2.5 MCG/ACT AERS Inhale 2 puffs into the lungs daily. 4 g 12   baclofen  (LIORESAL ) 10 MG tablet TAKE 1/2 TO 1 TABLET(5 TO 10 MG) BY MOUTH THREE TIMES DAILY AS NEEDED FOR MUSCLE SPASMS (Patient not taking: Reported on 11/27/2023) 90 tablet 3   No facility-administered medications prior to visit.

## 2023-12-01 ENCOUNTER — Other Ambulatory Visit: Payer: Self-pay | Admitting: Family Medicine

## 2023-12-01 DIAGNOSIS — J4521 Mild intermittent asthma with (acute) exacerbation: Secondary | ICD-10-CM

## 2023-12-02 NOTE — Telephone Encounter (Signed)
 Requested Prescriptions  Pending Prescriptions Disp Refills   albuterol  (VENTOLIN  HFA) 108 (90 Base) MCG/ACT inhaler [Pharmacy Med Name: ALBUTEROL  HFA INH (200 PUFFS) 6.7GM] 6.7 g 5    Sig: INHALE 2 PUFFS INTO THE LUNGS EVERY 6 HOURS AS NEEDED FOR WHEEZING OR SHORTNESS OF BREATH     Pulmonology:  Beta Agonists 2 Failed - 12/02/2023  3:42 PM      Failed - Valid encounter within last 12 months    Recent Outpatient Visits           1 month ago Adenocarcinoma of right lung [facility])   [facility], Kayleen Party, DO              Passed - Last BP in normal range    BP Readings from Last 1 Encounters:  11/27/23 118/80         Passed - Last Heart Rate in normal range    Pulse Readings from Last 1 Encounters:  11/27/23 94

## 2023-12-03 DIAGNOSIS — Z419 Encounter for procedure for purposes other than remedying health state, unspecified: Secondary | ICD-10-CM | POA: Diagnosis not present

## 2023-12-22 DIAGNOSIS — S93602D Unspecified sprain of left foot, subsequent encounter: Secondary | ICD-10-CM | POA: Diagnosis not present

## 2023-12-22 DIAGNOSIS — W19XXXA Unspecified fall, initial encounter: Secondary | ICD-10-CM | POA: Diagnosis not present

## 2023-12-22 DIAGNOSIS — S5002XA Contusion of left elbow, initial encounter: Secondary | ICD-10-CM | POA: Diagnosis not present

## 2023-12-24 ENCOUNTER — Emergency Department

## 2023-12-24 ENCOUNTER — Inpatient Hospital Stay
Admission: EM | Admit: 2023-12-24 | Discharge: 2024-01-04 | DRG: 193 | Disposition: A | Discharge status: Home / Self Care | Attending: Internal Medicine | Admitting: Internal Medicine

## 2023-12-24 ENCOUNTER — Other Ambulatory Visit: Payer: Self-pay

## 2023-12-24 DIAGNOSIS — R079 Chest pain, unspecified: Secondary | ICD-10-CM | POA: Diagnosis not present

## 2023-12-24 DIAGNOSIS — Z880 Allergy status to penicillin: Secondary | ICD-10-CM

## 2023-12-24 DIAGNOSIS — D849 Immunodeficiency, unspecified: Secondary | ICD-10-CM | POA: Diagnosis not present

## 2023-12-24 DIAGNOSIS — R000 Tachycardia, unspecified: Secondary | ICD-10-CM | POA: Diagnosis present

## 2023-12-24 DIAGNOSIS — Z825 Family history of asthma and other chronic lower respiratory diseases: Secondary | ICD-10-CM | POA: Diagnosis not present

## 2023-12-24 DIAGNOSIS — C3411 Malignant neoplasm of upper lobe, right bronchus or lung: Secondary | ICD-10-CM | POA: Diagnosis not present

## 2023-12-24 DIAGNOSIS — Z8249 Family history of ischemic heart disease and other diseases of the circulatory system: Secondary | ICD-10-CM

## 2023-12-24 DIAGNOSIS — Z1152 Encounter for screening for COVID-19: Secondary | ICD-10-CM

## 2023-12-24 DIAGNOSIS — D7589 Other specified diseases of blood and blood-forming organs: Secondary | ICD-10-CM | POA: Diagnosis not present

## 2023-12-24 DIAGNOSIS — J159 Unspecified bacterial pneumonia: Principal | ICD-10-CM | POA: Diagnosis present

## 2023-12-24 DIAGNOSIS — Z801 Family history of malignant neoplasm of trachea, bronchus and lung: Secondary | ICD-10-CM | POA: Diagnosis not present

## 2023-12-24 DIAGNOSIS — R0902 Hypoxemia: Secondary | ICD-10-CM | POA: Diagnosis not present

## 2023-12-24 DIAGNOSIS — J8410 Pulmonary fibrosis, unspecified: Secondary | ICD-10-CM | POA: Diagnosis present

## 2023-12-24 DIAGNOSIS — J9601 Acute respiratory failure with hypoxia: Secondary | ICD-10-CM | POA: Diagnosis not present

## 2023-12-24 DIAGNOSIS — J439 Emphysema, unspecified: Secondary | ICD-10-CM | POA: Diagnosis not present

## 2023-12-24 DIAGNOSIS — Z79899 Other long term (current) drug therapy: Secondary | ICD-10-CM

## 2023-12-24 DIAGNOSIS — K219 Gastro-esophageal reflux disease without esophagitis: Secondary | ICD-10-CM | POA: Diagnosis present

## 2023-12-24 DIAGNOSIS — I959 Hypotension, unspecified: Secondary | ICD-10-CM | POA: Diagnosis present

## 2023-12-24 DIAGNOSIS — R0602 Shortness of breath: Secondary | ICD-10-CM | POA: Diagnosis not present

## 2023-12-24 DIAGNOSIS — Z419 Encounter for procedure for purposes other than remedying health state, unspecified: Secondary | ICD-10-CM | POA: Diagnosis not present

## 2023-12-24 DIAGNOSIS — Z923 Personal history of irradiation: Secondary | ICD-10-CM | POA: Diagnosis not present

## 2023-12-24 DIAGNOSIS — J189 Pneumonia, unspecified organism: Secondary | ICD-10-CM | POA: Diagnosis not present

## 2023-12-24 DIAGNOSIS — R0600 Dyspnea, unspecified: Secondary | ICD-10-CM

## 2023-12-24 DIAGNOSIS — Z743 Need for continuous supervision: Secondary | ICD-10-CM | POA: Diagnosis not present

## 2023-12-24 DIAGNOSIS — R069 Unspecified abnormalities of breathing: Secondary | ICD-10-CM | POA: Diagnosis not present

## 2023-12-24 DIAGNOSIS — F17210 Nicotine dependence, cigarettes, uncomplicated: Secondary | ICD-10-CM | POA: Diagnosis present

## 2023-12-24 DIAGNOSIS — E876 Hypokalemia: Secondary | ICD-10-CM | POA: Diagnosis present

## 2023-12-24 DIAGNOSIS — R911 Solitary pulmonary nodule: Secondary | ICD-10-CM | POA: Diagnosis not present

## 2023-12-24 DIAGNOSIS — J168 Pneumonia due to other specified infectious organisms: Secondary | ICD-10-CM | POA: Diagnosis not present

## 2023-12-24 DIAGNOSIS — R918 Other nonspecific abnormal finding of lung field: Secondary | ICD-10-CM | POA: Diagnosis not present

## 2023-12-24 DIAGNOSIS — F419 Anxiety disorder, unspecified: Secondary | ICD-10-CM | POA: Diagnosis present

## 2023-12-24 DIAGNOSIS — I2510 Atherosclerotic heart disease of native coronary artery without angina pectoris: Secondary | ICD-10-CM | POA: Diagnosis present

## 2023-12-24 DIAGNOSIS — R2689 Other abnormalities of gait and mobility: Secondary | ICD-10-CM | POA: Diagnosis not present

## 2023-12-24 DIAGNOSIS — J9611 Chronic respiratory failure with hypoxia: Secondary | ICD-10-CM | POA: Diagnosis present

## 2023-12-24 DIAGNOSIS — J441 Chronic obstructive pulmonary disease with (acute) exacerbation: Secondary | ICD-10-CM | POA: Diagnosis not present

## 2023-12-24 DIAGNOSIS — J984 Other disorders of lung: Secondary | ICD-10-CM | POA: Diagnosis not present

## 2023-12-24 HISTORY — DX: Malignant (primary) neoplasm, unspecified: C80.1

## 2023-12-24 HISTORY — DX: Pulmonary fibrosis, unspecified: J84.10

## 2023-12-24 LAB — BASIC METABOLIC PANEL WITH GFR
Anion gap: 12 (ref 5–15)
BUN: 8 mg/dL (ref 6–20)
CO2: 20 mmol/L — ABNORMAL LOW (ref 22–32)
Calcium: 8.6 mg/dL — ABNORMAL LOW (ref 8.9–10.3)
Chloride: 107 mmol/L (ref 98–111)
Creatinine, Ser: 0.58 mg/dL (ref 0.44–1.00)
GFR, Estimated: >60 mL/min (ref >60)
Glucose, Bld: 83 mg/dL (ref 70–99)
Potassium: 3 mmol/L — ABNORMAL LOW (ref 3.5–5.1)
Sodium: 139 mmol/L (ref 135–145)

## 2023-12-24 LAB — CBC WITH DIFFERENTIAL/PLATELET
Abs Immature Granulocytes: 0.04 10*3/uL (ref 0.00–0.07)
Basophils Absolute: 0.1 10*3/uL (ref 0.0–0.1)
Basophils Relative: 1 %
Eosinophils Absolute: 0.1 10*3/uL (ref 0.0–0.5)
Eosinophils Relative: 1 %
HCT: 42 % (ref 36.0–46.0)
Hemoglobin: 14.4 g/dL (ref 12.0–15.0)
Immature Granulocytes: 0 %
Lymphocytes Relative: 19 %
Lymphs Abs: 2 10*3/uL (ref 0.7–4.0)
MCH: 37.1 pg — ABNORMAL HIGH (ref 26.0–34.0)
MCHC: 34.3 g/dL (ref 30.0–36.0)
MCV: 108.2 fL — ABNORMAL HIGH (ref 80.0–100.0)
Monocytes Absolute: 0.9 10*3/uL (ref 0.1–1.0)
Monocytes Relative: 9 %
Neutro Abs: 7.4 10*3/uL (ref 1.7–7.7)
Neutrophils Relative %: 70 %
Platelets: 410 10*3/uL — ABNORMAL HIGH (ref 150–400)
RBC: 3.88 MIL/uL (ref 3.87–5.11)
RDW: 13.7 % (ref 11.5–15.5)
WBC: 10.5 10*3/uL (ref 4.0–10.5)
nRBC: 0 % (ref 0.0–0.2)

## 2023-12-24 LAB — TROPONIN I (HIGH SENSITIVITY): Troponin I (High Sensitivity): <2 ng/L (ref <18)

## 2023-12-24 MED ORDER — IOHEXOL 350 MG/ML SOLN
75.0000 mL | Freq: Once | INTRAVENOUS | Status: AC | PRN
  Administered 2023-12-24: 75 mL via INTRAVENOUS

## 2023-12-24 MED ORDER — HYDROMORPHONE HCL 1 MG/ML IJ SOLN: 0.5000 mg | Freq: Four times a day (QID) | INTRAMUSCULAR | Status: DC | PRN

## 2023-12-24 MED ORDER — SODIUM CHLORIDE 0.9 % IV SOLN
500.0000 mg | Freq: Once | INTRAVENOUS | Status: AC
  Administered 2023-12-24: 500 mg via INTRAVENOUS
  Filled 2023-12-24: qty 5

## 2023-12-24 MED ORDER — KETOROLAC TROMETHAMINE 15 MG/ML IJ SOLN
15.0000 mg | Freq: Once | INTRAMUSCULAR | Status: AC
  Administered 2023-12-25: 15 mg via INTRAVENOUS
  Filled 2023-12-24: qty 1

## 2023-12-24 MED ORDER — SODIUM CHLORIDE 0.9 % IV SOLN
1.0000 g | Freq: Once | INTRAVENOUS | Status: AC
  Administered 2023-12-24: 1 g via INTRAVENOUS
  Filled 2023-12-24: qty 10

## 2023-12-24 MED ORDER — POTASSIUM CHLORIDE CRYS ER 20 MEQ PO TBCR
40.0000 meq | EXTENDED_RELEASE_TABLET | Freq: Once | ORAL | Status: AC
  Administered 2023-12-24: 40 meq via ORAL
  Filled 2023-12-24: qty 2

## 2023-12-24 MED ORDER — IPRATROPIUM-ALBUTEROL 0.5-2.5 (3) MG/3ML IN SOLN
3.0000 mL | Freq: Once | RESPIRATORY_TRACT | Status: AC
  Administered 2023-12-24: 3 mL via RESPIRATORY_TRACT
  Filled 2023-12-24: qty 3

## 2023-12-24 NOTE — ED Triage Notes (Signed)
 Arrives [facility] from home with reports of left sided chest pain that radiates into left side/flank. Endorses SOB, new lung CA dx, hx of pulmonary fibrosis. Room air sats in 70's at arrival. Pt denies drug use but endorses ETOH.

## 2023-12-24 NOTE — ED Provider Notes (Signed)
 [facility] Provider Note    Event Date/Time   First MD Initiated Contact with Patient 12/24/23 2110     (approximate)   History   Chest Pain   HPI  LENITA PEREGRINA is a 58 y.o. female who presents to the emergency department today because concerns for shortness of breath and chest pain.  Symptoms started today.  They did quickly get worse.  The pain is located primarily in the left side of her chest.  It is worse with deep breaths.  Per report the patient was satting in the 70s upon EMS arrival.  Patient states that she is not normally on oxygen.  Does have a history of pulmonary fibrosis and more recent diagnosis of lung cancer.  Patient denies any fevers.     Physical Exam   Triage Vital Signs: ED Triage Vitals  Encounter Vitals Group     BP 12/24/23 2106 100/65     Girls Systolic BP Percentile --      Girls Diastolic BP Percentile --      Boys Systolic BP Percentile --      Boys Diastolic BP Percentile --      Pulse Rate 12/24/23 2106 (!) 103     Resp 12/24/23 2106 (!) 33     Temp 12/24/23 2106 98.7 F (37.1 C)     Temp Source 12/24/23 2106 Oral     SpO2 12/24/23 2106 95 %     Weight 12/24/23 2107 115 lb (52.2 kg)     Height 12/24/23 2107 5' (1.524 m)     Head Circumference --      Peak Flow --      Pain Score 12/24/23 2107 6     Pain Loc --      Pain Education --      Exclude from Growth Chart --     Most recent vital signs: Vitals:   12/24/23 2106  BP: 100/65  Pulse: (!) 103  Resp: (!) 33  Temp: 98.7 F (37.1 C)  SpO2: 95%   General: Awake, alert, oriented. CV:  Good peripheral perfusion. Tachycardia. Resp:  Increased work of breathing. Some rhonchi noted in lower lungs. Abd:  No distention.  Other:  No lower extremity edema.    ED Results / Procedures / Treatments   Labs (all labs ordered are listed, but only abnormal results are displayed) Labs Reviewed  CBC WITH DIFFERENTIAL/PLATELET - Abnormal; Notable for the  following components:      Result Value   MCV 108.2 (*)    MCH 37.1 (*)    Platelets 410 (*)    All other components within normal limits  BASIC METABOLIC PANEL WITH GFR - Abnormal; Notable for the following components:   Potassium 3.0 (*)    CO2 20 (*)    Calcium 8.6 (*)    All other components within normal limits  CULTURE, BLOOD (ROUTINE X 2)  CULTURE, BLOOD (ROUTINE X 2)  TROPONIN I (HIGH SENSITIVITY)  TROPONIN I (HIGH SENSITIVITY)     EKG  I, Guadalupe Eagles, attending physician, personally viewed and interpreted this EKG  EKG Time: 2108 Rate: 105 Rhythm: sinus tachycardia Axis: normal Intervals: qtc 455 QRS: narrow ST changes: no st elevation Impression: abnormal ekg   RADIOLOGY I independently interpreted and visualized the CXR. My interpretation: bilateral airspace disease Radiology interpretation: IMPRESSION:  1. Interstitial coarsening and patchy basilar airspace opacities  bilaterally concerning for infection.  2. Subpleural irregular opacity along the right  upper lung  corresponds to the radiotracer avid nodule seen on PET/CT  07/31/2023.     PROCEDURES:  Critical Care performed: Yes  CRITICAL CARE Performed by: Guadalupe Eagles   Total critical care time: 30 minutes  Critical care time was exclusive of separately billable procedures and treating other patients.  Critical care was necessary to treat or prevent imminent or life-threatening deterioration.  Critical care was time spent personally by me on the following activities: development of treatment plan with patient and/or surrogate as well as nursing, discussions with consultants, evaluation of patient's response to treatment, examination of patient, obtaining history from patient or surrogate, ordering and performing treatments and interventions, ordering and review of laboratory studies, ordering and review of radiographic studies, pulse oximetry and re-evaluation of patient's condition.    Procedures    MEDICATIONS ORDERED IN ED: Medications - No data to display   IMPRESSION / MDM / ASSESSMENT AND PLAN / ED COURSE  I reviewed the triage vital signs and the nursing notes.                              Differential diagnosis includes, but is not limited to, PE, pneumonia, pneumothorax, pleurisy, viral illness  Patient's presentation is most consistent with acute presentation with potential threat to life or bodily function.   The patient is on the cardiac monitor to evaluate for evidence of arrhythmia and/or significant heart rate changes.  Patient presented to the emergency department today because of concerns for chest pain and shortness of breath.  On exam patient does have increased work of breathing.  Some rhonchi noted.  Patient was slightly tachycardic.  Was on 2 L nasal cannula with good oxygen saturation.  Chest x-ray was concerning for possible pneumonia.  Given history of cancer and did have concern for PE so CT angio was obtained.  Fortunately this did not show PE but confirms pneumonia.  Patient was started on antibiotics.  Discussed with Dr. Lou with the hospitalist service who will evaluate for admission.    FINAL CLINICAL IMPRESSION(S) / ED DIAGNOSES   Final diagnoses:  Pneumonia due to infectious organism, unspecified laterality, unspecified part of lung  Hypoxia  Shortness of breath        Rx / DC Orders   ED Discharge Orders     None        Note:  This document was prepared using Dragon voice recognition software and may include unintentional dictation errors.    Eagles Guadalupe, MD 12/24/23 [phone]

## 2023-12-24 NOTE — ED Notes (Signed)
 Ambulated pt to toilet in the room and back to the bed. Call bell within reach and pt is plugged back up to the monitor.

## 2023-12-24 NOTE — H&P (Signed)
 History and Physical  Joy Ball FMW:[id number] DOB: March 25, 1966 DOA: 12/24/2023  PCP: Edman Marsa PARAS, DO   Chief Complaint: Shortness of breath, left-sided chest pain  HPI: Joy Ball is a 58 y.o. female with medical history significant for COPD, pulmonary fibrosis, GERD, and adenocarcinoma of the right lung s/p radiation therapy and chronic hypokalemia who presented to the ED for evaluation of left-sided chest pain and shortness of breath. Patient reports that earlier this evening she started having left-sided chest pain that was worse with deep breathing and cough. She also had associated shortness of breath so they called EMS. On EMS arrival, patient was found to have SpO2 in the 70s, improved to the low 90s on 2 L [place].  Patient reports persistent dry cough but denies any fevers, chills, nausea, vomiting, abdominal pain, palpitations, dysuria, wheezing or headache.  ED Course: Initial vitals show temp 98.7, RR 33, HR 103, BP 100/65, SpO2 95% on 2 L [place]. Initial labs significant for K+ 3.0, normal kidney function, WBC 10.5, Hgb 14.4, platelet 410, normal troponin,. EKG shows sinus tach. CXR shows basilar as base opacities bilaterally concerning for infection.  CTA chest PE study negative for PE but shows evidence of infiltrate in right middle lobe and bilateral lower lobes concerning for atypical or viral pneumonia, unchanged pulmonary nodule.  Pt received KCl 40 mEq x 1, DuoNeb x 1, IV Rocephin  and azithromycin . [facility] was consulted for admission.   Review of Systems: Please see HPI for pertinent positives and negatives. A complete 10 system review of systems are otherwise negative.  Past Medical History:  Diagnosis Date   Asthma    Cancer (HCC)    COPD (chronic obstructive pulmonary disease) (HCC)    Dyspnea    Emphysema lung (HCC)    GERD (gastroesophageal reflux disease)    Lung nodule    Migraines    Pulmonary fibrosis (HCC)    Seasonal allergies    Past Surgical History:   Procedure Laterality Date   CESAREAN SECTION     EYE SURGERY Bilateral 01/2023   TUBAL LIGATION     Social History:  reports that she has been smoking cigarettes. She started smoking about 40 years ago. She has a 121.3 pack-year smoking history. She uses smokeless tobacco. She reports current alcohol use. She reports that she does not use drugs.  Allergies  Allergen Reactions   Amoxicillin Hives    Family History  Problem Relation Age of Onset   Lung cancer Father        was a smoker   Heart disease Maternal Grandfather    Emphysema Mother      Prior to Admission medications   Medication Sig Start Date End Date Taking? Authorizing Provider  acetaminophen  (TYLENOL ) 500 MG tablet Take 1,500 mg by mouth 3 (three) times daily. 2 to 3 times a day.    [provider]  albuterol  (VENTOLIN  HFA) 108 (90 Base) MCG/ACT inhaler INHALE 2 PUFFS INTO THE LUNGS EVERY 6 HOURS AS NEEDED FOR WHEEZING OR SHORTNESS OF BREATH 12/02/23   Karamalegos, Marsa PARAS, DO  baclofen  (LIORESAL ) 10 MG tablet TAKE 1/2 TO 1 TABLET(5 TO 10 MG) BY MOUTH THREE TIMES DAILY AS NEEDED FOR MUSCLE SPASMS Patient not taking: Reported on 11/27/2023 06/11/21   Edman Marsa PARAS, DO  benzonatate  (TESSALON ) 200 MG capsule TAKE 1 CAPSULE(200 MG) BY MOUTH THREE TIMES DAILY AS NEEDED FOR COUGH 11/18/23   Karamalegos, Marsa PARAS, DO  budesonide -formoterol  (SYMBICORT ) 160-4.5 MCG/ACT inhaler Inhale 2  puffs into the lungs daily. 08/26/23   Isadora Hose, MD  buPROPion  (WELLBUTRIN  SR) 150 MG 12 hr tablet TAKE 1 TABLET(150 MG) BY MOUTH TWICE DAILY 09/04/23   Edman, Marsa PARAS, DO  fluticasone  (FLONASE ) 50 MCG/ACT nasal spray Place 2 sprays into both nostrils daily. 03/30/22   Gladis Elsie BROCKS, PA-C  gabapentin  (NEURONTIN ) 300 MG capsule TAKE 1 CAPSULE(300 MG) BY MOUTH AT BEDTIME 11/12/23   Karamalegos, Alexander J, DO  ipratropium (ATROVENT ) 0.03 % nasal spray Place 2 sprays into both nostrils every 12 (twelve) hours.  07/16/23   Moishe Chiquita HERO, NP  loratadine  (CLARITIN ) 10 MG tablet Take 10 mg by mouth daily.    [provider]  moxifloxacin (VIGAMOX) 0.5 % ophthalmic solution Place 1 drop into the right eye 4 (four) times daily.    [provider]  naproxen  (NAPROSYN ) 500 MG tablet TAKE 1 TABLET(500 MG) BY MOUTH TWICE DAILY WITH A MEAL 09/04/23   Karamalegos, Marsa PARAS, DO  nicotine  (NICODERM CQ ) 14 mg/24hr patch Place 1 patch (14 mg total) onto the skin daily. 02/03/23   Karamalegos, Marsa PARAS, DO  nystatin  (MYCOSTATIN ) 100000 UNIT/ML suspension SHAKE LIQUID AND TAKE 5 ML BY MOUTH FOUR TIMES DAILY 10/27/23   Edman, Marsa PARAS, DO  omeprazole (PRILOSEC) 20 MG capsule Take 20 mg by mouth daily. 06/27/14   [provider]  potassium chloride  SA (KLOR-CON  M) 20 MEQ tablet Take 1 tablet (20 mEq total) by mouth daily. 12/01/22   Karamalegos, Marsa PARAS, DO  potassium chloride  SA (KLOR-CON  M) 20 MEQ tablet Take 1 tablet (20 mEq total) by mouth 2 (two) times daily. 08/19/23   Babara Call, MD  prednisoLONE acetate (PRED FORTE) 1 % ophthalmic suspension Place 1 drop into the right eye 4 (four) times daily. 07/05/23   [provider]  Tiotropium Bromide  Monohydrate (SPIRIVA  RESPIMAT) 2.5 MCG/ACT AERS Inhale 2 puffs into the lungs daily. 08/26/23   Isadora Hose, MD    Physical Exam: BP 100/65   Pulse (!) 103   Temp 98.7 F (37.1 C) (Oral)   Resp (!) 33   Ht 5' (1.524 m)   Wt 52.2 kg   SpO2 95%   BMI 22.46 kg/m  General: Pleasant, acutely ill middle-age woman sitting in bed. Appears older than stated age.  No acute distress. HEENT: [place]/AT. Anicteric sclera CV: Tachycardic.  Regular rhythm. No murmurs, rubs, or gallops. No LE edema Pulmonary: On 2 L [place]. Tachypneic. Lungs CTAB. Normal effort. Fine rales in the right middle and lower lobes. No wheezing. Decreased air movement at the bases. Abdominal: Soft, nontender, nondistended. Normal bowel sounds. Extremities: Palpable radial  and DP pulses. Normal ROM. Skin: Warm and dry. No obvious rash or lesions. Neuro: A&Ox3. Moves all extremities. Normal sensation to light touch. No focal deficit. Psych: Normal mood and affect          Labs on Admission:  Basic Metabolic Panel: Recent Labs  Lab 12/24/23 2147  NA 139  K 3.0*  CL 107  CO2 20*  GLUCOSE 83  BUN 8  CREATININE 0.58  CALCIUM 8.6*   Liver Function Tests: No results for input(s): AST, ALT, ALKPHOS, BILITOT, PROT, ALBUMIN in the last 168 hours. No results for input(s): LIPASE, AMYLASE in the last 168 hours. No results for input(s): AMMONIA in the last 168 hours. CBC: Recent Labs  Lab 12/24/23 2147  WBC 10.5  NEUTROABS 7.4  HGB 14.4  HCT 42.0  MCV 108.2*  PLT 410*   Cardiac  Enzymes: No results for input(s): CKTOTAL, CKMB, CKMBINDEX, TROPONINI in the last 168 hours. BNP (last 3 results) No results for input(s): BNP in the last 8760 hours.  ProBNP (last 3 results) No results for input(s): PROBNP in the last 8760 hours.  CBG: No results for input(s): GLUCAP in the last 168 hours.  Radiological Exams on Admission: CT Angio Chest PE W and/or Wo Contrast Result Date: 12/24/2023 CLINICAL DATA:  Left-sided chest pain. EXAM: CT ANGIOGRAPHY CHEST WITH CONTRAST TECHNIQUE: Multidetector CT imaging of the chest was performed using the standard protocol during bolus administration of intravenous contrast. Multiplanar CT image reconstructions and MIPs were obtained to evaluate the vascular anatomy. RADIATION DOSE REDUCTION: This exam was performed according to the departmental dose-optimization program which includes automated exposure control, adjustment of the mA and/or kV according to patient size and/or use of iterative reconstruction technique. CONTRAST:  75mL OMNIPAQUE  IOHEXOL  350 MG/ML SOLN COMPARISON:  July 31, 2023 FINDINGS: Cardiovascular: The thoracic aorta is normal in appearance. Satisfactory opacification of the  pulmonary arteries to the segmental level. No evidence of pulmonary embolism. Normal heart size. No pericardial effusion. Mediastinum/Nodes: There is mild AP window and mild pretracheal lymphadenopathy. Thyroid  gland, trachea, and esophagus demonstrate no significant findings. Lungs/Pleura: There is evidence of extensive emphysematous lung disease with marked severity, thick band-like areas of biapical scarring. This extends along the posterior aspects of the bilateral upper lobes, right greater than left. The 1.9 cm x 1.5 cm posterolateral right upper lobe pulmonary nodule seen on the prior study has less distinct borders on the current exam and measures approximately 1.7 cm x 1.8 cm (axial CT images 44-53, CT series 5). Mild posteromedial right upper lobe atelectasis and/or infiltrate is present (axial CT images 33 through 48, CT series 5). Mild to moderate severity fibrotic changes are seen along the periphery of the bilateral lung bases. Moderate severity areas of ground-glass appearing lung parenchymal are seen throughout the right middle lobe and bilateral lower lobes. No pleural effusion or pneumothorax is identified. Upper Abdomen: There is a small hiatal hernia. Musculoskeletal: No chest wall abnormality. No acute or significant osseous findings. Review of the MIP images confirms the above findings. IMPRESSION: 1. No evidence of pulmonary embolism. 2. Extensive emphysematous lung disease with marked severity, thick band-like areas of biapical scarring. 3. The posterolateral pulmonary nodule seen on the prior study has less distinct borders on the current exam. This was evaluated by nuclear medicine PET/CT (August 12, 2023) and remains suspicious for an underlying neoplasm. 4. Moderate severity areas of ground-glass appearing lung parenchymal throughout the right middle lobe and bilateral lower lobes, which may represent sequelae associated with an atypical or viral pneumonia. 5. Mild posteromedial right  upper lobe atelectasis and/or infiltrate. 6. Small hiatal hernia. Electronically Signed   By: Suzen Dials M.D.   On: 12/24/2023 22:58   DG Chest Portable 1 View Result Date: 12/24/2023 CLINICAL DATA:  Shortness of breath and left-sided chest pain EXAM: PORTABLE CHEST 1 VIEW COMPARISON:  08/11/2023 FINDINGS: Interstitial coarsening and patchy basilar airspace opacities bilaterally. Additional subpleural irregular opacity along the right upper lung corresponds to the radiotracer avid nodule seen on PET/CT 07/31/2023. No pleural effusion or pneumothorax. No displaced rib fractures. Stable cardiomediastinal silhouette. IMPRESSION: 1. Interstitial coarsening and patchy basilar airspace opacities bilaterally concerning for infection. 2. Subpleural irregular opacity along the right upper lung corresponds to the radiotracer avid nodule seen on PET/CT 07/31/2023. Electronically Signed   By: Norman Gatlin M.D.   On: 12/24/2023  21:44   Assessment/Plan Joy Ball is a 58 y.o. female with medical history significant for COPD, pulmonary fibrosis, GERD, and adenocarcinoma of the right lung s/p radiation therapy and chronic hypokalemia who presented to the ED for evaluation of left-sided chest pain and shortness of breath and admitted for acute hypoxic respiratory failure and community-acquired pneumonia.  # Acute hypoxic respiratory failure - Presented with left-sided chest pain and SOB - Found to have new O2 requirement of 2 L [place] - This is secondary to community-acquired pneumonia - Continue supplemental O2, wean as able  # Community-acquired pneumonia - Pt presented with left-sided chest pain, SOB and cough - Chest imaging shows opacities in the right middle lobe and bilateral lower lobes - Continue IV Rocephin  and azithromycin  - Mucinex  DM twice daily - Follow-up blood culture and sputum culture - Check MRSA screen, full RVP, procalcitonin, urinary Legionella and strep pneumo - Trend CBC, fever  curve - Incentive spirometer, flutter valve - Supplemental O2 as needed  # Left-sided chest pain - Reports a pleuritic chest pain that started earlier today - CTA chest PE study negative for PE, normal EKG and troponin rules out ACS - Likely secondary to pneumonia - Apply lidocaine  patch to left chest wall - IV Toradol  as needed for pain  # Hypokalemia - History of hypokalemia on potassium supplementation but reports she has stopped taking it - K+ low at 3.0 on admission - Give oral KCl 40 mEq x 1, repeat as needed - F/u morning potassium and mag  # COPD - No evidence of wheezing on lung auscultation - Continue home bronchodilator - As needed duonebs - Supplemental O2 as needed  # Pulmonary fibrosis - Not on any antifibrotics, recently referred to the [facility] clinic for further evaluation - Has fine crackles in the right middle lobe and lower lobe - Continue bronchodilators  # Primary adenocarcinoma of the right lung, stage IA - Contraindication for surgery due to lung scarring and fibrosis - Status post 5 sessions of radiation therapy - Follow-up with radiation oncology  # Allergic rhinitis - Continue as needed Flonase  and nasal spray - Continue loratadine   # Tobacco use disorder, relapsed - Previously smoked up to 2-3 packs/day but stopped smoking in September 2024. - Reports she just resumed smoking a few weeks ago but down to 1 pack/day - Nicotine  patch offered however patient declined  # GERD - Continue Protonix   # Anxiety - Continue bupropion   DVT prophylaxis: Lovenox      Code Status: Full Code  Consults called: None  Family Communication: Discussed admission with boyfriend at bedside  Severity of Illness: The appropriate patient status for this patient is INPATIENT. Inpatient status is judged to be reasonable and necessary in order to provide the required intensity of service to ensure the patient's safety. The patient's presenting symptoms, physical exam  findings, and initial radiographic and laboratory data in the context of their chronic comorbidities is felt to place them at high risk for further clinical deterioration. Furthermore, it is not anticipated that the patient will be medically stable for discharge from the hospital within 2 midnights of admission.   * I certify that at the point of admission it is my clinical judgment that the patient will require inpatient hospital care spanning beyond 2 midnights from the point of admission due to high intensity of service, high risk for further deterioration and high frequency of surveillance required.*  Level of care: Telemetry Medical   This record has been created using  [employer]. Errors have been sought and corrected, but may not always be located. Such creation errors do not reflect on the standard of care.   Lou Claretta HERO, MD 12/24/2023, 11:33 PM [facility] Pager: [phone] Isaiah 41:10   If 7PM-7AM, please contact night-coverage [email or web address] Password [email or web address]

## 2023-12-25 DIAGNOSIS — J9601 Acute respiratory failure with hypoxia: Secondary | ICD-10-CM

## 2023-12-25 DIAGNOSIS — J189 Pneumonia, unspecified organism: Secondary | ICD-10-CM

## 2023-12-25 DIAGNOSIS — R0602 Shortness of breath: Secondary | ICD-10-CM

## 2023-12-25 DIAGNOSIS — R0600 Dyspnea, unspecified: Secondary | ICD-10-CM

## 2023-12-25 LAB — CBC
HCT: 42.1 % (ref 36.0–46.0)
Hemoglobin: 13.8 g/dL (ref 12.0–15.0)
MCH: 37.1 pg — ABNORMAL HIGH (ref 26.0–34.0)
MCHC: 32.8 g/dL (ref 30.0–36.0)
MCV: 113.2 fL — ABNORMAL HIGH (ref 80.0–100.0)
Platelets: 350 K/uL (ref 150–400)
RBC: 3.72 MIL/uL — ABNORMAL LOW (ref 3.87–5.11)
RDW: 13.9 % (ref 11.5–15.5)
WBC: 11 K/uL — ABNORMAL HIGH (ref 4.0–10.5)
nRBC: 0 % (ref 0.0–0.2)

## 2023-12-25 LAB — RESPIRATORY PANEL BY PCR

## 2023-12-25 LAB — COMPREHENSIVE METABOLIC PANEL WITH GFR
ALT: 19 U/L (ref 0–44)
AST: 31 U/L (ref 15–41)
Albumin: 2.4 g/dL — ABNORMAL LOW (ref 3.5–5.0)
Alkaline Phosphatase: 157 U/L — ABNORMAL HIGH (ref 38–126)
Anion gap: 9 (ref 5–15)
BUN: 11 mg/dL (ref 6–20)
CO2: 22 mmol/L (ref 22–32)
Calcium: 8 mg/dL — ABNORMAL LOW (ref 8.9–10.3)
Chloride: 110 mmol/L (ref 98–111)
Creatinine, Ser: 0.6 mg/dL (ref 0.44–1.00)
GFR, Estimated: >60 mL/min (ref >60)
Glucose, Bld: 91 mg/dL (ref 70–99)
Potassium: 4.1 mmol/L (ref 3.5–5.1)
Sodium: 141 mmol/L (ref 135–145)
Total Bilirubin: 0.5 mg/dL (ref 0.0–1.2)
Total Protein: 5 g/dL — ABNORMAL LOW (ref 6.5–8.1)

## 2023-12-25 LAB — TROPONIN I (HIGH SENSITIVITY): Troponin I (High Sensitivity): 3 ng/L (ref <18)

## 2023-12-25 LAB — VITAMIN B12: Vitamin B-12: 322 pg/mL (ref 180–914)

## 2023-12-25 LAB — PROCALCITONIN: Procalcitonin: 0.16 ng/mL

## 2023-12-25 LAB — MRSA NEXT GEN BY PCR, NASAL: MRSA by PCR Next Gen: NOT DETECTED

## 2023-12-25 LAB — HIV ANTIBODY (ROUTINE TESTING W REFLEX): HIV Screen 4th Generation wRfx: NONREACTIVE

## 2023-12-25 LAB — MAGNESIUM: Magnesium: 2 mg/dL (ref 1.7–2.4)

## 2023-12-25 LAB — STREP PNEUMONIAE URINARY ANTIGEN: Strep Pneumo Urinary Antigen: NEGATIVE

## 2023-12-25 LAB — FOLATE: Folate: 5.7 ng/mL — ABNORMAL LOW (ref >5.9)

## 2023-12-25 MED ORDER — DM-GUAIFENESIN ER 30-600 MG PO TB12
1.0000 | ORAL_TABLET | Freq: Two times a day (BID) | ORAL | Status: DC
  Administered 2023-12-25 – 2023-12-31 (×14): 1 via ORAL
  Filled 2023-12-25 (×15): qty 1

## 2023-12-25 MED ORDER — LIDOCAINE 5 % EX PTCH
1.0000 | MEDICATED_PATCH | CUTANEOUS | Status: DC
  Administered 2023-12-25 – 2023-12-27 (×3): 1 via TRANSDERMAL
  Filled 2023-12-25 (×3): qty 1

## 2023-12-25 MED ORDER — ACETAMINOPHEN 325 MG PO TABS
650.0000 mg | ORAL_TABLET | Freq: Four times a day (QID) | ORAL | Status: DC | PRN
  Administered 2023-12-25 – 2023-12-31 (×5): 650 mg via ORAL
  Filled 2023-12-25 (×5): qty 2

## 2023-12-25 MED ORDER — PANTOPRAZOLE SODIUM 40 MG PO TBEC
40.0000 mg | DELAYED_RELEASE_TABLET | Freq: Every day | ORAL | Status: DC
  Administered 2023-12-25 – 2024-01-04 (×11): 40 mg via ORAL
  Filled 2023-12-25 (×11): qty 1

## 2023-12-25 MED ORDER — ONDANSETRON HCL 4 MG/2ML IJ SOLN
4.0000 mg | Freq: Four times a day (QID) | INTRAMUSCULAR | Status: DC | PRN
  Administered 2023-12-25 – 2024-01-02 (×3): 4 mg via INTRAVENOUS
  Filled 2023-12-25 (×3): qty 2

## 2023-12-25 MED ORDER — NAPROXEN 500 MG PO TABS
500.0000 mg | ORAL_TABLET | Freq: Two times a day (BID) | ORAL | Status: DC
  Administered 2023-12-25 – 2024-01-04 (×21): 500 mg via ORAL
  Filled 2023-12-25 (×22): qty 1

## 2023-12-25 MED ORDER — GABAPENTIN 300 MG PO CAPS
300.0000 mg | ORAL_CAPSULE | Freq: Every day | ORAL | Status: DC
  Administered 2023-12-25 – 2024-01-03 (×11): 300 mg via ORAL
  Filled 2023-12-25 (×11): qty 1

## 2023-12-25 MED ORDER — TIOTROPIUM BROMIDE MONOHYDRATE 2.5 MCG/ACT IN AERS: 2.0000 | INHALATION_SPRAY | Freq: Every day | RESPIRATORY_TRACT | Status: DC

## 2023-12-25 MED ORDER — BUDESON-GLYCOPYRROL-FORMOTEROL 160-9-4.8 MCG/ACT IN AERO
2.0000 | INHALATION_SPRAY | Freq: Two times a day (BID) | RESPIRATORY_TRACT | Status: DC
  Administered 2023-12-25 – 2023-12-27 (×6): 2 via RESPIRATORY_TRACT
  Filled 2023-12-25: qty 5.9

## 2023-12-25 MED ORDER — SODIUM CHLORIDE 0.9 % IV SOLN
1.0000 g | INTRAVENOUS | Status: DC
  Administered 2023-12-25 – 2023-12-27 (×3): 1 g via INTRAVENOUS
  Filled 2023-12-25 (×3): qty 10

## 2023-12-25 MED ORDER — ENOXAPARIN SODIUM 40 MG/0.4ML IJ SOSY
40.0000 mg | PREFILLED_SYRINGE | INTRAMUSCULAR | Status: DC
  Administered 2023-12-25 – 2024-01-04 (×11): 40 mg via SUBCUTANEOUS
  Filled 2023-12-25 (×11): qty 0.4

## 2023-12-25 MED ORDER — LORATADINE 10 MG PO TABS
10.0000 mg | ORAL_TABLET | Freq: Every day | ORAL | Status: DC
  Administered 2023-12-25 – 2024-01-04 (×11): 10 mg via ORAL
  Filled 2023-12-25 (×12): qty 1

## 2023-12-25 MED ORDER — ACETAMINOPHEN 650 MG RE SUPP
650.0000 mg | Freq: Four times a day (QID) | RECTAL | Status: DC | PRN
Start: 2023-12-25 — End: 2024-01-04

## 2023-12-25 MED ORDER — KETOROLAC TROMETHAMINE 15 MG/ML IJ SOLN
15.0000 mg | Freq: Three times a day (TID) | INTRAMUSCULAR | Status: AC | PRN
  Administered 2023-12-25 – 2023-12-27 (×5): 15 mg via INTRAVENOUS
  Filled 2023-12-25 (×5): qty 1

## 2023-12-25 MED ORDER — SENNOSIDES-DOCUSATE SODIUM 8.6-50 MG PO TABS: 1.0000 | ORAL_TABLET | Freq: Every evening | ORAL | Status: DC | PRN

## 2023-12-25 MED ORDER — FLUTICASONE PROPIONATE 50 MCG/ACT NA SUSP
2.0000 | Freq: Every day | NASAL | Status: DC | PRN
  Administered 2024-01-03: 2 via NASAL
  Filled 2023-12-25: qty 16

## 2023-12-25 MED ORDER — BUPROPION HCL ER (SR) 150 MG PO TB12
150.0000 mg | ORAL_TABLET | Freq: Two times a day (BID) | ORAL | Status: DC
  Administered 2023-12-25 – 2024-01-04 (×21): 150 mg via ORAL
  Filled 2023-12-25 (×22): qty 1

## 2023-12-25 MED ORDER — ONDANSETRON HCL 4 MG PO TABS
4.0000 mg | ORAL_TABLET | Freq: Four times a day (QID) | ORAL | Status: DC | PRN
Start: 2023-12-25 — End: 2024-01-04

## 2023-12-25 MED ORDER — IPRATROPIUM BROMIDE 0.03 % NA SOLN: 2.0000 | Freq: Two times a day (BID) | NASAL | Status: DC | PRN

## 2023-12-25 MED ORDER — FLUTICASONE FUROATE-VILANTEROL 200-25 MCG/ACT IN AEPB: 1.0000 | INHALATION_SPRAY | Freq: Every day | RESPIRATORY_TRACT | Status: DC

## 2023-12-25 MED ORDER — SODIUM CHLORIDE 0.9 % IV SOLN
500.0000 mg | INTRAVENOUS | Status: DC
  Administered 2023-12-25 – 2023-12-27 (×3): 500 mg via INTRAVENOUS
  Filled 2023-12-25 (×3): qty 5

## 2023-12-25 NOTE — Progress Notes (Addendum)
 Progress Note    Joy Ball  FMW:[id number] DOB: 09-20-1965  DOA: 12/24/2023 PCP: Edman Marsa PARAS, DO      Brief Narrative:    Medical records reviewed and are as summarized below:  Joy Ball is a 58 y.o. female  with medical history significant for COPD, pulmonary fibrosis, GERD, and adenocarcinoma of the right lung s/p radiation therapy and chronic hypokalemia who presented to the ED for evaluation of cough, left-sided chest pain and shortness of breath.  When EMS arrived, she was hypoxic with oxygen saturation in the 70s.      Assessment/Plan:   Principal Problem:   Acute respiratory failure with hypoxia (HCC) Active Problems:   Shortness of breath   Pneumonia due to infectious organism    Body mass index is 22.46 kg/m.   Community-acquired pneumonia with left-sided pleuritic chest pain, cough and shortness of breath: Continue IV ceftriaxone  and azithromycin .  Follow-up blood cultures.  Respiratory panel was negative.  MRSA screen by PCR was negative.   Acute hypoxic respiratory failure: She is requiring 2.5 L/min oxygen via [place].  Off oxygen as able.  She was not on supplemental oxygen at home.  Suspect she may need home oxygen   Hypotension: BP is better.  Continue to monitor.  Hypokalemia: Improved.  Patient reports history of recurrent hypokalemia.   COPD (extensive emphysema on CT chest), pulmonary fibrosis: Continue bronchodilators.  Outpatient follow-up with interstitial lung disease clinic for further management.   Primary adenocarcinoma of the right lung, stage Ia, s/p radiation therapy (completed in April 2025).  Outpatient follow-up with radiation oncologist.   Macrocytosis: Check vitamin B12 and folate levels   Comorbidities include CAD, anxiety, tobacco use disorder   Diet Order             Diet regular Room service appropriate? Yes; Fluid consistency: Thin  Diet effective now                             Consultants: None  Procedures: None    Medications:    budesonide -glycopyrrolate -formoterol   2 puff Inhalation BID   buPROPion   150 mg Oral BID   dextromethorphan -guaiFENesin   1 tablet Oral BID   enoxaparin  (LOVENOX ) injection  40 mg Subcutaneous Q24H   gabapentin   300 mg Oral QHS   lidocaine   1 patch Transdermal Q24H   loratadine   10 mg Oral Daily   naproxen   500 mg Oral BID WC   pantoprazole   40 mg Oral Daily   Continuous Infusions:  azithromycin      cefTRIAXone  (ROCEPHIN )  IV       Anti-infectives (From admission, onward)    Start     Dose/Rate Route Frequency Ordered Stop   12/25/23 2300  cefTRIAXone  (ROCEPHIN ) 1 g in sodium chloride  0.9 % 100 mL IVPB        1 g 200 mL/hr over 30 Minutes Intravenous Every 24 hours 12/25/23 0741     12/25/23 2200  azithromycin  (ZITHROMAX ) 500 mg in sodium chloride  0.9 % 250 mL IVPB        500 mg 250 mL/hr over 60 Minutes Intravenous Every 24 hours 12/25/23 0741     12/24/23 2300  cefTRIAXone  (ROCEPHIN ) 1 g in sodium chloride  0.9 % 100 mL IVPB        1 g 200 mL/hr over 30 Minutes Intravenous  Once 12/24/23 2245 12/24/23 2345   12/24/23 2300  azithromycin  (ZITHROMAX ) 500 mg  in sodium chloride  0.9 % 250 mL IVPB        500 mg 250 mL/hr over 60 Minutes Intravenous  Once 12/24/23 2245 12/25/23 0110              Family Communication/Anticipated D/C date and plan/Code Status   DVT prophylaxis: enoxaparin  (LOVENOX ) injection 40 mg Start: 12/25/23 0800     Code Status: Full Code  Family Communication: None Disposition Plan: Plan to discharge home   Status is: Inpatient Remains inpatient appropriate because: Community-acquired pneumonia with acute hypoxic respiratory failure       Subjective:   Interval events noted.  She complains of cough and left pleuritic chest pain.  Breathing is a little better.  Objective:    Vitals:   12/24/23 2341 12/25/23 0043 12/25/23 0347 12/25/23 0802  BP:   104/73 (!) 81/58 101/74  Pulse: (!) 102 69 97 78  Resp:  18 18 16   Temp:  98.7 F (37.1 C) 98 F (36.7 C) 97.9 F (36.6 C)  TempSrc:    Oral  SpO2: 90% (!) 88% 94% 95%  Weight:  52.2 kg    Height:  5' (1.524 m)     No data found.   Intake/Output Summary (Last 24 hours) at 12/25/2023 1359 Last data filed at 12/25/2023 1047 Gross per 24 hour  Intake 848.71 ml  Output --  Net 848.71 ml   Filed Weights   12/24/23 2107 12/25/23 0043  Weight: 52.2 kg 52.2 kg    Exam:  GEN: NAD SKIN: Warm and dry EYES: No pallor or icterus ENT: MMM CV: RRR PULM: Bibasilar rales, bilateral rhonchi ABD: soft, ND, NT, +BS CNS: AAO x 3, non focal EXT: No edema or tenderness        Data Reviewed:   I have personally reviewed following labs and imaging studies:  Labs: Labs show the following:   Basic Metabolic Panel: Recent Labs  Lab 12/24/23 2147 12/25/23 0659  NA 139 141  K 3.0* 4.1  CL 107 110  CO2 20* 22  GLUCOSE 83 91  BUN 8 11  CREATININE 0.58 0.60  CALCIUM 8.6* 8.0*  MG  --  2.0   GFR Estimated Creatinine Clearance: 56.4 mL/min (by C-G formula based on SCr of 0.6 mg/dL). Liver Function Tests: Recent Labs  Lab 12/25/23 0659  AST 31  ALT 19  ALKPHOS 157*  BILITOT 0.5  PROT 5.0*  ALBUMIN 2.4*   No results for input(s): LIPASE, AMYLASE in the last 168 hours. No results for input(s): AMMONIA in the last 168 hours. Coagulation profile No results for input(s): INR, PROTIME in the last 168 hours.  CBC: Recent Labs  Lab 12/24/23 2147 12/25/23 0506  WBC 10.5 11.0*  NEUTROABS 7.4  --   HGB 14.4 13.8  HCT 42.0 42.1  MCV 108.2* 113.2*  PLT 410* 350   Cardiac Enzymes: No results for input(s): CKTOTAL, CKMB, CKMBINDEX, TROPONINI in the last 168 hours. BNP (last 3 results) No results for input(s): PROBNP in the last 8760 hours. CBG: No results for input(s): GLUCAP in the last 168 hours. D-Dimer: No results for input(s): DDIMER in the  last 72 hours. Hgb A1c: No results for input(s): HGBA1C in the last 72 hours. Lipid Profile: No results for input(s): CHOL, HDL, LDLCALC, TRIG, CHOLHDL, LDLDIRECT in the last 72 hours. Thyroid  function studies: No results for input(s): TSH, T4TOTAL, T3FREE, THYROIDAB in the last 72 hours.  Invalid input(s): FREET3 Anemia work up: No results for input(s): VITAMINB12,  FOLATE, FERRITIN, TIBC, IRON, RETICCTPCT in the last 72 hours. Sepsis Labs: Recent Labs  Lab 12/24/23 2147 12/25/23 0053 12/25/23 0506  PROCALCITON  --  0.16  --   WBC 10.5  --  11.0*    Microbiology Recent Results (from the past 240 hours)  Blood culture (routine x 2)     Status: None (Preliminary result)   Collection Time: 12/24/23 11:00 PM   Specimen: BLOOD  Result Value Ref Range Status   Specimen Description BLOOD BLOOD LEFT ARM  Final   Special Requests   Final    BOTTLES DRAWN AEROBIC AND ANAEROBIC Blood Culture adequate volume   Culture   Final    NO GROWTH < 12 HOURS Performed at [facility], [address] [place], [place] [address]    Report Status PENDING  Incomplete  Blood culture (routine x 2)     Status: None (Preliminary result)   Collection Time: 12/24/23 11:20 PM   Specimen: BLOOD  Result Value Ref Range Status   Specimen Description BLOOD BLOOD RIGHT HAND  Final   Special Requests   Final    BOTTLES DRAWN AEROBIC AND ANAEROBIC Blood Culture adequate volume   Culture   Final    NO GROWTH < 12 HOURS Performed at [facility], [address] [place], [place] [address]    Report Status PENDING  Incomplete  Respiratory (~20 pathogens) panel by PCR     Status: None   Collection Time: 12/25/23  1:09 AM   Specimen: Nasal Mucosa; Respiratory  Result Value Ref Range Status   Adenovirus NOT DETECTED NOT DETECTED Final   Coronavirus 229E NOT DETECTED NOT DETECTED Final    Comment: (NOTE) The Coronavirus on the Respiratory Panel, DOES  NOT test for the novel  Coronavirus (2019 nCoV)    Coronavirus HKU1 NOT DETECTED NOT DETECTED Final   Coronavirus NL63 NOT DETECTED NOT DETECTED Final   Coronavirus OC43 NOT DETECTED NOT DETECTED Final   Metapneumovirus NOT DETECTED NOT DETECTED Final   Rhinovirus / Enterovirus NOT DETECTED NOT DETECTED Final   Influenza A NOT DETECTED NOT DETECTED Final   Influenza B NOT DETECTED NOT DETECTED Final   Parainfluenza Virus 1 NOT DETECTED NOT DETECTED Final   Parainfluenza Virus 2 NOT DETECTED NOT DETECTED Final   Parainfluenza Virus 3 NOT DETECTED NOT DETECTED Final   Parainfluenza Virus 4 NOT DETECTED NOT DETECTED Final   Respiratory Syncytial Virus NOT DETECTED NOT DETECTED Final   Bordetella pertussis NOT DETECTED NOT DETECTED Final   Bordetella Parapertussis NOT DETECTED NOT DETECTED Final   Chlamydophila pneumoniae NOT DETECTED NOT DETECTED Final   Mycoplasma pneumoniae NOT DETECTED NOT DETECTED Final    Comment: Performed at [facility], [address], [place], [place] [address]  MRSA Next Gen by PCR, Nasal     Status: None   Collection Time: 12/25/23  1:09 AM   Specimen: Nasal Mucosa; Nasal Swab  Result Value Ref Range Status   MRSA by PCR Next Gen NOT DETECTED NOT DETECTED Final    Comment: (NOTE) The GeneXpert MRSA Assay (FDA approved for NASAL specimens only), is one component of a comprehensive MRSA colonization surveillance program. It is not intended to diagnose MRSA infection nor to guide or monitor treatment for MRSA infections. Test performance is not FDA approved in patients less than 35 years old. Performed at [facility], [address] [place], [place] [address]     Procedures and diagnostic studies:  CT Angio  Chest PE W and/or Wo Contrast Result Date: 12/24/2023 CLINICAL DATA:  Left-sided chest pain. EXAM: CT ANGIOGRAPHY CHEST WITH CONTRAST TECHNIQUE: Multidetector CT imaging of the chest was performed using the standard protocol during  bolus administration of intravenous contrast. Multiplanar CT image reconstructions and MIPs were obtained to evaluate the vascular anatomy. RADIATION DOSE REDUCTION: This exam was performed according to the departmental dose-optimization program which includes automated exposure control, adjustment of the mA and/or kV according to patient size and/or use of iterative reconstruction technique. CONTRAST:  75mL OMNIPAQUE  IOHEXOL  350 MG/ML SOLN COMPARISON:  July 31, 2023 FINDINGS: Cardiovascular: The thoracic aorta is normal in appearance. Satisfactory opacification of the pulmonary arteries to the segmental level. No evidence of pulmonary embolism. Normal heart size. No pericardial effusion. Mediastinum/Nodes: There is mild AP window and mild pretracheal lymphadenopathy. Thyroid  gland, trachea, and esophagus demonstrate no significant findings. Lungs/Pleura: There is evidence of extensive emphysematous lung disease with marked severity, thick band-like areas of biapical scarring. This extends along the posterior aspects of the bilateral upper lobes, right greater than left. The 1.9 cm x 1.5 cm posterolateral right upper lobe pulmonary nodule seen on the prior study has less distinct borders on the current exam and measures approximately 1.7 cm x 1.8 cm (axial CT images 44-53, CT series 5). Mild posteromedial right upper lobe atelectasis and/or infiltrate is present (axial CT images 33 through 48, CT series 5). Mild to moderate severity fibrotic changes are seen along the periphery of the bilateral lung bases. Moderate severity areas of ground-glass appearing lung parenchymal are seen throughout the right middle lobe and bilateral lower lobes. No pleural effusion or pneumothorax is identified. Upper Abdomen: There is a small hiatal hernia. Musculoskeletal: No chest wall abnormality. No acute or significant osseous findings. Review of the MIP images confirms the above findings. IMPRESSION: 1. No evidence of pulmonary  embolism. 2. Extensive emphysematous lung disease with marked severity, thick band-like areas of biapical scarring. 3. The posterolateral pulmonary nodule seen on the prior study has less distinct borders on the current exam. This was evaluated by nuclear medicine PET/CT (August 12, 2023) and remains suspicious for an underlying neoplasm. 4. Moderate severity areas of ground-glass appearing lung parenchymal throughout the right middle lobe and bilateral lower lobes, which may represent sequelae associated with an atypical or viral pneumonia. 5. Mild posteromedial right upper lobe atelectasis and/or infiltrate. 6. Small hiatal hernia. Electronically Signed   By: Suzen Dials M.D.   On: 12/24/2023 22:58   DG Chest Portable 1 View Result Date: 12/24/2023 CLINICAL DATA:  Shortness of breath and left-sided chest pain EXAM: PORTABLE CHEST 1 VIEW COMPARISON:  08/11/2023 FINDINGS: Interstitial coarsening and patchy basilar airspace opacities bilaterally. Additional subpleural irregular opacity along the right upper lung corresponds to the radiotracer avid nodule seen on PET/CT 07/31/2023. No pleural effusion or pneumothorax. No displaced rib fractures. Stable cardiomediastinal silhouette. IMPRESSION: 1. Interstitial coarsening and patchy basilar airspace opacities bilaterally concerning for infection. 2. Subpleural irregular opacity along the right upper lung corresponds to the radiotracer avid nodule seen on PET/CT 07/31/2023. Electronically Signed   By: Norman Gatlin M.D.   On: 12/24/2023 21:44               LOS: 1 day   Login [place]  [facility]   Pager on [email or web address]. If 7PM-7AM, please contact night-coverage at [email or web address]     12/25/2023, 1:59 PM

## 2023-12-25 NOTE — Progress Notes (Addendum)
 Patient ambulated 1 lap in hallway with RN, stand by assist with O2 via [place] at 3 L. Dyspnea with exertion noted.

## 2023-12-25 NOTE — Plan of Care (Signed)
  Problem: Education: Goal: Knowledge of General Education information will improve Description: Including pain rating scale, medication(s)/side effects and non-pharmacologic comfort measures Outcome: Not Progressing   Problem: Health Behavior/Discharge Planning: Goal: Ability to manage health-related needs will improve Outcome: Not Progressing   Problem: Clinical Measurements: Goal: Ability to maintain clinical measurements within normal limits will improve Outcome: Not Progressing   Problem: Clinical Measurements: Goal: Respiratory complications will improve Outcome: Not Progressing

## 2023-12-26 DIAGNOSIS — J189 Pneumonia, unspecified organism: Secondary | ICD-10-CM | POA: Diagnosis not present

## 2023-12-26 DIAGNOSIS — J9601 Acute respiratory failure with hypoxia: Secondary | ICD-10-CM | POA: Diagnosis not present

## 2023-12-26 LAB — CBC
HCT: 39.4 % (ref 36.0–46.0)
Hemoglobin: 13.4 g/dL (ref 12.0–15.0)
MCH: 37.1 pg — ABNORMAL HIGH (ref 26.0–34.0)
MCHC: 34 g/dL (ref 30.0–36.0)
MCV: 109.1 fL — ABNORMAL HIGH (ref 80.0–100.0)
Platelets: 322 K/uL (ref 150–400)
RBC: 3.61 MIL/uL — ABNORMAL LOW (ref 3.87–5.11)
RDW: 13.5 % (ref 11.5–15.5)
WBC: 8.3 K/uL (ref 4.0–10.5)
nRBC: 0 % (ref 0.0–0.2)

## 2023-12-26 LAB — BASIC METABOLIC PANEL WITH GFR
Anion gap: 9 (ref 5–15)
BUN: 11 mg/dL (ref 6–20)
CO2: 21 mmol/L — ABNORMAL LOW (ref 22–32)
Calcium: 8.1 mg/dL — ABNORMAL LOW (ref 8.9–10.3)
Chloride: 107 mmol/L (ref 98–111)
Creatinine, Ser: 0.51 mg/dL (ref 0.44–1.00)
GFR, Estimated: >60 mL/min (ref >60)
Glucose, Bld: 92 mg/dL (ref 70–99)
Potassium: 3.6 mmol/L (ref 3.5–5.1)
Sodium: 137 mmol/L (ref 135–145)

## 2023-12-26 MED ORDER — TRAZODONE HCL 50 MG PO TABS
50.0000 mg | ORAL_TABLET | Freq: Once | ORAL | Status: AC
  Administered 2023-12-26: 50 mg via ORAL
  Filled 2023-12-26: qty 1

## 2023-12-26 MED ORDER — OXYCODONE HCL 5 MG PO TABS
5.0000 mg | ORAL_TABLET | Freq: Once | ORAL | Status: AC
  Administered 2023-12-26: 5 mg via ORAL
  Filled 2023-12-26: qty 1

## 2023-12-26 NOTE — Progress Notes (Signed)
 Progress Note    Joy Ball  FMW:[id number] DOB: [birth year]  DOA: 12/24/2023 PCP: Edman Marsa PARAS, DO      Brief Narrative:    Medical records reviewed and are as summarized below:  Joy Ball is a 58 y.o. female  with medical history significant for COPD, pulmonary fibrosis, GERD, and adenocarcinoma of the right lung s/p radiation therapy and chronic hypokalemia who presented to the ED for evaluation of cough, left-sided chest pain and shortness of breath.  When EMS arrived, she was hypoxic with oxygen saturation in the 70s.      Assessment/Plan:   Principal Problem:   Acute respiratory failure with hypoxia (HCC) Active Problems:   Shortness of breath   Pneumonia due to infectious organism    Body mass index is 22.46 kg/m.   Community-acquired pneumonia with left-sided pleuritic chest pain, cough and shortness of breath: Respiratory panel was negative.  MRSA screen by PCR was negative.  No growth on blood cultures thus far.  Strep pneumo urine antigen negative.  Legionella pneumophila urine antigen pending. Continue IV ceftriaxone  and azithromycin .   Acute hypoxic respiratory failure: She is requiring 2.5 L/min oxygen via [place].  Wean off oxygen as able.  She was not on supplemental oxygen at home.  Suspect she may need home oxygen   Hypotension: BP is better.  Continue to monitor.   Hypokalemia: Improved.  Patient reports history of recurrent hypokalemia.   COPD (extensive emphysema on CT chest), pulmonary fibrosis: Continue bronchodilators.  Outpatient follow-up with interstitial lung disease clinic for further management.   Primary adenocarcinoma of the right lung, stage Ia, s/p radiation therapy (completed in April 2025).  Outpatient follow-up with radiation oncologist.   Macrocytosis: Vitamin B12 322 and folate 5.7 (slightly below normal)   Comorbidities include CAD, anxiety, tobacco use disorder   Diet Order             Diet  regular Room service appropriate? Yes; Fluid consistency: Thin  Diet effective now                            Consultants: None  Procedures: None    Medications:    budesonide -glycopyrrolate -formoterol   2 puff Inhalation BID   buPROPion   150 mg Oral BID   dextromethorphan -guaiFENesin   1 tablet Oral BID   enoxaparin  (LOVENOX ) injection  40 mg Subcutaneous Q24H   gabapentin   300 mg Oral QHS   lidocaine   1 patch Transdermal Q24H   loratadine   10 mg Oral Daily   naproxen   500 mg Oral BID WC   pantoprazole   40 mg Oral Daily   Continuous Infusions:  azithromycin  Stopped (12/25/23 2212)   cefTRIAXone  (ROCEPHIN )  IV Stopped (12/25/23 2344)     Anti-infectives (From admission, onward)    Start     Dose/Rate Route Frequency Ordered Stop   12/25/23 2300  cefTRIAXone  (ROCEPHIN ) 1 g in sodium chloride  0.9 % 100 mL IVPB        1 g 200 mL/hr over 30 Minutes Intravenous Every 24 hours 12/25/23 0741     12/25/23 2200  azithromycin  (ZITHROMAX ) 500 mg in sodium chloride  0.9 % 250 mL IVPB        500 mg 250 mL/hr over 60 Minutes Intravenous Every 24 hours 12/25/23 0741     12/24/23 2300  cefTRIAXone  (ROCEPHIN ) 1 g in sodium chloride  0.9 % 100 mL IVPB        1  g 200 mL/hr over 30 Minutes Intravenous  Once 12/24/23 2245 12/24/23 2345   12/24/23 2300  azithromycin  (ZITHROMAX ) 500 mg in sodium chloride  0.9 % 250 mL IVPB        500 mg 250 mL/hr over 60 Minutes Intravenous  Once 12/24/23 2245 12/25/23 0110              Family Communication/Anticipated D/C date and plan/Code Status   DVT prophylaxis: enoxaparin  (LOVENOX ) injection 40 mg Start: 12/25/23 0800     Code Status: Full Code  Family Communication: Plan discussed with the boyfriend at the bedside Disposition Plan: Plan to discharge home   Status is: Inpatient Remains inpatient appropriate because: Community-acquired pneumonia with acute hypoxic respiratory failure       Subjective:   Interval  events noted.  She complains of cough and shortness of breath.  Shortness of breath is worse with exertion.  Prentice, boyfriend, was at the bedside.  Objective:    Vitals:   12/25/23 0802 12/25/23 1541 12/25/23 2058 12/26/23 0349  BP: 101/74 124/83 110/73 117/70  Pulse: 78 81 81 98  Resp: 16 20 20 16   Temp: 97.9 F (36.6 C) 98 F (36.7 C) (!) 97.5 F (36.4 C) 97.9 F (36.6 C)  TempSrc: Oral Oral Oral Oral  SpO2: 95% 96% 99% 91%  Weight:      Height:       No data found.   Intake/Output Summary (Last 24 hours) at 12/26/2023 1330 Last data filed at 12/26/2023 0900 Gross per 24 hour  Intake 470 ml  Output --  Net 470 ml   Filed Weights   12/24/23 2107 12/25/23 0043  Weight: 52.2 kg 52.2 kg    Exam:  GEN: NAD SKIN: Warm and dry EYES: No pallor or icterus ENT: MMM CV: RRR PULM: Bibasilar rales.  No wheezing or rhonchi ABD: soft, ND, NT, +BS CNS: AAO x 3, non focal EXT: No edema or tenderness       Data Reviewed:   I have personally reviewed following labs and imaging studies:  Labs: Labs show the following:   Basic Metabolic Panel: Recent Labs  Lab 12/24/23 2147 12/25/23 0659 12/26/23 0437  NA 139 141 137  K 3.0* 4.1 3.6  CL 107 110 107  CO2 20* 22 21*  GLUCOSE 83 91 92  BUN 8 11 11   CREATININE 0.58 0.60 0.51  CALCIUM 8.6* 8.0* 8.1*  MG  --  2.0  --    GFR Estimated Creatinine Clearance: 56.4 mL/min (by C-G formula based on SCr of 0.51 mg/dL). Liver Function Tests: Recent Labs  Lab 12/25/23 0659  AST 31  ALT 19  ALKPHOS 157*  BILITOT 0.5  PROT 5.0*  ALBUMIN 2.4*   No results for input(s): LIPASE, AMYLASE in the last 168 hours. No results for input(s): AMMONIA in the last 168 hours. Coagulation profile No results for input(s): INR, PROTIME in the last 168 hours.  CBC: Recent Labs  Lab 12/24/23 2147 12/25/23 0506 12/26/23 0437  WBC 10.5 11.0* 8.3  NEUTROABS 7.4  --   --   HGB 14.4 13.8 13.4  HCT 42.0 42.1 39.4  MCV  108.2* 113.2* 109.1*  PLT 410* 350 322   Cardiac Enzymes: No results for input(s): CKTOTAL, CKMB, CKMBINDEX, TROPONINI in the last 168 hours. BNP (last 3 results) No results for input(s): PROBNP in the last 8760 hours. CBG: No results for input(s): GLUCAP in the last 168 hours. D-Dimer: No results for input(s): DDIMER in the  last 72 hours. Hgb A1c: No results for input(s): HGBA1C in the last 72 hours. Lipid Profile: No results for input(s): CHOL, HDL, LDLCALC, TRIG, CHOLHDL, LDLDIRECT in the last 72 hours. Thyroid  function studies: No results for input(s): TSH, T4TOTAL, T3FREE, THYROIDAB in the last 72 hours.  Invalid input(s): FREET3 Anemia work up: Recent Labs    12/25/23 0659 12/25/23 1439  VITAMINB12  --  322  FOLATE 5.7*  --    Sepsis Labs: Recent Labs  Lab 12/24/23 2147 12/25/23 0053 12/25/23 0506 12/26/23 0437  PROCALCITON  --  0.16  --   --   WBC 10.5  --  11.0* 8.3    Microbiology Recent Results (from the past 240 hours)  Blood culture (routine x 2)     Status: None (Preliminary result)   Collection Time: 12/24/23 11:00 PM   Specimen: BLOOD  Result Value Ref Range Status   Specimen Description BLOOD BLOOD LEFT ARM  Final   Special Requests   Final    BOTTLES DRAWN AEROBIC AND ANAEROBIC Blood Culture adequate volume   Culture   Final    NO GROWTH 2 DAYS Performed at [facility], [address], [place], [place] [address]    Report Status PENDING  Incomplete  Blood culture (routine x 2)     Status: None (Preliminary result)   Collection Time: 12/24/23 11:20 PM   Specimen: BLOOD  Result Value Ref Range Status   Specimen Description BLOOD BLOOD RIGHT HAND  Final   Special Requests   Final    BOTTLES DRAWN AEROBIC AND ANAEROBIC Blood Culture adequate volume   Culture   Final    NO GROWTH 2 DAYS Performed at [facility], [address] [place], [place] [address]    Report Status PENDING   Incomplete  Respiratory (~20 pathogens) panel by PCR     Status: None   Collection Time: 12/25/23  1:09 AM   Specimen: Nasal Mucosa; Respiratory  Result Value Ref Range Status   Adenovirus NOT DETECTED NOT DETECTED Final   Coronavirus 229E NOT DETECTED NOT DETECTED Final    Comment: (NOTE) The Coronavirus on the Respiratory Panel, DOES NOT test for the novel  Coronavirus (2019 nCoV)    Coronavirus HKU1 NOT DETECTED NOT DETECTED Final   Coronavirus NL63 NOT DETECTED NOT DETECTED Final   Coronavirus OC43 NOT DETECTED NOT DETECTED Final   Metapneumovirus NOT DETECTED NOT DETECTED Final   Rhinovirus / Enterovirus NOT DETECTED NOT DETECTED Final   Influenza A NOT DETECTED NOT DETECTED Final   Influenza B NOT DETECTED NOT DETECTED Final   Parainfluenza Virus 1 NOT DETECTED NOT DETECTED Final   Parainfluenza Virus 2 NOT DETECTED NOT DETECTED Final   Parainfluenza Virus 3 NOT DETECTED NOT DETECTED Final   Parainfluenza Virus 4 NOT DETECTED NOT DETECTED Final   Respiratory Syncytial Virus NOT DETECTED NOT DETECTED Final   Bordetella pertussis NOT DETECTED NOT DETECTED Final   Bordetella Parapertussis NOT DETECTED NOT DETECTED Final   Chlamydophila pneumoniae NOT DETECTED NOT DETECTED Final   Mycoplasma pneumoniae NOT DETECTED NOT DETECTED Final    Comment: Performed at [facility], [address], [place], [place] [address]  MRSA Next Gen by PCR, Nasal     Status: None   Collection Time: 12/25/23  1:09 AM   Specimen: Nasal Mucosa; Nasal Swab  Result Value Ref Range Status   MRSA by PCR Next Gen NOT DETECTED NOT DETECTED Final    Comment: (NOTE) The  GeneXpert MRSA Assay (FDA approved for NASAL specimens only), is one component of a comprehensive MRSA colonization surveillance program. It is not intended to diagnose MRSA infection nor to guide or monitor treatment for MRSA infections. Test performance is not FDA approved in patients less than 12 years old. Performed at [facility], [address], [place], [place] [address]     Procedures and diagnostic studies:  CT Angio Chest PE W and/or Wo Contrast Result Date: 12/24/2023 CLINICAL DATA:  Left-sided chest pain. EXAM: CT ANGIOGRAPHY CHEST WITH CONTRAST TECHNIQUE: Multidetector CT imaging of the chest was performed using the standard protocol during bolus administration of intravenous contrast. Multiplanar CT image reconstructions and MIPs were obtained to evaluate the vascular anatomy. RADIATION DOSE REDUCTION: This exam was performed according to the departmental dose-optimization program which includes automated exposure control, adjustment of the mA and/or kV according to patient size and/or use of iterative reconstruction technique. CONTRAST:  75mL OMNIPAQUE  IOHEXOL  350 MG/ML SOLN COMPARISON:  July 31, 2023 FINDINGS: Cardiovascular: The thoracic aorta is normal in appearance. Satisfactory opacification of the pulmonary arteries to the segmental level. No evidence of pulmonary embolism. Normal heart size. No pericardial effusion. Mediastinum/Nodes: There is mild AP window and mild pretracheal lymphadenopathy. Thyroid  gland, trachea, and esophagus demonstrate no significant findings. Lungs/Pleura: There is evidence of extensive emphysematous lung disease with marked severity, thick band-like areas of biapical scarring. This extends along the posterior aspects of the bilateral upper lobes, right greater than left. The 1.9 cm x 1.5 cm posterolateral right upper lobe pulmonary nodule seen on the prior study has less distinct borders on the current exam and measures approximately 1.7 cm x 1.8 cm (axial CT images 44-53, CT series 5). Mild posteromedial right upper lobe atelectasis and/or infiltrate is present (axial CT images 33 through 48, CT series 5). Mild to moderate severity fibrotic changes are seen along the periphery of the bilateral lung bases. Moderate severity areas of ground-glass appearing lung parenchymal  are seen throughout the right middle lobe and bilateral lower lobes. No pleural effusion or pneumothorax is identified. Upper Abdomen: There is a small hiatal hernia. Musculoskeletal: No chest wall abnormality. No acute or significant osseous findings. Review of the MIP images confirms the above findings. IMPRESSION: 1. No evidence of pulmonary embolism. 2. Extensive emphysematous lung disease with marked severity, thick band-like areas of biapical scarring. 3. The posterolateral pulmonary nodule seen on the prior study has less distinct borders on the current exam. This was evaluated by nuclear medicine PET/CT (August 12, 2023) and remains suspicious for an underlying neoplasm. 4. Moderate severity areas of ground-glass appearing lung parenchymal throughout the right middle lobe and bilateral lower lobes, which may represent sequelae associated with an atypical or viral pneumonia. 5. Mild posteromedial right upper lobe atelectasis and/or infiltrate. 6. Small hiatal hernia. Electronically Signed   By: Suzen Dials M.D.   On: 12/24/2023 22:58   DG Chest Portable 1 View Result Date: 12/24/2023 CLINICAL DATA:  Shortness of breath and left-sided chest pain EXAM: PORTABLE CHEST 1 VIEW COMPARISON:  08/11/2023 FINDINGS: Interstitial coarsening and patchy basilar airspace opacities bilaterally. Additional subpleural irregular opacity along the right upper lung corresponds to the radiotracer avid nodule seen on PET/CT 07/31/2023. No pleural effusion or pneumothorax. No displaced rib fractures. Stable cardiomediastinal silhouette. IMPRESSION: 1. Interstitial coarsening and patchy basilar airspace opacities bilaterally concerning for infection. 2. Subpleural irregular opacity along the right upper lung corresponds to the radiotracer avid nodule seen on PET/CT 07/31/2023. Electronically Signed  By: Norman Gatlin M.D.   On: 12/24/2023 21:44               LOS: 2 days   Latisha Lasch  [facility]    Pager on [email or web address]. If 7PM-7AM, please contact night-coverage at [email or web address]     12/26/2023, 1:30 PM

## 2023-12-27 DIAGNOSIS — J9601 Acute respiratory failure with hypoxia: Secondary | ICD-10-CM | POA: Diagnosis not present

## 2023-12-27 LAB — GLUCOSE, CAPILLARY: Glucose-Capillary: 89 mg/dL (ref 70–99)

## 2023-12-27 NOTE — Progress Notes (Signed)
 Progress Note    Joy Ball  FMW:[id number] DOB: Jun 24, 1965  DOA: 12/24/2023 PCP: Edman Marsa PARAS, DO      Brief Narrative:    Medical records reviewed and are as summarized below:  Joy Ball is a 58 y.o. female  with medical history significant for COPD, pulmonary fibrosis, GERD, and adenocarcinoma of the right lung s/p radiation therapy and chronic hypokalemia who presented to the ED for evaluation of cough, left-sided chest pain and shortness of breath.  When EMS arrived, she was hypoxic with oxygen saturation in the 70s.      Assessment/Plan:   Principal Problem:   Acute respiratory failure with hypoxia (HCC) Active Problems:   Shortness of breath   Pneumonia due to infectious organism    Body mass index is 22.46 kg/m.   Community-acquired pneumonia with left-sided pleuritic chest pain, cough and shortness of breath, immunocompromised patient: Respiratory panel was negative.  MRSA screen by PCR was negative.  No growth on blood cultures thus far.  Strep pneumo urine antigen negative.  Legionella pneumophila urine antigen pending. Continue IV azithromycin  and ceftriaxone .   Acute hypoxic respiratory failure: She was on 4 L/min oxygen this morning.  Wean down oxygen as able and check pulse oximetry with ambulation.  She will likely need home oxygen.   Hypotension: BP has improved and stable.   Hypokalemia: Improved.  Patient reports history of recurrent hypokalemia.   COPD (extensive emphysema on CT chest), pulmonary fibrosis: Continue bronchodilators.  Outpatient follow-up with interstitial lung disease clinic for further management.   Primary adenocarcinoma of the right lung, stage Ia, s/p radiation therapy (completed in April 2025).  Outpatient follow-up with radiation oncologist.   Macrocytosis: Vitamin B12 322 and folate 5.7 (slightly below normal)   Comorbidities include CAD, anxiety, tobacco use disorder   Diet Order              Diet regular Room service appropriate? Yes; Fluid consistency: Thin  Diet effective now                            Consultants: None  Procedures: None    Medications:    budesonide -glycopyrrolate -formoterol   2 puff Inhalation BID   buPROPion   150 mg Oral BID   dextromethorphan -guaiFENesin   1 tablet Oral BID   enoxaparin  (LOVENOX ) injection  40 mg Subcutaneous Q24H   gabapentin   300 mg Oral QHS   lidocaine   1 patch Transdermal Q24H   loratadine   10 mg Oral Daily   naproxen   500 mg Oral BID WC   pantoprazole   40 mg Oral Daily   Continuous Infusions:  azithromycin  500 mg (12/26/23 2200)   cefTRIAXone  (ROCEPHIN )  IV 1 g (12/26/23 2352)     Anti-infectives (From admission, onward)    Start     Dose/Rate Route Frequency Ordered Stop   12/25/23 2300  cefTRIAXone  (ROCEPHIN ) 1 g in sodium chloride  0.9 % 100 mL IVPB        1 g 200 mL/hr over 30 Minutes Intravenous Every 24 hours 12/25/23 0741     12/25/23 2200  azithromycin  (ZITHROMAX ) 500 mg in sodium chloride  0.9 % 250 mL IVPB        500 mg 250 mL/hr over 60 Minutes Intravenous Every 24 hours 12/25/23 0741     12/24/23 2300  cefTRIAXone  (ROCEPHIN ) 1 g in sodium chloride  0.9 % 100 mL IVPB        1 g  200 mL/hr over 30 Minutes Intravenous  Once 12/24/23 2245 12/24/23 2345   12/24/23 2300  azithromycin  (ZITHROMAX ) 500 mg in sodium chloride  0.9 % 250 mL IVPB        500 mg 250 mL/hr over 60 Minutes Intravenous  Once 12/24/23 2245 12/25/23 0110              Family Communication/Anticipated D/C date and plan/Code Status   DVT prophylaxis: enoxaparin  (LOVENOX ) injection 40 mg Start: 12/25/23 0800     Code Status: Full Code  Family Communication: None  Disposition Plan: Plan to discharge home   Status is: Inpatient Remains inpatient appropriate because: Community-acquired pneumonia with acute hypoxic respiratory failure       Subjective:   She complains of intermittent cough.  She still  feels a little short of breath. She said her oxygen was bumped up to 4 L this morning.  She does not feel quite ready for discharge yet.  Objective:    Vitals:   12/26/23 2120 12/27/23 0357 12/27/23 0745 12/27/23 1444  BP: 113/80 98/68 106/73 121/79  Pulse: 85 89 87 92  Resp: 16 16 18 18   Temp: 98.1 F (36.7 C) 98.4 F (36.9 C) 98.2 F (36.8 C) 98.1 F (36.7 C)  TempSrc:   Oral Oral  SpO2: 94% 91% 94% 90%  Weight:      Height:       No data found.   Intake/Output Summary (Last 24 hours) at 12/27/2023 1559 Last data filed at 12/27/2023 1500 Gross per 24 hour  Intake 240 ml  Output --  Net 240 ml   Filed Weights   12/24/23 2107 12/25/23 0043  Weight: 52.2 kg 52.2 kg    Exam:  GEN: NAD SKIN: Warm and dry EYES: Anicteric ENT: MMM CV: RRR PULM: Bibasilar fine rales ABD: soft, ND, NT, +BS CNS: AAO x 3, non focal EXT: No edema or tenderness      Data Reviewed:   I have personally reviewed following labs and imaging studies:  Labs: Labs show the following:   Basic Metabolic Panel: Recent Labs  Lab 12/24/23 2147 12/25/23 0659 12/26/23 0437  NA 139 141 137  K 3.0* 4.1 3.6  CL 107 110 107  CO2 20* 22 21*  GLUCOSE 83 91 92  BUN 8 11 11   CREATININE 0.58 0.60 0.51  CALCIUM 8.6* 8.0* 8.1*  MG  --  2.0  --    GFR Estimated Creatinine Clearance: 56.4 mL/min (by C-G formula based on SCr of 0.51 mg/dL). Liver Function Tests: Recent Labs  Lab 12/25/23 0659  AST 31  ALT 19  ALKPHOS 157*  BILITOT 0.5  PROT 5.0*  ALBUMIN 2.4*   No results for input(s): LIPASE, AMYLASE in the last 168 hours. No results for input(s): AMMONIA in the last 168 hours. Coagulation profile No results for input(s): INR, PROTIME in the last 168 hours.  CBC: Recent Labs  Lab 12/24/23 2147 12/25/23 0506 12/26/23 0437  WBC 10.5 11.0* 8.3  NEUTROABS 7.4  --   --   HGB 14.4 13.8 13.4  HCT 42.0 42.1 39.4  MCV 108.2* 113.2* 109.1*  PLT 410* 350 322   Cardiac  Enzymes: No results for input(s): CKTOTAL, CKMB, CKMBINDEX, TROPONINI in the last 168 hours. BNP (last 3 results) No results for input(s): PROBNP in the last 8760 hours. CBG: Recent Labs  Lab 12/27/23 1553  GLUCAP 89   D-Dimer: No results for input(s): DDIMER in the last 72 hours. Hgb A1c: No  results for input(s): HGBA1C in the last 72 hours. Lipid Profile: No results for input(s): CHOL, HDL, LDLCALC, TRIG, CHOLHDL, LDLDIRECT in the last 72 hours. Thyroid  function studies: No results for input(s): TSH, T4TOTAL, T3FREE, THYROIDAB in the last 72 hours.  Invalid input(s): FREET3 Anemia work up: Recent Labs    12/25/23 0659 12/25/23 1439  VITAMINB12  --  322  FOLATE 5.7*  --    Sepsis Labs: Recent Labs  Lab 12/24/23 2147 12/25/23 0053 12/25/23 0506 12/26/23 0437  PROCALCITON  --  0.16  --   --   WBC 10.5  --  11.0* 8.3    Microbiology Recent Results (from the past 240 hours)  Blood culture (routine x 2)     Status: None (Preliminary result)   Collection Time: 12/24/23 11:00 PM   Specimen: BLOOD  Result Value Ref Range Status   Specimen Description BLOOD BLOOD LEFT ARM  Final   Special Requests   Final    BOTTLES DRAWN AEROBIC AND ANAEROBIC Blood Culture adequate volume   Culture   Final    NO GROWTH 3 DAYS Performed at [facility], [address], [place], [place] [address]    Report Status PENDING  Incomplete  Blood culture (routine x 2)     Status: None (Preliminary result)   Collection Time: 12/24/23 11:20 PM   Specimen: BLOOD  Result Value Ref Range Status   Specimen Description BLOOD BLOOD RIGHT HAND  Final   Special Requests   Final    BOTTLES DRAWN AEROBIC AND ANAEROBIC Blood Culture adequate volume   Culture   Final    NO GROWTH 3 DAYS Performed at [facility], [address], [place], [place] [address]    Report Status PENDING  Incomplete  Respiratory (~20 pathogens) panel by PCR      Status: None   Collection Time: 12/25/23  1:09 AM   Specimen: Nasal Mucosa; Respiratory  Result Value Ref Range Status   Adenovirus NOT DETECTED NOT DETECTED Final   Coronavirus 229E NOT DETECTED NOT DETECTED Final    Comment: (NOTE) The Coronavirus on the Respiratory Panel, DOES NOT test for the novel  Coronavirus (2019 nCoV)    Coronavirus HKU1 NOT DETECTED NOT DETECTED Final   Coronavirus NL63 NOT DETECTED NOT DETECTED Final   Coronavirus OC43 NOT DETECTED NOT DETECTED Final   Metapneumovirus NOT DETECTED NOT DETECTED Final   Rhinovirus / Enterovirus NOT DETECTED NOT DETECTED Final   Influenza A NOT DETECTED NOT DETECTED Final   Influenza B NOT DETECTED NOT DETECTED Final   Parainfluenza Virus 1 NOT DETECTED NOT DETECTED Final   Parainfluenza Virus 2 NOT DETECTED NOT DETECTED Final   Parainfluenza Virus 3 NOT DETECTED NOT DETECTED Final   Parainfluenza Virus 4 NOT DETECTED NOT DETECTED Final   Respiratory Syncytial Virus NOT DETECTED NOT DETECTED Final   Bordetella pertussis NOT DETECTED NOT DETECTED Final   Bordetella Parapertussis NOT DETECTED NOT DETECTED Final   Chlamydophila pneumoniae NOT DETECTED NOT DETECTED Final   Mycoplasma pneumoniae NOT DETECTED NOT DETECTED Final    Comment: Performed at [facility], [address], [place], [place] [address]  MRSA Next Gen by PCR, Nasal     Status: None   Collection Time: 12/25/23  1:09 AM   Specimen: Nasal Mucosa; Nasal Swab  Result Value Ref Range Status   MRSA by PCR Next Gen NOT DETECTED NOT DETECTED Final    Comment: (NOTE) The GeneXpert MRSA Assay (FDA approved for  NASAL specimens only), is one component of a comprehensive MRSA colonization surveillance program. It is not intended to diagnose MRSA infection nor to guide or monitor treatment for MRSA infections. Test performance is not FDA approved in patients less than 36 years old. Performed at [facility], [address] [place], [place] [address]      Procedures and diagnostic studies:  No results found.              LOS: 3 days   Imelda Dandridge  [facility]   Pager on [email or web address]. If 7PM-7AM, please contact night-coverage at [email or web address]     12/27/2023, 3:59 PM

## 2023-12-27 NOTE — Plan of Care (Signed)
 Problem: Education: Goal: Knowledge of General Education information will improve Description: Including pain rating scale, medication(s)/side effects and non-pharmacologic comfort measures 12/27/2023 0705 by Janit Orvin HERO, RN Outcome: Progressing 12/27/2023 0704 by Janit Orvin HERO, RN Outcome: Progressing 12/27/2023 0704 by Janit Orvin HERO, RN Outcome: Progressing   Problem: Health Behavior/Discharge Planning: Goal: Ability to manage health-related needs will improve 12/27/2023 0705 by Janit Orvin HERO, RN Outcome: Progressing 12/27/2023 0704 by Janit Orvin HERO, RN Outcome: Progressing 12/27/2023 0704 by Janit Orvin HERO, RN Outcome: Progressing   Problem: Clinical Measurements: Goal: Ability to maintain clinical measurements within normal limits will improve 12/27/2023 0705 by Janit Orvin HERO, RN Outcome: Progressing 12/27/2023 0704 by Janit Orvin HERO, RN Outcome: Progressing 12/27/2023 0704 by Janit Orvin HERO, RN Outcome: Progressing Goal: Will remain free from infection 12/27/2023 0705 by Janit Orvin HERO, RN Outcome: Progressing 12/27/2023 0704 by Janit Orvin HERO, RN Outcome: Progressing 12/27/2023 0704 by Janit Orvin HERO, RN Outcome: Progressing Goal: Diagnostic test results will improve 12/27/2023 0705 by Janit Orvin HERO, RN Outcome: Progressing 12/27/2023 0704 by Janit Orvin HERO, RN Outcome: Progressing 12/27/2023 0704 by Janit Orvin HERO, RN Outcome: Progressing Goal: Respiratory complications will improve 12/27/2023 0705 by Janit Orvin HERO, RN Outcome: Progressing 12/27/2023 0704 by Janit Orvin HERO, RN Outcome: Progressing 12/27/2023 0704 by Janit Orvin HERO, RN Outcome: Progressing Goal: Cardiovascular complication will be avoided 12/27/2023 0705 by Janit Orvin HERO, RN Outcome: Progressing 12/27/2023 0704 by Janit Orvin HERO, RN Outcome: Progressing 12/27/2023 0704 by Janit Orvin HERO, RN Outcome: Progressing   Problem: Activity: Goal: Risk for activity intolerance will decrease 12/27/2023 0705 by Janit Orvin HERO,  RN Outcome: Progressing 12/27/2023 0704 by Janit Orvin HERO, RN Outcome: Progressing 12/27/2023 0704 by Janit Orvin HERO, RN Outcome: Progressing   Problem: Nutrition: Goal: Adequate nutrition will be maintained 12/27/2023 0705 by Janit Orvin HERO, RN Outcome: Progressing 12/27/2023 0704 by Janit Orvin HERO, RN Outcome: Progressing 12/27/2023 0704 by Janit Orvin HERO, RN Outcome: Progressing   Problem: Coping: Goal: Level of anxiety will decrease 12/27/2023 0705 by Janit Orvin HERO, RN Outcome: Progressing 12/27/2023 0704 by Janit Orvin HERO, RN Outcome: Progressing 12/27/2023 0704 by Janit Orvin HERO, RN Outcome: Progressing   Problem: Elimination: Goal: Will not experience complications related to bowel motility 12/27/2023 0705 by Janit Orvin HERO, RN Outcome: Progressing 12/27/2023 0704 by Janit Orvin HERO, RN Outcome: Progressing 12/27/2023 0704 by Janit Orvin HERO, RN Outcome: Progressing Goal: Will not experience complications related to urinary retention 12/27/2023 0705 by Janit Orvin HERO, RN Outcome: Progressing 12/27/2023 0704 by Janit Orvin HERO, RN Outcome: Progressing 12/27/2023 0704 by Janit Orvin HERO, RN Outcome: Progressing   Problem: Pain Managment: Goal: General experience of comfort will improve and/or be controlled 12/27/2023 0705 by Janit Orvin HERO, RN Outcome: Progressing 12/27/2023 0704 by Janit Orvin HERO, RN Outcome: Progressing 12/27/2023 0704 by Janit Orvin HERO, RN Outcome: Progressing   Problem: Safety: Goal: Ability to remain free from injury will improve 12/27/2023 0705 by Janit Orvin HERO, RN Outcome: Progressing 12/27/2023 0704 by Janit Orvin HERO, RN Outcome: Progressing 12/27/2023 0704 by Janit Orvin HERO, RN Outcome: Progressing   Problem: Skin Integrity: Goal: Risk for impaired skin integrity will decrease 12/27/2023 0705 by Janit Orvin HERO, RN Outcome: Progressing 12/27/2023 0704 by Janit Orvin HERO, RN Outcome: Progressing 12/27/2023 0704 by Janit Orvin HERO, RN Outcome: Progressing   Problem:  Activity: Goal: Ability to tolerate increased activity will improve 12/27/2023 0705 by Janit Orvin HERO, RN Outcome: Progressing 12/27/2023 0704 by Janit,  Orvin HERO, RN Outcome: Progressing 12/27/2023 0704 by Janit Orvin HERO, RN Outcome: Progressing   Problem: Clinical Measurements: Goal: Ability to maintain a body temperature in the normal range will improve 12/27/2023 0705 by Janit Orvin HERO, RN Outcome: Progressing 12/27/2023 0704 by Janit Orvin HERO, RN Outcome: Progressing 12/27/2023 0704 by Janit Orvin HERO, RN Outcome: Progressing   Problem: Respiratory: Goal: Ability to maintain adequate ventilation will improve 12/27/2023 0705 by Janit Orvin HERO, RN Outcome: Progressing 12/27/2023 0704 by Janit Orvin HERO, RN Outcome: Progressing 12/27/2023 0704 by Janit Orvin HERO, RN Outcome: Progressing Goal: Ability to maintain a clear airway will improve 12/27/2023 0705 by Janit Orvin HERO, RN Outcome: Progressing 12/27/2023 0704 by Janit Orvin HERO, RN Outcome: Progressing 12/27/2023 0704 by Janit Orvin HERO, RN Outcome: Progressing

## 2023-12-27 NOTE — Plan of Care (Signed)

## 2023-12-27 NOTE — TOC Progression Note (Addendum)
 Pulse Oximetry Ambulation Test  O2 saturation @ rest on RA: 85%  O2 saturation @ rest on OXYGEN: 91% 4.5L  O2 saturation ambulating on RA: 79%  O2 saturation ambulating on OXYGEN: 88% 4.5L

## 2023-12-27 NOTE — Plan of Care (Signed)
  Problem: Education: Goal: Knowledge of General Education information will improve Description: Including pain rating scale, medication(s)/side effects and non-pharmacologic comfort measures 12/27/2023 0704 by Janit Orvin HERO, RN Outcome: Progressing 12/27/2023 0704 by Janit Orvin HERO, RN Outcome: Progressing   Problem: Health Behavior/Discharge Planning: Goal: Ability to manage health-related needs will improve 12/27/2023 0704 by Janit Orvin HERO, RN Outcome: Progressing 12/27/2023 0704 by Janit Orvin HERO, RN Outcome: Progressing   Problem: Clinical Measurements: Goal: Ability to maintain clinical measurements within normal limits will improve 12/27/2023 0704 by Janit Orvin HERO, RN Outcome: Progressing 12/27/2023 0704 by Janit Orvin HERO, RN Outcome: Progressing Goal: Will remain free from infection 12/27/2023 0704 by Janit Orvin HERO, RN Outcome: Progressing 12/27/2023 0704 by Janit Orvin HERO, RN Outcome: Progressing Goal: Diagnostic test results will improve 12/27/2023 0704 by Janit Orvin HERO, RN Outcome: Progressing 12/27/2023 0704 by Janit Orvin HERO, RN Outcome: Progressing Goal: Respiratory complications will improve 12/27/2023 0704 by Janit Orvin HERO, RN Outcome: Progressing 12/27/2023 0704 by Janit Orvin HERO, RN Outcome: Progressing Goal: Cardiovascular complication will be avoided 12/27/2023 0704 by Janit Orvin HERO, RN Outcome: Progressing 12/27/2023 0704 by Janit Orvin HERO, RN Outcome: Progressing   Problem: Activity: Goal: Risk for activity intolerance will decrease 12/27/2023 0704 by Janit Orvin HERO, RN Outcome: Progressing 12/27/2023 0704 by Janit Orvin HERO, RN Outcome: Progressing   Problem: Nutrition: Goal: Adequate nutrition will be maintained 12/27/2023 0704 by Janit Orvin HERO, RN Outcome: Progressing 12/27/2023 0704 by Janit Orvin HERO, RN Outcome: Progressing   Problem: Coping: Goal: Level of anxiety will decrease 12/27/2023 0704 by Janit Orvin HERO, RN Outcome: Progressing 12/27/2023 0704 by Janit Orvin HERO, RN Outcome: Progressing   Problem: Elimination: Goal: Will not experience complications related to bowel motility 12/27/2023 0704 by Janit Orvin HERO, RN Outcome: Progressing 12/27/2023 0704 by Janit Orvin HERO, RN Outcome: Progressing Goal: Will not experience complications related to urinary retention 12/27/2023 0704 by Janit Orvin HERO, RN Outcome: Progressing 12/27/2023 0704 by Janit Orvin HERO, RN Outcome: Progressing   Problem: Pain Managment: Goal: General experience of comfort will improve and/or be controlled 12/27/2023 0704 by Janit Orvin HERO, RN Outcome: Progressing 12/27/2023 0704 by Janit Orvin HERO, RN Outcome: Progressing   Problem: Safety: Goal: Ability to remain free from injury will improve 12/27/2023 0704 by Janit Orvin HERO, RN Outcome: Progressing 12/27/2023 0704 by Janit Orvin HERO, RN Outcome: Progressing   Problem: Skin Integrity: Goal: Risk for impaired skin integrity will decrease 12/27/2023 0704 by Janit Orvin HERO, RN Outcome: Progressing 12/27/2023 0704 by Janit Orvin HERO, RN Outcome: Progressing   Problem: Activity: Goal: Ability to tolerate increased activity will improve 12/27/2023 0704 by Janit Orvin HERO, RN Outcome: Progressing 12/27/2023 0704 by Janit Orvin HERO, RN Outcome: Progressing   Problem: Clinical Measurements: Goal: Ability to maintain a body temperature in the normal range will improve 12/27/2023 0704 by Janit Orvin HERO, RN Outcome: Progressing 12/27/2023 0704 by Janit Orvin HERO, RN Outcome: Progressing   Problem: Respiratory: Goal: Ability to maintain adequate ventilation will improve 12/27/2023 0704 by Janit Orvin HERO, RN Outcome: Progressing 12/27/2023 0704 by Janit Orvin HERO, RN Outcome: Progressing Goal: Ability to maintain a clear airway will improve 12/27/2023 0704 by Janit Orvin HERO, RN Outcome: Progressing 12/27/2023 0704 by Janit Orvin HERO, RN Outcome: Progressing

## 2023-12-28 DIAGNOSIS — J441 Chronic obstructive pulmonary disease with (acute) exacerbation: Secondary | ICD-10-CM

## 2023-12-28 DIAGNOSIS — J9601 Acute respiratory failure with hypoxia: Secondary | ICD-10-CM | POA: Diagnosis not present

## 2023-12-28 LAB — SARS CORONAVIRUS 2 BY RT PCR: SARS Coronavirus 2 by RT PCR: NEGATIVE

## 2023-12-28 MED ORDER — IPRATROPIUM-ALBUTEROL 0.5-2.5 (3) MG/3ML IN SOLN
3.0000 mL | RESPIRATORY_TRACT | Status: DC
  Administered 2023-12-28 (×3): 3 mL via RESPIRATORY_TRACT
  Filled 2023-12-28 (×3): qty 3

## 2023-12-28 MED ORDER — IPRATROPIUM-ALBUTEROL 0.5-2.5 (3) MG/3ML IN SOLN
3.0000 mL | Freq: Four times a day (QID) | RESPIRATORY_TRACT | Status: DC
  Administered 2023-12-29 – 2024-01-03 (×23): 3 mL via RESPIRATORY_TRACT
  Filled 2023-12-28 (×22): qty 3

## 2023-12-28 MED ORDER — LEVOFLOXACIN 500 MG PO TABS
750.0000 mg | ORAL_TABLET | Freq: Every day | ORAL | Status: AC
  Administered 2023-12-28 – 2023-12-30 (×3): 750 mg via ORAL
  Filled 2023-12-28: qty 1
  Filled 2023-12-28: qty 2
  Filled 2023-12-28: qty 1

## 2023-12-28 MED ORDER — BUDESONIDE 0.5 MG/2ML IN SUSP
0.5000 mg | Freq: Two times a day (BID) | RESPIRATORY_TRACT | Status: DC
  Administered 2023-12-28 – 2024-01-04 (×15): 0.5 mg via RESPIRATORY_TRACT
  Filled 2023-12-28 (×15): qty 2

## 2023-12-28 MED ORDER — METHYLPREDNISOLONE SODIUM SUCC 40 MG IJ SOLR: 40.0000 mg | Freq: Two times a day (BID) | INTRAMUSCULAR | Status: DC

## 2023-12-28 MED ORDER — LIDOCAINE 5 % EX PTCH
1.0000 | MEDICATED_PATCH | CUTANEOUS | Status: DC
  Administered 2023-12-28 – 2024-01-03 (×7): 1 via TRANSDERMAL
  Filled 2023-12-28 (×9): qty 1

## 2023-12-28 MED ORDER — METHYLPREDNISOLONE SODIUM SUCC 125 MG IJ SOLR: 125.0000 mg | Freq: Two times a day (BID) | INTRAMUSCULAR | Status: DC

## 2023-12-28 MED ORDER — METHYLPREDNISOLONE SODIUM SUCC 40 MG IJ SOLR
40.0000 mg | Freq: Two times a day (BID) | INTRAMUSCULAR | Status: DC
  Administered 2023-12-28 – 2024-01-02 (×10): 40 mg via INTRAVENOUS
  Filled 2023-12-28 (×10): qty 1

## 2023-12-28 MED ORDER — GUAIFENESIN ER 600 MG PO TB12
600.0000 mg | ORAL_TABLET | Freq: Two times a day (BID) | ORAL | Status: DC
  Administered 2023-12-28 – 2024-01-04 (×15): 600 mg via ORAL
  Filled 2023-12-28 (×15): qty 1

## 2023-12-28 MED ORDER — METHYLPREDNISOLONE SODIUM SUCC 125 MG IJ SOLR
120.0000 mg | INTRAMUSCULAR | Status: DC
  Administered 2023-12-28: 120 mg via INTRAVENOUS
  Filled 2023-12-28: qty 2

## 2023-12-28 MED ORDER — LIDOCAINE 5 % EX PTCH: 1.0000 | MEDICATED_PATCH | CUTANEOUS | Status: DC

## 2023-12-28 NOTE — Progress Notes (Signed)
 PHARMACIST - PHYSICIAN COMMUNICATION   CONCERNING: Methylprednisolone  IV    Current order: Methylprednisolone  IV 40 mg IV every 8 hours     DESCRIPTION: Per [facility] Protocol:   IV methylprednisolone  will be converted to either a q12h or q24h frequency with the same total daily dose (TDD).  Ordered Dose: 1 to 125 mg TDD; convert to: TDD q24h.  Ordered Dose: 126 to 250 mg TDD; convert to: TDD div q12h.  Ordered Dose: >250 mg TDD; DAW.  Order has been adjusted to: Methylprednisolone  IV 120 mg IV every 24 hours   Kayla JULIANNA Blew , PharmD, BCPS Clinical Pharmacist  12/28/2023 10:30 AM

## 2023-12-28 NOTE — Consult Note (Addendum)
 [facility] Consultation      Date: 12/28/2023,   MRN# [id number] Joy Ball 18-Dec-1965     CHIEF COMPLAINT:    SOB  HISTORY OF PRESENT ILLNESS   58 y.o. female  with medical history significant for COPD, pulmonary fibrosis, GERD, and adenocarcinoma of the right lung s/p radiation therapy and chronic hypokalemia who presented to the ED for evaluation of cough, left-sided chest pain and shortness of breath.  When EMS arrived, she was hypoxic with oxygen saturation in the 70s.  Patient with increased work of breathing shortness of breath currently on oxygen therapy states that she is not feeling any better  Denies any fevers at this time Patient with extensive smoking history  CT of the chest reviewed with patient in detail Significant emphysema interstitial lung disease Right upper lobe mass seems to dissipate with radiation therapy Underlying pneumonitis and pneumonia is a possibility No evidence of pulmonary embolism  Will plan for frequent bronchodilator therapy and adjusting steroids with continuation of antibiotics     PAST MEDICAL HISTORY   Past Medical History:  Diagnosis Date   Asthma    Cancer (HCC)    COPD (chronic obstructive pulmonary disease) (HCC)    Dyspnea    Emphysema lung (HCC)    GERD (gastroesophageal reflux disease)    Lung nodule    Migraines    Pulmonary fibrosis (HCC)    Seasonal allergies      SURGICAL HISTORY   Past Surgical History:  Procedure Laterality Date   CESAREAN SECTION     EYE SURGERY Bilateral 01/2023   TUBAL LIGATION       FAMILY HISTORY   Family History  Problem Relation Age of Onset   Lung cancer Father        was a smoker   Heart disease Maternal Grandfather    Emphysema Mother      SOCIAL HISTORY   Social History   Tobacco Use   Smoking status: Every Day    Current packs/day: 0.00    Average packs/day: 1.5 packs/day for 81.7 years (121.3 ttl pk-yrs)    Types: Cigarettes     Start date: 16    Last attempt to quit: 02/22/2023    Years since quitting: 0.8   Smokeless tobacco: Current  Substance Use Topics   Alcohol use: Yes    Alcohol/week: 0.0 standard drinks of alcohol    Comment: occ   Drug use: No     MEDICATIONS    Home Medication:    Current Medication:  Current Facility-Administered Medications:    acetaminophen  (TYLENOL ) tablet 650 mg, 650 mg, Oral, Q6H PRN, 650 mg at 12/25/23 1500 **OR** acetaminophen  (TYLENOL ) suppository 650 mg, 650 mg, Rectal, Q6H PRN, Lou, Claretta HERO, MD   budesonide  (PULMICORT ) nebulizer solution 0.5 mg, 0.5 mg, Nebulization, BID, Viktorya Arguijo, MD   buPROPion  (WELLBUTRIN  SR) 12 hr tablet 150 mg, 150 mg, Oral, BID, Amponsah, Prosper M, MD, 150 mg at 12/28/23 1040   dextromethorphan -guaiFENesin  (MUCINEX  DM) 30-600 MG per 12 hr tablet 1 tablet, 1 tablet, Oral, BID, Lou Claretta HERO, MD, 1 tablet at 12/28/23 1040   enoxaparin  (LOVENOX ) injection 40 mg, 40 mg, Subcutaneous, Q24H, Amponsah, Prosper M, MD, 40 mg at 12/28/23 1040   fluticasone  (FLONASE ) 50 MCG/ACT nasal spray 2 spray, 2 spray, Each Nare, Daily PRN, Lou Claretta HERO, MD   gabapentin  (NEURONTIN ) capsule 300 mg, 300 mg, Oral, QHS, Lou Claretta HERO, MD, 300 mg at 12/27/23 2055   guaiFENesin  (  MUCINEX ) 12 hr tablet 600 mg, 600 mg, Oral, BID, Mansy, Jan A, MD, 600 mg at 12/28/23 1040   ipratropium (ATROVENT ) 0.03 % nasal spray 2 spray, 2 spray, Each Nare, BID PRN, Lou Claretta HERO, MD   ipratropium-albuterol  (DUONEB) 0.5-2.5 (3) MG/3ML nebulizer solution 3 mL, 3 mL, Nebulization, Q4H, Elo Marmolejos, MD   levofloxacin  (LEVAQUIN ) tablet 750 mg, 750 mg, Oral, Daily, Jens Durand, MD   lidocaine  (LIDODERM ) 5 % 1 patch, 1 patch, Transdermal, Q24H, Jens Durand, MD   loratadine  (CLARITIN ) tablet 10 mg, 10 mg, Oral, Daily, Lou, Claretta HERO, MD, 10 mg at 12/28/23 1040   methylPREDNISolone  sodium succinate (SOLU-MEDROL ) 40 mg/mL injection 40 mg, 40 mg,  Intravenous, Q12H, Corbyn Steedman, MD   naproxen  (NAPROSYN ) tablet 500 mg, 500 mg, Oral, BID WC, Amponsah, Prosper M, MD, 500 mg at 12/28/23 1040   ondansetron  (ZOFRAN ) tablet 4 mg, 4 mg, Oral, Q6H PRN **OR** ondansetron  (ZOFRAN ) injection 4 mg, 4 mg, Intravenous, Q6H PRN, Lou Claretta HERO, MD, 4 mg at 12/25/23 0109   pantoprazole  (PROTONIX ) EC tablet 40 mg, 40 mg, Oral, Daily, Lou Claretta HERO, MD, 40 mg at 12/28/23 1040   senna-docusate (Senokot-S) tablet 1 tablet, 1 tablet, Oral, QHS PRN, Lou, Claretta HERO, MD    ALLERGIES   Amoxicillin   BP (!) 114/90 (BP Location: Left Arm)   Pulse 89   Temp 98.5 F (36.9 C)   Resp 14   Ht 5' (1.524 m)   Wt 52.2 kg   SpO2 90%   BMI 22.46 kg/m    Review of Systems: Gen:  Denies  fever, sweats, chills weight loss  HEENT: Denies blurred vision, double vision, ear pain, eye pain, hearing loss, nose bleeds, sore throat Cardiac:  No dizziness, chest pain or heaviness, chest tightness,edema, No JVD Resp:   +cough, -sputum production, +shortness of breath,-wheezing, -hemoptysis,  Other:  All other systems negative   Physical Examination:   General Appearance: No distress  EYES PERRLA, EOM intact.   NECK Supple, No JVD Pulmonary: normal breath sounds, No wheezing.  CardiovascularNormal S1,S2.  No m/r/g.   Abdomen: Benign, Soft, non-tender. Neurology UE/LE 5/5 strength, no focal deficits Ext pulses intact, cap refill intact ALL OTHER ROS ARE NEGATIVE      IMAGING    CT Angio Chest PE W and/or Wo Contrast Result Date: 12/24/2023 CLINICAL DATA:  Left-sided chest pain. EXAM: CT ANGIOGRAPHY CHEST WITH CONTRAST TECHNIQUE: Multidetector CT imaging of the chest was performed using the standard protocol during bolus administration of intravenous contrast. Multiplanar CT image reconstructions and MIPs were obtained to evaluate the vascular anatomy. RADIATION DOSE REDUCTION: This exam was performed according to the departmental  dose-optimization program which includes automated exposure control, adjustment of the mA and/or kV according to patient size and/or use of iterative reconstruction technique. CONTRAST:  75mL OMNIPAQUE  IOHEXOL  350 MG/ML SOLN COMPARISON:  July 31, 2023 FINDINGS: Cardiovascular: The thoracic aorta is normal in appearance. Satisfactory opacification of the pulmonary arteries to the segmental level. No evidence of pulmonary embolism. Normal heart size. No pericardial effusion. Mediastinum/Nodes: There is mild AP window and mild pretracheal lymphadenopathy. Thyroid  gland, trachea, and esophagus demonstrate no significant findings. Lungs/Pleura: There is evidence of extensive emphysematous lung disease with marked severity, thick band-like areas of biapical scarring. This extends along the posterior aspects of the bilateral upper lobes, right greater than left. The 1.9 cm x 1.5 cm posterolateral right upper lobe pulmonary nodule seen on the prior study has less distinct borders on  the current exam and measures approximately 1.7 cm x 1.8 cm (axial CT images 44-53, CT series 5). Mild posteromedial right upper lobe atelectasis and/or infiltrate is present (axial CT images 33 through 48, CT series 5). Mild to moderate severity fibrotic changes are seen along the periphery of the bilateral lung bases. Moderate severity areas of ground-glass appearing lung parenchymal are seen throughout the right middle lobe and bilateral lower lobes. No pleural effusion or pneumothorax is identified. Upper Abdomen: There is a small hiatal hernia. Musculoskeletal: No chest wall abnormality. No acute or significant osseous findings. Review of the MIP images confirms the above findings. IMPRESSION: 1. No evidence of pulmonary embolism. 2. Extensive emphysematous lung disease with marked severity, thick band-like areas of biapical scarring. 3. The posterolateral pulmonary nodule seen on the prior study has less distinct borders on the current  exam. This was evaluated by nuclear medicine PET/CT (August 12, 2023) and remains suspicious for an underlying neoplasm. 4. Moderate severity areas of ground-glass appearing lung parenchymal throughout the right middle lobe and bilateral lower lobes, which may represent sequelae associated with an atypical or viral pneumonia. 5. Mild posteromedial right upper lobe atelectasis and/or infiltrate. 6. Small hiatal hernia. Electronically Signed   By: Suzen Dials M.D.   On: 12/24/2023 22:58   DG Chest Portable 1 View Result Date: 12/24/2023 CLINICAL DATA:  Shortness of breath and left-sided chest pain EXAM: PORTABLE CHEST 1 VIEW COMPARISON:  08/11/2023 FINDINGS: Interstitial coarsening and patchy basilar airspace opacities bilaterally. Additional subpleural irregular opacity along the right upper lung corresponds to the radiotracer avid nodule seen on PET/CT 07/31/2023. No pleural effusion or pneumothorax. No displaced rib fractures. Stable cardiomediastinal silhouette. IMPRESSION: 1. Interstitial coarsening and patchy basilar airspace opacities bilaterally concerning for infection. 2. Subpleural irregular opacity along the right upper lung corresponds to the radiotracer avid nodule seen on PET/CT 07/31/2023. Electronically Signed   By: Norman Gatlin M.D.   On: 12/24/2023 21:44      ASSESSMENT/PLAN   58 year old white female seen today for acute COPD exacerbation with underlying pulmonary fibrosis with right upper lobe adenocarcinoma of the right lung status post radiation therapy admitted for increased work of breathing shortness of breath likely related to acute pneumonitis and pneumonia leading to COPD exacerbation  SEVERE COPD EXACERBATION -continue IV steroids as prescribed -start NEB THERAPY as prescribed -wean fio2 as needed and tolerated Check COVID  Severe ACUTE Hypoxic Failure -Wean Fio2  - Ensure adequate pulmonary hygiene    Patient  satisfied with Plan of action and management.  All questions answered    I spent a total of 80 minutes dedicated to the care of this patient on the date of this encounter to include pre-visit review of records, face-to-face time with the patient discussing conditions above, post visit ordering of testing, clinical documentation with the electronic health record, making appropriate referrals as documented, and communicating necessary information to the patient's healthcare team.    The Patient requires high complexity decision making for assessment and support, frequent evaluation and titration of therapies, application of advanced monitoring technologies and extensive interpretation of multiple databases.  Patient satisfied with Plan of action and management. All questions answered    Nickolas Alm Cellar, M.D.  [facility]  Medical Director [facility] Medical Director [facility] Cardio-Pulmonary Department

## 2023-12-28 NOTE — Discharge Instructions (Signed)
 Food Resources  Agency Name: [facility] Agency Address: [address], [place], [place] [address] Phone: [phone] Website: [email or web address] Service(s) Offered: Housing services, self-sufficiency, congregate meal program, weatherization program, Event organiser program, emergency food assistance,  housing counseling, home ownership program, wheels - to work program.  [employer] free for 60 and older at various locations from [employer], Monday-Friday:  [employer], [address] [place], [phone] -[facility], [address] [place] [phone]  -[facility], [address] [place] [phone]  -[address], [place], [phone]  Agency Name: [facility] Address: [phone] [address], [place], [place] [address] Phone: [phone] Website: [email or web address] Service(s) Offered: Home delivered hot, frozen, and emergency  meals. Grocery assistance program which matches  volunteers one-on-one with seniors unable to grocery shop  for themselves. Must be 60 years and older; less than 20  hours of in-home aide service, limited or no driving ability;  live alone or with someone with a disability; live in  [place].  Agency Name: [employer] [facility] [employer]) Address: [address], [place], [place] [address] Phone: [phone] Service(s) Offered: Food is served to shut-ins, homeless, elderly, and low income people in the community every Saturday (11:30 am-12:30 pm) and Sunday (12:30 pm-1:30pm). Volunteers also offer help and encouragement in seeking employment,  and spiritual guidance.  Agency Name: [employer] Address: [address] [place], [place] [address] Phone: [phone] Service(s) Offered: Child support services; child welfare services; food stamps; Medicaid; work first family assistance; and aid  with fuel,  rent, food and medicine.  Agency Name: [employer] Address: [address] [place], [place] Phone: [phone] Website: [email or web address].[email or web address] Services Offered: Monday 10:00am-12:00, 8:00pm-9:00pm, and Friday 10:00am-12:00.  Agency Name: [employer] Address: [address] [place], [place] [address] Phone: [phone] Website: [email or web address] Service(s) Offered: Serves weekday meals, open from 11:30 am- 1:00 pm., and 6:30-7:30pm, Monday-Wednesday-Friday distributes food 3:30-6pm, Monday-Wednesday-Friday.  Agency Name: [facility] Address: [address] [place], [place] Phone: [phone] Website: [email or web address] Services Offered: Distributes food the 4th Saturday of the month, starting at 8:00 am  Agency Name: [facility] Address: [phone] [address], [place], [place] [address] Phone: [phone] Website: [email or web address] Service(s) Offered: [employer] of life, weekly food pantry. Open Wednesdays from 10:00am-noon.  Agency Name: [facility] [employer] Address: [address] [place], [place], [place] Phone: [phone] Services Offered: Distributes food 9am-1pm, Monday-Thursday. Call for details.  Agency Name: [facility] Address: [address], [place], [place] [address] Phone: [phone] Website: [email or web address] Service(s) Offered: Games developer. Call for assistance.  Agency Name: [facility] Address: [address] [place], [place] [address] Phone: [phone] Service Offered: Emergency Food Pantry. Call for appointment.  Agency Name: [facility] Address: [address] [place], [place] [address] Phone: [phone] Website: [email or web address] Services Offered: Games developer. Call for details  Agency Name: [facility] Address: [address] [place], [place] Phone:  [phone] Website: [email or web address] Service(s) Offered: Emergency Food Pantry. Call for details.  Agency Name: Holiday representative Address: [address], [place], [place] [address] Phone: [phone] or [phone] Website: [email or web address] Service(s) Offered: Distribute food 9am-11:30 am, Tuesday-Friday, and 1-3:30pm, Monday-Friday. Food pantry Monday-Friday 1pm-3pm, fresh items, Mon.-Wed.-Fri.  Agency Name: [facility] Address: [address] [place], [place] [address] Phone: [phone] Website: [email or web address] Services Offered: Distribute food Tues and Sats from 9:00am-noon.  Closed 1st Saturday of each month. Call for details  Agency Name: [employer] Address: [place] [facility] [address], [place] [address] Phone: [phone]  Services Offered: Delivers meals every Thursday

## 2023-12-28 NOTE — Progress Notes (Signed)
 Progress Note    Joy Ball  FMW:[id number] DOB: 09/08/1965  DOA: 12/24/2023 PCP: Edman Marsa PARAS, DO      Brief Narrative:    Medical records reviewed and are as summarized below:  Joy Ball is a 58 y.o. female  with medical history significant for COPD, pulmonary fibrosis, GERD, and adenocarcinoma of the right lung s/p radiation therapy and chronic hypokalemia who presented to the ED for evaluation of cough, left-sided chest pain and shortness of breath.  When EMS arrived, she was hypoxic with oxygen saturation in the 70s.      Assessment/Plan:   Principal Problem:   Acute respiratory failure with hypoxia (HCC) Active Problems:   Shortness of breath   Pneumonia due to infectious organism    Body mass index is 22.46 kg/m.   Community-acquired pneumonia with left-sided pleuritic chest pain, cough and shortness of breath, immunocompromised patient: Respiratory panel was negative.  MRSA screen by PCR was negative.  No growth on blood cultures thus far.  Strep pneumo urine antigen negative.  Legionella pneumophila urine antigen was not done. Continue IV ceftriaxone  and azithromycin .  Start oral Levaquin .   Acute hypoxic respiratory failure: She has been on 4  to 4.5 L oxygen via nasal cannula.  Unable to wean down oxygen.  She will likely need home oxygen.   Hypotension: BP has improved and stable.   Hypokalemia: Improved.  Patient reports history of recurrent hypokalemia.   Probable acute exacerbation of COPD (extensive emphysema on CT chest), pulmonary fibrosis: Start IV steroids.  Continue bronchodilators.  Consulted Dr. Isaiah, pulmonologist, to assist with management.  Outpatient follow-up with interstitial lung disease clinic for further management.   Primary adenocarcinoma of the right lung, stage Ia, s/p radiation therapy (completed in April 2025).  Outpatient follow-up with radiation oncologist.   Macrocytosis: Vitamin B12 322 and folate  5.7 (slightly below normal)   Comorbidities include CAD, anxiety, tobacco use disorder   Diet Order             Diet regular Room service appropriate? Yes; Fluid consistency: Thin  Diet effective now                            Consultants: Pulmonologist  Procedures: None    Medications:    budesonide  (PULMICORT ) nebulizer solution  0.5 mg Nebulization BID   buPROPion   150 mg Oral BID   dextromethorphan -guaiFENesin   1 tablet Oral BID   enoxaparin  (LOVENOX ) injection  40 mg Subcutaneous Q24H   gabapentin   300 mg Oral QHS   guaiFENesin   600 mg Oral BID   ipratropium-albuterol   3 mL Nebulization Q4H   levofloxacin   750 mg Oral Daily   lidocaine   1 patch Transdermal Q24H   loratadine   10 mg Oral Daily   methylPREDNISolone  (SOLU-MEDROL ) injection  40 mg Intravenous BID   naproxen   500 mg Oral BID WC   pantoprazole   40 mg Oral Daily   Continuous Infusions:     Anti-infectives (From admission, onward)    Start     Dose/Rate Route Frequency Ordered Stop   12/28/23 1400  levofloxacin  (LEVAQUIN ) tablet 750 mg        750 mg Oral Daily 12/28/23 0745 12/31/23 0959   12/25/23 2300  cefTRIAXone  (ROCEPHIN ) 1 g in sodium chloride  0.9 % 100 mL IVPB  Status:  Discontinued        1 g 200 mL/hr over 30 Minutes Intravenous  Every 24 hours 12/25/23 0741 12/28/23 0745   12/25/23 2200  azithromycin  (ZITHROMAX ) 500 mg in sodium chloride  0.9 % 250 mL IVPB  Status:  Discontinued        500 mg 250 mL/hr over 60 Minutes Intravenous Every 24 hours 12/25/23 0741 12/28/23 1025   12/24/23 2300  cefTRIAXone  (ROCEPHIN ) 1 g in sodium chloride  0.9 % 100 mL IVPB        1 g 200 mL/hr over 30 Minutes Intravenous  Once 12/24/23 2245 12/24/23 2345   12/24/23 2300  azithromycin  (ZITHROMAX ) 500 mg in sodium chloride  0.9 % 250 mL IVPB        500 mg 250 mL/hr over 60 Minutes Intravenous  Once 12/24/23 2245 12/25/23 0110              Family Communication/Anticipated D/C date and  plan/Code Status   DVT prophylaxis: enoxaparin  (LOVENOX ) injection 40 mg Start: 12/25/23 0800     Code Status: Full Code  Family Communication: Plan discussed with Prentice, boyfriend, at the bedside  Disposition Plan: Plan to discharge home   Status is: Inpatient Remains inpatient appropriate because: Community-acquired pneumonia with acute hypoxic respiratory failure       Subjective:   Interval events noted.  She complains of cough and shortness of breath.  Shortness of breath is worse with exertion and associated with decreased oxygen saturation.  She does not think she is improving.  Fred, significant other, was at the bedside.    Objective:    Vitals:   12/27/23 1444 12/27/23 2023 12/28/23 0408 12/28/23 1124  BP: 121/79 117/75 (!) 114/90   Pulse: 92 94 89   Resp: 18 18 14    Temp: 98.1 F (36.7 C) 98.3 F (36.8 C) 98.5 F (36.9 C)   TempSrc: Oral     SpO2: 90% 91% 90% 97%  Weight:      Height:       No data found.   Intake/Output Summary (Last 24 hours) at 12/28/2023 1356 Last data filed at 12/27/2023 1500 Gross per 24 hour  Intake 240 ml  Output --  Net 240 ml   Filed Weights   12/24/23 2107 12/25/23 0043  Weight: 52.2 kg 52.2 kg    Exam:  GEN: NAD SKIN: Warm and dry EYES: No pallor or icterus ENT: MMM CV: RRR PULM: Bibasilar rales.  No wheezing ABD: soft, ND, NT, +BS CNS: AAO x 3, non focal EXT: No edema or tenderness       Data Reviewed:   I have personally reviewed following labs and imaging studies:  Labs: Labs show the following:   Basic Metabolic Panel: Recent Labs  Lab 12/24/23 2147 12/25/23 0659 12/26/23 0437  NA 139 141 137  K 3.0* 4.1 3.6  CL 107 110 107  CO2 20* 22 21*  GLUCOSE 83 91 92  BUN 8 11 11   CREATININE 0.58 0.60 0.51  CALCIUM 8.6* 8.0* 8.1*  MG  --  2.0  --    GFR Estimated Creatinine Clearance: 56.4 mL/min (by C-G formula based on SCr of 0.51 mg/dL). Liver Function Tests: Recent Labs  Lab  12/25/23 0659  AST 31  ALT 19  ALKPHOS 157*  BILITOT 0.5  PROT 5.0*  ALBUMIN 2.4*   No results for input(s): LIPASE, AMYLASE in the last 168 hours. No results for input(s): AMMONIA in the last 168 hours. Coagulation profile No results for input(s): INR, PROTIME in the last 168 hours.  CBC: Recent Labs  Lab 12/24/23 2147  12/25/23 0506 12/26/23 0437  WBC 10.5 11.0* 8.3  NEUTROABS 7.4  --   --   HGB 14.4 13.8 13.4  HCT 42.0 42.1 39.4  MCV 108.2* 113.2* 109.1*  PLT 410* 350 322   Cardiac Enzymes: No results for input(s): CKTOTAL, CKMB, CKMBINDEX, TROPONINI in the last 168 hours. BNP (last 3 results) No results for input(s): PROBNP in the last 8760 hours. CBG: Recent Labs  Lab 12/27/23 1553  GLUCAP 89   D-Dimer: No results for input(s): DDIMER in the last 72 hours. Hgb A1c: No results for input(s): HGBA1C in the last 72 hours. Lipid Profile: No results for input(s): CHOL, HDL, LDLCALC, TRIG, CHOLHDL, LDLDIRECT in the last 72 hours. Thyroid  function studies: No results for input(s): TSH, T4TOTAL, T3FREE, THYROIDAB in the last 72 hours.  Invalid input(s): FREET3 Anemia work up: Recent Labs    12/25/23 1439  VITAMINB12 322   Sepsis Labs: Recent Labs  Lab 12/24/23 2147 12/25/23 0053 12/25/23 0506 12/26/23 0437  PROCALCITON  --  0.16  --   --   WBC 10.5  --  11.0* 8.3    Microbiology Recent Results (from the past 240 hours)  Blood culture (routine x 2)     Status: None (Preliminary result)   Collection Time: 12/24/23 11:00 PM   Specimen: BLOOD  Result Value Ref Range Status   Specimen Description BLOOD BLOOD LEFT ARM  Final   Special Requests   Final    BOTTLES DRAWN AEROBIC AND ANAEROBIC Blood Culture adequate volume   Culture   Final    NO GROWTH 4 DAYS Performed at [facility], [address] [place], [place] [address]    Report Status PENDING  Incomplete  Blood culture (routine x 2)      Status: None (Preliminary result)   Collection Time: 12/24/23 11:20 PM   Specimen: BLOOD  Result Value Ref Range Status   Specimen Description BLOOD BLOOD RIGHT HAND  Final   Special Requests   Final    BOTTLES DRAWN AEROBIC AND ANAEROBIC Blood Culture adequate volume   Culture   Final    NO GROWTH 4 DAYS Performed at [facility], [address] [place], [place] [address]    Report Status PENDING  Incomplete  Respiratory (~20 pathogens) panel by PCR     Status: None   Collection Time: 12/25/23  1:09 AM   Specimen: Nasal Mucosa; Respiratory  Result Value Ref Range Status   Adenovirus NOT DETECTED NOT DETECTED Final   Coronavirus 229E NOT DETECTED NOT DETECTED Final    Comment: (NOTE) The Coronavirus on the Respiratory Panel, DOES NOT test for the novel  Coronavirus (2019 nCoV)    Coronavirus HKU1 NOT DETECTED NOT DETECTED Final   Coronavirus NL63 NOT DETECTED NOT DETECTED Final   Coronavirus OC43 NOT DETECTED NOT DETECTED Final   Metapneumovirus NOT DETECTED NOT DETECTED Final   Rhinovirus / Enterovirus NOT DETECTED NOT DETECTED Final   Influenza A NOT DETECTED NOT DETECTED Final   Influenza B NOT DETECTED NOT DETECTED Final   Parainfluenza Virus 1 NOT DETECTED NOT DETECTED Final   Parainfluenza Virus 2 NOT DETECTED NOT DETECTED Final   Parainfluenza Virus 3 NOT DETECTED NOT DETECTED Final   Parainfluenza Virus 4 NOT DETECTED NOT DETECTED Final   Respiratory Syncytial Virus NOT DETECTED NOT DETECTED Final   Bordetella pertussis NOT DETECTED NOT DETECTED Final   Bordetella Parapertussis NOT DETECTED NOT DETECTED Final   Chlamydophila pneumoniae NOT DETECTED NOT DETECTED Final  Mycoplasma pneumoniae NOT DETECTED NOT DETECTED Final    Comment: Performed at [facility], [address], [place], [place] [address]  MRSA Next Gen by PCR, Nasal     Status: None   Collection Time: 12/25/23  1:09 AM   Specimen: Nasal Mucosa; Nasal Swab  Result Value Ref Range Status    MRSA by PCR Next Gen NOT DETECTED NOT DETECTED Final    Comment: (NOTE) The GeneXpert MRSA Assay (FDA approved for NASAL specimens only), is one component of a comprehensive MRSA colonization surveillance program. It is not intended to diagnose MRSA infection nor to guide or monitor treatment for MRSA infections. Test performance is not FDA approved in patients less than 40 years old. Performed at [facility], [address] [place], [place] [address]     Procedures and diagnostic studies:  No results found.              LOS: 4 days   Arliss Frisina  [facility]   Pager on [email or web address]. If 7PM-7AM, please contact night-coverage at [email or web address]     12/28/2023, 1:56 PM

## 2023-12-28 NOTE — Plan of Care (Signed)
  Problem: Education: Goal: Knowledge of General Education information will improve Description: Including pain rating scale, medication(s)/side effects and non-pharmacologic comfort measures Outcome: Progressing   Problem: Clinical Measurements: Goal: Ability to maintain clinical measurements within normal limits will improve Outcome: Progressing Goal: Will remain free from infection Outcome: Progressing Goal: Diagnostic test results will improve Outcome: Progressing Goal: Respiratory complications will improve Outcome: Progressing Goal: Cardiovascular complication will be avoided Outcome: Progressing   Problem: Activity: Goal: Risk for activity intolerance will decrease Outcome: Progressing   Problem: Nutrition: Goal: Adequate nutrition will be maintained Outcome: Progressing   Problem: Coping: Goal: Level of anxiety will decrease Outcome: Progressing   Problem: Elimination: Goal: Will not experience complications related to bowel motility Outcome: Progressing Goal: Will not experience complications related to urinary retention Outcome: Progressing   Problem: Pain Managment: Goal: General experience of comfort will improve and/or be controlled Outcome: Progressing   Problem: Safety: Goal: Ability to remain free from injury will improve Outcome: Progressing   Problem: Activity: Goal: Ability to tolerate increased activity will improve Outcome: Progressing   Problem: Clinical Measurements: Goal: Ability to maintain a body temperature in the normal range will improve Outcome: Progressing   Problem: Respiratory: Goal: Ability to maintain adequate ventilation will improve Outcome: Progressing Goal: Ability to maintain a clear airway will improve Outcome: Progressing   Problem: Respiratory: Goal: Ability to maintain adequate ventilation will improve Outcome: Progressing Goal: Ability to maintain a clear airway will improve Outcome: Progressing

## 2023-12-28 NOTE — TOC CM/SW Note (Signed)
 Transition of Care [facility]) - Inpatient Brief Assessment   Patient Details  Name: Joy Ball MRN: [id number] Date of Birth: 01-Feb-1966  [facility] CM/SW Contact:    Lauraine JAYSON Carpen, LCSW Phone Number: 12/28/2023, 4:21 PM   Clinical Narrative: CSW reviewed chart. Patient is currently on 4 L acute oxygen. Will follow for this potential discharge need. SDOH flag for food insecurity. Resources added to AVS.  Transition of Care Asessment: Insurance and Status: Insurance coverage has been reviewed Patient has primary care physician: Yes Home environment has been reviewed: Single family home Prior level of function:: Not documented Prior/Current Home Services: No current home services Social Drivers of Health Review: SDOH reviewed interventions complete Readmission risk has been reviewed: Yes Transition of care needs: transition of care needs identified, TOC will continue to follow

## 2023-12-29 ENCOUNTER — Inpatient Hospital Stay

## 2023-12-29 DIAGNOSIS — J9601 Acute respiratory failure with hypoxia: Secondary | ICD-10-CM | POA: Diagnosis not present

## 2023-12-29 LAB — CULTURE, BLOOD (ROUTINE X 2)
Culture: NO GROWTH
Culture: NO GROWTH
Special Requests: ADEQUATE
Special Requests: ADEQUATE

## 2023-12-29 MED ORDER — LORAZEPAM 2 MG/ML IJ SOLN
0.5000 mg | Freq: Once | INTRAMUSCULAR | Status: AC
  Administered 2023-12-29: 0.5 mg via INTRAVENOUS
  Filled 2023-12-29: qty 1

## 2023-12-29 MED ORDER — ALPRAZOLAM 0.25 MG PO TABS
0.2500 mg | ORAL_TABLET | Freq: Two times a day (BID) | ORAL | Status: AC | PRN
  Administered 2023-12-29 – 2023-12-30 (×2): 0.25 mg via ORAL
  Filled 2023-12-29 (×2): qty 1

## 2023-12-29 NOTE — Significant Event (Signed)
 Rapid Response Event Note   Reason for Call :  Shortness of breath, hypoxia; Rapid response canceled as soon as rapid response team arrived  Initial Focused Assessment:  Patient in bed surrounded by [facility] staff. Patient alert and able to speak in short sentences. Patient's oxygen saturations were in low 80s when rapid response was called but were up to 89% when rapid response team arrived. Lungs very diminished to auscultation, patient tachypneic with shallow breathing in the upper 20s. HR 106 regular, BP 112/72 MAP 85.  Interventions:  RT gave patient breathing treatments. Oxygen saturations sustained in mid 90s during treatments.  Plan of Care:  Patient to remain on 2C for now. RT to place patient on high flow nasal cannula when patient is done with breathing treatments.  Event Summary:   MD Notified: canceled Call Time: 07:30 Arrival Time: 07:32 End Time: 07:38  Raigan Baria, LAURAINE NORRIS, RN

## 2023-12-29 NOTE — Plan of Care (Signed)
  Problem: Education: Goal: Knowledge of General Education information will improve Description: Including pain rating scale, medication(s)/side effects and non-pharmacologic comfort measures Outcome: Progressing   Problem: Health Behavior/Discharge Planning: Goal: Ability to manage health-related needs will improve Outcome: Progressing   Problem: Activity: Goal: Risk for activity intolerance will decrease Outcome: Progressing   Problem: Nutrition: Goal: Adequate nutrition will be maintained Outcome: Progressing   Problem: Respiratory: Goal: Ability to maintain adequate ventilation will improve Outcome: Not Progressing

## 2023-12-29 NOTE — Progress Notes (Signed)
   12/29/23 1428  Assess: MEWS Score  Temp 98.6 F (37 C)  BP 125/81  MAP (mmHg) 95  Pulse Rate (!) 118  ECG Heart Rate (!) 117  Resp 14  Level of Consciousness Alert  SpO2 (!) 88 %  O2 Device HHFNC  Patient Activity (if Appropriate) In bed  O2 Flow Rate (L/min) 45 L/min  FiO2 (%) 51 %  Assess: MEWS Score  MEWS Temp 0  MEWS Systolic 0  MEWS Pulse 2  MEWS RR 0  MEWS LOC 0  MEWS Score 2  MEWS Score Color Yellow  Assess: if the MEWS score is Yellow or Red  Were vital signs accurate and taken at a resting state? Yes  Does the patient meet 2 or more of the SIRS criteria? No  MEWS guidelines implemented  Yes, yellow  Treat  MEWS Interventions Considered administering scheduled or prn medications/treatments as ordered  Take Vital Signs  Increase Vital Sign Frequency  Yellow: Q2hr x1, continue Q4hrs until patient remains green for 12hrs  Escalate  MEWS: Escalate Yellow: Discuss with charge nurse and consider notifying provider and/or RRT  Notify: Charge Nurse/RN  Name of Charge Nurse/RN Notified John, RN  Provider Notification  Provider Name/Title Dr. Jens  Date Provider Notified 12/29/23  Time Provider Notified 1434  Method of Notification Page  Notification Reason Other (Comment) (Yellow MEWS)  Provider response Other (Comment)  Date of Provider Response 12/29/23  Time of Provider Response  (Dr. Jens saw secure chat but did not reply.)  Assess: SIRS CRITERIA  SIRS Temperature  0  SIRS Respirations  0  SIRS Pulse 1  SIRS WBC 0  SIRS Score Sum  1   Patient arrived from [facility] alert with no distress noted. Dyspneic with exertion and desaturates with movement in bed including coughing. Patient Yellow MEWS upon transfer to 2A.

## 2023-12-29 NOTE — Progress Notes (Addendum)
 Progress Note    Joy Ball  FMW:[id number] DOB: 1965-07-01  DOA: 12/24/2023 PCP: Edman Marsa PARAS, DO      Brief Narrative:    Medical records reviewed and are as summarized below:  Joy Ball is a 58 y.o. female  with medical history significant for COPD, pulmonary fibrosis, GERD, and adenocarcinoma of the right lung s/p radiation therapy and chronic hypokalemia who presented to the ED for evaluation of cough, left-sided chest pain and shortness of breath.  When EMS arrived, she was hypoxic with oxygen saturation in the 70s.      Assessment/Plan:   Principal Problem:   Acute respiratory failure with hypoxia (HCC) Active Problems:   Shortness of breath   Pneumonia due to infectious organism    Body mass index is 22.46 kg/m.   Community-acquired pneumonia with left-sided pleuritic chest pain, cough and shortness of breath, immunocompromised patient: Respiratory panel was negative.  MRSA screen by PCR was negative.  No growth on blood cultures thus far.  Strep pneumo urine antigen negative.  Legionella pneumophila urine antigen was not done.  SARS-CoV-2 test was negative. Continue oral Levaquin    Acute hypoxic respiratory failure: Worsening respiratory failure.  She is now requiring up to 7 L/min oxygen via HFNC.  Recommended oxygenation via heated humidified high flow nasal cannula.  [name], RN and respiratory therapist have been notified.  Taper down oxygen as able. Repeat chest x-ray.   Hypotension: BP has improved and stable.   Hypokalemia: Improved.  Patient reports history of recurrent hypokalemia.   Probable acute exacerbation of COPD (extensive emphysema on CT chest), pulmonary fibrosis: Continue IV Solu-Medrol  and bronchodilators.  Follow-up with pulmonologist.  Outpatient follow-up with interstitial lung disease clinic for further management.   Primary adenocarcinoma of the right lung, stage Ia, s/p radiation therapy (completed in April  2025).  Outpatient follow-up with radiation oncologist.   Anxiety: She was given Ativan  0.5 mg x 1 dose this morning.  Start low dose Xanax  as needed for anxiety   Macrocytosis: Vitamin B12 322 and folate 5.7 (slightly below normal)   Comorbidities include CAD, anxiety, tobacco use disorder   Transfer from medical telemetry unit to progressive care unit for higher level of care.   Diet Order             Diet regular Room service appropriate? Yes; Fluid consistency: Thin  Diet effective now                            Consultants: Pulmonologist  Procedures: None    Medications:    budesonide  (PULMICORT ) nebulizer solution  0.5 mg Nebulization BID   buPROPion   150 mg Oral BID   dextromethorphan -guaiFENesin   1 tablet Oral BID   enoxaparin  (LOVENOX ) injection  40 mg Subcutaneous Q24H   gabapentin   300 mg Oral QHS   guaiFENesin   600 mg Oral BID   ipratropium-albuterol   3 mL Nebulization Q6H   levofloxacin   750 mg Oral Daily   lidocaine   1 patch Transdermal Q24H   loratadine   10 mg Oral Daily   methylPREDNISolone  (SOLU-MEDROL ) injection  40 mg Intravenous BID   naproxen   500 mg Oral BID WC   pantoprazole   40 mg Oral Daily   Continuous Infusions:     Anti-infectives (From admission, onward)    Start     Dose/Rate Route Frequency Ordered Stop   12/28/23 1400  levofloxacin  (LEVAQUIN ) tablet 750 mg  750 mg Oral Daily 12/28/23 0745 12/31/23 0959   12/25/23 2300  cefTRIAXone  (ROCEPHIN ) 1 g in sodium chloride  0.9 % 100 mL IVPB  Status:  Discontinued        1 g 200 mL/hr over 30 Minutes Intravenous Every 24 hours 12/25/23 0741 12/28/23 0745   12/25/23 2200  azithromycin  (ZITHROMAX ) 500 mg in sodium chloride  0.9 % 250 mL IVPB  Status:  Discontinued        500 mg 250 mL/hr over 60 Minutes Intravenous Every 24 hours 12/25/23 0741 12/28/23 1025   12/24/23 2300  cefTRIAXone  (ROCEPHIN ) 1 g in sodium chloride  0.9 % 100 mL IVPB        1 g 200 mL/hr over 30  Minutes Intravenous  Once 12/24/23 2245 12/24/23 2345   12/24/23 2300  azithromycin  (ZITHROMAX ) 500 mg in sodium chloride  0.9 % 250 mL IVPB        500 mg 250 mL/hr over 60 Minutes Intravenous  Once 12/24/23 2245 12/25/23 0110              Family Communication/Anticipated D/C date and plan/Code Status   DVT prophylaxis: enoxaparin  (LOVENOX ) injection 40 mg Start: 12/25/23 0800     Code Status: Full Code  Family Communication: None  Disposition Plan: Plan to discharge home   Status is: Inpatient Remains inpatient appropriate because: Community-acquired pneumonia with acute hypoxic respiratory failure       Subjective:   Interval events noted.  She complains of difficulty breathing with minimal exertion.  She still has a cough here and there.  She complains of anxiety.  Oxygen requirement increased from 6 to 7 L this morning.  Objective:    Vitals:   12/29/23 0214 12/29/23 0409 12/29/23 0730 12/29/23 0735  BP:  112/77 112/72   Pulse:  100 (!) 105   Resp:  (!) 24 (!) 25   Temp:  98.4 F (36.9 C) 97.7 F (36.5 C)   TempSrc:   Oral   SpO2: 90% 91% (!) 87% (!) 89%  Weight:      Height:       No data found.  No intake or output data in the 24 hours ending 12/29/23 1141  Filed Weights   12/24/23 2107 12/25/23 0043  Weight: 52.2 kg 52.2 kg    Exam:   GEN: NAD SKIN: Warm and dry EYES: Anicteric, n pallor ENT: MMM CV: RRR PULM: Bibasilar rales.  No wheezing. ABD: soft, ND, NT, +BS CNS: AAO x 3, non focal EXT: No edema or tenderness       Data Reviewed:   I have personally reviewed following labs and imaging studies:  Labs: Labs show the following:   Basic Metabolic Panel: Recent Labs  Lab 12/24/23 2147 12/25/23 0659 12/26/23 0437  NA 139 141 137  K 3.0* 4.1 3.6  CL 107 110 107  CO2 20* 22 21*  GLUCOSE 83 91 92  BUN 8 11 11   CREATININE 0.58 0.60 0.51  CALCIUM 8.6* 8.0* 8.1*  MG  --  2.0  --    GFR Estimated Creatinine  Clearance: 56.4 mL/min (by C-G formula based on SCr of 0.51 mg/dL). Liver Function Tests: Recent Labs  Lab 12/25/23 0659  AST 31  ALT 19  ALKPHOS 157*  BILITOT 0.5  PROT 5.0*  ALBUMIN 2.4*   No results for input(s): LIPASE, AMYLASE in the last 168 hours. No results for input(s): AMMONIA in the last 168 hours. Coagulation profile No results for input(s): INR, PROTIME in  the last 168 hours.  CBC: Recent Labs  Lab 12/24/23 2147 12/25/23 0506 12/26/23 0437  WBC 10.5 11.0* 8.3  NEUTROABS 7.4  --   --   HGB 14.4 13.8 13.4  HCT 42.0 42.1 39.4  MCV 108.2* 113.2* 109.1*  PLT 410* 350 322   Cardiac Enzymes: No results for input(s): CKTOTAL, CKMB, CKMBINDEX, TROPONINI in the last 168 hours. BNP (last 3 results) No results for input(s): PROBNP in the last 8760 hours. CBG: Recent Labs  Lab 12/27/23 1553  GLUCAP 89   D-Dimer: No results for input(s): DDIMER in the last 72 hours. Hgb A1c: No results for input(s): HGBA1C in the last 72 hours. Lipid Profile: No results for input(s): CHOL, HDL, LDLCALC, TRIG, CHOLHDL, LDLDIRECT in the last 72 hours. Thyroid  function studies: No results for input(s): TSH, T4TOTAL, T3FREE, THYROIDAB in the last 72 hours.  Invalid input(s): FREET3 Anemia work up: No results for input(s): VITAMINB12, FOLATE, FERRITIN, TIBC, IRON, RETICCTPCT in the last 72 hours.  Sepsis Labs: Recent Labs  Lab 12/24/23 2147 12/25/23 0053 12/25/23 0506 12/26/23 0437  PROCALCITON  --  0.16  --   --   WBC 10.5  --  11.0* 8.3    Microbiology Recent Results (from the past 240 hours)  Blood culture (routine x 2)     Status: None   Collection Time: 12/24/23 11:00 PM   Specimen: BLOOD  Result Value Ref Range Status   Specimen Description BLOOD BLOOD LEFT ARM  Final   Special Requests   Final    BOTTLES DRAWN AEROBIC AND ANAEROBIC Blood Culture adequate volume   Culture   Final    NO GROWTH 5  DAYS Performed at [facility], [address], [place], [place] [address]    Report Status 12/29/2023 FINAL  Final  Blood culture (routine x 2)     Status: None   Collection Time: 12/24/23 11:20 PM   Specimen: BLOOD  Result Value Ref Range Status   Specimen Description BLOOD BLOOD RIGHT HAND  Final   Special Requests   Final    BOTTLES DRAWN AEROBIC AND ANAEROBIC Blood Culture adequate volume   Culture   Final    NO GROWTH 5 DAYS Performed at [facility], [address], [place], [place] [address]    Report Status 12/29/2023 FINAL  Final  Respiratory (~20 pathogens) panel by PCR     Status: None   Collection Time: 12/25/23  1:09 AM   Specimen: Nasal Mucosa; Respiratory  Result Value Ref Range Status   Adenovirus NOT DETECTED NOT DETECTED Final   Coronavirus 229E NOT DETECTED NOT DETECTED Final    Comment: (NOTE) The Coronavirus on the Respiratory Panel, DOES NOT test for the novel  Coronavirus (2019 nCoV)    Coronavirus HKU1 NOT DETECTED NOT DETECTED Final   Coronavirus NL63 NOT DETECTED NOT DETECTED Final   Coronavirus OC43 NOT DETECTED NOT DETECTED Final   Metapneumovirus NOT DETECTED NOT DETECTED Final   Rhinovirus / Enterovirus NOT DETECTED NOT DETECTED Final   Influenza A NOT DETECTED NOT DETECTED Final   Influenza B NOT DETECTED NOT DETECTED Final   Parainfluenza Virus 1 NOT DETECTED NOT DETECTED Final   Parainfluenza Virus 2 NOT DETECTED NOT DETECTED Final   Parainfluenza Virus 3 NOT DETECTED NOT DETECTED Final   Parainfluenza Virus 4 NOT DETECTED NOT DETECTED Final   Respiratory Syncytial Virus NOT DETECTED NOT DETECTED Final   Bordetella pertussis NOT DETECTED NOT DETECTED Final   Bordetella Parapertussis  NOT DETECTED NOT DETECTED Final   Chlamydophila pneumoniae NOT DETECTED NOT DETECTED Final   Mycoplasma pneumoniae NOT DETECTED NOT DETECTED Final    Comment: Performed at [facility], [address] [place], [place] [address]  MRSA  Next Gen by PCR, Nasal     Status: None   Collection Time: 12/25/23  1:09 AM   Specimen: Nasal Mucosa; Nasal Swab  Result Value Ref Range Status   MRSA by PCR Next Gen NOT DETECTED NOT DETECTED Final    Comment: (NOTE) The GeneXpert MRSA Assay (FDA approved for NASAL specimens only), is one component of a comprehensive MRSA colonization surveillance program. It is not intended to diagnose MRSA infection nor to guide or monitor treatment for MRSA infections. Test performance is not FDA approved in patients less than 2 years old. Performed at [facility], [address] [place], [place] [address]   SARS Coronavirus 2 by RT PCR (hospital order, performed in [facility] hospital lab) *cepheid single result test* Anterior Nasal Swab     Status: None   Collection Time: 12/28/23  1:05 PM   Specimen: Anterior Nasal Swab  Result Value Ref Range Status   SARS Coronavirus 2 by RT PCR NEGATIVE NEGATIVE Final    Comment: (NOTE) SARS-CoV-2 target nucleic acids are NOT DETECTED.  The SARS-CoV-2 RNA is generally detectable in upper and lower respiratory specimens during the acute phase of infection. The lowest concentration of SARS-CoV-2 viral copies this assay can detect is 250 copies / mL. A negative result does not preclude SARS-CoV-2 infection and should not be used as the sole basis for treatment or other patient management decisions.  A negative result may occur with improper specimen collection / handling, submission of specimen other than nasopharyngeal swab, presence of viral mutation(s) within the areas targeted by this assay, and inadequate number of viral copies (<250 copies / mL). A negative result must be combined with clinical observations, patient history, and epidemiological information.  Fact Sheet for Patients:   RoadLapTop.co.za  Fact Sheet for Healthcare Providers: http://kim-miller.com/  This test is not yet  approved or  cleared by the [place]  FDA and has been authorized for detection and/or diagnosis of SARS-CoV-2 by FDA under an Emergency Use Authorization (EUA).  This EUA will remain in effect (meaning this test can be used) for the duration of the COVID-19 declaration under Section 564(b)(1) of the Act, 21 U.S.C. section 360bbb-3(b)(1), unless the authorization is terminated or revoked sooner.  Performed at [facility], [address] [place], [place] [address]     Procedures and diagnostic studies:  No results found.              LOS: 5 days   Mamie Diiorio  [facility]   Pager on [email or web address]. If 7PM-7AM, please contact night-coverage at [email or web address]     12/29/2023, 11:41 AM

## 2023-12-30 DIAGNOSIS — J9601 Acute respiratory failure with hypoxia: Secondary | ICD-10-CM | POA: Diagnosis not present

## 2023-12-30 LAB — COMPREHENSIVE METABOLIC PANEL WITH GFR
ALT: 18 U/L (ref 0–44)
AST: 29 U/L (ref 15–41)
Albumin: 2.8 g/dL — ABNORMAL LOW (ref 3.5–5.0)
Alkaline Phosphatase: 163 U/L — ABNORMAL HIGH (ref 38–126)
Anion gap: 10 (ref 5–15)
BUN: 10 mg/dL (ref 6–20)
CO2: 19 mmol/L — ABNORMAL LOW (ref 22–32)
Calcium: 8.8 mg/dL — ABNORMAL LOW (ref 8.9–10.3)
Chloride: 105 mmol/L (ref 98–111)
Creatinine, Ser: 0.44 mg/dL (ref 0.44–1.00)
GFR, Estimated: >60 mL/min (ref >60)
Glucose, Bld: 131 mg/dL — ABNORMAL HIGH (ref 70–99)
Potassium: 4 mmol/L (ref 3.5–5.1)
Sodium: 134 mmol/L — ABNORMAL LOW (ref 135–145)
Total Bilirubin: 0.8 mg/dL (ref 0.0–1.2)
Total Protein: 6.4 g/dL — ABNORMAL LOW (ref 6.5–8.1)

## 2023-12-30 LAB — CBC
HCT: 39.7 % (ref 36.0–46.0)
Hemoglobin: 13.3 g/dL (ref 12.0–15.0)
MCH: 36.3 pg — ABNORMAL HIGH (ref 26.0–34.0)
MCHC: 33.5 g/dL (ref 30.0–36.0)
MCV: 108.5 fL — ABNORMAL HIGH (ref 80.0–100.0)
Platelets: 364 K/uL (ref 150–400)
RBC: 3.66 MIL/uL — ABNORMAL LOW (ref 3.87–5.11)
RDW: 13 % (ref 11.5–15.5)
WBC: 8 K/uL (ref 4.0–10.5)
nRBC: 0 % (ref 0.0–0.2)

## 2023-12-30 MED ORDER — ROFLUMILAST 500 MCG PO TABS
500.0000 ug | ORAL_TABLET | Freq: Every day | ORAL | Status: DC
  Administered 2023-12-30 – 2024-01-03 (×5): 500 ug via ORAL
  Filled 2023-12-30 (×5): qty 1

## 2023-12-30 MED ORDER — FUROSEMIDE 10 MG/ML IJ SOLN
60.0000 mg | Freq: Once | INTRAMUSCULAR | Status: AC
  Administered 2023-12-30: 60 mg via INTRAVENOUS
  Filled 2023-12-30: qty 6

## 2023-12-30 MED ORDER — ARFORMOTEROL TARTRATE 15 MCG/2ML IN NEBU
15.0000 ug | INHALATION_SOLUTION | Freq: Two times a day (BID) | RESPIRATORY_TRACT | Status: DC
  Administered 2023-12-30 – 2024-01-04 (×10): 15 ug via RESPIRATORY_TRACT
  Filled 2023-12-30 (×13): qty 2

## 2023-12-30 MED ORDER — LEVOFLOXACIN 500 MG PO TABS
750.0000 mg | ORAL_TABLET | Freq: Every day | ORAL | Status: AC
  Administered 2023-12-31 – 2024-01-02 (×3): 750 mg via ORAL
  Filled 2023-12-30 (×3): qty 2

## 2023-12-30 NOTE — Progress Notes (Signed)
 Patient becomes dyspneic with exertion. Patient toilet to [facility] and had difficulty recovering. Respiratory therapist in to administer breathing TX.  Respiratory changed patients heated high flow to 45 L at 60%. Patient stating between 90-92%.

## 2023-12-30 NOTE — Plan of Care (Signed)
  Problem: Clinical Measurements: Goal: Ability to maintain clinical measurements within normal limits will improve Outcome: Progressing   Problem: Activity: Goal: Risk for activity intolerance will decrease Outcome: Progressing   Problem: Nutrition: Goal: Adequate nutrition will be maintained Outcome: Progressing   Problem: Safety: Goal: Ability to remain free from injury will improve Outcome: Progressing   

## 2023-12-30 NOTE — Plan of Care (Signed)

## 2023-12-30 NOTE — Progress Notes (Signed)
 PROGRESS NOTE    Joy Ball  FMW:[id number] DOB: Apr 05, 1966 DOA: 12/24/2023 PCP: Edman Marsa PARAS, DO    Brief Narrative:   Joy Ball is a 58 y.o. female  with medical history significant for COPD, pulmonary fibrosis, GERD, and adenocarcinoma of the right lung s/p radiation therapy and chronic hypokalemia who presented to the ED for evaluation of cough, left-sided chest pain and shortness of breath.  When EMS arrived, she was hypoxic with oxygen saturation in the 70s.    Assessment & Plan:   Principal Problem:   Acute respiratory failure with hypoxia (HCC) Active Problems:   Shortness of breath   Pneumonia due to infectious organism  Community-acquired pneumonia with left-sided pleuritic chest pain, cough and shortness of breath, immunocompromised patient:  Respiratory viral panel negative.  COVID-negative.  Strep pneumo antigen negative.   Plan:  Continue Levaquin       Acute hypoxic respiratory failure:  Respiratory status worsening.  Now up to 45 L 60% on heated high flow nasal cannula.  Discussed case with pulmonary.  Steroids twice daily.  Frequent nebulizers.  MetaNeb every 6 hours.  Adding Daliresp .  High risk for further deterioration.  If patient was to deteriorate beyond this point will need transfer to stepdown unit and and initiation of continuous NIPPV with consideration for endotracheal intubation.   Hypotension: BP improved     Hypokalemia: Improved     Probable acute exacerbation of COPD (extensive emphysema on CT chest), pulmonary fibrosis: As above.  IV steroids, frequent bronchodilators, MetaNeb every 6 hours, Daliresp .  Prognosis.     Primary adenocarcinoma of the right lung, stage Ia, s/p radiation therapy (completed in April 2025).  Acute issues     Anxiety: Continue as needed p.o. Xanax     DVT prophylaxis: SQ Lovenox  Code Status: Full Family Communication: None Disposition Plan: Status is: Inpatient Remains inpatient appropriate  because: Severe acute hypoxic respiratory failure   Level of care: Progressive  Consultants:  Pulmonary  Procedures:  None  Antimicrobials: Levaquin    Subjective: Seen and examined.  Reports no improvement in respiratory status over interval.  Objective: Vitals:   12/30/23 0606 12/30/23 0611 12/30/23 1136 12/30/23 1329  BP:   103/75   Pulse:   95   Resp:   16   Temp:   97.6 F (36.4 C)   TempSrc:      SpO2: 90% 91% 96% 96%  Weight:      Height:       No intake or output data in the 24 hours ending 12/30/23 1519 Filed Weights   12/24/23 2107 12/25/23 0043  Weight: 52.2 kg 52.2 kg    Examination:  General exam: Appears weak and fatigued Respiratory system: Diffuse crackles bilaterally.  Increased work of breathing.  45 L 60% Cardiovascular system: S1-S2, RRR, no murmurs, pedal edema Gastrointestinal system: Abdomen is nondistended, soft and nontender. No organomegaly or masses felt. Normal bowel sounds heard. Central nervous system: Alert and oriented. No focal neurological deficits. Extremities: Symmetric 5 x 5 power. Skin: No rashes, lesions or ulcers Psychiatry: Judgement and insight appear normal. Mood & affect appropriate.     Data Reviewed: I have personally reviewed following labs and imaging studies  CBC: Recent Labs  Lab 12/24/23 2147 12/25/23 0506 12/26/23 0437 12/30/23 0417  WBC 10.5 11.0* 8.3 8.0  NEUTROABS 7.4  --   --   --   HGB 14.4 13.8 13.4 13.3  HCT 42.0 42.1 39.4 39.7  MCV 108.2* 113.2* 109.1* 108.5*  PLT 410* 350 322 364   Basic Metabolic Panel: Recent Labs  Lab 12/24/23 2147 12/25/23 0659 12/26/23 0437 12/30/23 0417  NA 139 141 137 134*  K 3.0* 4.1 3.6 4.0  CL 107 110 107 105  CO2 20* 22 21* 19*  GLUCOSE 83 91 92 131*  BUN 8 11 11 10   CREATININE 0.58 0.60 0.51 0.44  CALCIUM 8.6* 8.0* 8.1* 8.8*  MG  --  2.0  --   --    GFR: Estimated Creatinine Clearance: 56.4 mL/min (by C-G formula based on SCr of 0.44  mg/dL). Liver Function Tests: Recent Labs  Lab 12/25/23 0659 12/30/23 0417  AST 31 29  ALT 19 18  ALKPHOS 157* 163*  BILITOT 0.5 0.8  PROT 5.0* 6.4*  ALBUMIN 2.4* 2.8*   No results for input(s): LIPASE, AMYLASE in the last 168 hours. No results for input(s): AMMONIA in the last 168 hours. Coagulation Profile: No results for input(s): INR, PROTIME in the last 168 hours. Cardiac Enzymes: No results for input(s): CKTOTAL, CKMB, CKMBINDEX, TROPONINI in the last 168 hours. BNP (last 3 results) No results for input(s): PROBNP in the last 8760 hours. HbA1C: No results for input(s): HGBA1C in the last 72 hours. CBG: Recent Labs  Lab 12/27/23 1553  GLUCAP 89   Lipid Profile: No results for input(s): CHOL, HDL, LDLCALC, TRIG, CHOLHDL, LDLDIRECT in the last 72 hours. Thyroid  Function Tests: No results for input(s): TSH, T4TOTAL, FREET4, T3FREE, THYROIDAB in the last 72 hours. Anemia Panel: No results for input(s): VITAMINB12, FOLATE, FERRITIN, TIBC, IRON, RETICCTPCT in the last 72 hours. Sepsis Labs: Recent Labs  Lab 12/25/23 0053  PROCALCITON 0.16    Recent Results (from the past 240 hours)  Blood culture (routine x 2)     Status: None   Collection Time: 12/24/23 11:00 PM   Specimen: BLOOD  Result Value Ref Range Status   Specimen Description BLOOD BLOOD LEFT ARM  Final   Special Requests   Final    BOTTLES DRAWN AEROBIC AND ANAEROBIC Blood Culture adequate volume   Culture   Final    NO GROWTH 5 DAYS Performed at [facility], [address] [place], [place] [address]    Report Status 12/29/2023 FINAL  Final  Blood culture (routine x 2)     Status: None   Collection Time: 12/24/23 11:20 PM   Specimen: BLOOD  Result Value Ref Range Status   Specimen Description BLOOD BLOOD RIGHT HAND  Final   Special Requests   Final    BOTTLES DRAWN AEROBIC AND ANAEROBIC Blood Culture adequate volume   Culture    Final    NO GROWTH 5 DAYS Performed at [facility], [address], [place], [place] [address]    Report Status 12/29/2023 FINAL  Final  Respiratory (~20 pathogens) panel by PCR     Status: None   Collection Time: 12/25/23  1:09 AM   Specimen: Nasal Mucosa; Respiratory  Result Value Ref Range Status   Adenovirus NOT DETECTED NOT DETECTED Final   Coronavirus 229E NOT DETECTED NOT DETECTED Final    Comment: (NOTE) The Coronavirus on the Respiratory Panel, DOES NOT test for the novel  Coronavirus (2019 nCoV)    Coronavirus HKU1 NOT DETECTED NOT DETECTED Final   Coronavirus NL63 NOT DETECTED NOT DETECTED Final   Coronavirus OC43 NOT DETECTED NOT DETECTED Final   Metapneumovirus NOT DETECTED NOT DETECTED Final   Rhinovirus / Enterovirus NOT DETECTED NOT DETECTED Final   Influenza  A NOT DETECTED NOT DETECTED Final   Influenza B NOT DETECTED NOT DETECTED Final   Parainfluenza Virus 1 NOT DETECTED NOT DETECTED Final   Parainfluenza Virus 2 NOT DETECTED NOT DETECTED Final   Parainfluenza Virus 3 NOT DETECTED NOT DETECTED Final   Parainfluenza Virus 4 NOT DETECTED NOT DETECTED Final   Respiratory Syncytial Virus NOT DETECTED NOT DETECTED Final   Bordetella pertussis NOT DETECTED NOT DETECTED Final   Bordetella Parapertussis NOT DETECTED NOT DETECTED Final   Chlamydophila pneumoniae NOT DETECTED NOT DETECTED Final   Mycoplasma pneumoniae NOT DETECTED NOT DETECTED Final    Comment: Performed at [facility], [address], [place], [place] [address]  MRSA Next Gen by PCR, Nasal     Status: None   Collection Time: 12/25/23  1:09 AM   Specimen: Nasal Mucosa; Nasal Swab  Result Value Ref Range Status   MRSA by PCR Next Gen NOT DETECTED NOT DETECTED Final    Comment: (NOTE) The GeneXpert MRSA Assay (FDA approved for NASAL specimens only), is one component of a comprehensive MRSA colonization surveillance program. It is not intended to diagnose MRSA infection nor to guide or  monitor treatment for MRSA infections. Test performance is not FDA approved in patients less than 10 years old. Performed at [facility], [address] [place], [place] [address]   SARS Coronavirus 2 by RT PCR (hospital order, performed in [facility] hospital lab) *cepheid single result test* Anterior Nasal Swab     Status: None   Collection Time: 12/28/23  1:05 PM   Specimen: Anterior Nasal Swab  Result Value Ref Range Status   SARS Coronavirus 2 by RT PCR NEGATIVE NEGATIVE Final    Comment: (NOTE) SARS-CoV-2 target nucleic acids are NOT DETECTED.  The SARS-CoV-2 RNA is generally detectable in upper and lower respiratory specimens during the acute phase of infection. The lowest concentration of SARS-CoV-2 viral copies this assay can detect is 250 copies / mL. A negative result does not preclude SARS-CoV-2 infection and should not be used as the sole basis for treatment or other patient management decisions.  A negative result may occur with improper specimen collection / handling, submission of specimen other than nasopharyngeal swab, presence of viral mutation(s) within the areas targeted by this assay, and inadequate number of viral copies (<250 copies / mL). A negative result must be combined with clinical observations, patient history, and epidemiological information.  Fact Sheet for Patients:   RoadLapTop.co.za  Fact Sheet for Healthcare Providers: http://kim-miller.com/  This test is not yet approved or  cleared by the [place]  FDA and has been authorized for detection and/or diagnosis of SARS-CoV-2 by FDA under an Emergency Use Authorization (EUA).  This EUA will remain in effect (meaning this test can be used) for the duration of the COVID-19 declaration under Section 564(b)(1) of the Act, 21 U.S.C. section 360bbb-3(b)(1), unless the authorization is terminated or revoked sooner.  Performed at [facility], [address] [place], [place] [address]          Radiology Studies: DG Chest 2 View Result Date: 12/29/2023 CLINICAL DATA:  Hypoxia. EXAM: CHEST - 2 VIEW COMPARISON:  December 24, 2023. FINDINGS: Stable cardiomediastinal silhouette. Increased bibasilar opacities are noted concerning for worsening pneumonia or edema. Bony thorax is unremarkable. IMPRESSION: Increased bibasilar opacities concerning for worsening pneumonia or edema. Electronically Signed   By: Lynwood Landy Raddle M.D.   On: 12/29/2023 13:10  Scheduled Meds:  arformoterol   15 mcg Nebulization BID   budesonide  (PULMICORT ) nebulizer solution  0.5 mg Nebulization BID   buPROPion   150 mg Oral BID   dextromethorphan -guaiFENesin   1 tablet Oral BID   enoxaparin  (LOVENOX ) injection  40 mg Subcutaneous Q24H   gabapentin   300 mg Oral QHS   guaiFENesin   600 mg Oral BID   ipratropium-albuterol   3 mL Nebulization Q6H   lidocaine   1 patch Transdermal Q24H   loratadine   10 mg Oral Daily   methylPREDNISolone  (SOLU-MEDROL ) injection  40 mg Intravenous BID   naproxen   500 mg Oral BID WC   pantoprazole   40 mg Oral Daily   roflumilast   500 mcg Oral Daily   Continuous Infusions:   LOS: 6 days    Calvin KATHEE Robson, MD [facility]   If 7PM-7AM, please contact night-coverage  12/30/2023, 3:19 PM

## 2023-12-31 DIAGNOSIS — J9601 Acute respiratory failure with hypoxia: Secondary | ICD-10-CM | POA: Diagnosis not present

## 2023-12-31 MED ORDER — ALPRAZOLAM 0.25 MG PO TABS
0.2500 mg | ORAL_TABLET | Freq: Once | ORAL | Status: AC
  Administered 2023-12-31: 0.25 mg via ORAL
  Filled 2023-12-31: qty 1

## 2023-12-31 MED ORDER — FUROSEMIDE 10 MG/ML IJ SOLN
40.0000 mg | Freq: Once | INTRAMUSCULAR | Status: AC
  Administered 2023-12-31: 40 mg via INTRAVENOUS
  Filled 2023-12-31: qty 4

## 2023-12-31 MED ORDER — MORPHINE SULFATE (PF) 2 MG/ML IV SOLN: 2.0000 mg | INTRAVENOUS | Status: DC | PRN

## 2023-12-31 NOTE — Plan of Care (Signed)

## 2023-12-31 NOTE — Progress Notes (Signed)
 PROGRESS NOTE    Joy Ball  FMW:[id number] DOB: March 26, 1966 DOA: 12/24/2023 PCP: Edman Marsa PARAS, DO    Brief Narrative:   Joy Ball is a 58 y.o. female  with medical history significant for COPD, pulmonary fibrosis, GERD, and adenocarcinoma of the right lung s/p radiation therapy and chronic hypokalemia who presented to the ED for evaluation of cough, left-sided chest pain and shortness of breath.  When EMS arrived, she was hypoxic with oxygen saturation in the 70s.    Assessment & Plan:   Principal Problem:   Acute respiratory failure with hypoxia (HCC) Active Problems:   Shortness of breath   Pneumonia due to infectious organism  Community-acquired pneumonia with left-sided pleuritic chest pain, cough and shortness of breath, immunocompromised patient:  Respiratory viral panel negative.  COVID-negative.  Strep pneumo antigen negative.   Plan:  Continue Levaquin  for 3 additional days     Acute hypoxic respiratory failure:  Respiratory status appears to be improving.  Weaned to 12 L high flow.  Remains mildly tachypneic.  Continue with current regimen of IV steroids, aggressive bronchodilators, Daliresp , every 6 hours MetaNeb.  Continue to wean down oxygen as tolerated.  High risk for further deterioration.  If patient were to further deteriorate would need transfer to stepdown unit and initiation of NIPPV with consideration for endotracheal intubation.  Patient will be a very high risk for intubation  Hypotension: BP improved     Hypokalemia: Improved     Probable acute exacerbation of COPD (extensive emphysema on CT chest), pulmonary fibrosis: As above.  IV steroids, bronchodilators, MetaNeb every 6 hours, Daliresp , prognosis guarded     Primary adenocarcinoma of the right lung, stage Ia, s/p radiation therapy (completed in April 2025).  No acute issues     Anxiety: Continue as needed p.o. Xanax     DVT prophylaxis: SQ Lovenox  Code Status: Full Family  Communication: Brother at bedside 7/10 Disposition Plan: Status is: Inpatient Remains inpatient appropriate because: Severe acute hypoxic respiratory failure   Level of care: Progressive  Consultants:  Pulmonary  Procedures:  None  Antimicrobials: Levaquin    Subjective: Examined.  Reports feeling about the same as yesterday.  Objective: Vitals:   12/31/23 0754 12/31/23 1152 12/31/23 1330 12/31/23 1343  BP:      Pulse:    (!) 110  Resp:    (!) 26  Temp: 97.6 F (36.4 C) 98 F (36.7 C)    TempSrc: Axillary Oral    SpO2: 94%  96% 98%  Weight:      Height:        Intake/Output Summary (Last 24 hours) at 12/31/2023 1448 Last data filed at 12/31/2023 1434 Gross per 24 hour  Intake 240 ml  Output 1000 ml  Net -760 ml   Filed Weights   12/24/23 2107 12/25/23 0043  Weight: 52.2 kg 52.2 kg    Examination:  General exam: Fatigued Respiratory system: Scattered diffuse crackles.  Normal work of breathing.  45 L 60% Cardiovascular system: S1-S2, RRR, no murmurs, pedal edema Gastrointestinal system: Abdomen is nondistended, soft and nontender. No organomegaly or masses felt. Normal bowel sounds heard. Central nervous system: Alert and oriented. No focal neurological deficits. Extremities: Symmetric 5 x 5 power. Skin: No rashes, lesions or ulcers Psychiatry: Judgement and insight appear normal. Mood & affect appropriate.     Data Reviewed: I have personally reviewed following labs and imaging studies  CBC: Recent Labs  Lab 12/24/23 2147 12/25/23 0506 12/26/23 0437 12/30/23 0417  WBC 10.5 11.0* 8.3 8.0  NEUTROABS 7.4  --   --   --   HGB 14.4 13.8 13.4 13.3  HCT 42.0 42.1 39.4 39.7  MCV 108.2* 113.2* 109.1* 108.5*  PLT 410* 350 322 364   Basic Metabolic Panel: Recent Labs  Lab 12/24/23 2147 12/25/23 0659 12/26/23 0437 12/30/23 0417  NA 139 141 137 134*  K 3.0* 4.1 3.6 4.0  CL 107 110 107 105  CO2 20* 22 21* 19*  GLUCOSE 83 91 92 131*  BUN 8 11 11 10    CREATININE 0.58 0.60 0.51 0.44  CALCIUM 8.6* 8.0* 8.1* 8.8*  MG  --  2.0  --   --    GFR: Estimated Creatinine Clearance: 56.4 mL/min (by C-G formula based on SCr of 0.44 mg/dL). Liver Function Tests: Recent Labs  Lab 12/25/23 0659 12/30/23 0417  AST 31 29  ALT 19 18  ALKPHOS 157* 163*  BILITOT 0.5 0.8  PROT 5.0* 6.4*  ALBUMIN 2.4* 2.8*   No results for input(s): LIPASE, AMYLASE in the last 168 hours. No results for input(s): AMMONIA in the last 168 hours. Coagulation Profile: No results for input(s): INR, PROTIME in the last 168 hours. Cardiac Enzymes: No results for input(s): CKTOTAL, CKMB, CKMBINDEX, TROPONINI in the last 168 hours. BNP (last 3 results) No results for input(s): PROBNP in the last 8760 hours. HbA1C: No results for input(s): HGBA1C in the last 72 hours. CBG: Recent Labs  Lab 12/27/23 1553  GLUCAP 89   Lipid Profile: No results for input(s): CHOL, HDL, LDLCALC, TRIG, CHOLHDL, LDLDIRECT in the last 72 hours. Thyroid  Function Tests: No results for input(s): TSH, T4TOTAL, FREET4, T3FREE, THYROIDAB in the last 72 hours. Anemia Panel: No results for input(s): VITAMINB12, FOLATE, FERRITIN, TIBC, IRON, RETICCTPCT in the last 72 hours. Sepsis Labs: Recent Labs  Lab 12/25/23 0053  PROCALCITON 0.16    Recent Results (from the past 240 hours)  Blood culture (routine x 2)     Status: None   Collection Time: 12/24/23 11:00 PM   Specimen: BLOOD  Result Value Ref Range Status   Specimen Description BLOOD BLOOD LEFT ARM  Final   Special Requests   Final    BOTTLES DRAWN AEROBIC AND ANAEROBIC Blood Culture adequate volume   Culture   Final    NO GROWTH 5 DAYS Performed at [facility], [address], [place], [place] [address]    Report Status 12/29/2023 FINAL  Final  Blood culture (routine x 2)     Status: None   Collection Time: 12/24/23 11:20 PM   Specimen: BLOOD  Result Value Ref  Range Status   Specimen Description BLOOD BLOOD RIGHT HAND  Final   Special Requests   Final    BOTTLES DRAWN AEROBIC AND ANAEROBIC Blood Culture adequate volume   Culture   Final    NO GROWTH 5 DAYS Performed at [facility], [address] [place], [place] [address]    Report Status 12/29/2023 FINAL  Final  Respiratory (~20 pathogens) panel by PCR     Status: None   Collection Time: 12/25/23  1:09 AM   Specimen: Nasal Mucosa; Respiratory  Result Value Ref Range Status   Adenovirus NOT DETECTED NOT DETECTED Final   Coronavirus 229E NOT DETECTED NOT DETECTED Final    Comment: (NOTE) The Coronavirus on the Respiratory Panel, DOES NOT test for the novel  Coronavirus (2019 nCoV)    Coronavirus HKU1 NOT DETECTED NOT DETECTED Final   Coronavirus  NL63 NOT DETECTED NOT DETECTED Final   Coronavirus OC43 NOT DETECTED NOT DETECTED Final   Metapneumovirus NOT DETECTED NOT DETECTED Final   Rhinovirus / Enterovirus NOT DETECTED NOT DETECTED Final   Influenza A NOT DETECTED NOT DETECTED Final   Influenza B NOT DETECTED NOT DETECTED Final   Parainfluenza Virus 1 NOT DETECTED NOT DETECTED Final   Parainfluenza Virus 2 NOT DETECTED NOT DETECTED Final   Parainfluenza Virus 3 NOT DETECTED NOT DETECTED Final   Parainfluenza Virus 4 NOT DETECTED NOT DETECTED Final   Respiratory Syncytial Virus NOT DETECTED NOT DETECTED Final   Bordetella pertussis NOT DETECTED NOT DETECTED Final   Bordetella Parapertussis NOT DETECTED NOT DETECTED Final   Chlamydophila pneumoniae NOT DETECTED NOT DETECTED Final   Mycoplasma pneumoniae NOT DETECTED NOT DETECTED Final    Comment: Performed at [facility], [address], [place], [place] [address]  MRSA Next Gen by PCR, Nasal     Status: None   Collection Time: 12/25/23  1:09 AM   Specimen: Nasal Mucosa; Nasal Swab  Result Value Ref Range Status   MRSA by PCR Next Gen NOT DETECTED NOT DETECTED Final    Comment: (NOTE) The GeneXpert MRSA Assay  (FDA approved for NASAL specimens only), is one component of a comprehensive MRSA colonization surveillance program. It is not intended to diagnose MRSA infection nor to guide or monitor treatment for MRSA infections. Test performance is not FDA approved in patients less than 17 years old. Performed at [facility], [address] [place], [place] [address]   SARS Coronavirus 2 by RT PCR (hospital order, performed in [facility] hospital lab) *cepheid single result test* Anterior Nasal Swab     Status: None   Collection Time: 12/28/23  1:05 PM   Specimen: Anterior Nasal Swab  Result Value Ref Range Status   SARS Coronavirus 2 by RT PCR NEGATIVE NEGATIVE Final    Comment: (NOTE) SARS-CoV-2 target nucleic acids are NOT DETECTED.  The SARS-CoV-2 RNA is generally detectable in upper and lower respiratory specimens during the acute phase of infection. The lowest concentration of SARS-CoV-2 viral copies this assay can detect is 250 copies / mL. A negative result does not preclude SARS-CoV-2 infection and should not be used as the sole basis for treatment or other patient management decisions.  A negative result may occur with improper specimen collection / handling, submission of specimen other than nasopharyngeal swab, presence of viral mutation(s) within the areas targeted by this assay, and inadequate number of viral copies (<250 copies / mL). A negative result must be combined with clinical observations, patient history, and epidemiological information.  Fact Sheet for Patients:   RoadLapTop.co.za  Fact Sheet for Healthcare Providers: http://kim-miller.com/  This test is not yet approved or  cleared by the [place]  FDA and has been authorized for detection and/or diagnosis of SARS-CoV-2 by FDA under an Emergency Use Authorization (EUA).  This EUA will remain in effect (meaning this test can be used) for the duration of  the COVID-19 declaration under Section 564(b)(1) of the Act, 21 U.S.C. section 360bbb-3(b)(1), unless the authorization is terminated or revoked sooner.  Performed at [facility], [address], [place], [place] [address]          Radiology Studies: No results found.       Scheduled Meds:  arformoterol   15 mcg Nebulization BID   budesonide  (PULMICORT ) nebulizer solution  0.5 mg Nebulization BID   buPROPion   150 mg Oral  BID   dextromethorphan -guaiFENesin   1 tablet Oral BID   enoxaparin  (LOVENOX ) injection  40 mg Subcutaneous Q24H   gabapentin   300 mg Oral QHS   guaiFENesin   600 mg Oral BID   ipratropium-albuterol   3 mL Nebulization Q6H   levofloxacin   750 mg Oral Daily   lidocaine   1 patch Transdermal Q24H   loratadine   10 mg Oral Daily   methylPREDNISolone  (SOLU-MEDROL ) injection  40 mg Intravenous BID   naproxen   500 mg Oral BID WC   pantoprazole   40 mg Oral Daily   roflumilast   500 mcg Oral Daily   Continuous Infusions:   LOS: 7 days    Calvin KATHEE Robson, MD [facility]   If 7PM-7AM, please contact night-coverage  12/31/2023, 2:48 PM

## 2023-12-31 NOTE — Plan of Care (Signed)
 Care discussed with Dr Jhonny Severe COPD exacerbation Therapy for pneumonia and pneumonitis Continue NEBS, ABX, STEROIDS, LASIX  as needed Daliresp  started, morphine  as needed  Patient looks better but still requiring high amounts of oxygen FiO2 (%):  [50 %-60 %] 50 %  Continue medical therapy as above  Patient  satisfied with Plan of action and management. All questions answered   Nickolas Alm Cellar, M.D.  [facility]  Medical Director [facility] Medical Director [facility] Cardio-Pulmonary Department

## 2024-01-01 DIAGNOSIS — J9601 Acute respiratory failure with hypoxia: Secondary | ICD-10-CM | POA: Diagnosis not present

## 2024-01-01 LAB — BASIC METABOLIC PANEL WITH GFR
Anion gap: 13 (ref 5–15)
BUN: 23 mg/dL — ABNORMAL HIGH (ref 6–20)
CO2: 23 mmol/L (ref 22–32)
Calcium: 9.2 mg/dL (ref 8.9–10.3)
Chloride: 96 mmol/L — ABNORMAL LOW (ref 98–111)
Creatinine, Ser: 0.69 mg/dL (ref 0.44–1.00)
GFR, Estimated: >60 mL/min (ref >60)
Glucose, Bld: 113 mg/dL — ABNORMAL HIGH (ref 70–99)
Potassium: 3.7 mmol/L (ref 3.5–5.1)
Sodium: 132 mmol/L — ABNORMAL LOW (ref 135–145)

## 2024-01-01 MED ORDER — ALPRAZOLAM 0.25 MG PO TABS
0.2500 mg | ORAL_TABLET | Freq: Two times a day (BID) | ORAL | Status: DC | PRN
  Administered 2024-01-03: 0.25 mg via ORAL
  Filled 2024-01-01: qty 1

## 2024-01-01 MED ORDER — GUAIFENESIN-DM 100-10 MG/5ML PO SYRP: 5.0000 mL | ORAL_SOLUTION | ORAL | Status: DC | PRN

## 2024-01-01 MED ORDER — FUROSEMIDE 10 MG/ML IJ SOLN
40.0000 mg | Freq: Once | INTRAMUSCULAR | Status: AC
  Administered 2024-01-01: 40 mg via INTRAVENOUS
  Filled 2024-01-01: qty 4

## 2024-01-01 NOTE — Plan of Care (Signed)

## 2024-01-01 NOTE — TOC Progression Note (Signed)
 Transition of Care [facility]) - Progression Note    Patient Details  Name: Joy Ball MRN: [id number] Date of Birth: 09/01/1965  Transition of Care [facility]) CM/SW Contact  Tomasa JAYSON Childes, RN Phone Number: 01/01/2024, 11:04 AM  Clinical Narrative:    TOC continuing to follow patient's progress throughout discharge planning.        Expected Discharge Plan and Services                                               Social Determinants of Health (SDOH) Interventions SDOH Screenings   Food Insecurity: Food Insecurity Present (12/25/2023)  Housing: Low Risk  (12/25/2023)  Transportation Needs: No Transportation Needs (12/25/2023)  Utilities: Not At Risk (12/25/2023)  Alcohol Screen: Low Risk  (11/11/2022)  Depression (PHQ2-9): Medium Risk (10/05/2023)  Social Connections: Unknown (12/22/2022)   Received from [facility]  Tobacco Use: High Risk (12/24/2023)  Health Literacy: Adequate Health Literacy (08/19/2023)    Readmission Risk Interventions     No data to display

## 2024-01-01 NOTE — Progress Notes (Signed)
 PROGRESS NOTE    ARNETTA ODEH  FMW:[id number] DOB: 04/11/1966 DOA: 12/24/2023 PCP: Edman Marsa PARAS, DO    Brief Narrative:   Joy Ball is a 58 y.o. female  with medical history significant for COPD, pulmonary fibrosis, GERD, and adenocarcinoma of the right lung s/p radiation therapy and chronic hypokalemia who presented to the ED for evaluation of cough, left-sided chest pain and shortness of breath.  When EMS arrived, she was hypoxic with oxygen saturation in the 70s.    Assessment & Plan:   Principal Problem:   Acute respiratory failure with hypoxia (HCC) Active Problems:   Shortness of breath   Pneumonia due to infectious organism  Community-acquired pneumonia with left-sided pleuritic chest pain, cough and shortness of breath, immunocompromised patient:  Respiratory viral panel negative.  COVID-negative.  Strep pneumo antigen negative.   Plan:  Continue Levaquin  to complete course     Acute hypoxic respiratory failure:  Continues to improve.  Weaned to 4 L nasal cannula.  Remains mildly tachypneic.  No overlying anxiety complicating the situation.  Continue with regimen of intravenous steroids daily, bronchodilators, Daliresp , MetaNeb.  Continue to wean oxygen as tolerated.  Patient without a home oxygen requirement.  Hypotension: BP improved     Hypokalemia: Improved     Probable acute exacerbation of COPD (extensive emphysema on CT chest), pulmonary fibrosis: As above.  IV steroids, bronchodilators, MetaNeb every 6 hours, Daliresp , prognosis guarded     Primary adenocarcinoma of the right lung, stage Ia, s/p radiation therapy (completed in April 2025).  No acute issues     Anxiety: Continue as needed p.o. Xanax     DVT prophylaxis: SQ Lovenox  Code Status: Full Family Communication: Brother at bedside 7/10 Disposition Plan: Status is: Inpatient Remains inpatient appropriate because: Severe acute hypoxic respiratory failure   Level of care:  Progressive  Consultants:  Pulmonary  Procedures:  None  Antimicrobials: Levaquin    Subjective: Seen and examined.  Feeling better.  Objective: Vitals:   01/01/24 0815 01/01/24 1100 01/01/24 1142 01/01/24 1329  BP: 98/77 106/78    Pulse:  (!) 115    Resp:  20 (!) 22   Temp: 98 F (36.7 C) 97.8 F (36.6 C)    TempSrc: Oral Oral    SpO2:    96%  Weight:      Height:        Intake/Output Summary (Last 24 hours) at 01/01/2024 1343 Last data filed at 01/01/2024 0900 Gross per 24 hour  Intake 240 ml  Output 200 ml  Net 40 ml   Filed Weights   12/24/23 2107 12/25/23 0043  Weight: 52.2 kg 52.2 kg    Examination:  General exam: Appears fatigued Respiratory system: Scattered crackles.  Normal work of breathing.  4 L Cardiovascular system: S1-S2, RRR, no murmurs, pedal edema Gastrointestinal system: Soft, NT/ND, normal bowel sounds Central nervous system: Alert and oriented. No focal neurological deficits. Extremities: Symmetric 5 x 5 power. Skin: No rashes, lesions or ulcers Psychiatry: Judgement and insight appear normal. Mood & affect appropriate.     Data Reviewed: I have personally reviewed following labs and imaging studies  CBC: Recent Labs  Lab 12/26/23 0437 12/30/23 0417  WBC 8.3 8.0  HGB 13.4 13.3  HCT 39.4 39.7  MCV 109.1* 108.5*  PLT 322 364   Basic Metabolic Panel: Recent Labs  Lab 12/26/23 0437 12/30/23 0417 01/01/24 0603  NA 137 134* 132*  K 3.6 4.0 3.7  CL 107 105 96*  CO2  21* 19* 23  GLUCOSE 92 131* 113*  BUN 11 10 23*  CREATININE 0.51 0.44 0.69  CALCIUM 8.1* 8.8* 9.2   GFR: Estimated Creatinine Clearance: 56.4 mL/min (by C-G formula based on SCr of 0.69 mg/dL). Liver Function Tests: Recent Labs  Lab 12/30/23 0417  AST 29  ALT 18  ALKPHOS 163*  BILITOT 0.8  PROT 6.4*  ALBUMIN 2.8*   No results for input(s): LIPASE, AMYLASE in the last 168 hours. No results for input(s): AMMONIA in the last 168  hours. Coagulation Profile: No results for input(s): INR, PROTIME in the last 168 hours. Cardiac Enzymes: No results for input(s): CKTOTAL, CKMB, CKMBINDEX, TROPONINI in the last 168 hours. BNP (last 3 results) No results for input(s): PROBNP in the last 8760 hours. HbA1C: No results for input(s): HGBA1C in the last 72 hours. CBG: Recent Labs  Lab 12/27/23 1553  GLUCAP 89   Lipid Profile: No results for input(s): CHOL, HDL, LDLCALC, TRIG, CHOLHDL, LDLDIRECT in the last 72 hours. Thyroid  Function Tests: No results for input(s): TSH, T4TOTAL, FREET4, T3FREE, THYROIDAB in the last 72 hours. Anemia Panel: No results for input(s): VITAMINB12, FOLATE, FERRITIN, TIBC, IRON, RETICCTPCT in the last 72 hours. Sepsis Labs: No results for input(s): PROCALCITON, LATICACIDVEN in the last 168 hours.   Recent Results (from the past 240 hours)  Blood culture (routine x 2)     Status: None   Collection Time: 12/24/23 11:00 PM   Specimen: BLOOD  Result Value Ref Range Status   Specimen Description BLOOD BLOOD LEFT ARM  Final   Special Requests   Final    BOTTLES DRAWN AEROBIC AND ANAEROBIC Blood Culture adequate volume   Culture   Final    NO GROWTH 5 DAYS Performed at [facility], [address] [place], [place] [address]    Report Status 12/29/2023 FINAL  Final  Blood culture (routine x 2)     Status: None   Collection Time: 12/24/23 11:20 PM   Specimen: BLOOD  Result Value Ref Range Status   Specimen Description BLOOD BLOOD RIGHT HAND  Final   Special Requests   Final    BOTTLES DRAWN AEROBIC AND ANAEROBIC Blood Culture adequate volume   Culture   Final    NO GROWTH 5 DAYS Performed at [facility], [address], [place], [place] [address]    Report Status 12/29/2023 FINAL  Final  Respiratory (~20 pathogens) panel by PCR     Status: None   Collection Time: 12/25/23  1:09 AM   Specimen: Nasal Mucosa;  Respiratory  Result Value Ref Range Status   Adenovirus NOT DETECTED NOT DETECTED Final   Coronavirus 229E NOT DETECTED NOT DETECTED Final    Comment: (NOTE) The Coronavirus on the Respiratory Panel, DOES NOT test for the novel  Coronavirus (2019 nCoV)    Coronavirus HKU1 NOT DETECTED NOT DETECTED Final   Coronavirus NL63 NOT DETECTED NOT DETECTED Final   Coronavirus OC43 NOT DETECTED NOT DETECTED Final   Metapneumovirus NOT DETECTED NOT DETECTED Final   Rhinovirus / Enterovirus NOT DETECTED NOT DETECTED Final   Influenza A NOT DETECTED NOT DETECTED Final   Influenza B NOT DETECTED NOT DETECTED Final   Parainfluenza Virus 1 NOT DETECTED NOT DETECTED Final   Parainfluenza Virus 2 NOT DETECTED NOT DETECTED Final   Parainfluenza Virus 3 NOT DETECTED NOT DETECTED Final   Parainfluenza Virus 4 NOT DETECTED NOT DETECTED Final   Respiratory Syncytial Virus NOT DETECTED NOT DETECTED Final  Bordetella pertussis NOT DETECTED NOT DETECTED Final   Bordetella Parapertussis NOT DETECTED NOT DETECTED Final   Chlamydophila pneumoniae NOT DETECTED NOT DETECTED Final   Mycoplasma pneumoniae NOT DETECTED NOT DETECTED Final    Comment: Performed at [facility], [address] [place], [place] [address]  MRSA Next Gen by PCR, Nasal     Status: None   Collection Time: 12/25/23  1:09 AM   Specimen: Nasal Mucosa; Nasal Swab  Result Value Ref Range Status   MRSA by PCR Next Gen NOT DETECTED NOT DETECTED Final    Comment: (NOTE) The GeneXpert MRSA Assay (FDA approved for NASAL specimens only), is one component of a comprehensive MRSA colonization surveillance program. It is not intended to diagnose MRSA infection nor to guide or monitor treatment for MRSA infections. Test performance is not FDA approved in patients less than 64 years old. Performed at [facility], [address] [place], [place] [address]   SARS Coronavirus 2 by RT PCR (hospital order, performed in [facility]  hospital lab) *cepheid single result test* Anterior Nasal Swab     Status: None   Collection Time: 12/28/23  1:05 PM   Specimen: Anterior Nasal Swab  Result Value Ref Range Status   SARS Coronavirus 2 by RT PCR NEGATIVE NEGATIVE Final    Comment: (NOTE) SARS-CoV-2 target nucleic acids are NOT DETECTED.  The SARS-CoV-2 RNA is generally detectable in upper and lower respiratory specimens during the acute phase of infection. The lowest concentration of SARS-CoV-2 viral copies this assay can detect is 250 copies / mL. A negative result does not preclude SARS-CoV-2 infection and should not be used as the sole basis for treatment or other patient management decisions.  A negative result may occur with improper specimen collection / handling, submission of specimen other than nasopharyngeal swab, presence of viral mutation(s) within the areas targeted by this assay, and inadequate number of viral copies (<250 copies / mL). A negative result must be combined with clinical observations, patient history, and epidemiological information.  Fact Sheet for Patients:   RoadLapTop.co.za  Fact Sheet for Healthcare Providers: http://kim-miller.com/  This test is not yet approved or  cleared by the [place]  FDA and has been authorized for detection and/or diagnosis of SARS-CoV-2 by FDA under an Emergency Use Authorization (EUA).  This EUA will remain in effect (meaning this test can be used) for the duration of the COVID-19 declaration under Section 564(b)(1) of the Act, 21 U.S.C. section 360bbb-3(b)(1), unless the authorization is terminated or revoked sooner.  Performed at [facility], [address] [place], [place] [address]          Radiology Studies: No results found.       Scheduled Meds:  arformoterol   15 mcg Nebulization BID   budesonide  (PULMICORT ) nebulizer solution  0.5 mg Nebulization BID   buPROPion   150 mg  Oral BID   enoxaparin  (LOVENOX ) injection  40 mg Subcutaneous Q24H   gabapentin   300 mg Oral QHS   guaiFENesin   600 mg Oral BID   ipratropium-albuterol   3 mL Nebulization Q6H   levofloxacin   750 mg Oral Daily   lidocaine   1 patch Transdermal Q24H   loratadine   10 mg Oral Daily   methylPREDNISolone  (SOLU-MEDROL ) injection  40 mg Intravenous BID   naproxen   500 mg Oral BID WC   pantoprazole   40 mg Oral Daily   roflumilast   500 mcg Oral Daily   Continuous Infusions:   LOS: 8 days  Calvin KATHEE Robson, MD [facility]   If 7PM-7AM, please contact night-coverage  01/01/2024, 1:43 PM

## 2024-01-01 NOTE — Plan of Care (Signed)

## 2024-01-02 DIAGNOSIS — J9601 Acute respiratory failure with hypoxia: Secondary | ICD-10-CM | POA: Diagnosis not present

## 2024-01-02 MED ORDER — FUROSEMIDE 10 MG/ML IJ SOLN
40.0000 mg | Freq: Once | INTRAMUSCULAR | Status: AC
  Administered 2024-01-02: 40 mg via INTRAVENOUS
  Filled 2024-01-02: qty 4

## 2024-01-02 MED ORDER — METHYLPREDNISOLONE SODIUM SUCC 40 MG IJ SOLR
40.0000 mg | Freq: Every day | INTRAMUSCULAR | Status: DC
  Administered 2024-01-03: 40 mg via INTRAVENOUS
  Filled 2024-01-02: qty 1

## 2024-01-02 NOTE — Progress Notes (Signed)
 PROGRESS NOTE    DOYCE STONEHOUSE  FMW:[id number] DOB: [birth year] DOA: 12/24/2023 PCP: Edman Marsa PARAS, DO    Brief Narrative:   Joy Ball is a 58 y.o. female  with medical history significant for COPD, pulmonary fibrosis, GERD, and adenocarcinoma of the right lung s/p radiation therapy and chronic hypokalemia who presented to the ED for evaluation of cough, left-sided chest pain and shortness of breath.  When EMS arrived, she was hypoxic with oxygen saturation in the 70s.    Assessment & Plan:   Principal Problem:   Acute respiratory failure with hypoxia (HCC) Active Problems:   Shortness of breath   Pneumonia due to infectious organism  Community-acquired pneumonia with left-sided pleuritic chest pain, cough and shortness of breath, immunocompromised patient:  Respiratory viral panel negative.  COVID-negative.  Strep pneumo antigen negative.   Plan:  Completed course of Levaquin .  No further antibiotics at this time.     Acute hypoxic respiratory failure:  Continues to improve.  Weaned to 2 L L nasal cannula.  Work of breathing improved.  Wean intravenous steroids.  Repeat Lasix  40 mg IV x 1.  Continue bronchodilators, diuretics, MetaNeb.  Continue to wean oxygen as tolerated.    Hypotension: BP improved     Hypokalemia: Improved     Probable acute exacerbation of COPD (extensive emphysema on CT chest), pulmonary fibrosis: As above.  IV steroids, bronchodilators, MetaNeb every 6 hours, Daliresp , acute prognosis improved     Primary adenocarcinoma of the right lung, stage Ia, s/p radiation therapy (completed in April 2025).  No acute issues     Anxiety: Continue as needed p.o. Xanax     DVT prophylaxis: SQ Lovenox  Code Status: Full Family Communication: Brother at bedside 7/10 Disposition Plan: Status is: Inpatient Remains inpatient appropriate because: Severe acute hypoxic respiratory failure   Level of care: Progressive  Consultants:   Pulmonary  Procedures:  None  Antimicrobials: Levaquin    Subjective: Examined.  Remains fatigued feeling better.  Objective: Vitals:   01/02/24 0407 01/02/24 0806 01/02/24 0809 01/02/24 1340  BP: 97/72  100/79   Pulse: 97  95   Resp: 20  14   Temp: 98.5 F (36.9 C)  97.6 F (36.4 C)   TempSrc:   Axillary   SpO2: 95% 95% 99% 99%  Weight:      Height:        Intake/Output Summary (Last 24 hours) at 01/02/2024 1400 Last data filed at 01/02/2024 0900 Gross per 24 hour  Intake 480 ml  Output 600 ml  Net -120 ml   Filed Weights   12/24/23 2107 12/25/23 0043  Weight: 52.2 kg 52.2 kg    Examination:  General exam: Fatigued Respiratory system: Crackles at bases.  Normal work of breathing.  2 L Cardiovascular system: S1-S2, RRR, no murmurs, pedal edema Gastrointestinal system: Soft, NT/ND, normal bowel sounds Central nervous system: Alert and oriented. No focal neurological deficits. Extremities: Symmetric 5 x 5 power. Skin: No rashes, lesions or ulcers Psychiatry: Judgement and insight appear normal. Mood & affect appropriate.     Data Reviewed: I have personally reviewed following labs and imaging studies  CBC: Recent Labs  Lab 12/30/23 0417  WBC 8.0  HGB 13.3  HCT 39.7  MCV 108.5*  PLT 364   Basic Metabolic Panel: Recent Labs  Lab 12/30/23 0417 01/01/24 0603  NA 134* 132*  K 4.0 3.7  CL 105 96*  CO2 19* 23  GLUCOSE 131* 113*  BUN 10 23*  CREATININE 0.44 0.69  CALCIUM 8.8* 9.2   GFR: Estimated Creatinine Clearance: 56.4 mL/min (by C-G formula based on SCr of 0.69 mg/dL). Liver Function Tests: Recent Labs  Lab 12/30/23 0417  AST 29  ALT 18  ALKPHOS 163*  BILITOT 0.8  PROT 6.4*  ALBUMIN 2.8*   No results for input(s): LIPASE, AMYLASE in the last 168 hours. No results for input(s): AMMONIA in the last 168 hours. Coagulation Profile: No results for input(s): INR, PROTIME in the last 168 hours. Cardiac Enzymes: No results  for input(s): CKTOTAL, CKMB, CKMBINDEX, TROPONINI in the last 168 hours. BNP (last 3 results) No results for input(s): PROBNP in the last 8760 hours. HbA1C: No results for input(s): HGBA1C in the last 72 hours. CBG: Recent Labs  Lab 12/27/23 1553  GLUCAP 89   Lipid Profile: No results for input(s): CHOL, HDL, LDLCALC, TRIG, CHOLHDL, LDLDIRECT in the last 72 hours. Thyroid  Function Tests: No results for input(s): TSH, T4TOTAL, FREET4, T3FREE, THYROIDAB in the last 72 hours. Anemia Panel: No results for input(s): VITAMINB12, FOLATE, FERRITIN, TIBC, IRON, RETICCTPCT in the last 72 hours. Sepsis Labs: No results for input(s): PROCALCITON, LATICACIDVEN in the last 168 hours.   Recent Results (from the past 240 hours)  Blood culture (routine x 2)     Status: None   Collection Time: 12/24/23 11:00 PM   Specimen: BLOOD  Result Value Ref Range Status   Specimen Description BLOOD BLOOD LEFT ARM  Final   Special Requests   Final    BOTTLES DRAWN AEROBIC AND ANAEROBIC Blood Culture adequate volume   Culture   Final    NO GROWTH 5 DAYS Performed at [facility], [address], [place], [place] [address]    Report Status 12/29/2023 FINAL  Final  Blood culture (routine x 2)     Status: None   Collection Time: 12/24/23 11:20 PM   Specimen: BLOOD  Result Value Ref Range Status   Specimen Description BLOOD BLOOD RIGHT HAND  Final   Special Requests   Final    BOTTLES DRAWN AEROBIC AND ANAEROBIC Blood Culture adequate volume   Culture   Final    NO GROWTH 5 DAYS Performed at [facility], [address] [place], [place] [address]    Report Status 12/29/2023 FINAL  Final  Respiratory (~20 pathogens) panel by PCR     Status: None   Collection Time: 12/25/23  1:09 AM   Specimen: Nasal Mucosa; Respiratory  Result Value Ref Range Status   Adenovirus NOT DETECTED NOT DETECTED Final   Coronavirus 229E NOT DETECTED NOT  DETECTED Final    Comment: (NOTE) The Coronavirus on the Respiratory Panel, DOES NOT test for the novel  Coronavirus (2019 nCoV)    Coronavirus HKU1 NOT DETECTED NOT DETECTED Final   Coronavirus NL63 NOT DETECTED NOT DETECTED Final   Coronavirus OC43 NOT DETECTED NOT DETECTED Final   Metapneumovirus NOT DETECTED NOT DETECTED Final   Rhinovirus / Enterovirus NOT DETECTED NOT DETECTED Final   Influenza A NOT DETECTED NOT DETECTED Final   Influenza B NOT DETECTED NOT DETECTED Final   Parainfluenza Virus 1 NOT DETECTED NOT DETECTED Final   Parainfluenza Virus 2 NOT DETECTED NOT DETECTED Final   Parainfluenza Virus 3 NOT DETECTED NOT DETECTED Final   Parainfluenza Virus 4 NOT DETECTED NOT DETECTED Final   Respiratory Syncytial Virus NOT DETECTED NOT DETECTED Final   Bordetella pertussis NOT DETECTED NOT DETECTED Final   Bordetella Parapertussis NOT DETECTED NOT DETECTED  Final   Chlamydophila pneumoniae NOT DETECTED NOT DETECTED Final   Mycoplasma pneumoniae NOT DETECTED NOT DETECTED Final    Comment: Performed at [facility], [address] [place], [place] [address]  MRSA Next Gen by PCR, Nasal     Status: None   Collection Time: 12/25/23  1:09 AM   Specimen: Nasal Mucosa; Nasal Swab  Result Value Ref Range Status   MRSA by PCR Next Gen NOT DETECTED NOT DETECTED Final    Comment: (NOTE) The GeneXpert MRSA Assay (FDA approved for NASAL specimens only), is one component of a comprehensive MRSA colonization surveillance program. It is not intended to diagnose MRSA infection nor to guide or monitor treatment for MRSA infections. Test performance is not FDA approved in patients less than 44 years old. Performed at [facility], [address] [place], [place] [address]   SARS Coronavirus 2 by RT PCR (hospital order, performed in [facility] hospital lab) *cepheid single result test* Anterior Nasal Swab     Status: None   Collection Time: 12/28/23  1:05 PM   Specimen:  Anterior Nasal Swab  Result Value Ref Range Status   SARS Coronavirus 2 by RT PCR NEGATIVE NEGATIVE Final    Comment: (NOTE) SARS-CoV-2 target nucleic acids are NOT DETECTED.  The SARS-CoV-2 RNA is generally detectable in upper and lower respiratory specimens during the acute phase of infection. The lowest concentration of SARS-CoV-2 viral copies this assay can detect is 250 copies / mL. A negative result does not preclude SARS-CoV-2 infection and should not be used as the sole basis for treatment or other patient management decisions.  A negative result may occur with improper specimen collection / handling, submission of specimen other than nasopharyngeal swab, presence of viral mutation(s) within the areas targeted by this assay, and inadequate number of viral copies (<250 copies / mL). A negative result must be combined with clinical observations, patient history, and epidemiological information.  Fact Sheet for Patients:   RoadLapTop.co.za  Fact Sheet for Healthcare Providers: http://kim-miller.com/  This test is not yet approved or  cleared by the [place]  FDA and has been authorized for detection and/or diagnosis of SARS-CoV-2 by FDA under an Emergency Use Authorization (EUA).  This EUA will remain in effect (meaning this test can be used) for the duration of the COVID-19 declaration under Section 564(b)(1) of the Act, 21 U.S.C. section 360bbb-3(b)(1), unless the authorization is terminated or revoked sooner.  Performed at [facility], [address] [place], [place] [address]          Radiology Studies: No results found.       Scheduled Meds:  arformoterol   15 mcg Nebulization BID   budesonide  (PULMICORT ) nebulizer solution  0.5 mg Nebulization BID   buPROPion   150 mg Oral BID   enoxaparin  (LOVENOX ) injection  40 mg Subcutaneous Q24H   gabapentin   300 mg Oral QHS   guaiFENesin   600 mg Oral BID    ipratropium-albuterol   3 mL Nebulization Q6H   lidocaine   1 patch Transdermal Q24H   loratadine   10 mg Oral Daily   [START ON 01/03/2024] methylPREDNISolone  (SOLU-MEDROL ) injection  40 mg Intravenous Daily   naproxen   500 mg Oral BID WC   pantoprazole   40 mg Oral Daily   roflumilast   500 mcg Oral Daily   Continuous Infusions:   LOS: 9 days    Calvin KATHEE Robson, MD [facility]   If 7PM-7AM, please contact night-coverage  01/02/2024, 2:00 PM

## 2024-01-02 NOTE — Plan of Care (Signed)

## 2024-01-03 DIAGNOSIS — J9601 Acute respiratory failure with hypoxia: Secondary | ICD-10-CM | POA: Diagnosis not present

## 2024-01-03 MED ORDER — IPRATROPIUM-ALBUTEROL 0.5-2.5 (3) MG/3ML IN SOLN
3.0000 mL | Freq: Two times a day (BID) | RESPIRATORY_TRACT | Status: DC
  Administered 2024-01-04: 3 mL via RESPIRATORY_TRACT
  Filled 2024-01-03: qty 3

## 2024-01-03 MED ORDER — FUROSEMIDE 10 MG/ML IJ SOLN
40.0000 mg | Freq: Once | INTRAMUSCULAR | Status: AC
  Administered 2024-01-03: 40 mg via INTRAVENOUS
  Filled 2024-01-03: qty 4

## 2024-01-03 MED ORDER — PREDNISONE 20 MG PO TABS
40.0000 mg | ORAL_TABLET | Freq: Every day | ORAL | Status: DC
  Administered 2024-01-04: 40 mg via ORAL
  Filled 2024-01-03: qty 2

## 2024-01-03 NOTE — Progress Notes (Signed)
 PROGRESS NOTE    CANDY LEVERETT  FMW:[id number] DOB: 20-Dec-1965 DOA: 12/24/2023 PCP: Edman Marsa PARAS, DO    Brief Narrative:   Joy Ball is a 58 y.o. female  with medical history significant for COPD, pulmonary fibrosis, GERD, and adenocarcinoma of the right lung s/p radiation therapy and chronic hypokalemia who presented to the ED for evaluation of cough, left-sided chest pain and shortness of breath.  When EMS arrived, she was hypoxic with oxygen saturation in the 70s.    Assessment & Plan:   Principal Problem:   Acute respiratory failure with hypoxia (HCC) Active Problems:   Shortness of breath   Pneumonia due to infectious organism  Community-acquired pneumonia with left-sided pleuritic chest pain, cough and shortness of breath, immunocompromised patient:  Respiratory viral panel negative.  COVID-negative.  Strep pneumo antigen negative.   Plan:  Completed course of Levaquin .  No further antibiotics at this time. Continue to monitor vitals and fever curve  Acute hypoxic respiratory failure:  Continues to improve.  Weaned to 2 L L nasal cannula.  Work of breathing improved.  Wean intravenous steroids.  Repeat Lasix  40 mg IV x 1.  P.o. prednisone  from tomorrow.  Hypotension: BP improved     Hypokalemia: Improved     Probable acute exacerbation of COPD (extensive emphysema on CT chest), pulmonary fibrosis: As above.  Last dose of IV steroid today.  Wean bronchodilators.  Discontinue Daliresp .     Primary adenocarcinoma of the right lung, stage Ia, s/p radiation therapy (completed in April 2025).  No acute issues     Anxiety: Continue as needed p.o. Xanax     DVT prophylaxis: SQ Lovenox  Code Status: Full Family Communication: Brother at bedside 7/10 Disposition Plan: Status is: Inpatient Remains inpatient appropriate because: Severe acute hypoxic respiratory failure   Level of care: Progressive  Consultants:  Pulmonary  Procedures:   None  Antimicrobials:   Subjective: Seen and examined.  Fatigue but improved.  Objective: Vitals:   01/02/24 2300 01/03/24 0300 01/03/24 0745 01/03/24 0800  BP: 101/84 104/78  100/78  Pulse:  95  96  Resp:  (!) 22  (!) 23  Temp: 98.3 F (36.8 C) 98.2 F (36.8 C)  98.5 F (36.9 C)  TempSrc: Oral Oral  Oral  SpO2:  95% 95% 98%  Weight:      Height:        Intake/Output Summary (Last 24 hours) at 01/03/2024 1209 Last data filed at 01/03/2024 0900 Gross per 24 hour  Intake 480 ml  Output --  Net 480 ml   Filed Weights   12/24/23 2107 12/25/23 0043  Weight: 52.2 kg 52.2 kg    Examination:  General exam: NAD.  Appears fatigued Respiratory system: Scattered crackles.  Normal work of breathing.  2 L Cardiovascular system: S1-S2, RRR, no murmurs, pedal edema Gastrointestinal system: Soft, NT/ND, normal bowel sounds Central nervous system: Alert and oriented. No focal neurological deficits. Extremities: Symmetric 5 x 5 power. Skin: No rashes, lesions or ulcers Psychiatry: Judgement and insight appear normal. Mood & affect appropriate.     Data Reviewed: I have personally reviewed following labs and imaging studies  CBC: Recent Labs  Lab 12/30/23 0417  WBC 8.0  HGB 13.3  HCT 39.7  MCV 108.5*  PLT 364   Basic Metabolic Panel: Recent Labs  Lab 12/30/23 0417 01/01/24 0603  NA 134* 132*  K 4.0 3.7  CL 105 96*  CO2 19* 23  GLUCOSE 131* 113*  BUN 10  23*  CREATININE 0.44 0.69  CALCIUM 8.8* 9.2   GFR: Estimated Creatinine Clearance: 56.4 mL/min (by C-G formula based on SCr of 0.69 mg/dL). Liver Function Tests: Recent Labs  Lab 12/30/23 0417  AST 29  ALT 18  ALKPHOS 163*  BILITOT 0.8  PROT 6.4*  ALBUMIN 2.8*   No results for input(s): LIPASE, AMYLASE in the last 168 hours. No results for input(s): AMMONIA in the last 168 hours. Coagulation Profile: No results for input(s): INR, PROTIME in the last 168 hours. Cardiac Enzymes: No  results for input(s): CKTOTAL, CKMB, CKMBINDEX, TROPONINI in the last 168 hours. BNP (last 3 results) No results for input(s): PROBNP in the last 8760 hours. HbA1C: No results for input(s): HGBA1C in the last 72 hours. CBG: Recent Labs  Lab 12/27/23 1553  GLUCAP 89   Lipid Profile: No results for input(s): CHOL, HDL, LDLCALC, TRIG, CHOLHDL, LDLDIRECT in the last 72 hours. Thyroid  Function Tests: No results for input(s): TSH, T4TOTAL, FREET4, T3FREE, THYROIDAB in the last 72 hours. Anemia Panel: No results for input(s): VITAMINB12, FOLATE, FERRITIN, TIBC, IRON, RETICCTPCT in the last 72 hours. Sepsis Labs: No results for input(s): PROCALCITON, LATICACIDVEN in the last 168 hours.   Recent Results (from the past 240 hours)  Blood culture (routine x 2)     Status: None   Collection Time: 12/24/23 11:00 PM   Specimen: BLOOD  Result Value Ref Range Status   Specimen Description BLOOD BLOOD LEFT ARM  Final   Special Requests   Final    BOTTLES DRAWN AEROBIC AND ANAEROBIC Blood Culture adequate volume   Culture   Final    NO GROWTH 5 DAYS Performed at [facility], [address] [place], [place] [address]    Report Status 12/29/2023 FINAL  Final  Blood culture (routine x 2)     Status: None   Collection Time: 12/24/23 11:20 PM   Specimen: BLOOD  Result Value Ref Range Status   Specimen Description BLOOD BLOOD RIGHT HAND  Final   Special Requests   Final    BOTTLES DRAWN AEROBIC AND ANAEROBIC Blood Culture adequate volume   Culture   Final    NO GROWTH 5 DAYS Performed at [facility], [address], [place], [place] [address]    Report Status 12/29/2023 FINAL  Final  Respiratory (~20 pathogens) panel by PCR     Status: None   Collection Time: 12/25/23  1:09 AM   Specimen: Nasal Mucosa; Respiratory  Result Value Ref Range Status   Adenovirus NOT DETECTED NOT DETECTED Final   Coronavirus 229E NOT  DETECTED NOT DETECTED Final    Comment: (NOTE) The Coronavirus on the Respiratory Panel, DOES NOT test for the novel  Coronavirus (2019 nCoV)    Coronavirus HKU1 NOT DETECTED NOT DETECTED Final   Coronavirus NL63 NOT DETECTED NOT DETECTED Final   Coronavirus OC43 NOT DETECTED NOT DETECTED Final   Metapneumovirus NOT DETECTED NOT DETECTED Final   Rhinovirus / Enterovirus NOT DETECTED NOT DETECTED Final   Influenza A NOT DETECTED NOT DETECTED Final   Influenza B NOT DETECTED NOT DETECTED Final   Parainfluenza Virus 1 NOT DETECTED NOT DETECTED Final   Parainfluenza Virus 2 NOT DETECTED NOT DETECTED Final   Parainfluenza Virus 3 NOT DETECTED NOT DETECTED Final   Parainfluenza Virus 4 NOT DETECTED NOT DETECTED Final   Respiratory Syncytial Virus NOT DETECTED NOT DETECTED Final   Bordetella pertussis NOT DETECTED NOT DETECTED Final   Bordetella Parapertussis NOT DETECTED  NOT DETECTED Final   Chlamydophila pneumoniae NOT DETECTED NOT DETECTED Final   Mycoplasma pneumoniae NOT DETECTED NOT DETECTED Final    Comment: Performed at [facility], [address], [place], [place] [address]  MRSA Next Gen by PCR, Nasal     Status: None   Collection Time: 12/25/23  1:09 AM   Specimen: Nasal Mucosa; Nasal Swab  Result Value Ref Range Status   MRSA by PCR Next Gen NOT DETECTED NOT DETECTED Final    Comment: (NOTE) The GeneXpert MRSA Assay (FDA approved for NASAL specimens only), is one component of a comprehensive MRSA colonization surveillance program. It is not intended to diagnose MRSA infection nor to guide or monitor treatment for MRSA infections. Test performance is not FDA approved in patients less than 83 years old. Performed at [facility], [address] [place], [place] [address]   SARS Coronavirus 2 by RT PCR (hospital order, performed in [facility] hospital lab) *cepheid single result test* Anterior Nasal Swab     Status: None   Collection Time: 12/28/23  1:05 PM    Specimen: Anterior Nasal Swab  Result Value Ref Range Status   SARS Coronavirus 2 by RT PCR NEGATIVE NEGATIVE Final    Comment: (NOTE) SARS-CoV-2 target nucleic acids are NOT DETECTED.  The SARS-CoV-2 RNA is generally detectable in upper and lower respiratory specimens during the acute phase of infection. The lowest concentration of SARS-CoV-2 viral copies this assay can detect is 250 copies / mL. A negative result does not preclude SARS-CoV-2 infection and should not be used as the sole basis for treatment or other patient management decisions.  A negative result may occur with improper specimen collection / handling, submission of specimen other than nasopharyngeal swab, presence of viral mutation(s) within the areas targeted by this assay, and inadequate number of viral copies (<250 copies / mL). A negative result must be combined with clinical observations, patient history, and epidemiological information.  Fact Sheet for Patients:   RoadLapTop.co.za  Fact Sheet for Healthcare Providers: http://kim-miller.com/  This test is not yet approved or  cleared by the [place]  FDA and has been authorized for detection and/or diagnosis of SARS-CoV-2 by FDA under an Emergency Use Authorization (EUA).  This EUA will remain in effect (meaning this test can be used) for the duration of the COVID-19 declaration under Section 564(b)(1) of the Act, 21 U.S.C. section 360bbb-3(b)(1), unless the authorization is terminated or revoked sooner.  Performed at [facility], [address] [place], [place] [address]          Radiology Studies: No results found.       Scheduled Meds:  arformoterol   15 mcg Nebulization BID   budesonide  (PULMICORT ) nebulizer solution  0.5 mg Nebulization BID   buPROPion   150 mg Oral BID   enoxaparin  (LOVENOX ) injection  40 mg Subcutaneous Q24H   gabapentin   300 mg Oral QHS   guaiFENesin   600  mg Oral BID   ipratropium-albuterol   3 mL Nebulization Q6H   lidocaine   1 patch Transdermal Q24H   loratadine   10 mg Oral Daily   naproxen   500 mg Oral BID WC   pantoprazole   40 mg Oral Daily   [START ON 01/04/2024] predniSONE   40 mg Oral Q breakfast   roflumilast   500 mcg Oral Daily   Continuous Infusions:   LOS: 10 days    Calvin KATHEE Robson, MD [facility]   If 7PM-7AM, please contact night-coverage  01/03/2024, 12:09  PM

## 2024-01-03 NOTE — Plan of Care (Signed)
  Problem: Clinical Measurements: Goal: Cardiovascular complication will be avoided Outcome: Progressing   Problem: Activity: Goal: Risk for activity intolerance will decrease Outcome: Progressing   Problem: Nutrition: Goal: Adequate nutrition will be maintained Outcome: Progressing   

## 2024-01-03 NOTE — Plan of Care (Signed)

## 2024-01-04 ENCOUNTER — Other Ambulatory Visit: Payer: Self-pay | Admitting: Family Medicine

## 2024-01-04 ENCOUNTER — Other Ambulatory Visit: Payer: Self-pay | Admitting: Oncology

## 2024-01-04 DIAGNOSIS — E876 Hypokalemia: Secondary | ICD-10-CM

## 2024-01-04 DIAGNOSIS — Z720 Tobacco use: Secondary | ICD-10-CM

## 2024-01-04 DIAGNOSIS — G8929 Other chronic pain: Secondary | ICD-10-CM

## 2024-01-04 DIAGNOSIS — J9601 Acute respiratory failure with hypoxia: Secondary | ICD-10-CM | POA: Diagnosis not present

## 2024-01-04 DIAGNOSIS — R2689 Other abnormalities of gait and mobility: Secondary | ICD-10-CM | POA: Diagnosis not present

## 2024-01-04 DIAGNOSIS — M7712 Lateral epicondylitis, left elbow: Secondary | ICD-10-CM

## 2024-01-04 MED ORDER — IPRATROPIUM-ALBUTEROL 0.5-2.5 (3) MG/3ML IN SOLN: 3.0000 mL | Freq: Two times a day (BID) | RESPIRATORY_TRACT | 0 refills | Status: DC

## 2024-01-04 MED ORDER — PREDNISONE 10 MG PO TABS: ORAL_TABLET | ORAL | 0 refills | Status: AC

## 2024-01-04 NOTE — Progress Notes (Signed)
 Occupational Therapy Evaluation Patient Details Name: Joy Ball MRN: [id number] DOB: [birth year] Today's Date: 01/04/2024   History of Present Illness   Pt is a 59 y.o. female who presented to the ED for evaluation of cough, left-sided chest pain and shortness of breath. PMH: COPD, pulmonary fibrosis, GERD, and adenocarcinoma of the right lung s/p radiation therapy and chronic hypokalemia     Clinical Impressions Ms Tetro was seen for OT evaluation this date. Prior to hospital admission, pt was IND in ADLs. Pt lives w/ significant other. Pt currently requires SUPERVISION w/o AD use for ambulation ~600 ft; no LOB noted, despite pt reporting feeling lightheaded and bending down to pick up something from floor.   SaO2 on room air at rest = 87% SaO2 on room air while ambulating = 85% SaO2 on 4 liters of O2 while ambulating = 88%   Pt educated on OT role and pursed lip breathing. No skilled OT needs, will sign off. Do not anticipate the need for follow up OT services upon acute hospital DC, may benefit from cardiopulmonary rehab.      If plan is discharge home, recommend the following:   A little help with walking and/or transfers;Assistance with cooking/housework;Help with stairs or ramp for entrance     Functional Status Assessment   Patient has had a recent decline in their functional status and demonstrates the ability to make significant improvements in function in a reasonable and predictable amount of time.     Equipment Recommendations   BSC/3in1     Recommendations for Other Services   Other (comment) (Cardiopulmonary rehab)     Precautions/Restrictions   Precautions Precautions: None Restrictions Weight Bearing Restrictions Per Provider Order: No     Mobility Bed Mobility               General bed mobility comments: Not tested - pt received in chair    Transfers Overall transfer level: Needs assistance Equipment used: None Transfers: Sit  to/from Stand Sit to Stand: Supervision                  Balance Overall balance assessment: Needs assistance Sitting-balance support: Feet supported Sitting balance-Leahy Scale: Normal     Standing balance support: No upper extremity supported Standing balance-Leahy Scale: Good                             ADL either performed or assessed with clinical judgement   ADL Overall ADL's : Needs assistance/impaired                                       General ADL Comments: SUPERVISION for ambulation w/o AD use ~200 ft     Vision         Perception         Praxis         Pertinent Vitals/Pain Pain Assessment Pain Assessment: No/denies pain     Extremity/Trunk Assessment Upper Extremity Assessment Upper Extremity Assessment: Overall WFL for tasks assessed   Lower Extremity Assessment Lower Extremity Assessment: Overall WFL for tasks assessed       Communication Communication Communication: No apparent difficulties   Cognition Arousal: Alert Behavior During Therapy: WFL for tasks assessed/performed Cognition: No apparent impairments  Following commands: Intact       Cueing  General Comments   Cueing Techniques: Verbal cues;Gestural cues  SpO2 87% on RA @rest ; 85% on RA w/ ambulation;   Exercises     Shoulder Instructions      Home Living Family/patient expects to be discharged to:: Private residence Living Arrangements: Spouse/significant other Available Help at Discharge: Family;Available PRN/intermittently (SO available 24/7 for one week) Type of Home: House Home Access: Stairs to enter [employer] of Steps: 1 Entrance Stairs-Rails: None Home Layout: Two level;Able to live on main level with bedroom/bathroom Alternate Level Stairs-Number of Steps: 12 Alternate Level Stairs-Rails: None Bathroom Shower/Tub: Chief Strategy Officer: Standard      Home Equipment: None          Prior Functioning/Environment Prior Level of Function : Independent/Modified Independent;Driving               ADLs Comments: IND in ADLs    OT Problem List: Decreased activity tolerance;Cardiopulmonary status limiting activity   OT Treatment/Interventions:        OT Goals(Current goals can be found in the care plan section)   Acute Rehab OT Goals Patient Stated Goal: to go home OT Goal Formulation: With patient Time For Goal Achievement: 01/04/24 Potential to Achieve Goals: Good   OT Frequency:       Co-evaluation              AM-PAC OT 6 Clicks Daily Activity     Outcome Measure Help from another person eating meals?: None Help from another person taking care of personal grooming?: None Help from another person toileting, which includes using toliet, bedpan, or urinal?: None Help from another person bathing (including washing, rinsing, drying)?: None Help from another person to put on and taking off regular upper body clothing?: None Help from another person to put on and taking off regular lower body clothing?: None 6 Click Score: 24   End of Session Equipment Utilized During Treatment: Gait belt  Activity Tolerance: Patient tolerated treatment well Patient left: in chair;with call bell/phone within reach  OT Visit Diagnosis: Other abnormalities of gait and mobility (R26.89)                Time: 9092-9070 OT Time Calculation (min): 22 min Charges:  OT General Charges $OT Visit: 1 Visit OT Evaluation $OT Eval Low Complexity: 1 Low OT Treatments $Self Care/Home Management : 8-22 mins  [place], Student OT   [employer] 01/04/2024, 10:10 AM

## 2024-01-04 NOTE — Plan of Care (Signed)

## 2024-01-04 NOTE — TOC Transition Note (Signed)
 Transition of Care [facility]) - Discharge Note   Patient Details  Name: Joy Ball MRN: [id number] Date of Birth: Dec 14, 1965  Transition of Care [facility]) CM/SW Contact:  Veera Stapleton C Tevis Dunavan, RN Phone Number: 01/04/2024, 11:20 AM   Clinical Narrative:    Spoke with patient regarding discharge home today. She is agreeable to receive oxygen and BSC. Patient was advised she would receive portable oxygen and a BSC to her room and  the remaining supplies today at her home. Patient significant other will transport her home today.   Oxygen and BSC request sent to [facility] from [employer].   TOC signing off.          Patient Goals and CMS Choice            Discharge Placement                       Discharge Plan and Services Additional resources added to the After Visit Summary for                                       Social Drivers of Health (SDOH) Interventions SDOH Screenings   Food Insecurity: Food Insecurity Present (12/25/2023)  Housing: Low Risk  (12/25/2023)  Transportation Needs: No Transportation Needs (12/25/2023)  Utilities: Not At Risk (12/25/2023)  Alcohol Screen: Low Risk  (11/11/2022)  Depression (PHQ2-9): Medium Risk (10/05/2023)  Social Connections: Unknown (12/22/2022)   Received from [facility]  Tobacco Use: High Risk (12/24/2023)  Health Literacy: Adequate Health Literacy (08/19/2023)     Readmission Risk Interventions     No data to display

## 2024-01-04 NOTE — Progress Notes (Addendum)
 Patient is not able to walk the distance required to go the bathroom, or he/she is unable to safely negotiate stairs required to access the bathroom.  A BSC will alleviate this problem.

## 2024-01-04 NOTE — Discharge Summary (Signed)
 Physician Discharge Summary  Joy Ball FMW:[id number] DOB: [birth year] DOA: 12/24/2023  PCP: Edman Marsa PARAS, DO  Admit date: 12/24/2023 Discharge date: 01/04/2024  Admitted From: Home Disposition:  Home  Recommendations for Outpatient Follow-up:  Follow up with PCP in 1-2 weeks Follow-up with pulmonary 1 week  Home Health: No Equipment/Devices: Oxygen 4 L via nasal cannula  Discharge Condition: Stable CODE STATUS: Full Diet recommendation: Regular  Brief/Interim Summary:  Joy Ball is a 58 y.o. female  with medical history significant for COPD, pulmonary fibrosis, GERD, and adenocarcinoma of the right lung s/p radiation therapy and chronic hypokalemia who presented to the ED for evaluation of cough, left-sided chest pain and shortness of breath.  When EMS arrived, she was hypoxic with oxygen saturation in the 70s.     Discharge Diagnoses:  Principal Problem:   Acute respiratory failure with hypoxia (HCC) Active Problems:   Shortness of breath   Pneumonia due to infectious organism   Community-acquired pneumonia with left-sided pleuritic chest pain, cough and shortness of breath, immunocompromised patient:  Respiratory viral panel negative.  COVID-negative.  Strep pneumo antigen negative.   Plan:  Completed course of Levaquin .  No further antibiotics at this time.    Acute hypoxic respiratory failure:  Continues to improve.  Will require home oxygen.  Desaturation noted on ambulation requiring 4 L.  DME oxygen ordered.  Resume home bronchodilator regimen.  DuoNebs prescribed.  Short steroid taper prescribed.  Follow-up outpatient pulmonology.  Case discussed with patient's outpatient pulmonologist.   Discharge Instructions  Discharge Instructions     Diet - low sodium heart healthy   Complete by: As directed    Increase activity slowly   Complete by: As directed       Allergies as of 01/04/2024       Reactions   Amoxicillin Hives         Medication List     TAKE these medications    acetaminophen  500 MG tablet Commonly known as: TYLENOL  Take 1,500 mg by mouth 3 (three) times daily. 2 to 3 times a day.   albuterol  108 (90 Base) MCG/ACT inhaler Commonly known as: VENTOLIN  HFA INHALE 2 PUFFS INTO THE LUNGS EVERY 6 HOURS AS NEEDED FOR WHEEZING OR SHORTNESS OF BREATH   budesonide -formoterol  160-4.5 MCG/ACT inhaler Commonly known as: Symbicort  Inhale 2 puffs into the lungs daily.   buPROPion  150 MG 12 hr tablet Commonly known as: WELLBUTRIN  SR TAKE 1 TABLET(150 MG) BY MOUTH TWICE DAILY   fluticasone  50 MCG/ACT nasal spray Commonly known as: FLONASE  Place 2 sprays into both nostrils daily.   gabapentin  300 MG capsule Commonly known as: NEURONTIN  TAKE 1 CAPSULE(300 MG) BY MOUTH AT BEDTIME   ipratropium 0.06 % nasal spray Commonly known as: ATROVENT  Place 2 sprays into both nostrils 4 (four) times daily. What changed: Another medication with the same name was removed. Continue taking this medication, and follow the directions you see here.   ipratropium-albuterol  0.5-2.5 (3) MG/3ML Soln Commonly known as: DUONEB Take 3 mLs by nebulization 2 (two) times daily.   loratadine  10 MG tablet Commonly known as: CLARITIN  Take 10 mg by mouth daily.   moxifloxacin 0.5 % ophthalmic solution Commonly known as: VIGAMOX Place 1 drop into the right eye 4 (four) times daily.   naproxen  500 MG tablet Commonly known as: NAPROSYN  TAKE 1 TABLET(500 MG) BY MOUTH TWICE DAILY WITH A MEAL   nystatin  100000 UNIT/ML suspension Commonly known as: MYCOSTATIN  SHAKE LIQUID AND TAKE 5 ML  BY MOUTH FOUR TIMES DAILY   omeprazole 20 MG capsule Commonly known as: PRILOSEC Take 20 mg by mouth daily.   potassium chloride  SA 20 MEQ tablet Commonly known as: KLOR-CON  M Take 1 tablet (20 mEq total) by mouth daily. What changed: Another medication with the same name was removed. Continue taking this medication, and follow the directions  you see here.   prednisoLONE acetate 1 % ophthalmic suspension Commonly known as: PRED FORTE Place 1 drop into the right eye 4 (four) times daily.   predniSONE  10 MG tablet Commonly known as: DELTASONE  Take 4 tablets (40 mg total) by mouth daily with breakfast for 3 days, THEN 3 tablets (30 mg total) daily with breakfast for 3 days, THEN 2 tablets (20 mg total) daily with breakfast for 3 days, THEN 1 tablet (10 mg total) daily with breakfast for 3 days. Start taking on: January 05, 2024   Spiriva  Respimat 2.5 MCG/ACT Aers Generic drug: Tiotropium Bromide  Monohydrate Inhale 2 puffs into the lungs daily.               Durable Medical Equipment  (From admission, onward)           Start     Ordered   01/04/24 1012  For home use only DME Bedside commode  Once       Question:  Patient needs a bedside commode to treat with the following condition  Answer:  Weakness   01/04/24 1012   01/04/24 1006  For home use only DME oxygen  Once       Question Answer Comment  Length of Need Lifetime   Mode or (Route) Nasal cannula   Liters per Minute 4   Frequency Continuous (stationary and portable oxygen unit needed)   Oxygen conserving device Yes   Oxygen delivery system Gas      01/04/24 1006            Allergies  Allergen Reactions   Amoxicillin Hives    Consultations: Pulmonology   Procedures/Studies: DG Chest 2 View Result Date: 12/29/2023 CLINICAL DATA:  Hypoxia. EXAM: CHEST - 2 VIEW COMPARISON:  December 24, 2023. FINDINGS: Stable cardiomediastinal silhouette. Increased bibasilar opacities are noted concerning for worsening pneumonia or edema. Bony thorax is unremarkable. IMPRESSION: Increased bibasilar opacities concerning for worsening pneumonia or edema. Electronically Signed   By: Lynwood Landy Raddle M.D.   On: 12/29/2023 13:10   CT Angio Chest PE W and/or Wo Contrast Result Date: 12/24/2023 CLINICAL DATA:  Left-sided chest pain. EXAM: CT ANGIOGRAPHY CHEST WITH CONTRAST  TECHNIQUE: Multidetector CT imaging of the chest was performed using the standard protocol during bolus administration of intravenous contrast. Multiplanar CT image reconstructions and MIPs were obtained to evaluate the vascular anatomy. RADIATION DOSE REDUCTION: This exam was performed according to the departmental dose-optimization program which includes automated exposure control, adjustment of the mA and/or kV according to patient size and/or use of iterative reconstruction technique. CONTRAST:  75mL OMNIPAQUE  IOHEXOL  350 MG/ML SOLN COMPARISON:  July 31, 2023 FINDINGS: Cardiovascular: The thoracic aorta is normal in appearance. Satisfactory opacification of the pulmonary arteries to the segmental level. No evidence of pulmonary embolism. Normal heart size. No pericardial effusion. Mediastinum/Nodes: There is mild AP window and mild pretracheal lymphadenopathy. Thyroid  gland, trachea, and esophagus demonstrate no significant findings. Lungs/Pleura: There is evidence of extensive emphysematous lung disease with marked severity, thick band-like areas of biapical scarring. This extends along the posterior aspects of the bilateral upper lobes, right greater than left.  The 1.9 cm x 1.5 cm posterolateral right upper lobe pulmonary nodule seen on the prior study has less distinct borders on the current exam and measures approximately 1.7 cm x 1.8 cm (axial CT images 44-53, CT series 5). Mild posteromedial right upper lobe atelectasis and/or infiltrate is present (axial CT images 33 through 48, CT series 5). Mild to moderate severity fibrotic changes are seen along the periphery of the bilateral lung bases. Moderate severity areas of ground-glass appearing lung parenchymal are seen throughout the right middle lobe and bilateral lower lobes. No pleural effusion or pneumothorax is identified. Upper Abdomen: There is a small hiatal hernia. Musculoskeletal: No chest wall abnormality. No acute or significant osseous  findings. Review of the MIP images confirms the above findings. IMPRESSION: 1. No evidence of pulmonary embolism. 2. Extensive emphysematous lung disease with marked severity, thick band-like areas of biapical scarring. 3. The posterolateral pulmonary nodule seen on the prior study has less distinct borders on the current exam. This was evaluated by nuclear medicine PET/CT (August 12, 2023) and remains suspicious for an underlying neoplasm. 4. Moderate severity areas of ground-glass appearing lung parenchymal throughout the right middle lobe and bilateral lower lobes, which may represent sequelae associated with an atypical or viral pneumonia. 5. Mild posteromedial right upper lobe atelectasis and/or infiltrate. 6. Small hiatal hernia. Electronically Signed   By: Suzen Dials M.D.   On: 12/24/2023 22:58   DG Chest Portable 1 View Result Date: 12/24/2023 CLINICAL DATA:  Shortness of breath and left-sided chest pain EXAM: PORTABLE CHEST 1 VIEW COMPARISON:  08/11/2023 FINDINGS: Interstitial coarsening and patchy basilar airspace opacities bilaterally. Additional subpleural irregular opacity along the right upper lung corresponds to the radiotracer avid nodule seen on PET/CT 07/31/2023. No pleural effusion or pneumothorax. No displaced rib fractures. Stable cardiomediastinal silhouette. IMPRESSION: 1. Interstitial coarsening and patchy basilar airspace opacities bilaterally concerning for infection. 2. Subpleural irregular opacity along the right upper lung corresponds to the radiotracer avid nodule seen on PET/CT 07/31/2023. Electronically Signed   By: Norman Gatlin M.D.   On: 12/24/2023 21:44      Subjective: Seen and examined on the day of discharge.  Stable no distress.  Appropriate for discharge home.  Discharge Exam: Vitals:   01/04/24 0705 01/04/24 0841  BP:  101/80  Pulse:  (!) 106  Resp:  18  Temp:  (!) 97.4 F (36.3 C)  SpO2: 90% 94%   Vitals:   01/04/24 0006 01/04/24 0345  01/04/24 0705 01/04/24 0841  BP: 94/73   101/80  Pulse: 98 91  (!) 106  Resp: 18 18  18   Temp: 98.1 F (36.7 C) 98.3 F (36.8 C)  (!) 97.4 F (36.3 C)  TempSrc: Oral Oral    SpO2: 95% 90% 90% 94%  Weight:      Height:        General: Pt is alert, awake, not in acute distress Cardiovascular: RRR, S1/S2 +, no rubs, no gallops Respiratory: CTA bilaterally, no wheezing, no rhonchi Abdominal: Soft, NT, ND, bowel sounds + Extremities: no edema, no cyanosis    The results of significant diagnostics from this hospitalization (including imaging, microbiology, ancillary and laboratory) are listed below for reference.     Microbiology: Recent Results (from the past 240 hours)  SARS Coronavirus 2 by RT PCR (hospital order, performed in [facility] hospital lab) *cepheid single result test* Anterior Nasal Swab     Status: None   Collection Time: 12/28/23  1:05 PM   Specimen: Anterior  Nasal Swab  Result Value Ref Range Status   SARS Coronavirus 2 by RT PCR NEGATIVE NEGATIVE Final    Comment: (NOTE) SARS-CoV-2 target nucleic acids are NOT DETECTED.  The SARS-CoV-2 RNA is generally detectable in upper and lower respiratory specimens during the acute phase of infection. The lowest concentration of SARS-CoV-2 viral copies this assay can detect is 250 copies / mL. A negative result does not preclude SARS-CoV-2 infection and should not be used as the sole basis for treatment or other patient management decisions.  A negative result may occur with improper specimen collection / handling, submission of specimen other than nasopharyngeal swab, presence of viral mutation(s) within the areas targeted by this assay, and inadequate number of viral copies (<250 copies / mL). A negative result must be combined with clinical observations, patient history, and epidemiological information.  Fact Sheet for Patients:   RoadLapTop.co.za  Fact Sheet for Healthcare  Providers: http://kim-miller.com/  This test is not yet approved or  cleared by the [place]  FDA and has been authorized for detection and/or diagnosis of SARS-CoV-2 by FDA under an Emergency Use Authorization (EUA).  This EUA will remain in effect (meaning this test can be used) for the duration of the COVID-19 declaration under Section 564(b)(1) of the Act, 21 U.S.C. section 360bbb-3(b)(1), unless the authorization is terminated or revoked sooner.  Performed at [facility], [address] [place], [place] [address]      Labs: BNP (last 3 results) No results for input(s): BNP in the last 8760 hours. Basic Metabolic Panel: Recent Labs  Lab 12/30/23 0417 01/01/24 0603  NA 134* 132*  K 4.0 3.7  CL 105 96*  CO2 19* 23  GLUCOSE 131* 113*  BUN 10 23*  CREATININE 0.44 0.69  CALCIUM 8.8* 9.2   Liver Function Tests: Recent Labs  Lab 12/30/23 0417  AST 29  ALT 18  ALKPHOS 163*  BILITOT 0.8  PROT 6.4*  ALBUMIN 2.8*   No results for input(s): LIPASE, AMYLASE in the last 168 hours. No results for input(s): AMMONIA in the last 168 hours. CBC: Recent Labs  Lab 12/30/23 0417  WBC 8.0  HGB 13.3  HCT 39.7  MCV 108.5*  PLT 364   Cardiac Enzymes: No results for input(s): CKTOTAL, CKMB, CKMBINDEX, TROPONINI in the last 168 hours. BNP: Invalid input(s): POCBNP CBG: No results for input(s): GLUCAP in the last 168 hours. D-Dimer No results for input(s): DDIMER in the last 72 hours. Hgb A1c No results for input(s): HGBA1C in the last 72 hours. Lipid Profile No results for input(s): CHOL, HDL, LDLCALC, TRIG, CHOLHDL, LDLDIRECT in the last 72 hours. Thyroid  function studies No results for input(s): TSH, T4TOTAL, T3FREE, THYROIDAB in the last 72 hours.  Invalid input(s): FREET3 Anemia work up No results for input(s): VITAMINB12, FOLATE, FERRITIN, TIBC, IRON, RETICCTPCT in the  last 72 hours. Urinalysis    Component Value Date/Time   COLORURINE YELLOW (A) 07/11/2015 2237   APPEARANCEUR CLEAR (A) 07/11/2015 2237   LABSPEC 1.012 07/11/2015 2237   PHURINE 6.0 07/11/2015 2237   GLUCOSEU NEGATIVE 07/11/2015 2237   HGBUR 1+ (A) 07/11/2015 2237   BILIRUBINUR negative 07/27/2015 1028   KETONESUR NEGATIVE 07/11/2015 2237   PROTEINUR negative 07/27/2015 1028   PROTEINUR NEGATIVE 07/11/2015 2237   UROBILINOGEN negative 07/27/2015 1028   NITRITE negative 07/27/2015 1028   NITRITE NEGATIVE 07/11/2015 2237   LEUKOCYTESUR Negative 07/27/2015 1028   Sepsis Labs Recent Labs  Lab 12/30/23 0417  WBC 8.0  Microbiology Recent Results (from the past 240 hours)  SARS Coronavirus 2 by RT PCR (hospital order, performed in [facility] hospital lab) *cepheid single result test* Anterior Nasal Swab     Status: None   Collection Time: 12/28/23  1:05 PM   Specimen: Anterior Nasal Swab  Result Value Ref Range Status   SARS Coronavirus 2 by RT PCR NEGATIVE NEGATIVE Final    Comment: (NOTE) SARS-CoV-2 target nucleic acids are NOT DETECTED.  The SARS-CoV-2 RNA is generally detectable in upper and lower respiratory specimens during the acute phase of infection. The lowest concentration of SARS-CoV-2 viral copies this assay can detect is 250 copies / mL. A negative result does not preclude SARS-CoV-2 infection and should not be used as the sole basis for treatment or other patient management decisions.  A negative result may occur with improper specimen collection / handling, submission of specimen other than nasopharyngeal swab, presence of viral mutation(s) within the areas targeted by this assay, and inadequate number of viral copies (<250 copies / mL). A negative result must be combined with clinical observations, patient history, and epidemiological information.  Fact Sheet for Patients:   RoadLapTop.co.za  Fact Sheet for Healthcare  Providers: http://kim-miller.com/  This test is not yet approved or  cleared by the [place]  FDA and has been authorized for detection and/or diagnosis of SARS-CoV-2 by FDA under an Emergency Use Authorization (EUA).  This EUA will remain in effect (meaning this test can be used) for the duration of the COVID-19 declaration under Section 564(b)(1) of the Act, 21 U.S.C. section 360bbb-3(b)(1), unless the authorization is terminated or revoked sooner.  Performed at [facility], [address] [place], [place] [address]      Time coordinating discharge: 40 minutes  SIGNED:   Calvin KATHEE Robson, MD  [facility] 01/04/2024, 5:42 PM Pager   If 7PM-7AM, please contact night-coverage

## 2024-01-04 NOTE — Plan of Care (Signed)

## 2024-01-04 NOTE — Progress Notes (Signed)
 PT Cancellation Note  Patient Details Name: Joy Ball MRN: [id number] DOB: 1966/05/08   Cancelled Treatment:    Reason Eval/Treat Not Completed: PT screened, no needs identified, will sign off.  Received PT (imminent discharge) order. OT recently saw pt this morning and reporting walking pt around nursing station 4x's (no assistive device use) on 4 L O2 and reports balance looks good; OT reports no PT needs; PT to sign off--MD updated/notified.   Damien Caulk, PT 01/04/24, 10:36 AM

## 2024-01-06 NOTE — Telephone Encounter (Signed)
 Requested Prescriptions  Pending Prescriptions Disp Refills   naproxen  (NAPROSYN ) 500 MG tablet [Pharmacy Med Name: NAPROXEN  500MG  TABLETS] 180 tablet 0    Sig: TAKE 1 TABLET(500 MG) BY MOUTH TWICE DAILY WITH A MEAL     Analgesics:  NSAIDS Failed - 01/06/2024 11:43 AM      Failed - Manual Review: Labs are only required if the patient has taken medication for more than 8 weeks.      Passed - Cr in normal range and within 360 days    Creatinine  Date Value Ref Range Status  09/02/2023 0.76 0.44 - 1.00 mg/dL Final   Creat  Date Value Ref Range Status  11/28/2022 0.75 0.50 - 1.03 mg/dL Final   Creatinine, Ser  Date Value Ref Range Status  01/01/2024 0.69 0.44 - 1.00 mg/dL Final         Passed - HGB in normal range and within 360 days    Hemoglobin  Date Value Ref Range Status  12/30/2023 13.3 12.0 - 15.0 g/dL Final  97/73/7974 84.7 (H) 12.0 - 15.0 g/dL Final         Passed - PLT in normal range and within 360 days    Platelets  Date Value Ref Range Status  12/30/2023 364 150 - 400 K/uL Final   Platelet Count  Date Value Ref Range Status  08/19/2023 320 150 - 400 K/uL Final         Passed - HCT in normal range and within 360 days    HCT  Date Value Ref Range Status  12/30/2023 39.7 36.0 - 46.0 % Final         Passed - eGFR is 30 or above and within 360 days    GFR, Est African American  Date Value Ref Range Status  10/29/2016 >89 >=60 mL/min Final   GFR, Est Non African American  Date Value Ref Range Status  10/29/2016 >89 >=60 mL/min Final   GFR, Estimated  Date Value Ref Range Status  01/01/2024 >60 >60 mL/min Final    Comment:    (NOTE) Calculated using the CKD-EPI Creatinine Equation (2021)   09/02/2023 >60 >60 mL/min Final    Comment:    (NOTE) Calculated using the CKD-EPI Creatinine Equation (2021)    eGFR  Date Value Ref Range Status  11/28/2022 94 > OR = 60 mL/min/1.75m2 Final         Passed - Patient is not pregnant      Passed - Valid  encounter within last 12 months    Recent Outpatient Visits           3 months ago Adenocarcinoma of right lung [facility])   [facility] [place], Marsa PARAS, DO               buPROPion  (WELLBUTRIN  SR) 150 MG 12 hr tablet [Pharmacy Med Name: BUPROPION  SR 150MG  TABLETS (12 H)] 180 tablet 0    Sig: TAKE 1 TABLET(150 MG) BY MOUTH TWICE DAILY     Psychiatry: Antidepressants - bupropion  Passed - 01/06/2024 11:43 AM      Passed - Cr in normal range and within 360 days    Creatinine  Date Value Ref Range Status  09/02/2023 0.76 0.44 - 1.00 mg/dL Final   Creat  Date Value Ref Range Status  11/28/2022 0.75 0.50 - 1.03 mg/dL Final   Creatinine, Ser  Date Value Ref Range Status  01/01/2024 0.69 0.44 - 1.00 mg/dL Final  Passed - AST in normal range and within 360 days    AST  Date Value Ref Range Status  12/30/2023 29 15 - 41 U/L Final  08/19/2023 45 (H) 15 - 41 U/L Final         Passed - ALT in normal range and within 360 days    ALT  Date Value Ref Range Status  12/30/2023 18 0 - 44 U/L Final  08/19/2023 32 0 - 44 U/L Final         Passed - Last BP in normal range    BP Readings from Last 1 Encounters:  01/04/24 101/80         Passed - Valid encounter within last 6 months    Recent Outpatient Visits           3 months ago Adenocarcinoma of right lung [facility])   [facility] [place], Marsa PARAS, [place]

## 2024-01-07 ENCOUNTER — Ambulatory Visit (INDEPENDENT_AMBULATORY_CARE_PROVIDER_SITE_OTHER): Admitting: Student in an Organized Health Care Education/Training Program

## 2024-01-07 ENCOUNTER — Encounter: Payer: Self-pay | Admitting: Student in an Organized Health Care Education/Training Program

## 2024-01-07 VITALS — BP 100/58 | HR 101 | Temp 98.8°F | Ht 60.0 in | Wt 112.8 lb

## 2024-01-07 DIAGNOSIS — J8410 Pulmonary fibrosis, unspecified: Secondary | ICD-10-CM | POA: Diagnosis not present

## 2024-01-07 DIAGNOSIS — J432 Centrilobular emphysema: Secondary | ICD-10-CM | POA: Diagnosis not present

## 2024-01-07 DIAGNOSIS — J9601 Acute respiratory failure with hypoxia: Secondary | ICD-10-CM

## 2024-01-07 DIAGNOSIS — C3491 Malignant neoplasm of unspecified part of right bronchus or lung: Secondary | ICD-10-CM

## 2024-01-07 DIAGNOSIS — Z87891 Personal history of nicotine dependence: Secondary | ICD-10-CM

## 2024-01-07 NOTE — Progress Notes (Signed)
 Assessment & Plan:   #Pulmonary fibrosis (HCC) (Primary) #Acute Hypoxic Respiratory Failure #Centrilobular Emphysema #COPD  #PRISM   Initially presented for the evaluation of shortness of breath with findings of subpleural reticulation and emphysema on chest imaging. She had high-resolution chest CT in July 2024 showing a pattern consistent with UIP. This could represent UIP secondary to IPF but could also be due to chronic hypersensitivity pneumonitis given significant mold exposure. CPFE is also on the differential.   She was recently admitted to the hospital with acute hypoxic respiratory failure that I suspect is secondary to COPD exacerbation rather than ILD flare, though that remains on the differential. She was treated with steroids and antibiotics with improvement. Her PFT's had a ratio of 0.73, but I suspect FVC reduction from fibrosis contributes to a ratio that is inappropriately normal.   Auto-immune workup was essentially non-revealing, with only a very mildly elevated ANA of 1:40 of unclear significance but otherwise negative titers (ANA reflex, CCP, RF, myomarker). She did not undergo pulmonary resection for malignancy (see below) and we don't have a pathologic diagnosis.    I have referred the patient for a second opinion in ILD clinic with Dr. Geronimo for consideration of initiation of antifibrotics. She would not be a candidate for immune suppression at this time given her very recent diagnosis of malignancy. Today, I will order repeat TTE to assess for any pulmonary hypertension.   - ECHOCARDIOGRAM COMPLETE; Future - Continue ICS/LABA/LAMA therapy with Breztri  - Follow up in ILD clinic, visit scheduled for next week.  #Primary adenocarcinoma of right lung (HCC)   Stage IA2 RUL adenocarcinoma of the lung diagnosed following bronchoscopy and biopsy, now s/p radiation therapy (not a candidate for surgery). She is followed closely by oncology and radiation oncology. EBUS  to stations 11L, 7, 4R and 11R were negative for malignancy. Stage I-A2 (T1b, N0, M0).  Return in about 2 months (around 03/09/2024).  I spent 32 minutes caring for this patient today, including preparing to see the patient, obtaining a medical history , reviewing a separately obtained history, performing a medically appropriate examination and/or evaluation, counseling and educating the patient/family/caregiver, ordering medications, tests, or procedures, documenting clinical information in the electronic health record, and independently interpreting results (not separately reported/billed) and communicating results to the patient/family/caregiver  Belva November, MD [facility]  End of visit medications:  No orders of the defined types were placed in this encounter.    Current Outpatient Medications:    acetaminophen  (TYLENOL ) 500 MG tablet, Take 1,500 mg by mouth 3 (three) times daily. 2 to 3 times a day., Disp: , Rfl:    albuterol  (VENTOLIN  HFA) 108 (90 Base) MCG/ACT inhaler, INHALE 2 PUFFS INTO THE LUNGS EVERY 6 HOURS AS NEEDED FOR WHEEZING OR SHORTNESS OF BREATH, Disp: 6.7 g, Rfl: 5   budesonide -formoterol  (SYMBICORT ) 160-4.5 MCG/ACT inhaler, Inhale 2 puffs into the lungs daily., Disp: 1 each, Rfl: 12   buPROPion  (WELLBUTRIN  SR) 150 MG 12 hr tablet, TAKE 1 TABLET(150 MG) BY MOUTH TWICE DAILY, Disp: 180 tablet, Rfl: 0   fluticasone  (FLONASE ) 50 MCG/ACT nasal spray, Place 2 sprays into both nostrils daily., Disp: 16 g, Rfl: 0   gabapentin  (NEURONTIN ) 300 MG capsule, TAKE 1 CAPSULE(300 MG) BY MOUTH AT BEDTIME, Disp: 90 capsule, Rfl: 0   ipratropium (ATROVENT ) 0.06 % nasal spray, Place 2 sprays into both nostrils 4 (four) times daily., Disp: , Rfl:    ipratropium-albuterol  (DUONEB) 0.5-2.5 (3) MG/3ML SOLN, Take 3 mLs  by nebulization 2 (two) times daily., Disp: 360 mL, Rfl: 0   loratadine  (CLARITIN ) 10 MG tablet, Take 10 mg by mouth daily., Disp: , Rfl:    moxifloxacin  (VIGAMOX) 0.5 % ophthalmic solution, Place 1 drop into the right eye 4 (four) times daily., Disp: , Rfl:    naproxen  (NAPROSYN ) 500 MG tablet, TAKE 1 TABLET(500 MG) BY MOUTH TWICE DAILY WITH A MEAL, Disp: 180 tablet, Rfl: 0   nystatin  (MYCOSTATIN ) 100000 UNIT/ML suspension, SHAKE LIQUID AND TAKE 5 ML BY MOUTH FOUR TIMES DAILY, Disp: 60 mL, Rfl: 2   omeprazole (PRILOSEC) 20 MG capsule, Take 20 mg by mouth daily., Disp: , Rfl: 6   potassium chloride  SA (KLOR-CON  M) 20 MEQ tablet, Take 1 tablet (20 mEq total) by mouth daily., Disp: 7 tablet, Rfl: 0   prednisoLONE acetate (PRED FORTE) 1 % ophthalmic suspension, Place 1 drop into the right eye 4 (four) times daily., Disp: , Rfl:    predniSONE  (DELTASONE ) 10 MG tablet, Take 4 tablets (40 mg total) by mouth daily with breakfast for 3 days, THEN 3 tablets (30 mg total) daily with breakfast for 3 days, THEN 2 tablets (20 mg total) daily with breakfast for 3 days, THEN 1 tablet (10 mg total) daily with breakfast for 3 days., Disp: 30 tablet, Rfl: 0   Tiotropium Bromide  Monohydrate (SPIRIVA  RESPIMAT) 2.5 MCG/ACT AERS, Inhale 2 puffs into the lungs daily., Disp: 4 g, Rfl: 12   Subjective:   PATIENT ID: Joy Ball GENDER: female DOB: 19-Jun-1966, MRN: [id number]  Chief Complaint  Patient presents with   Hospitalization Follow-up    Still more sob than normal.  Oxygen is new, 4L with exertion and 1L at HS.    HPI  Patient is a pleasant 58 year old female presenting for post hospital discharge follow up.   Patient has been following in our clinic for ILD as well as a lung nodule. She underwent robotic assisted navigational bronchoscopy for biopsy of the RUL nodule in addition to EBUS/TBNA of the hilar and mediastinal lymph nodes on 08/11/2023. Biopsy of the RUL nodule has shown adenocarcinoma, while EBUS to stations 11L, 7, 4R, and 11R superior was negative for malignancy. She has since been seen by oncology as well as by radiation oncology, and has  initiated on radiation therapy of the nodule. She was seen by thoracic surgery and felt high risk candidate for surgery, so lobectomy was deferred.   She'd been seen in 2017 by Dr. Linard, and prior to that by Dr. McQuaid. She had followed with them for both COPD and pulmonary fibrosis. Previous workup has included an auto-immune workup in 2013 that was negative, as well as an alpha -1 genotype that was normal. Her chest CT was from 2016 and showed bilateral lower lobe basilar reticulation. It was also notable for centrilobular emphysema.  She then established care with me in June 2024, I ordered a high-resolution chest CT that was obtained outside of our system with images ported and report noting signs of UIP as well as a potential pulmonary nodule on my review. We were in the process of obtaining a PET/CT to assess for risk of malignancy but this was delayed given insurance coverage and attempt to transition care.    Patient attempted to transition care to [facility] secondary to insurance coverage issues, but this was difficult. Following change of insurance and difficulty in transition, she re-established in our clinic in January of 2025. I ordered repeat imaging including a PET/CT and repeat  chest CT showing growth in the RUL nodule as well as FDG avidity on PET.   Return Visit 11/27/2023: she continues to experience exertional dyspnea and a mild cough. She is compliant with her Breztri  and is using it as prescribed without any issue. She continues to be exposed to mold at her partner's house.  Admission 12/24/2023: She was admitted 7/3-7/11 to [facility] with acute hypoxic respiratory failure, requiring significant oxygen supplementation with high flow nasal cannula. She was treated with antibiotics as well as with steroids.  CT chest during hospitalization showed extensive emphysema, as well as areas of ground glass in the RML and bilateral lower lobes, suggestive of infection. The CT was negative for  clot.  Return Visit 01/07/2024: Presents for post hospital discharge. She continues on a steroid taper post discharge. She feels improved compared to prior, but is not back to her baseline yet. Denies worsening cough or sputum production, and denies wheeze.   She is a former smoker, and has been smoke-free since September 2024. She previously smoked between 1.5 and 2 packs a day.  She continues to live in the same house with her boyfriend where there is report of mold in the bedroom (water from under the bedroom).  Ancillary information including prior medications, full medical/surgical/family/social histories, and PFTs (when available) are listed below and have been reviewed.   Review of Systems  Constitutional:  Negative for chills, fever and weight loss.  Respiratory:  Positive for shortness of breath (with exertion). Negative for cough, hemoptysis and wheezing.   Cardiovascular:  Negative for chest pain.     Objective:   Vitals:   01/07/24 1602  BP: (!) 100/58  Pulse: (!) 101  Temp: 98.8 F (37.1 C)  TempSrc: Oral  SpO2: 95%  Weight: 112 lb 12.8 oz (51.2 kg)  Height: 5' (1.524 m)   95% on 2 LPM  BMI Readings from Last 3 Encounters:  01/07/24 22.03 kg/m  12/25/23 22.46 kg/m  11/27/23 21.73 kg/m   Wt Readings from Last 3 Encounters:  01/07/24 112 lb 12.8 oz (51.2 kg)  12/25/23 115 lb (52.2 kg)  11/27/23 115 lb (52.2 kg)    Physical Exam Constitutional:      Appearance: Normal appearance.  Cardiovascular:     Rate and Rhythm: Normal rate and regular rhythm.     Pulses: Normal pulses.     Heart sounds: Normal heart sounds.  Pulmonary:     Breath sounds: Rales (bibasilar) present. No wheezing or rhonchi.  Abdominal:     Palpations: Abdomen is soft.  Neurological:     General: No focal deficit present.     Mental Status: She is alert and oriented to person, place, and time. Mental status is at baseline.       Ancillary Information    Past Medical  History:  Diagnosis Date   Asthma    Cancer (HCC)    COPD (chronic obstructive pulmonary disease) (HCC)    Dyspnea    Emphysema lung (HCC)    GERD (gastroesophageal reflux disease)    Lung nodule    Migraines    Pulmonary fibrosis (HCC)    Seasonal allergies      Family History  Problem Relation Age of Onset   Lung cancer Father        was a smoker   Heart disease Maternal Grandfather    Emphysema Mother      Past Surgical History:  Procedure Laterality Date   CESAREAN SECTION  EYE SURGERY Bilateral 01/2023   TUBAL LIGATION      Social History   Socioeconomic History   Marital status: Widowed    Spouse name: Not on file   Number of children: 3   Years of education: Not on file   Highest education level: Some college, no degree  Occupational History   Occupation: Lexicographer houses    Employer: OTHER  Tobacco Use   Smoking status: Former    Current packs/day: 0.00    Average packs/day: 1.5 packs/day for 81.7 years (121.3 ttl pk-yrs)    Types: Cigarettes    Start date: 32    Quit date: 02/22/2023    Years since quitting: 0.8   Smokeless tobacco: Current   Tobacco comments:    Stopped in September of 2024 .hfb RN  Substance and Sexual Activity   Alcohol use: Yes    Alcohol/week: 0.0 standard drinks of alcohol    Comment: occ   Drug use: No   Sexual activity: Not on file  Other Topics Concern   Not on file  Social History Narrative   Not on file   Social Drivers of Health   Financial Resource Strain: Not on file  Food Insecurity: Food Insecurity Present (12/25/2023)   Hunger Vital Sign    Worried About Running Out of Food in the Last Year: Sometimes true    Ran Out of Food in the Last Year: Sometimes true  Transportation Needs: No Transportation Needs (12/25/2023)   PRAPARE - Administrator, Civil Service (Medical): No    Lack of Transportation (Non-Medical): No  Physical Activity: Not on file  Stress: Not on file  Social Connections:  Unknown (12/22/2022)   [facility] from [facility]   Social Network    Social Network: Not on file  Intimate Partner Violence: Not At Risk (12/25/2023)   Humiliation, Afraid, Rape, and Kick questionnaire    Fear of Current or Ex-Partner: No    Emotionally Abused: No    Physically Abused: No    Sexually Abused: No     Allergies  Allergen Reactions   Amoxicillin Hives     CBC    Component Value Date/Time   WBC 8.0 12/30/2023 0417   RBC 3.66 (L) 12/30/2023 0417   HGB 13.3 12/30/2023 0417   HGB 15.2 (H) 08/19/2023 1611   HCT 39.7 12/30/2023 0417   PLT 364 12/30/2023 0417   PLT 320 08/19/2023 1611   MCV 108.5 (H) 12/30/2023 0417   MCH 36.3 (H) 12/30/2023 0417   MCHC 33.5 12/30/2023 0417   RDW 13.0 12/30/2023 0417   LYMPHSABS 2.0 12/24/2023 2147   MONOABS 0.9 12/24/2023 2147   EOSABS 0.1 12/24/2023 2147   BASOSABS 0.1 12/24/2023 2147    Pulmonary Functions Testing Results:    Latest Ref Rng & Units 11/23/2023   10:45 AM 08/31/2023    9:59 AM 01/01/2023    2:22 PM  PFT Results  FVC-Pre L 2.17  2.25  2.15   FVC-Predicted Pre % 70  73  69   FVC-Post L  2.26  2.37   FVC-Predicted Post %  73  77   Pre FEV1/FVC % % 73  68  66   Post FEV1/FCV % %  73  71   FEV1-Pre L 1.59  1.53  1.42   FEV1-Predicted Pre % 66  64  58   FEV1-Post L  1.65  1.67   DLCO uncorrected ml/min/mmHg 7.27  6.65  5.35   DLCO  [facility]% % 39  35  28   DLVA Predicted % 55  47  33   TLC L 3.55  3.47  3.17   TLC % Predicted % 77  75  68   RV % Predicted % 80  69  69     Outpatient Medications Prior to Visit  Medication Sig Dispense Refill   acetaminophen  (TYLENOL ) 500 MG tablet Take 1,500 mg by mouth 3 (three) times daily. 2 to 3 times a day.     albuterol  (VENTOLIN  HFA) 108 (90 Base) MCG/ACT inhaler INHALE 2 PUFFS INTO THE LUNGS EVERY 6 HOURS AS NEEDED FOR WHEEZING OR SHORTNESS OF BREATH 6.7 g 5   budesonide -formoterol  (SYMBICORT ) 160-4.5 MCG/ACT inhaler Inhale 2 puffs into the lungs daily. 1 each 12    buPROPion  (WELLBUTRIN  SR) 150 MG 12 hr tablet TAKE 1 TABLET(150 MG) BY MOUTH TWICE DAILY 180 tablet 0   fluticasone  (FLONASE ) 50 MCG/ACT nasal spray Place 2 sprays into both nostrils daily. 16 g 0   gabapentin  (NEURONTIN ) 300 MG capsule TAKE 1 CAPSULE(300 MG) BY MOUTH AT BEDTIME 90 capsule 0   ipratropium (ATROVENT ) 0.06 % nasal spray Place 2 sprays into both nostrils 4 (four) times daily.     ipratropium-albuterol  (DUONEB) 0.5-2.5 (3) MG/3ML SOLN Take 3 mLs by nebulization 2 (two) times daily. 360 mL 0   loratadine  (CLARITIN ) 10 MG tablet Take 10 mg by mouth daily.     moxifloxacin (VIGAMOX) 0.5 % ophthalmic solution Place 1 drop into the right eye 4 (four) times daily.     naproxen  (NAPROSYN ) 500 MG tablet TAKE 1 TABLET(500 MG) BY MOUTH TWICE DAILY WITH A MEAL 180 tablet 0   nystatin  (MYCOSTATIN ) 100000 UNIT/ML suspension SHAKE LIQUID AND TAKE 5 ML BY MOUTH FOUR TIMES DAILY 60 mL 2   omeprazole (PRILOSEC) 20 MG capsule Take 20 mg by mouth daily.  6   potassium chloride  SA (KLOR-CON  M) 20 MEQ tablet Take 1 tablet (20 mEq total) by mouth daily. 7 tablet 0   prednisoLONE acetate (PRED FORTE) 1 % ophthalmic suspension Place 1 drop into the right eye 4 (four) times daily.     predniSONE  (DELTASONE ) 10 MG tablet Take 4 tablets (40 mg total) by mouth daily with breakfast for 3 days, THEN 3 tablets (30 mg total) daily with breakfast for 3 days, THEN 2 tablets (20 mg total) daily with breakfast for 3 days, THEN 1 tablet (10 mg total) daily with breakfast for 3 days. 30 tablet 0   Tiotropium Bromide  Monohydrate (SPIRIVA  RESPIMAT) 2.5 MCG/ACT AERS Inhale 2 puffs into the lungs daily. 4 g 12   No facility-administered medications prior to visit.

## 2024-01-08 ENCOUNTER — Other Ambulatory Visit: Payer: Self-pay | Admitting: Family Medicine

## 2024-01-08 DIAGNOSIS — G8929 Other chronic pain: Secondary | ICD-10-CM

## 2024-01-08 DIAGNOSIS — M7712 Lateral epicondylitis, left elbow: Secondary | ICD-10-CM

## 2024-01-11 NOTE — Telephone Encounter (Signed)
 Requested Prescriptions  Pending Prescriptions Disp Refills   gabapentin  (NEURONTIN ) 300 MG capsule [Pharmacy Med Name: GABAPENTIN  300MG  CAPSULES] 90 capsule 0    Sig: TAKE 1 CAPSULE(300 MG) BY MOUTH AT BEDTIME     Neurology: Anticonvulsants - gabapentin  Passed - 01/11/2024 11:18 AM      Passed - Cr in normal range and within 360 days    Creatinine  Date Value Ref Range Status  09/02/2023 0.76 0.44 - 1.00 mg/dL Final   Creat  Date Value Ref Range Status  11/28/2022 0.75 0.50 - 1.03 mg/dL Final   Creatinine, Ser  Date Value Ref Range Status  01/01/2024 0.69 0.44 - 1.00 mg/dL Final         Passed - Completed PHQ-2 or PHQ-9 in the last 360 days      Passed - Valid encounter within last 12 months    Recent Outpatient Visits           3 months ago Adenocarcinoma of right lung [facility])   [facility] [place], Marsa PARAS, DO

## 2024-01-12 NOTE — Progress Notes (Unsigned)
 January 07, 2024 with Dr. KD in [place] pulmonary  #Pulmonary fibrosis [facility]) (Primary) #Acute Hypoxic Respiratory Failure #Centrilobular Emphysema #COPD  #PRISM   Initially presented for the evaluation of shortness of breath with findings of subpleural reticulation and emphysema on chest imaging. She had high-resolution chest CT in July 2024 showing a pattern consistent with UIP. This could represent UIP secondary to IPF but could also be due to chronic hypersensitivity pneumonitis given significant mold exposure. CPFE is also on the differential.   She was recently admitted to the hospital with acute hypoxic respiratory failure that I suspect is secondary to COPD exacerbation rather than ILD flare, though that remains on the differential. She was treated with steroids and antibiotics with improvement. Her PFT's had a ratio of 0.73, but I suspect FVC reduction from fibrosis contributes to a ratio that is inappropriately normal.   Auto-immune workup was essentially non-revealing, with only a very mildly elevated ANA of 1:40 of unclear significance but otherwise negative titers (ANA reflex, CCP, RF, myomarker). She did not undergo pulmonary resection for malignancy (see below) and we don't have a pathologic diagnosis.    I have referred the patient for a second opinion in ILD clinic with Dr. Geronimo for consideration of initiation of antifibrotics. She would not be a candidate for immune suppression at this time given her very recent diagnosis of malignancy. Today, I will order repeat TTE to assess for any pulmonary hypertension.   - ECHOCARDIOGRAM COMPLETE; Future - Continue ICS/LABA/LAMA therapy with Breztri  - Follow up in ILD clinic, visit scheduled for next week.  #Primary adenocarcinoma of right lung (HCC)   Stage IA2 RUL adenocarcinoma of the lung diagnosed following bronchoscopy and biopsy, now s/p radiation therapy (not a candidate for surgery). She is followed closely by oncology  and radiation oncology. EBUS to stations 11L, 7, 4R and 11R were negative for malignancy. Stage I-A2 (T1b, N0, M0).   OV 01/14/2024 -  Evaluation of interstitial lung disease center by Dr. Geronimo.  Referred by Dr. Belva Fake of [place] [facility].  Subjective:  Patient ID: Joy Ball, female , DOB: 1965-11-27 , age 58 y.o. , MRN: [id number] , ADDRESS: [address] [place] [address] PCP Edman Marsa PARAS, DO Patient Care Team: Edman Marsa PARAS, DO as PCP - General (Family Medicine) Isadora Belva, MD as Consulting Physician (Pulmonary Disease) Verdene Gills, RN as Oncology Nurse Navigator Babara Call, MD as Consulting Physician (Oncology)  This Provider for this visit: Treatment Team:  Attending Provider: Geronimo Amel, MD    01/14/2024 -   Chief Complaint  Patient presents with   Medical Management of Chronic Issues   Interstitial Lung Disease    [name]. She gets light headed and SOB walking to room to room at home. Sats dropping into 60's on o2 4lpm with minimal exertion.      HPI Joy Ball 58 y.o. -presents with her neighbor and granddaughter.  She is a former heavy smoker quit in September 2024.  She says she has been aware that she has had pulmonary fibrosis/interstitial lung disease since at least 2013.  She says possibly Dr. Creston took care of and the approach was supportive care.  She has a strong family history of pulmonary fibrosis including her maternal grandmother, her sister was 57 and her sister is 57 years old.  The 1/62 is now on oxygen.  For years has been observation therapy.  It looks like she underwent regular CT scans and in early  2025 got diagnosed with a new nodule and then she underwent radiation for stage I non-small cell lung cancer.  She is completed radiation and April 2025.  Review of images indicate this ILD was likely stable but then started progressing by the summer 2024 and then a significant  progression by early 2025.  The pattern of ILD was initially indeterminate but now is probable UIP/possibly definitive UIP with early honeycombing.    Then after the radiation she got hospitalized for acute on chronic hypoxemic respiratory failure deemed this is COPD exacerbation but CT angiogram images from July 2025 show groundglass opacities and since then she has got persistent new hypoxemia even at rest.  Therefore she possibly had a ILD flareup probably related to radiation or virus.  She was not tested extensively with the respiratory virus panel of note.  She is using 4 L oxygen at rest at home but today she is 82% room air at rest and corrects to 91% with 2 L at rest.  Her serology panel is essentially negative except for trace positive ANA.  An echocardiogram is pending CT scan of the chest also shows coronary artery calcification but she denies any chest pain.  Recent troponin was normal  In terms of exposure history she lives in 57 year old house.  She has been living there for 7 years.  She says she can smell mold in the house itself.  In particular there is 1 bedroom where there is mold in the crawlspace but she says when she lifts her shoes or any furniture that she can see mold even at the carpet level.    SYMPTOM SCALE - ILD 01/14/2024  Current weight   O2 use RA 82% at rest, 2L 91%, she has bee using 4  Shortness of Breath 0 -> 5 scale with 5 being worst (score 6 If unable to do)  At rest 1  Simple tasks - showers, clothes change, eating, shaving 5  Household (dishes, doing bed, laundry) 5  Shopping Does not do  Walking level at own pace 5  Walking up Stairs 5  Total (30-36) Dyspnea Score 21  How bad is your cough? 1  How bad is your fatigue 5  How bad is appetiee 0  How bad is nausea 00  How bad is vomiting?  0  How bad is diarrhea? 0  How bad is anxiety? 0  How bad is depression 0  Any chronic pain - if so where and how bad 00       CT Chest data from  date: July 31, 2023  - personally visualized and independently interpreted : yes - my findings are: yes agree  IMPRESSION: 1. The 1.9 cm right upper lobe nodule along the inferior margin of the of the right upper lobe scarring is hypermetabolic on today's PET-CT, concerning for primary lung malignancy, or less likely active granulomatous process. 2. Extensive centrilobular emphysema. 3. Fibrosis and mild honeycombing in the lung bases, UIP pattern. 4. Hepatic steatosis. 5. Aortic and coronary atherosclerosis.   Aortic Atherosclerosis (ICD10-I70.0) and Emphysema (ICD10-J43.9).     Electronically Signed   By: Ryan Salvage M.D.   On: 08/12/2023 15:02    PFT     Latest Ref Rng & Units 11/23/2023   10:45 AM 08/31/2023    9:59 AM 01/01/2023    2:22 PM  PFT Results  FVC-Pre L 2.17  2.25  2.15   FVC-Predicted Pre % 70  73  69   FVC-Post L  2.26  2.37   FVC-Predicted Post %  73  77   Pre FEV1/FVC % % 73  68  66   Post FEV1/FCV % %  73  71   FEV1-Pre L 1.59  1.53  1.42   FEV1-Predicted Pre % 66  64  58   FEV1-Post L  1.65  1.67   DLCO uncorrected ml/min/mmHg 7.27  6.65  5.35   DLCO UNC% % 39  35  28   DLVA Predicted % 55  47  33   TLC L 3.55  3.47  3.17   TLC % Predicted % 77  75  68   RV % Predicted % 80  69  69      Latest Reference Range & Units 12/11/22 15:41  RA Latex Turbid. <14.0 IU/mL 12.4  Speckled Pattern <1:40  1:40 (H)  Anti-Nuclear Ab by IFA (RDL) Negative  Positive !  Note:  Comment  Anti-Ro (SS-A) Ab (RDL) <20 Units <20  Anti-CCP Ab, IgG + IgA (RDL) <20 Units <20  Anti-Jo-1 Ab (RDL) <20 Units <20  Anti-PL-7 Ab (RDL) Negative  Negative  Anti-PL-12 Ab (RDL) Negative  Negative  Anti-EJ Ab (RDL) Negative  Negative  Anti-OJ Ab (RDL) Negative  Negative  Anti-SRP Ab (RDL) Negative  Negative  Anti-Mi-2 Ab (RDL) Negative  Negative  Anti-TIF-1gamma Ab (RDL) <20 Units <20  Anti-MDA-5 Ab (CADM-140)(RDL) <20 Units <20  Anti-NXP-2 (P140) Ab (RDL) <20 Units  <20  Anti-SAE1 Ab, IgG (RDL) <20 Units <20  Anti-PM/Scl-100 Ab (RDL) <20 Units <20  Anti-Ku Ab (RDL) Negative  Negative  Anti-SS-A 52kD Ab, IgG (RDL) <20 Units <20  Anti-U1 RNP Ab (RDL) <20 Units <20  Anti-U2 RNP Ab (RDL) Negative  Negative  Anti-U3 RNP (Fibrillarin)(RDL) Negative  Negative  ANA Plus 12 Interpretation  Comment  (H): Data is abnormally high !: Data is abnormal   LAB RESULTS last 96 hours No results found.       has a past medical history of Allergy (Idk), Arthritis (Idk), Asthma, Cancer (HCC), Cataract (Idk), COPD (chronic obstructive pulmonary disease) (HCC), Dyspnea, Emphysema lung (HCC), Emphysema of lung (HCC) (Idk), GERD (gastroesophageal reflux disease), Lung cancer (HCC) (2024), Lung nodule, Migraines, Pulmonary fibrosis (HCC), and Seasonal allergies.   reports that she quit smoking about 10 months ago. Her smoking use included cigarettes. She started smoking about 40 years ago. She has a 121.3 Ball-year smoking history. She uses smokeless tobacco.  Past Surgical History:  Procedure Laterality Date   CESAREAN SECTION     EYE SURGERY Bilateral 01/2023   TUBAL LIGATION      Allergies  Allergen Reactions   Amoxicillin Hives    Immunization History  Administered Date(s) Administered   Influenza,inj,Quad PF,6+ Mos 07/27/2015   Influenza-Unspecified 02/24/2014    Family History  Problem Relation Age of Onset   Lung cancer Father        was a smoker   Cancer Father    Heart disease Maternal Grandfather    Emphysema Mother    Arthritis Mother    Asthma Mother    COPD Mother    Vision loss Mother    Varicose Veins Mother    Miscarriages / [place] Sister    Miscarriages / [place] Sister      Current Outpatient Medications:    acetaminophen  (TYLENOL ) 500 MG tablet, Take 1,500 mg by mouth 3 (three) times daily. 2 to 3 times a day., Disp: , Rfl:    albuterol  (VENTOLIN  HFA) 108 (90  Base) MCG/ACT inhaler, INHALE 2 PUFFS INTO THE LUNGS EVERY  6 HOURS AS NEEDED FOR WHEEZING OR SHORTNESS OF BREATH, Disp: 6.7 g, Rfl: 5   budesonide -formoterol  (SYMBICORT ) 160-4.5 MCG/ACT inhaler, Inhale 2 puffs into the lungs daily., Disp: 1 each, Rfl: 12   buPROPion  (WELLBUTRIN  SR) 150 MG 12 hr tablet, TAKE 1 TABLET(150 MG) BY MOUTH TWICE DAILY, Disp: 180 tablet, Rfl: 0   fluticasone  (FLONASE ) 50 MCG/ACT nasal spray, Place 2 sprays into both nostrils daily., Disp: 16 g, Rfl: 0   gabapentin  (NEURONTIN ) 300 MG capsule, TAKE 1 CAPSULE(300 MG) BY MOUTH AT BEDTIME, Disp: 90 capsule, Rfl: 0   ipratropium (ATROVENT ) 0.06 % nasal spray, Place 2 sprays into both nostrils 4 (four) times daily., Disp: , Rfl:    ipratropium-albuterol  (DUONEB) 0.5-2.5 (3) MG/3ML SOLN, Take 3 mLs by nebulization 2 (two) times daily., Disp: 360 mL, Rfl: 0   loratadine  (CLARITIN ) 10 MG tablet, Take 10 mg by mouth daily., Disp: , Rfl:    naproxen  (NAPROSYN ) 500 MG tablet, TAKE 1 TABLET(500 MG) BY MOUTH TWICE DAILY WITH A MEAL, Disp: 180 tablet, Rfl: 0   nystatin  (MYCOSTATIN ) 100000 UNIT/ML suspension, SHAKE LIQUID AND TAKE 5 ML BY MOUTH FOUR TIMES DAILY, Disp: 60 mL, Rfl: 2   omeprazole (PRILOSEC) 20 MG capsule, Take 20 mg by mouth daily., Disp: , Rfl: 6   predniSONE  (DELTASONE ) 10 MG tablet, Take 4 tablets (40 mg total) by mouth daily with breakfast for 3 days, THEN 3 tablets (30 mg total) daily with breakfast for 3 days, THEN 2 tablets (20 mg total) daily with breakfast for 3 days, THEN 1 tablet (10 mg total) daily with breakfast for 3 days., Disp: 30 tablet, Rfl: 0   Tiotropium Bromide  Monohydrate (SPIRIVA  RESPIMAT) 2.5 MCG/ACT AERS, Inhale 2 puffs into the lungs daily., Disp: 4 g, Rfl: 12   moxifloxacin (VIGAMOX) 0.5 % ophthalmic solution, Place 1 drop into the right eye 4 (four) times daily. (Patient not taking: Reported on 01/13/2024), Disp: , Rfl:    potassium chloride  SA (KLOR-CON  M) 20 MEQ tablet, Take 1 tablet (20 mEq total) by mouth daily., Disp: 7 tablet, Rfl: 0   prednisoLONE  acetate (PRED FORTE) 1 % ophthalmic suspension, Place 1 drop into the right eye 4 (four) times daily. (Patient not taking: Reported on 01/13/2024), Disp: , Rfl:       Objective:   Vitals:   01/13/24 0949  BP: (!) 90/58  Pulse: 94  SpO2: 98%  Weight: 125 lb (56.7 kg)  Height: 5' (1.524 m)    Estimated body mass index is 24.41 kg/m as calculated from the following:   Height as of this encounter: 5' (1.524 m).   Weight as of this encounter: 125 lb (56.7 kg).  @WEIGHTCHANGE @  Filed Weights   01/13/24 0949  Weight: 125 lb (56.7 kg)     Physical Exam   General: No distress. lean O2 at rest: yes Cane present: no Sitting in wheel chair: no Frail: no Obese: non Neuro: Alert and Oriented x 3. GCS 15. Speech normal Psych: Pleasant Resp:  Barrel Chest - ono.  Wheeze - no, Crackles - yes, No overt respiratory distress CVS: Normal heart sounds. Murmurs - no Ext: Stigmata of Connective Tissue Disease - no HEENT: Normal upper airway. PEERL +. No post nasal drip        Assessment/PLAN     Assessment & Plan IPF (idiopathic pulmonary fibrosis) (HCC)  History of therapeutic radiation  Family history of pulmonary fibrosis  Encounter for therapeutic drug monitoring  Adenocarcinoma of right lung (HCC)  Pulmonary emphysema, unspecified emphysema type (HCC)  Anxiety about health  Mold exposure  Acute respiratory failure with hypoxia (HCC)  Chronic respiratory failure with hypoxia (HCC)  Coronary artery calcification seen on CAT scan   Patient Instructions  IPF (idiopathic pulmonary fibrosis) (HCC) History of therapeutic radiation Family history of pulmonary fibrosis Mold exposure Encounter for therapeutic drug monitoring   - Though there is mold exposure at home and giving her diagnosis of idiopathic pulmonary fibrosis [IPF] based on the fact there is strong family history, lower lobe predominant disease that has progressed.  Recent radiation and ongoing mold  exposure and possible viral issues in the summer 2025 have resulted in progression of disease.  I believe progression happened between July 2024 and February 2025 and accelerated much more after hospitalization in July 2025.  -You are now at a point requiring oxygen at rest  Plan - Start pirfenidone [discussed the other drug nintedanib but overall recommend pirfenidone given side effect profile and choices] - At some point will refer to genetics counselor and discuss other pillars and treatment - Refer to pulmonary rehabilitation at [facility] - Take ILD questionnaire and fill it up and bring it back with you next time - At next visit we will figure out schedule for future PFT  Adenocarcinoma of right lung [facility]) stage I-status post radiation spring 2025  Plan - According to radiation oncology   Pulmonary emphysema, unspecified emphysema type (HCC)  Plan  - Continue Spiriva  and Symbicort  schedule - Continue albuterol  as needed - Refer to pulmonary rehabilitation  Acute respiratory failure with hypoxia (HCC) Chronic respiratory failure with hypoxia (HCC)  -contnue o2 2L [place] at rest and 4-6L With exertiona  Anxiety about health   Plan  - talk to  PCPT Karamalegos, Marsa PARAS, DO - but also refer pulmnary rehab a [place]  Coronary artery calcification on CT scan  Plan - Await echocardiogram results previously ordered - Refer to cardiology in [facility]  Follow-up - 6 weeks with nurse practitioner to figure out pirfenidone uptake [can be in [place]] - 12 weeks with Dr. Geronimo 30-minute visit   Esbriet/Pirfenidone requires intensive drug monitoring due to high concerns for Adverse effects of , including  Drug Induced Liver Injury, significant GI side effects that include but not limited to Diarrhea, Nausea, Vomiting,  and other system side effects that include Fatigue, headaches, weight loss and other side effects such as skin rash. These will be monitored with  blood work  such as LFT initially once a month for 6 months and then quarterly    FOLLOWUP    Return for 6 weeks with nurse practitioner in 12 weeks with Dr. Geronimo; both 30-minute visits.    SIGNATURE    Dr. Dorethia Geronimo, M.D., F.C.C.P,  Pulmonary and Critical Care Medicine Staff Physician, [facility] Center Director - Interstitial Lung Disease  Program  Pulmonary Fibrosis [facility] at [facility] [place], [place], [address]  Pager: [phone], If no answer or between  15:00h - 7:00h: call [phone] Telephone: [phone]  9:44 AM 01/14/2024

## 2024-01-13 ENCOUNTER — Telehealth: Payer: Self-pay

## 2024-01-13 ENCOUNTER — Ambulatory Visit: Admitting: Internal Medicine

## 2024-01-13 ENCOUNTER — Telehealth: Payer: Self-pay | Admitting: Internal Medicine

## 2024-01-13 ENCOUNTER — Other Ambulatory Visit (HOSPITAL_COMMUNITY): Payer: Self-pay

## 2024-01-13 ENCOUNTER — Encounter: Payer: Self-pay | Admitting: Internal Medicine

## 2024-01-13 VITALS — BP 90/58 | HR 94 | Ht 60.0 in | Wt 125.0 lb

## 2024-01-13 DIAGNOSIS — C3491 Malignant neoplasm of unspecified part of right bronchus or lung: Secondary | ICD-10-CM | POA: Diagnosis not present

## 2024-01-13 DIAGNOSIS — R4589 Other symptoms and signs involving emotional state: Secondary | ICD-10-CM

## 2024-01-13 DIAGNOSIS — J9611 Chronic respiratory failure with hypoxia: Secondary | ICD-10-CM

## 2024-01-13 DIAGNOSIS — Z5181 Encounter for therapeutic drug level monitoring: Secondary | ICD-10-CM

## 2024-01-13 DIAGNOSIS — Z923 Personal history of irradiation: Secondary | ICD-10-CM

## 2024-01-13 DIAGNOSIS — I2510 Atherosclerotic heart disease of native coronary artery without angina pectoris: Secondary | ICD-10-CM

## 2024-01-13 DIAGNOSIS — J9601 Acute respiratory failure with hypoxia: Secondary | ICD-10-CM

## 2024-01-13 DIAGNOSIS — Z836 Family history of other diseases of the respiratory system: Secondary | ICD-10-CM | POA: Diagnosis not present

## 2024-01-13 DIAGNOSIS — J441 Chronic obstructive pulmonary disease with (acute) exacerbation: Secondary | ICD-10-CM

## 2024-01-13 DIAGNOSIS — J439 Emphysema, unspecified: Secondary | ICD-10-CM | POA: Diagnosis not present

## 2024-01-13 DIAGNOSIS — J84112 Idiopathic pulmonary fibrosis: Secondary | ICD-10-CM | POA: Diagnosis not present

## 2024-01-13 DIAGNOSIS — Z77120 Contact with and (suspected) exposure to mold (toxic): Secondary | ICD-10-CM | POA: Diagnosis not present

## 2024-01-13 NOTE — Telephone Encounter (Signed)
 Joy Ball/[name]  [name] - new Esbriet start

## 2024-01-13 NOTE — Patient Instructions (Addendum)
 IPF (idiopathic pulmonary fibrosis) (HCC) History of therapeutic radiation Family history of pulmonary fibrosis Mold exposure Encounter for therapeutic drug monitoring   - Though there is mold exposure at home and giving her diagnosis of idiopathic pulmonary fibrosis [IPF] based on the fact there is strong family history, lower lobe predominant disease that has progressed.  Recent radiation and ongoing mold exposure and possible viral issues in the summer 2025 have resulted in progression of disease.  I believe progression happened between July 2024 and February 2025 and accelerated much more after hospitalization in July 2025.  -You are now at a point requiring oxygen at rest  Plan - Start pirfenidone [discussed the other drug nintedanib but overall recommend pirfenidone given side effect profile and choices] - At some point will refer to genetics counselor and discuss other pillars and treatment - Refer to pulmonary rehabilitation at [facility] - Take ILD questionnaire and fill it up and bring it back with you next time - At next visit we will figure out schedule for future PFT  Adenocarcinoma of right lung [facility]) stage I-status post radiation spring 2025  Plan - According to radiation oncology   Pulmonary emphysema, unspecified emphysema type (HCC)  Plan  - Continue Spiriva  and Symbicort  schedule - Continue albuterol  as needed - Refer to pulmonary rehabilitation  Acute respiratory failure with hypoxia (HCC) Chronic respiratory failure with hypoxia (HCC)  -contnue o2 2L [place] at rest and 4-6L With exertiona  Anxiety about health   Plan  - talk to  PCPT Karamalegos, Marsa PARAS, DO - but also refer pulmnary rehab a [place]  Coronary artery calcification on CT scan  Plan - Await echocardiogram results previously ordered - Refer to cardiology in [facility]  Follow-up - 6 weeks with nurse practitioner to figure out pirfenidone uptake [can be in [place]] - 12 weeks with Dr.  Geronimo 30-minute visit

## 2024-01-13 NOTE — Telephone Encounter (Signed)
 Error

## 2024-01-13 NOTE — Telephone Encounter (Signed)
 Joy Amel, MD 01/13/24 10:31 AM Note Joy Ball/Joy   Lenore S Ball - new Esbriet start     Attempted to submit a PA request to [facility], however I received a notification that a PA is not required. Confirmed with a test claim that medication is already covered and pt's copay is $4. [facility].  Will route to Joy Ball for clinical f/u.

## 2024-01-13 NOTE — Telephone Encounter (Signed)
 Noted, BIV completed in separate encounter

## 2024-01-13 NOTE — Telephone Encounter (Signed)
 Copied from CRM [id number]. Topic: Clinical - Prescription Issue >> Jan 13, 2024 11:39 AM Corean SAUNDERS wrote: Reason for CRM: Patient had an appointment with Dr. Rosamaria today 7/23 and was advised by him that he is going to order her more prednisone  but has not done so yet and patient is just calling to let him know to please send it to her preferred pharmacy: [id number] - [place], [place] - [address] AT [facility] [address]  [facility] and spoke with the patient. Pt states Dr. Geronimo was going to call in Prednisone . I do not see Prednisone  listed anywhere in AVS.  I asked pt if she had got Prednisone  and Pirfenidone mixed up and she said no.   Please advise Dr. Geronimo patient was seen at the clinic today.

## 2024-01-14 NOTE — Telephone Encounter (Signed)
 No I do not recall telling her that I will give her prednisone .  The conversation was about pirfenidone.  However I want to respect what the patient says and assume best of intent.  So I decided to do a prednisone  taper but I already see a prednisone  taper from January 05, 2019 5 running through January 17, 2024.  Not sure who made that  Let me know

## 2024-01-15 MED ORDER — PREDNISONE 10 MG PO TABS
ORAL_TABLET | ORAL | 0 refills | Status: DC
Start: 2024-01-15 — End: 2024-04-07

## 2024-01-15 MED ORDER — PREDNISONE 10 MG PO TABS: ORAL_TABLET | ORAL | 0 refills | Status: DC

## 2024-01-15 NOTE — Telephone Encounter (Signed)
 I called and spoke with the pt and notified of response per MR. She verbalized understanding. Nothing further needed. Rx was sent to pharm.

## 2024-01-15 NOTE — Telephone Encounter (Signed)
 Ok I recall that now. Please apologize on my behalf. As you know that day was super busy and lot of details  Ok will send another round   Please take Take prednisone  40mg  once daily x 3 days, then 30mg  once daily x 3 days, then 20mg  once daily x 3 days, then prednisone  10mg  once daily  x 3 days and stop

## 2024-01-15 NOTE — Telephone Encounter (Signed)
 Spoke with the pt  She states that she is tapering off pred now  She does not feel it helped with her symptoms  She recalls MR stating I might want to try another round She is ok either way Will route back to MR

## 2024-01-20 NOTE — Telephone Encounter (Signed)
 Called patient regarding Pirfenidone counseling. Unable to reach. Left voicemail. Advised patient to give us  a call back to discuss Pirfenidone prescription. Provided with callback number.   Deleta Colt PharmD Candidate [phone]  [facility]

## 2024-01-26 MED ORDER — PIRFENIDONE 267 MG PO TABS
ORAL_TABLET | ORAL | 0 refills | Status: DC
  Filled 2024-01-27: qty 207, 30d supply, fill #0

## 2024-01-26 MED ORDER — PIRFENIDONE 267 MG PO TABS
267.0000 mg | ORAL_TABLET | Freq: Three times a day (TID) | ORAL | 4 refills | Status: DC
  Filled 2024-01-27 – 2024-02-19 (×2): qty 270, 90d supply, fill #0
  Filled 2024-03-30: qty 270, 90d supply, fill #1

## 2024-01-26 NOTE — Telephone Encounter (Signed)
 Patient counseled on purpose, proper use, and potential adverse effects including nausea, vomiting, abdominal pain, GERD, weight loss, arthralgia, dizziness, and suns sensitivity/rash.  Stressed the importance of routine lab monitoring. Will monitor LFT's every month for the first 6 months of treatment then every 3 months. Will monitor CBC every 3 months.  Starting dose will be Esbriet  267 mg 1 tablet three times daily for 7 days, then 2 tablets three times daily for 7 days, then 3 tablets three times daily.  Maintenance dose will be 801 mg 1 tablet three times daily if tolerated.  Stressed the importance of taking with meals to minimize stomach upset  Rx sent to [facility] for onboarding. Patient aware that [facility] will reach out for onboarding  Sherry Pennant, PharmD, MPH, BCPS, CPP Clinical Pharmacist (Rheumatology and Pulmonology)

## 2024-01-27 ENCOUNTER — Other Ambulatory Visit: Payer: Self-pay

## 2024-01-27 ENCOUNTER — Other Ambulatory Visit (HOSPITAL_COMMUNITY): Payer: Self-pay

## 2024-01-27 NOTE — Progress Notes (Signed)
 Specialty Pharmacy Initial Fill Coordination Note  Joy Ball is a 58 y.o. female contacted today regarding initial fill of specialty medication(s) Pirfenidone    Patient requested Delivery   Delivery date: 01/29/24   Verified address: [address]  [place] [address]   Medication will be filled on 01/28/2024.   Patient is aware of $4 copayment and wishes for copay to be billed to AR account. Advised that she can put a credit card on file at a later time if she changes her mind.

## 2024-01-28 ENCOUNTER — Other Ambulatory Visit: Payer: Self-pay

## 2024-01-28 ENCOUNTER — Other Ambulatory Visit: Payer: Self-pay | Admitting: Family Medicine

## 2024-01-28 DIAGNOSIS — J4521 Mild intermittent asthma with (acute) exacerbation: Secondary | ICD-10-CM

## 2024-01-29 NOTE — Progress Notes (Signed)
 Patient counseled  on 01/26/24 regarding pirfenidone : Patient counseled on purpose, proper use, and potential adverse effects including nausea, vomiting, abdominal pain, GERD, weight loss, arthralgia, dizziness, and suns sensitivity/rash.  Stressed the importance of routine lab monitoring. Will monitor LFT's every month for the first 6 months of treatment then every 3 months. Will monitor CBC every 3 months.   Starting dose will be Esbriet  267 mg 1 tablet three times daily for 7 days, then 2 tablets three times daily for 7 days, then 3 tablets three times daily.  Maintenance dose will be 801 mg 1 tablet three times daily if tolerated.  Stressed the importance of taking with meals to minimize stomach upset   Rx sent to [facility] for onboarding. Patient aware that [facility] will reach out for onboarding   Sherry Pennant, PharmD, MPH, BCPS, CPP Clinical Pharmacist (Rheumatology and Pulmonology)

## 2024-01-30 NOTE — Telephone Encounter (Signed)
 Requested Prescriptions  Pending Prescriptions Disp Refills   albuterol  (VENTOLIN  HFA) 108 (90 Base) MCG/ACT inhaler [Pharmacy Med Name: VENTOLIN  HFA INH W/DOS CTR 200PUFFS] 18 g 1    Sig: INHALE 2 PUFFS INTO THE LUNGS EVERY 6 HOURS AS NEEDED FOR WHEEZING OR SHORTNESS OF BREATH     Pulmonology:  Beta Agonists 2 Passed - 01/30/2024  8:42 PM      Passed - Last BP in normal range    BP Readings from Last 1 Encounters:  01/13/24 (!) 90/58         Passed - Last Heart Rate in normal range    Pulse Readings from Last 1 Encounters:  01/13/24 94         Passed - Valid encounter within last 12 months    Recent Outpatient Visits           3 months ago Adenocarcinoma of right lung [facility])   [facility] [place], Marsa PARAS, DO       Future Appointments             In 1 month Agbor-Etang, Redell, MD [facility]

## 2024-02-02 DIAGNOSIS — Z419 Encounter for procedure for purposes other than remedying health state, unspecified: Secondary | ICD-10-CM | POA: Diagnosis not present

## 2024-02-03 ENCOUNTER — Encounter

## 2024-02-08 ENCOUNTER — Ambulatory Visit

## 2024-02-12 ENCOUNTER — Ambulatory Visit
Admission: RE | Admit: 2024-02-12 | Discharge: 2024-02-12 | Disposition: A | Discharge status: Home / Self Care | Source: Ambulatory Visit | Attending: Radiation Oncology | Admitting: Radiation Oncology

## 2024-02-12 DIAGNOSIS — C3491 Malignant neoplasm of unspecified part of right bronchus or lung: Secondary | ICD-10-CM | POA: Diagnosis not present

## 2024-02-12 DIAGNOSIS — C3490 Malignant neoplasm of unspecified part of unspecified bronchus or lung: Secondary | ICD-10-CM | POA: Diagnosis not present

## 2024-02-12 DIAGNOSIS — I700 Atherosclerosis of aorta: Secondary | ICD-10-CM | POA: Diagnosis not present

## 2024-02-12 DIAGNOSIS — J479 Bronchiectasis, uncomplicated: Secondary | ICD-10-CM | POA: Diagnosis not present

## 2024-02-12 DIAGNOSIS — J439 Emphysema, unspecified: Secondary | ICD-10-CM | POA: Diagnosis not present

## 2024-02-12 MED ORDER — IOHEXOL 300 MG/ML  SOLN
65.0000 mL | Freq: Once | INTRAMUSCULAR | Status: AC | PRN
  Administered 2024-02-12: 65 mL via INTRAVENOUS

## 2024-02-16 ENCOUNTER — Ambulatory Visit: Admitting: Internal Medicine

## 2024-02-16 ENCOUNTER — Telehealth: Payer: Self-pay

## 2024-02-16 DIAGNOSIS — R112 Nausea with vomiting, unspecified: Secondary | ICD-10-CM

## 2024-02-16 NOTE — Telephone Encounter (Signed)
 Copied from CRM [id number]. Topic: Clinical - Medication Question >> Feb 16, 2024  2:18 PM Rilla B wrote: Patient on 3rd week of taking Pirfenidone . Would like to know if Dr Geronimo can call her in a script for nausea.  Please call patient and advise @ [phone].  Called and spoke with the patient. Pt states she has been feeling fatigue. Pt states even when eating with food she still feels nauseous, causing her not to eat the rest of the day from feeling like this.   Pt has been vomiting recently, with and without eating food. Dr. Geronimo can you please advise pt is requesting nausea medicine.

## 2024-02-17 ENCOUNTER — Telehealth: Payer: Self-pay

## 2024-02-17 ENCOUNTER — Other Ambulatory Visit (HOSPITAL_COMMUNITY): Payer: Self-pay

## 2024-02-17 NOTE — Telephone Encounter (Signed)
 Copied from CRM [id number]. Topic: Clinical - Medication Question >> Feb 16, 2024  2:18 PM Rilla B wrote: Patient on 3rd week of taking Pirfenidone . Would like to know if Dr Geronimo can call her in a script for nausea.  Please call patient and advise @ [phone].

## 2024-02-18 ENCOUNTER — Ambulatory Visit: Payer: Self-pay | Admitting: Internal Medicine

## 2024-02-18 ENCOUNTER — Ambulatory Visit: Admitting: Radiation Oncology

## 2024-02-18 DIAGNOSIS — R112 Nausea with vomiting, unspecified: Secondary | ICD-10-CM

## 2024-02-18 NOTE — Telephone Encounter (Signed)
 Duplicate encounter please see previous pended awaiting response from Dr Geronimo

## 2024-02-18 NOTE — Telephone Encounter (Signed)
 FYI Only or Action Required?: Action required by provider: Rx reqyest.  Patient is followed in Pulmonology for IPF, last seen on 01/13/2024 by Geronimo Amel, MD.  Called Nurse Triage reporting Nausea and Medication Problem.  Symptoms began about a month ago.  Interventions attempted: Increased fluids/rest.  Symptoms are: gradually worsening.  Triage Disposition: Call PCP Now  Patient/caregiver understands and will follow disposition?: Yes         Copied from CRM [id number]. Topic: Clinical - Prescription Issue >> Feb 18, 2024  4:28 PM Shona S wrote: Reason for CRM: patient is calling because she urgently need a prescription for nausea due to the medicine she was prescribed. She still have not received any information, please call patient Reason for Disposition  [1] Caller has URGENT medicine question about med that primary care doctor (or NP/PA) or specialist prescribed AND [2] triager unable to answer question  Answer Assessment - Initial Assessment Questions 1. NAME of MEDICINE: What medicine(s) are you calling about?     Pirfenidone  267 MG TABS 2. QUESTION: What is your question? (e.g., double dose of medicine, side effect)     Side effect 3. PRESCRIBER: Who prescribed the medicine? Reason: if prescribed by specialist, call should be referred to that group.     Geronimo Amel, MD 4. SYMPTOMS: Do you have any symptoms? If Yes, ask: What symptoms are you having?  How bad are the symptoms (e.g., mild, moderate, severe)     Nausea since first week of starting Rx 5. PREGNANCY:  Is there any chance that you are pregnant? When was your last menstrual period?     N/a    Pt reports that she wishes to stop Rx, if she cannot get nausea medication. This is her second attempt reaching out to [facility].  Answer Assessment - Initial Assessment Questions 1. NAUSEA SEVERITY: How bad is the nausea? (e.g., mild, moderate, severe; dehydration, weight loss)     Cannot keep  anything down, N/V, poor appetite 2. ONSET: When did the nausea begin?     X 1 month 3. VOMITING: Any vomiting? If Yes, ask: How many times today?     Endorses that she does not vomit every day, but nausea is every day-- nausea prevents her from wanting to eat 4. RECURRENT SYMPTOM: Have you had nausea before? If Yes, ask: When was the last time? What happened that time?     See above 5. CAUSE: What do you think is causing the nausea?     Pirfenidone  267 MG TABS 6. PREGNANCY: Is there any chance you are pregnant? (e.g., unprotected intercourse, missed birth control pill, broken condom)     N/a  Protocols used: Nausea-A-AH, Medication Question Call-A-AH

## 2024-02-19 ENCOUNTER — Other Ambulatory Visit: Payer: Self-pay

## 2024-02-19 MED ORDER — ONDANSETRON HCL 4 MG PO TABS: 4.0000 mg | ORAL_TABLET | Freq: Three times a day (TID) | ORAL | 1 refills | Status: DC | PRN

## 2024-02-19 MED ORDER — ONDANSETRON 4 MG PO TBDP: 4.0000 mg | ORAL_TABLET | Freq: Three times a day (TID) | ORAL | 2 refills | Status: DC | PRN

## 2024-02-19 NOTE — Telephone Encounter (Signed)
 Pt is aware nfn

## 2024-02-19 NOTE — Telephone Encounter (Signed)
 Spoke with patient, notified zofran  has been sent in

## 2024-02-19 NOTE — Telephone Encounter (Addendum)
 Please notify patient that I sent the nausea med rx Zofran  to her pharmacy.  -----  I reviewed the notes. I went ahead and sent Rx Zofran  ODT dissolving tab to [facility]. I checked for any interactions on the new medication and her other meds. I cannot find any interaction, so I sent the rx for nausea for her  Marsa Officer, DO [facility] 02/19/2024, 9:52 AM

## 2024-02-19 NOTE — Progress Notes (Signed)
 Specialty Pharmacy Refill Coordination Note  Joy Ball is a 58 y.o. female contacted today regarding refills of specialty medication(s) Pirfenidone    Patient requested Delivery   Delivery date: 02/24/24   Verified address: [address]  [place] [address]   Medication will be filled on 09.02.25.

## 2024-02-19 NOTE — Telephone Encounter (Signed)
 Take Zofran  4 mg 30 minutes before each dose of pirfenidone  as needed 3 times daily as needed.  This has been sent to [facility]

## 2024-02-21 ENCOUNTER — Encounter (INDEPENDENT_AMBULATORY_CARE_PROVIDER_SITE_OTHER): Payer: Self-pay

## 2024-02-23 ENCOUNTER — Other Ambulatory Visit: Payer: Self-pay

## 2024-02-23 NOTE — Addendum Note (Signed)
 Addended by: DAYNE SHERRY RAMAN on: 02/23/2024 12:54 PM   Modules accepted: Orders

## 2024-02-25 ENCOUNTER — Encounter: Payer: Self-pay | Admitting: Radiation Oncology

## 2024-02-25 ENCOUNTER — Ambulatory Visit
Admission: RE | Admit: 2024-02-25 | Discharge: 2024-02-25 | Disposition: A | Discharge status: Home / Self Care | Source: Ambulatory Visit | Attending: Radiation Oncology | Admitting: Radiation Oncology

## 2024-02-25 VITALS — BP 112/85 | HR 106 | Resp 18 | Ht 60.0 in | Wt 120.2 lb

## 2024-02-25 DIAGNOSIS — J984 Other disorders of lung: Secondary | ICD-10-CM | POA: Insufficient documentation

## 2024-02-25 DIAGNOSIS — Z923 Personal history of irradiation: Secondary | ICD-10-CM | POA: Diagnosis not present

## 2024-02-25 DIAGNOSIS — C3411 Malignant neoplasm of upper lobe, right bronchus or lung: Secondary | ICD-10-CM | POA: Insufficient documentation

## 2024-02-25 DIAGNOSIS — Z87891 Personal history of nicotine dependence: Secondary | ICD-10-CM | POA: Diagnosis not present

## 2024-02-25 DIAGNOSIS — C3491 Malignant neoplasm of unspecified part of right bronchus or lung: Secondary | ICD-10-CM

## 2024-02-25 NOTE — Progress Notes (Signed)
 Radiation Oncology Follow up Note  Name: Joy Ball   Date:   02/25/2024 MRN:  [id number] DOB: 11/17/1965    This 58 y.o. female presents to the clinic today for 49-month follow-up status post SBRT to right upper lobe for stage Ia (cT1b N0 M0) non-small cell lung cancer favoring adenocarcinoma.  REFERRING PROVIDER: Edman Blunt *  HPI: Patient is a 58 year old female now at 5 months having completed SBRT to her right upper lobe for presumed stage Ia non-small cell lung cancer favoring adenocarcinoma.  Seen today in routine follow-up she is doing fairly well.  She did have an episode of acute respiratory failure.  She was treated with steroids and antibiotic and improved.  She is currently using an nasal oxygen continuously.  She specifically denies cough hemoptysis or chest tightness.  Recent CT scan of her chest showed masslike consolidation in the left upper lobe with architectural distortion favoring postradiation changes no evidence of progressive disease.  She also had some extensive coarse interstitial thickening and ground glass in the right upper lobe also favoring posttreatment change.  She also had chronic lung disease primarily in the lung bases compatible with ILD.  COMPLICATIONS OF TREATMENT: none  FOLLOW UP COMPLIANCE: keeps appointments   PHYSICAL EXAM:  BP 112/85   Pulse (!) 106   Resp 18   Ht 5' (1.524 m)   Wt 120 lb 3.2 oz (54.5 kg)   BMI 23.47 kg/m  Patient is on nasal oxygen.  Well-developed well-nourished patient in NAD. HEENT reveals PERLA, EOMI, discs not visualized.  Oral cavity is clear. No oral mucosal lesions are identified. Neck is clear without evidence of cervical or supraclavicular adenopathy. Lungs are clear to A&P. Cardiac examination is essentially unremarkable with regular rate and rhythm without murmur rub or thrill. Abdomen is benign with no organomegaly or masses noted. Motor sensory and DTR levels are equal and symmetric in the upper and lower  extremities. Cranial nerves II through XII are grossly intact. Proprioception is intact. No peripheral adenopathy or edema is identified. No motor or sensory levels are noted. Crude visual fields are within normal range.  RADIOLOGY RESULTS: CT scan reviewed compatible with above-stated findings  PLAN: Present time patient is stable.  She has no evidence of progressive disc disease in her chest by CT criteria.  I am pleased with her overall progress.  She could continues close follow-up care with pulmonary medicine.  I have asked to see her back in 6 months for follow-up with a repeat CT scan of her chest.  Patient knows to call sooner with any concerns.  I would like to take this opportunity to thank you for allowing me to participate in the care of your patient.SABRA Marcey Penton, MD

## 2024-03-03 ENCOUNTER — Encounter: Payer: Self-pay | Admitting: Cardiology

## 2024-03-03 ENCOUNTER — Ambulatory Visit: Attending: Cardiology | Admitting: Cardiology

## 2024-03-03 VITALS — BP 110/68 | HR 96 | Ht 60.0 in | Wt 120.6 lb

## 2024-03-03 DIAGNOSIS — R0600 Dyspnea, unspecified: Secondary | ICD-10-CM | POA: Diagnosis not present

## 2024-03-03 DIAGNOSIS — R079 Chest pain, unspecified: Secondary | ICD-10-CM | POA: Diagnosis not present

## 2024-03-03 DIAGNOSIS — I2510 Atherosclerotic heart disease of native coronary artery without angina pectoris: Secondary | ICD-10-CM | POA: Diagnosis not present

## 2024-03-03 MED ORDER — ASPIRIN 81 MG PO TBEC: 81.0000 mg | DELAYED_RELEASE_TABLET | Freq: Every day | ORAL | Status: DC

## 2024-03-03 NOTE — Patient Instructions (Signed)
 Medication Instructions:  - START aspirin  81 mg daily  *If you need a refill on your cardiac medications before your next appointment, please call your pharmacy*  Lab Work: No labs ordered today  If you have labs (blood work) drawn today and your tests are completely normal, you will receive your results only by: MyChart Message (if you have MyChart) OR A paper copy in the mail If you have any lab test that is abnormal or we need to change your treatment, we will call you to review the results.  Testing/Procedures: Your provider has ordered a Lexiscan/ Exercise Myoview Stress test. This will take place at [facility] Please report to the [facility] medical mall entrance. The volunteers at the first desk will direct you where to go.  [facility] MYOVIEW  Your provider has ordered a Stress Test with nuclear imaging. The purpose of this test is to evaluate the blood supply to your heart muscle. This procedure is referred to as a Non-Invasive Stress Test. This is because other than having an IV started in your vein, nothing is inserted or invades your body. Cardiac stress tests are done to find areas of poor blood flow to the heart by determining the extent of coronary artery disease (CAD). Some patients exercise on a treadmill, which naturally increases the blood flow to your heart, while others who are unable to walk on a treadmill due to physical limitations will have a pharmacologic/chemical stress agent called Lexiscan . This medicine will mimic walking on a treadmill by temporarily increasing your coronary blood flow.   Please note: these test may take anywhere between 2-4 hours to complete  How to prepare for your Myoview test:  Nothing to eat for 6 hours prior to the test No caffeine for 24 hours prior to test No smoking 24 hours prior to test. Your medication may be taken with water.  If your doctor stopped a medication because of this test, do not take that medication. Ladies, please do not wear  dresses.  Skirts or pants are appropriate. Please wear a short sleeve shirt. No perfume, cologne or lotion. Wear comfortable walking shoes. No heels!   PLEASE NOTIFY THE OFFICE AT LEAST 24 HOURS IN ADVANCE IF YOU ARE UNABLE TO KEEP YOUR APPOINTMENT.  [phone] AND  PLEASE NOTIFY NUCLEAR MEDICINE AT [facility] AT LEAST 24 HOURS IN ADVANCE IF YOU ARE UNABLE TO KEEP YOUR APPOINTMENT. [phone]   Follow-Up: At [facility], you and your health needs are our priority.  As part of our continuing mission to provide you with exceptional heart care, our providers are all part of one team.  This team includes your primary Cardiologist (physician) and Advanced Practice Providers or APPs (Physician Assistants and Nurse Practitioners) who all work together to provide you with the care you need, when you need it.  Your next appointment:   3 month(s)  Provider:   You may see Dr. Darliss or one of the following Advanced Practice Providers on your designated Care Team:   Lonni Meager, NP Lesley Maffucci, PA-C Bernardino Bring, PA-C Cadence Kobuk, PA-C Tylene Lunch, NP Barnie Hila, NP    We recommend signing up for the patient portal called MyChart.  Sign up information is provided on this After Visit Summary.  MyChart is used to connect with patients for Virtual Visits (Telemedicine).  Patients are able to view lab/test results, encounter notes, upcoming appointments, etc.  Non-urgent messages can be sent to your provider as well.   To learn more about  what you can do with MyChart, go to [email or web address].

## 2024-03-03 NOTE — Progress Notes (Signed)
 Cardiology Office Note:    Date:  03/03/2024   ID:  Joy Ball, DOB [birth year], MRN [id number]  PCP:  Edman Marsa PARAS, DO   [facility] Providers Cardiologist:  None     Referring MD: Geronimo Amel, MD   Chief Complaint  Patient presents with   Establish Care    New pt has complaints of stabbing pain on left side of chest last night , no chest pain, chest pressure or SOB, medciation reviewed verbally with patient    History of Present Illness:    Joy Ball is a 58 y.o. female with a hx of CAD (LAD calcification on chest CT), former smoking x 40+ years, COPD, ILD on 2 L home oxygen, right upper lobe adenocarcinoma of the lung s/p radiation(not a candidate for surgery), presenting due to coronary calcifications.  Patient chest CT 8/25 showing LAD calcifications.  CT chest performed due to history of lung cancer.  She has occasional nonexertional pain on the left side, occurring sporadically over the past several months.  Has chronic shortness of breath which she attributes to COPD.  Echocardiogram was ordered, currently scheduled for next week.  Past Medical History:  Diagnosis Date   Allergy Idk   Arthritis Idk   Asthma    Cancer (HCC)    Cataract Idk   COPD (chronic obstructive pulmonary disease) (HCC)    Dyspnea    Emphysema lung (HCC)    Emphysema of lung (HCC) Idk   GERD (gastroesophageal reflux disease)    Lung cancer (HCC) 2024   Lung nodule    Migraines    Pulmonary fibrosis (HCC)    Seasonal allergies     Past Surgical History:  Procedure Laterality Date   CESAREAN SECTION     EYE SURGERY Bilateral 01/2023   TUBAL LIGATION      Current Medications: Current Meds  Medication Sig   acetaminophen  (TYLENOL ) 500 MG tablet Take 1,500 mg by mouth 3 (three) times daily. 2 to 3 times a day.   albuterol  (VENTOLIN  HFA) 108 (90 Base) MCG/ACT inhaler INHALE 2 PUFFS INTO THE LUNGS EVERY 6 HOURS AS NEEDED FOR WHEEZING OR SHORTNESS OF BREATH    aspirin  EC 81 MG tablet Take 1 tablet (81 mg total) by mouth daily. Swallow whole.   budesonide -formoterol  (SYMBICORT ) 160-4.5 MCG/ACT inhaler Inhale 2 puffs into the lungs daily.   buPROPion  (WELLBUTRIN  SR) 150 MG 12 hr tablet TAKE 1 TABLET(150 MG) BY MOUTH TWICE DAILY   fluticasone  (FLONASE ) 50 MCG/ACT nasal spray Place 2 sprays into both nostrils daily.   gabapentin  (NEURONTIN ) 300 MG capsule TAKE 1 CAPSULE(300 MG) BY MOUTH AT BEDTIME   ipratropium (ATROVENT ) 0.06 % nasal spray Place 2 sprays into both nostrils 4 (four) times daily.   ipratropium-albuterol  (DUONEB) 0.5-2.5 (3) MG/3ML SOLN Take 3 mLs by nebulization 2 (two) times daily.   loratadine  (CLARITIN ) 10 MG tablet Take 10 mg by mouth daily.   moxifloxacin (VIGAMOX) 0.5 % ophthalmic solution Place 1 drop into the right eye 4 (four) times daily.   naproxen  (NAPROSYN ) 500 MG tablet TAKE 1 TABLET(500 MG) BY MOUTH TWICE DAILY WITH A MEAL   nystatin  (MYCOSTATIN ) 100000 UNIT/ML suspension SHAKE LIQUID AND TAKE 5 ML BY MOUTH FOUR TIMES DAILY   omeprazole (PRILOSEC) 20 MG capsule Take 20 mg by mouth daily.   ondansetron  (ZOFRAN ) 4 MG tablet Take 1 tablet (4 mg total) by mouth every 8 (eight) hours as needed for nausea or vomiting. Take this 30 minutes  before each pirfenidone  dose   ondansetron  (ZOFRAN -ODT) 4 MG disintegrating tablet Take 1 tablet (4 mg total) by mouth every 8 (eight) hours as needed for nausea or vomiting.   Pirfenidone  267 MG TABS Take 1 tab three times daily for 7 days, then 2 tabs three times daily for 7 days, then 3 tabs three times daily thereafter.   Pirfenidone  267 MG TABS Take 1 tablet (267 mg total) by mouth with breakfast, with lunch, and with evening meal.   potassium chloride  SA (KLOR-CON  M) 20 MEQ tablet Take 1 tablet (20 mEq total) by mouth daily.   prednisoLONE acetate (PRED FORTE) 1 % ophthalmic suspension Place 1 drop into the right eye 4 (four) times daily.   predniSONE  (DELTASONE ) 10 MG tablet Please take  Take prednisone  40mg  once daily x 3 days, then 30mg  once daily x 3 days, then 20mg  once daily x 3 days, then prednisone  10mg  once daily  x 3 days and stop   predniSONE  (DELTASONE ) 10 MG tablet 4 x 3 days, 3 x 3 days, 2 x 3 days, 1 x 3 days   Tiotropium Bromide  Monohydrate (SPIRIVA  RESPIMAT) 2.5 MCG/ACT AERS Inhale 2 puffs into the lungs daily.     Allergies:   Amoxicillin   Social History   Socioeconomic History   Marital status: Widowed    Spouse name: Not on file   Number of children: 3   Years of education: Not on file   Highest education level: Some college, no degree  Occupational History   Occupation: Lexicographer houses    Employer: OTHER  Tobacco Use   Smoking status: Former    Current packs/day: 0.00    Average packs/day: 1.5 packs/day for 81.7 years (121.3 ttl pk-yrs)    Types: Cigarettes    Start date: 11    Quit date: 02/22/2023    Years since quitting: 1.0   Smokeless tobacco: Current   Tobacco comments:    Stopped in September of 2024 .hfb RN  Substance and Sexual Activity   Alcohol use: Yes    Alcohol/week: 0.0 standard drinks of alcohol    Comment: occ   Drug use: No   Sexual activity: Not on file  Other Topics Concern   Not on file  Social History Narrative   Not on file   Social Drivers of Health   Financial Resource Strain: Not on file  Food Insecurity: Food Insecurity Present (12/25/2023)   Hunger Vital Sign    Worried About Running Out of Food in the Last Year: Sometimes true    Ran Out of Food in the Last Year: Sometimes true  Transportation Needs: No Transportation Needs (12/25/2023)   PRAPARE - Administrator, Civil Service (Medical): No    Lack of Transportation (Non-Medical): No  Physical Activity: Not on file  Stress: Not on file  Social Connections: Unknown (12/22/2022)   Received from [facility]   Social Network    Social Network: Not on file     Family History: The patient's family history includes Arthritis in her mother;  Asthma in her mother; COPD in her mother; Cancer in her father; Emphysema in her mother; Heart disease in her maternal grandfather; Lung cancer in her father; Miscarriages / [place] in her sister and sister; Varicose Veins in her mother; Vision loss in her mother.  ROS:   Please see the history of present illness.     All other systems reviewed and are negative.  EKGs/Labs/Other Studies Reviewed:  The following studies were reviewed today:  EKG Interpretation Date/Time:  Thursday March 03 2024 09:38:09 EDT Ventricular Rate:  96 PR Interval:  134 QRS Duration:  72 QT Interval:  356 QTC Calculation: 449 R Axis:   29  Text Interpretation: Normal sinus rhythm Possible Left atrial enlargement Confirmed by Darliss Rogue ([id number]) on 03/03/2024 9:40:40 AM    Recent Labs: 12/25/2023: Magnesium 2.0 12/30/2023: ALT 18; Hemoglobin 13.3; Platelets 364 01/01/2024: BUN 23; Creatinine, Ser 0.69; Potassium 3.7; Sodium 132  Recent Lipid Panel    Component Value Date/Time   CHOL 274 (H) 11/20/2022 0933   TRIG 117 11/20/2022 0933   HDL 191 11/20/2022 0933   CHOLHDL 1.4 11/20/2022 0933   LDLCALC 63 11/20/2022 0933     Risk Assessment/Calculations:             Physical Exam:    VS:  BP 110/68 (BP Location: Left Arm, Patient Position: Sitting, Cuff Size: Normal)   Pulse 96   Ht 5' (1.524 m)   Wt 120 lb 9.6 oz (54.7 kg)   SpO2 98%   BMI 23.55 kg/m     Wt Readings from Last 3 Encounters:  03/03/24 120 lb 9.6 oz (54.7 kg)  02/25/24 120 lb 3.2 oz (54.5 kg)  01/13/24 125 lb (56.7 kg)     GEN:  Well nourished, well developed in no acute distress HEENT: Normal NECK: No JVD; No carotid bruits CARDIAC: RRR, no murmurs, rubs, gallops RESPIRATORY: Bilateral rhonchi, crackles noted ABDOMEN: Soft, non-tender, non-distended MUSCULOSKELETAL:  No edema; No deformity  SKIN: Warm and dry NEUROLOGIC:  Alert and oriented x 3 PSYCHIATRIC:  Normal affect   ASSESSMENT:    1. Chest  pain of uncertain etiology   2. Coronary artery disease involving native heart without angina pectoris, unspecified vessel or lesion type   3. Dyspnea, unspecified type    PLAN:    In order of problems listed above:  Chest pain, history of smoking.  Obtain Lexiscan Myoview, obtain echocardiogram are scheduled for next week.  Start aspirin  81 mg daily, LDL at goal. LAD calcifications, chest pain.  Echo and Myoview as above.  Start aspirin  81 mg daily. Shortness of breath, on home oxygen, COPD, ILD likely etiology.  Cardiac workup as above.  Follow-up after cardiac testing     Medication Adjustments/Labs and Tests Ordered: Current medicines are reviewed at length with the patient today.  Concerns regarding medicines are outlined above.  Orders Placed This Encounter  Procedures   NM Myocar Multi W/Spect W/Wall Motion / EF   EKG 12-Lead   Meds ordered this encounter  Medications   aspirin  EC 81 MG tablet    Sig: Take 1 tablet (81 mg total) by mouth daily. Swallow whole.    Patient Instructions  Medication Instructions:  - START aspirin  81 mg daily  *If you need a refill on your cardiac medications before your next appointment, please call your pharmacy*  Lab Work: No labs ordered today  If you have labs (blood work) drawn today and your tests are completely normal, you will receive your results only by: MyChart Message (if you have MyChart) OR A paper copy in the mail If you have any lab test that is abnormal or we need to change your treatment, we will call you to review the results.  Testing/Procedures: Your provider has ordered a Lexiscan/ Exercise Myoview Stress test. This will take place at [facility]. Please report to the [facility] medical mall entrance. The volunteers at the first  desk will direct you where to go.  [facility] MYOVIEW  Your provider has ordered a Stress Test with nuclear imaging. The purpose of this test is to evaluate the blood supply to your heart muscle. This  procedure is referred to as a Non-Invasive Stress Test. This is because other than having an IV started in your vein, nothing is inserted or invades your body. Cardiac stress tests are done to find areas of poor blood flow to the heart by determining the extent of coronary artery disease (CAD). Some patients exercise on a treadmill, which naturally increases the blood flow to your heart, while others who are unable to walk on a treadmill due to physical limitations will have a pharmacologic/chemical stress agent called Lexiscan . This medicine will mimic walking on a treadmill by temporarily increasing your coronary blood flow.   Please note: these test may take anywhere between 2-4 hours to complete  How to prepare for your Myoview test:  Nothing to eat for 6 hours prior to the test No caffeine for 24 hours prior to test No smoking 24 hours prior to test. Your medication may be taken with water.  If your doctor stopped a medication because of this test, do not take that medication. Ladies, please do not wear dresses.  Skirts or pants are appropriate. Please wear a short sleeve shirt. No perfume, cologne or lotion. Wear comfortable walking shoes. No heels!   PLEASE NOTIFY THE OFFICE AT LEAST 24 HOURS IN ADVANCE IF YOU ARE UNABLE TO KEEP YOUR APPOINTMENT.  [phone] AND  PLEASE NOTIFY NUCLEAR MEDICINE AT [facility] AT LEAST 24 HOURS IN ADVANCE IF YOU ARE UNABLE TO KEEP YOUR APPOINTMENT. [phone]   Follow-Up: At [facility], you and your health needs are our priority.  As part of our continuing mission to provide you with exceptional heart care, our providers are all part of one team.  This team includes your primary Cardiologist (physician) and Advanced Practice Providers or APPs (Physician Assistants and Nurse Practitioners) who all work together to provide you with the care you need, when you need it.  Your next appointment:   3 month(s)  Provider:   You may see Dr.  Darliss or one of the following Advanced Practice Providers on your designated Care Team:   Lonni Meager, NP Lesley Maffucci, PA-C Bernardino Bring, PA-C Cadence [address], PA-C Tylene Lunch, NP Barnie Hila, NP    We recommend signing up for the patient portal called MyChart.  Sign up information is provided on this After Visit Summary.  MyChart is used to connect with patients for Virtual Visits (Telemedicine).  Patients are able to view lab/test results, encounter notes, upcoming appointments, etc.  Non-urgent messages can be sent to your provider as well.   To learn more about what you can do with MyChart, go to [email or web address].          Signed, Redell Darliss, MD  03/03/2024 11:10 AM    [facility]

## 2024-03-04 DIAGNOSIS — Z419 Encounter for procedure for purposes other than remedying health state, unspecified: Secondary | ICD-10-CM | POA: Diagnosis not present

## 2024-03-06 DIAGNOSIS — R0602 Shortness of breath: Secondary | ICD-10-CM | POA: Diagnosis not present

## 2024-03-07 ENCOUNTER — Encounter: Payer: Self-pay | Admitting: Nurse Practitioner

## 2024-03-07 ENCOUNTER — Ambulatory Visit: Admitting: Nurse Practitioner

## 2024-03-07 VITALS — BP 92/60 | HR 94 | Ht 61.0 in | Wt 117.0 lb

## 2024-03-07 DIAGNOSIS — K219 Gastro-esophageal reflux disease without esophagitis: Secondary | ICD-10-CM | POA: Diagnosis not present

## 2024-03-07 DIAGNOSIS — J8410 Pulmonary fibrosis, unspecified: Secondary | ICD-10-CM | POA: Diagnosis not present

## 2024-03-07 DIAGNOSIS — Z87891 Personal history of nicotine dependence: Secondary | ICD-10-CM

## 2024-03-07 DIAGNOSIS — J441 Chronic obstructive pulmonary disease with (acute) exacerbation: Secondary | ICD-10-CM

## 2024-03-07 DIAGNOSIS — C3491 Malignant neoplasm of unspecified part of right bronchus or lung: Secondary | ICD-10-CM

## 2024-03-07 DIAGNOSIS — J9611 Chronic respiratory failure with hypoxia: Secondary | ICD-10-CM

## 2024-03-07 DIAGNOSIS — Z5181 Encounter for therapeutic drug level monitoring: Secondary | ICD-10-CM

## 2024-03-07 DIAGNOSIS — J84112 Idiopathic pulmonary fibrosis: Secondary | ICD-10-CM

## 2024-03-07 DIAGNOSIS — J4489 Other specified chronic obstructive pulmonary disease: Secondary | ICD-10-CM | POA: Diagnosis not present

## 2024-03-07 LAB — HEPATIC FUNCTION PANEL
ALT: 9 U/L (ref 0–35)
AST: 21 U/L (ref 0–37)
Albumin: 3.8 g/dL (ref 3.5–5.2)
Alkaline Phosphatase: 127 U/L — ABNORMAL HIGH (ref 39–117)
Bilirubin, Direct: 0.1 mg/dL (ref 0.0–0.3)
Total Bilirubin: 0.2 mg/dL (ref 0.2–1.2)
Total Protein: 6.3 g/dL (ref 6.0–8.3)

## 2024-03-07 LAB — BASIC METABOLIC PANEL WITH GFR
BUN: 11 mg/dL (ref 6–23)
CO2: 24 meq/L (ref 19–32)
Calcium: 9.1 mg/dL (ref 8.4–10.5)
Chloride: 100 meq/L (ref 96–112)
Creatinine, Ser: 0.64 mg/dL (ref 0.40–1.20)
GFR: 98.31 mL/min (ref >60.00)
Glucose, Bld: 73 mg/dL (ref 70–99)
Potassium: 3.9 meq/L (ref 3.5–5.1)
Sodium: 135 meq/L (ref 135–145)

## 2024-03-07 MED ORDER — OMEPRAZOLE 40 MG PO CPDR: 40.0000 mg | DELAYED_RELEASE_CAPSULE | Freq: Two times a day (BID) | ORAL | 11 refills | Status: DC

## 2024-03-07 NOTE — Assessment & Plan Note (Signed)
 Stable on recent imaging. Follow up with radiation oncology as scheduled.

## 2024-03-07 NOTE — Patient Instructions (Signed)
 Continue Albuterol  inhaler 2 puffs or 3 mL neb every 6 hours as needed for shortness of breath or wheezing. Notify if symptoms persist despite rescue inhaler/neb use.  Continue Symbicort  2 puffs Twice daily. Brush tongue and rinse mouth afterwards Continue Spiriva  2 puffs daily Continue flonase  nasal spray  Continue claritin  daily Continue ondansetron  4 mg every 8 hours as needed for nausea/vomiting Continue pirfenidone  3 tablets, 3 times a day with meals. Wear sunscreen when outside. Make sure you are trying to space these dosing 6-8 hours apart, as possible  Continue supplemental oxygen  2 lpm at rest and 4-6 lpm with activity for goal >88-90%  Increase omeprazole  to 40 mg Twice daily. Take 30 minutes prior to meals. Avoid laying down within 2-3 hours of eating. Avoid greasy, fried foods, acidic foods, caffeine, or known reflux causing foods  Contact pulmonary rehab to schedule -   Labs today   Follow up in 6 weeks with Dr. Geronimo or Izetta Madgie Dhaliwal,NP after 30 minute PFT. If symptoms do not improve or worsen, please contact office for sooner follow up or seek emergency care.

## 2024-03-07 NOTE — Assessment & Plan Note (Signed)
 IPF on pirfenidone . Difficulties with N/V. Symptom management with ondansetron . Suspect worsening GERD symptoms also playing a factor. Will increase her PPI and monitor response. Advised to ensure she is taking with meals and spacing doses out appropriately. If she continues to have difficulties or continues to lose weight, we will need to discuss holding therapy or decreasing dosing. Check labs today for monitoring. Provided with contact info for pulmonary rehab to schedule orientation. Encouraged to work on graded exercises. Repeat PFT at follow up.  Patient Instructions  Continue Albuterol  inhaler 2 puffs or 3 mL neb every 6 hours as needed for shortness of breath or wheezing. Notify if symptoms persist despite rescue inhaler/neb use.  Continue Symbicort  2 puffs Twice daily. Brush tongue and rinse mouth afterwards Continue Spiriva  2 puffs daily Continue flonase  nasal spray  Continue claritin  daily Continue ondansetron  4 mg every 8 hours as needed for nausea/vomiting Continue pirfenidone  3 tablets, 3 times a day with meals. Wear sunscreen when outside. Make sure you are trying to space these dosing 6-8 hours apart, as possible  Continue supplemental oxygen  2 lpm at rest and 4-6 lpm with activity for goal >88-90%  Increase omeprazole  to 40 mg Twice daily. Take 30 minutes prior to meals. Avoid laying down within 2-3 hours of eating. Avoid greasy, fried foods, acidic foods, caffeine, or known reflux causing foods  Contact pulmonary rehab to schedule -   Labs today   Follow up in 6 weeks with Dr. Geronimo or Izetta Rokhaya Quinn,NP after 30 minute PFT. If symptoms do not improve or worsen, please contact office for sooner follow up or seek emergency care.

## 2024-03-07 NOTE — Assessment & Plan Note (Signed)
 Stable without increased O2 requirement. Walk test with hypoxia on 2 lpm; required 4 lpm to maintain saturations. Advised to continue to monitor and titrate as needed. Goal >88-90%

## 2024-03-07 NOTE — Assessment & Plan Note (Signed)
 Stable without recent exacerbation. Appears compensated on current regimen. Action plan in place.

## 2024-03-07 NOTE — Assessment & Plan Note (Signed)
 See above. GERD precautions reviewed.

## 2024-03-07 NOTE — Progress Notes (Signed)
 @Patient  ID: Joy Ball, female    DOB: Nov 23, 1965, 58 y.o.   MRN: [id number]  Chief Complaint  Patient presents with   Medical Management of Chronic Issues    Pt states reflux getting worse     Referring provider: Edman Blunt *  HPI: 58 year old female, former smoker followed for IPF, COPD and chronic respiratory failure. She is a patient of Dr. Reeves and last seen in office 01/13/2024. She has a history of adenocarcinoma s/p SBRT to RUL stage Ia NSCLC. Past medical history significant for GERD, severe protein calorie malnutrition.   TEST/EVENTS:  11/23/2023 PFT: FVC 70, FEV1 66, ratio 73, TLC 77, DLCO 39 02/12/2024 CT chest w contrast: atherosclerosis. CAD. Prominent mediastinal nodes, stable from prior. Masslike consolidation in LUL; favor to reflect postradiation change. New coarse interstitial thickening and ground-glass in RUL with btx; posttreatment change. Similar chronic lung disease.   01/13/2024: OV with Dr. Geronimo. Former heavy smoker; quit in September 2024. Has pulmonary fibrosis/ILD since 2013. Dr. Creston took care of her and approach was supportive care. Both sisters and maternal grandmother had IPF. ILD was likely stable but started progressing summer 2024 based on imaging then significant progression by early 2025. Hospitalized July 2025 for acute on chronic respiratory failure CTA shows groundglass opacities. Possible ILD flareup, probably related to radiation or virus. Now on 4 lpm O2. Sats 82% at rest on room air and corrects to 91% with 2 lpm at rest.  Start pirfenidone . Refer to pulmonary rehab at [facility]. Take ILD questionnaire and bring back.  Continue Spiriva  and Symbicort . Continue 2 lpm at rest and 4-6 lpm with exertion. Await echo.   03/07/2024: Today - follow up Discussed the use of AI scribe software for clinical note transcription with the patient, who gave verbal consent to proceed.  History of Present Illness Joy Ball is a 58 year  old female with pulmonary fibrosis who presents for follow up. She is accompanied by her boyfriend.  She experiences nausea and takes Zofran  30 minutes before her pirfenidone  doses, primarily in the morning and at night. Occasional vomiting had occurred prior to starting, but Zofran  makes the nausea more tolerable.  She experiences acid reflux and takes over-the-counter omeprazole , doubling the dose to 40 mg to manage symptoms. This helps, but the cost is becoming prohibitive.   She is on pirfenidone , taking three pills three times a day, spaced five to six hours apart. A dry cough is present and stable. She uses oxygen  therapy, requiring two liters at rest and 4-6 liters when active. Despite this, her oxygen  levels drop to 60-70s at home. Pulmonary rehab has not started due to her boyfriend's surgery. She needs to call them back to reschedule. Weight loss is noted, from 125 pounds in July to 117 pounds currently. Her breathing feels stable. No night sweats, fevers, wheezing, chest congestion.   She experiences occasional ankle swelling, when she's on her feet for longer periods. She is scheduled for a nuclear medicine test to assess heart function. She uses Symbicort , Spiriva , and albuterol  as needed, with albuterol  used once every other day, which she finds helpful.    Allergies  Allergen Reactions   Amoxicillin Hives    Immunization History  Administered Date(s) Administered   Influenza,inj,Quad PF,6+ Mos 07/27/2015   Influenza-Unspecified 02/24/2014    Past Medical History:  Diagnosis Date   Allergy Idk   Arthritis Idk   Asthma    Cancer (HCC)    Cataract Idk  COPD (chronic obstructive pulmonary disease) (HCC)    Dyspnea    Emphysema lung (HCC)    Emphysema of lung (HCC) Idk   GERD (gastroesophageal reflux disease)    Lung cancer (HCC) 2024   Lung nodule    Migraines    Pulmonary fibrosis (HCC)    Seasonal allergies     Tobacco History: Social History   Tobacco Use   Smoking Status Former   Current packs/day: 0.00   Average packs/day: 1.5 packs/day for 81.7 years (121.3 ttl pk-yrs)   Types: Cigarettes   Start date: 44   Quit date: 02/22/2023   Years since quitting: 1.0  Smokeless Tobacco Current  Tobacco Comments   Stopped in September of 2024 .hfb RN   Ready to quit: Not Answered Counseling given: Not Answered Tobacco comments: Stopped in September of 2024 .hfb RN   Outpatient Medications Prior to Visit  Medication Sig Dispense Refill   acetaminophen  (TYLENOL ) 500 MG tablet Take 1,500 mg by mouth 3 (three) times daily. 2 to 3 times a day.     albuterol  (VENTOLIN  HFA) 108 (90 Base) MCG/ACT inhaler INHALE 2 PUFFS INTO THE LUNGS EVERY 6 HOURS AS NEEDED FOR WHEEZING OR SHORTNESS OF BREATH 18 g 1   aspirin  EC 81 MG tablet Take 1 tablet (81 mg total) by mouth daily. Swallow whole.     budesonide -formoterol  (SYMBICORT ) 160-4.5 MCG/ACT inhaler Inhale 2 puffs into the lungs daily. 1 each 12   buPROPion  (WELLBUTRIN  SR) 150 MG 12 hr tablet TAKE 1 TABLET(150 MG) BY MOUTH TWICE DAILY 180 tablet 0   fluticasone  (FLONASE ) 50 MCG/ACT nasal spray Place 2 sprays into both nostrils daily. 16 g 0   gabapentin  (NEURONTIN ) 300 MG capsule TAKE 1 CAPSULE(300 MG) BY MOUTH AT BEDTIME 90 capsule 0   ipratropium (ATROVENT ) 0.06 % nasal spray Place 2 sprays into both nostrils 4 (four) times daily.     ipratropium-albuterol  (DUONEB) 0.5-2.5 (3) MG/3ML SOLN Take 3 mLs by nebulization 2 (two) times daily. 360 mL 0   loratadine  (CLARITIN ) 10 MG tablet Take 10 mg by mouth daily.     moxifloxacin (VIGAMOX) 0.5 % ophthalmic solution Place 1 drop into the right eye 4 (four) times daily.     naproxen  (NAPROSYN ) 500 MG tablet TAKE 1 TABLET(500 MG) BY MOUTH TWICE DAILY WITH A MEAL 180 tablet 0   nystatin  (MYCOSTATIN ) 100000 UNIT/ML suspension SHAKE LIQUID AND TAKE 5 ML BY MOUTH FOUR TIMES DAILY 60 mL 2   ondansetron  (ZOFRAN ) 4 MG tablet Take 1 tablet (4 mg total) by mouth every 8  (eight) hours as needed for nausea or vomiting. Take this 30 minutes before each pirfenidone  dose 90 tablet 1   ondansetron  (ZOFRAN -ODT) 4 MG disintegrating tablet Take 1 tablet (4 mg total) by mouth every 8 (eight) hours as needed for nausea or vomiting. 30 tablet 2   Pirfenidone  267 MG TABS Take 1 tab three times daily for 7 days, then 2 tabs three times daily for 7 days, then 3 tabs three times daily thereafter. 207 tablet 0   Pirfenidone  267 MG TABS Take 1 tablet (267 mg total) by mouth with breakfast, with lunch, and with evening meal. 270 tablet 4   potassium chloride  SA (KLOR-CON  M) 20 MEQ tablet Take 1 tablet (20 mEq total) by mouth daily. 7 tablet 0   prednisoLONE acetate (PRED FORTE) 1 % ophthalmic suspension Place 1 drop into the right eye 4 (four) times daily.     predniSONE  (DELTASONE ) 10 MG  tablet Please take Take prednisone  40mg  once daily x 3 days, then 30mg  once daily x 3 days, then 20mg  once daily x 3 days, then prednisone  10mg  once daily  x 3 days and stop 30 tablet 0   predniSONE  (DELTASONE ) 10 MG tablet 4 x 3 days, 3 x 3 days, 2 x 3 days, 1 x 3 days 30 tablet 0   Tiotropium Bromide  Monohydrate (SPIRIVA  RESPIMAT) 2.5 MCG/ACT AERS Inhale 2 puffs into the lungs daily. 4 g 12   omeprazole  (PRILOSEC) 20 MG capsule Take 20 mg by mouth daily.  6   No facility-administered medications prior to visit.     Review of Systems: as above    Physical Exam:  BP 92/60   Pulse 94   Ht 5' 1 (1.549 m)   Wt 117 lb (53.1 kg)   SpO2 100%   PF (!) 2 L/min   BMI 22.11 kg/m   GEN: Pleasant, interactive, chronically-ill appearing; in no acute distress HEENT:  Normocephalic and atraumatic. PERRLA. Sclera white. Nasal turbinates pink, moist and patent bilaterally. No rhinorrhea present. Oropharynx pink and moist, without exudate or edema. No lesions, ulcerations, or postnasal drip.  NECK:  Supple w/ fair ROM. No JVD present.  No lymphadenopathy.   CV: RRR, no m/r/g, no peripheral edema.  Pulses intact, +2 bilaterally. No cyanosis, pallor or clubbing. PULMONARY:  Unlabored, regular breathing. Bibasilar crackles bilaterally A&P w/o wheezes/rales/rhonchi. No accessory muscle use.  GI: BS present and normoactive. Soft, non-tender to palpation. No organomegaly or masses detected.  MSK: No erythema, warmth or tenderness. Cap refil <2 sec all extrem.  Neuro: A/Ox3. No focal deficits noted.   Skin: Warm, no lesions or rashe Psych: Normal affect and behavior. Judgement and thought content appropriate.     Lab Results:  CBC    Component Value Date/Time   WBC 8.0 12/30/2023 0417   RBC 3.66 (L) 12/30/2023 0417   HGB 13.3 12/30/2023 0417   HGB 15.2 (H) 08/19/2023 1611   HCT 39.7 12/30/2023 0417   PLT 364 12/30/2023 0417   PLT 320 08/19/2023 1611   MCV 108.5 (H) 12/30/2023 0417   MCH 36.3 (H) 12/30/2023 0417   MCHC 33.5 12/30/2023 0417   RDW 13.0 12/30/2023 0417   LYMPHSABS 2.0 12/24/2023 2147   MONOABS 0.9 12/24/2023 2147   EOSABS 0.1 12/24/2023 2147   BASOSABS 0.1 12/24/2023 2147    BMET    Component Value Date/Time   NA 132 (L) 01/01/2024 0603   K 3.7 01/01/2024 0603   CL 96 (L) 01/01/2024 0603   CO2 23 01/01/2024 0603   GLUCOSE 113 (H) 01/01/2024 0603   BUN 23 (H) 01/01/2024 0603   CREATININE 0.69 01/01/2024 0603   CREATININE 0.76 09/02/2023 1533   CREATININE 0.75 11/28/2022 0915   CALCIUM 9.2 01/01/2024 0603   GFRNONAA >60 01/01/2024 0603   GFRNONAA >60 09/02/2023 1533   GFRNONAA >89 10/29/2016 1225   GFRAA >89 10/29/2016 1225    BNP No results found for: BNP   Imaging:  CT Chest W Contrast Result Date: 02/14/2024 CLINICAL DATA:  Non-small cell lung cancer common nonmetastatic, follow-up. * Tracking Code: BO * EXAM: CT CHEST WITH CONTRAST TECHNIQUE: Multidetector CT imaging of the chest was performed during intravenous contrast administration. RADIATION DOSE REDUCTION: This exam was performed according to the departmental dose-optimization program  which includes automated exposure control, adjustment of the mA and/or kV according to patient size and/or use of iterative reconstruction technique. CONTRAST:  65mL OMNIPAQUE  IOHEXOL   300 MG/ML  SOLN COMPARISON:  Multiple priors including chest CT and PET-CT July 31, 2023 FINDINGS: Cardiovascular: Aortic atherosclerosis. Coronary artery calcifications. Normal size heart. No significant pericardial effusion/thickening. Mediastinum/Nodes: No suspicious thyroid  nodule. Prominent mediastinal lymph nodes are stable from prior for instance a low right paratracheal lymph node measuring 8 mm in short axis on image 56/2 previously 7 mm and a pre-vascular lymph node measuring 6 mm in short axis on image 54/2, unchanged. Lungs/Pleura: Masslike consolidation in the left upper lobe with associated architectural distortion measures 5.5 x 2.9 cm on image 55/3 this obscures the previously indexed pulmonary nodule. No other focus irregular consolidation anterior to this measures 18 x 15 mm on image 55/3. There is new extensive coarse interstitial thickening and ground-glass in the right upper lobe with bronchiectasis/bronchiolectasis. Similar chronic lung disease predominantly in the lung bases compatible with ILD. Right-greater-than-left biapical pleuroparenchymal scarring. Emphysema. Upper Abdomen: No acute finding. Musculoskeletal: No aggressive lytic or blastic lesion of bone. IMPRESSION: 1. Masslike consolidation in the left upper lobe with associated architectural distortion measures 5.5 x 2.9 cm this obscures the previously indexed pulmonary nodule. Findings are favored to reflect postradiation change. Suggest continued attention on follow-up imaging. 2. New extensive coarse interstitial thickening and ground-glass in the right upper lobe with bronchiectasis/bronchiolectasis. Findings are also favored posttreatment change. 3. Similar chronic lung disease predominantly in the lung bases compatible with ILD. 4. Aortic  Atherosclerosis (ICD10-I70.0) and Emphysema (ICD10-J43.9). Electronically Signed   By: Reyes Holder M.D.   On: 02/14/2024 07:20    Administration History     None          Latest Ref Rng & Units 11/23/2023   10:45 AM 08/31/2023    9:59 AM 01/01/2023    2:22 PM  PFT Results  FVC-Pre L 2.17  2.25  2.15   FVC-Predicted Pre % 70  73  69   FVC-Post L  2.26  2.37   FVC-Predicted Post %  73  77   Pre FEV1/FVC % % 73  68  66   Post FEV1/FCV % %  73  71   FEV1-Pre L 1.59  1.53  1.42   FEV1-Predicted Pre % 66  64  58   FEV1-Post L  1.65  1.67   DLCO uncorrected ml/min/mmHg 7.27  6.65  5.35   DLCO UNC% % 39  35  28   DLVA Predicted % 55  47  33   TLC L 3.55  3.47  3.17   TLC % Predicted % 77  75  68   RV % Predicted % 80  69  69     No results found for: NITRICOXIDE      Assessment & Plan:   Pulmonary fibrosis (HCC) IPF on pirfenidone . Difficulties with N/V. Symptom management with ondansetron . Suspect worsening GERD symptoms also playing a factor. Will increase her PPI and monitor response. Advised to ensure she is taking with meals and spacing doses out appropriately. If she continues to have difficulties or continues to lose weight, we will need to discuss holding therapy or decreasing dosing. Check labs today for monitoring. Provided with contact info for pulmonary rehab to schedule orientation. Encouraged to work on graded exercises. Repeat PFT at follow up.  Patient Instructions  Continue Albuterol  inhaler 2 puffs or 3 mL neb every 6 hours as needed for shortness of breath or wheezing. Notify if symptoms persist despite rescue inhaler/neb use.  Continue Symbicort  2 puffs Twice daily. Brush tongue and rinse  mouth afterwards Continue Spiriva  2 puffs daily Continue flonase  nasal spray  Continue claritin  daily Continue ondansetron  4 mg every 8 hours as needed for nausea/vomiting Continue pirfenidone  3 tablets, 3 times a day with meals. Wear sunscreen when outside. Make sure  you are trying to space these dosing 6-8 hours apart, as possible  Continue supplemental oxygen  2 lpm at rest and 4-6 lpm with activity for goal >88-90%  Increase omeprazole  to 40 mg Twice daily. Take 30 minutes prior to meals. Avoid laying down within 2-3 hours of eating. Avoid greasy, fried foods, acidic foods, caffeine, or known reflux causing foods  Contact pulmonary rehab to schedule -   Labs today   Follow up in 6 weeks with Dr. Geronimo or Izetta Leza Apsey,NP after 30 minute PFT. If symptoms do not improve or worsen, please contact office for sooner follow up or seek emergency care.    Adenocarcinoma of right lung (HCC) Stable on recent imaging. Follow up with radiation oncology as scheduled.   Chronic respiratory failure with hypoxia (HCC) Stable without increased O2 requirement. Walk test with hypoxia on 2 lpm; required 4 lpm to maintain saturations. Advised to continue to monitor and titrate as needed. Goal >88-90%  Asthma with COPD (HCC) Stable without recent exacerbation. Appears compensated on current regimen. Action plan in place.   GERD (gastroesophageal reflux disease) See above. GERD precautions reviewed.    Advised if symptoms do not improve or worsen, to please contact office for sooner follow up or seek emergency care.   I spent 35 minutes of dedicated to the care of this patient on the date of this encounter to include pre-visit review of records, face-to-face time with the patient discussing conditions above, post visit ordering of testing, clinical documentation with the electronic health record, making appropriate referrals as documented, and communicating necessary findings to members of the patients care team.  Comer LULLA Rouleau, NP 03/07/2024  Pt aware and understands NP's role.

## 2024-03-08 ENCOUNTER — Other Ambulatory Visit: Payer: Self-pay | Admitting: Physician Assistant

## 2024-03-08 ENCOUNTER — Encounter
Admission: RE | Admit: 2024-03-08 | Discharge: 2024-03-08 | Disposition: A | Discharge status: Home / Self Care | Source: Ambulatory Visit | Attending: Cardiology | Admitting: Cardiology

## 2024-03-08 DIAGNOSIS — I2510 Atherosclerotic heart disease of native coronary artery without angina pectoris: Secondary | ICD-10-CM | POA: Insufficient documentation

## 2024-03-08 DIAGNOSIS — R079 Chest pain, unspecified: Secondary | ICD-10-CM | POA: Insufficient documentation

## 2024-03-08 MED ORDER — TECHNETIUM TC 99M TETROFOSMIN IV KIT
10.7700 | PACK | Freq: Once | INTRAVENOUS | Status: AC | PRN
  Administered 2024-03-08: 10.77 via INTRAVENOUS

## 2024-03-08 MED ORDER — TECHNETIUM TC 99M TETROFOSMIN IV KIT
30.0000 | PACK | Freq: Once | INTRAVENOUS | Status: AC | PRN
  Administered 2024-03-08: 32.76 via INTRAVENOUS

## 2024-03-08 MED ORDER — REGADENOSON 0.4 MG/5ML IV SOLN
0.4000 mg | Freq: Once | INTRAVENOUS | Status: AC
  Administered 2024-03-08: 0.4 mg via INTRAVENOUS

## 2024-03-08 NOTE — Progress Notes (Signed)
     Joy Ball presented for a nuclear stress test today.  I Lesley LITTIE Maffucci, PA-C, provided direct supervision and was present during the stress portion of the study today, which was completed without significant symptoms, immediate complications, or acute ST/T changes on ECG.  Stress imaging is pending at this time.  Preliminary ECG findings may be listed in the chart, but the stress test result will not be finalized until perfusion imaging is complete.  Lesley LITTIE Maffucci, PA-C  03/08/2024, 10:23 AM

## 2024-03-09 ENCOUNTER — Ambulatory Visit
Admission: RE | Admit: 2024-03-09 | Discharge: 2024-03-09 | Disposition: A | Discharge status: Home / Self Care | Source: Ambulatory Visit | Attending: Student in an Organized Health Care Education/Training Program | Admitting: Student in an Organized Health Care Education/Training Program

## 2024-03-09 ENCOUNTER — Ambulatory Visit: Payer: Self-pay | Admitting: Student in an Organized Health Care Education/Training Program

## 2024-03-09 DIAGNOSIS — J8410 Pulmonary fibrosis, unspecified: Secondary | ICD-10-CM | POA: Insufficient documentation

## 2024-03-09 DIAGNOSIS — R0600 Dyspnea, unspecified: Secondary | ICD-10-CM | POA: Insufficient documentation

## 2024-03-09 DIAGNOSIS — J449 Chronic obstructive pulmonary disease, unspecified: Secondary | ICD-10-CM | POA: Diagnosis not present

## 2024-03-09 DIAGNOSIS — R0609 Other forms of dyspnea: Secondary | ICD-10-CM | POA: Diagnosis not present

## 2024-03-09 LAB — NM MYOCAR MULTI W/SPECT W/WALL MOTION / EF
LV dias vol: 33 mL (ref 46–106)
LV sys vol: 15 mL (ref 3.8–5.2)
MPHR: 163 {beats}/min
Nuc Stress EF: 67 %
Peak HR: 117 {beats}/min
Percent HR: 71 %
Rest HR: 87 {beats}/min
Rest Nuclear Isotope Dose: 10.8 mCi
SDS: 0
SRS: 1
SSS: 1
ST Depression (mm): 0 mm
Stress Nuclear Isotope Dose: 32.8 mCi
TID: 1.13

## 2024-03-09 LAB — ECHOCARDIOGRAM COMPLETE
AR max vel: 2.17 cm2
AV Area VTI: 2.16 cm2
AV Area mean vel: 2.17 cm2
AV Mean grad: 4 mmHg
AV Peak grad: 7.2 mmHg
Ao pk vel: 1.34 m/s
Area-P 1/2: 4.41 cm2
S' Lateral: 2.3 cm

## 2024-03-10 ENCOUNTER — Ambulatory Visit: Payer: Self-pay | Admitting: Cardiology

## 2024-03-17 ENCOUNTER — Ambulatory Visit: Payer: Self-pay | Admitting: Nurse Practitioner

## 2024-03-17 NOTE — Progress Notes (Signed)
 Labs stable. Repeat mid October for monitoring

## 2024-03-22 ENCOUNTER — Encounter: Payer: Self-pay | Admitting: Student in an Organized Health Care Education/Training Program

## 2024-03-22 ENCOUNTER — Ambulatory Visit: Admitting: Student in an Organized Health Care Education/Training Program

## 2024-03-22 VITALS — BP 100/58 | HR 100 | Temp 97.1°F | Ht 61.0 in | Wt 117.6 lb

## 2024-03-22 DIAGNOSIS — J8410 Pulmonary fibrosis, unspecified: Secondary | ICD-10-CM

## 2024-03-22 DIAGNOSIS — J9611 Chronic respiratory failure with hypoxia: Secondary | ICD-10-CM | POA: Diagnosis not present

## 2024-03-22 DIAGNOSIS — C3491 Malignant neoplasm of unspecified part of right bronchus or lung: Secondary | ICD-10-CM

## 2024-03-22 NOTE — Progress Notes (Signed)
 Assessment & Plan:   #Pulmonary fibrosis (HCC) (Primary) #Acute Hypoxic Respiratory Failure #Centrilobular Emphysema #COPD  #PRISM    Initially presented for the evaluation of shortness of breath with findings of subpleural reticulation and emphysema on chest imaging. She had high-resolution chest CT in July 2024 showing a pattern consistent with UIP. This could represent UIP secondary to IPF but could also be due to chronic hypersensitivity pneumonitis given significant mold exposure. CPFE is also on the differential.    She was admitted to the hospital with acute hypoxic respiratory failure that I suspect is secondary to COPD exacerbation rather than ILD flare, though that remains on the differential. She was treated with steroids and antibiotics with improvement. Her PFT's had a ratio of 0.73, but I suspect FVC reduction from fibrosis contributes to a ratio that is inappropriately normal.   Auto-immune workup only with a very mildly elevated ANA of 1:40 of unclear significance but otherwise negative titers (ANA reflex, CCP, RF, myomarker). She did not undergo pulmonary resection for malignancy (see below) and we don't have a pathologic diagnosis. Reviewed most recent chest CT which showed severe emphysema and well as above mentioned findings of fibrosis. These are progressed compared to prior, but are stable since last scan. TTE obtained and was re-assuring without signs of RV dysfunction or signs of RV pressure overload to suggest PH.  She was referred for a second opinion with Dr. Geronimo and has been initiated on anti-fibrotic therapy with Pirfenidone . She will continue to follow up with him and has an appointment next month.   - Continue ICS/LABA/LAMA therapy with Breztri  - Continue Pirfenidone , follow up with Dr. Geronimo   #Primary adenocarcinoma of right lung (HCC)   Stage IA2 RUL adenocarcinoma of the lung diagnosed following bronchoscopy and biopsy, now s/p radiation therapy  (not a candidate for surgery). She is followed closely by oncology and radiation oncology. EBUS to stations 11L, 7, 4R and 11R were negative for malignancy. Stage I-A2 (T1b, N0, M0). She had a recent chest CT in August, and repeat is scheduled for February of 2026.  Return in about 1 year (around 03/22/2025).  I spent 30 minutes caring for this patient today, including preparing to see the patient, obtaining a medical history , reviewing a separately obtained history, performing a medically appropriate examination and/or evaluation, counseling and educating the patient/family/caregiver, ordering medications, tests, or procedures, documenting clinical information in the electronic health record, and independently interpreting results (not separately reported/billed) and communicating results to the patient/family/caregiver  Belva November, MD [facility]   End of visit medications:  No orders of the defined types were placed in this encounter.    Current Outpatient Medications:    acetaminophen  (TYLENOL ) 500 MG tablet, Take 1,500 mg by mouth 3 (three) times daily. 2 to 3 times a day., Disp: , Rfl:    albuterol  (VENTOLIN  HFA) 108 (90 Base) MCG/ACT inhaler, INHALE 2 PUFFS INTO THE LUNGS EVERY 6 HOURS AS NEEDED FOR WHEEZING OR SHORTNESS OF BREATH, Disp: 18 g, Rfl: 1   aspirin  EC 81 MG tablet, Take 1 tablet (81 mg total) by mouth daily. Swallow whole., Disp: , Rfl:    budesonide -formoterol  (SYMBICORT ) 160-4.5 MCG/ACT inhaler, Inhale 2 puffs into the lungs daily., Disp: 1 each, Rfl: 12   buPROPion  (WELLBUTRIN  SR) 150 MG 12 hr tablet, TAKE 1 TABLET(150 MG) BY MOUTH TWICE DAILY, Disp: 180 tablet, Rfl: 0   fluticasone  (FLONASE ) 50 MCG/ACT nasal spray, Place 2 sprays into both nostrils daily., Disp:  16 g, Rfl: 0   gabapentin  (NEURONTIN ) 300 MG capsule, TAKE 1 CAPSULE(300 MG) BY MOUTH AT BEDTIME, Disp: 90 capsule, Rfl: 0   ipratropium (ATROVENT ) 0.06 % nasal spray, Place 2 sprays into  both nostrils 4 (four) times daily., Disp: , Rfl:    ipratropium-albuterol  (DUONEB) 0.5-2.5 (3) MG/3ML SOLN, Take 3 mLs by nebulization 2 (two) times daily., Disp: 360 mL, Rfl: 0   loratadine  (CLARITIN ) 10 MG tablet, Take 10 mg by mouth daily., Disp: , Rfl:    moxifloxacin (VIGAMOX) 0.5 % ophthalmic solution, Place 1 drop into the right eye 4 (four) times daily., Disp: , Rfl:    naproxen  (NAPROSYN ) 500 MG tablet, TAKE 1 TABLET(500 MG) BY MOUTH TWICE DAILY WITH A MEAL, Disp: 180 tablet, Rfl: 0   nystatin  (MYCOSTATIN ) 100000 UNIT/ML suspension, SHAKE LIQUID AND TAKE 5 ML BY MOUTH FOUR TIMES DAILY, Disp: 60 mL, Rfl: 2   omeprazole  (PRILOSEC) 40 MG capsule, Take 1 capsule (40 mg total) by mouth in the morning and at bedtime., Disp: 60 capsule, Rfl: 11   ondansetron  (ZOFRAN ) 4 MG tablet, Take 1 tablet (4 mg total) by mouth every 8 (eight) hours as needed for nausea or vomiting. Take this 30 minutes before each pirfenidone  dose, Disp: 90 tablet, Rfl: 1   ondansetron  (ZOFRAN -ODT) 4 MG disintegrating tablet, Take 1 tablet (4 mg total) by mouth every 8 (eight) hours as needed for nausea or vomiting., Disp: 30 tablet, Rfl: 2   Pirfenidone  267 MG TABS, Take 1 tab three times daily for 7 days, then 2 tabs three times daily for 7 days, then 3 tabs three times daily thereafter., Disp: 207 tablet, Rfl: 0   Pirfenidone  267 MG TABS, Take 1 tablet (267 mg total) by mouth with breakfast, with lunch, and with evening meal., Disp: 270 tablet, Rfl: 4   potassium chloride  SA (KLOR-CON  M) 20 MEQ tablet, Take 1 tablet (20 mEq total) by mouth daily., Disp: 7 tablet, Rfl: 0   prednisoLONE acetate (PRED FORTE) 1 % ophthalmic suspension, Place 1 drop into the right eye 4 (four) times daily., Disp: , Rfl:    Tiotropium Bromide  Monohydrate (SPIRIVA  RESPIMAT) 2.5 MCG/ACT AERS, Inhale 2 puffs into the lungs daily., Disp: 4 g, Rfl: 12   predniSONE  (DELTASONE ) 10 MG tablet, Please take Take prednisone  40mg  once daily x 3 days, then 30mg   once daily x 3 days, then 20mg  once daily x 3 days, then prednisone  10mg  once daily  x 3 days and stop (Patient not taking: Reported on 03/22/2024), Disp: 30 tablet, Rfl: 0   predniSONE  (DELTASONE ) 10 MG tablet, 4 x 3 days, 3 x 3 days, 2 x 3 days, 1 x 3 days (Patient not taking: Reported on 03/22/2024), Disp: 30 tablet, Rfl: 0   Subjective:   PATIENT ID: Joy Ball GENDER: female DOB: 11/02/1965, MRN: [id number]  Chief Complaint  Patient presents with   Pulmonary Fibrosis    SOB always. No wheezing. Cough with occasional white/green sputum. Feels dizzy in the mornings when she gets up. 2L O2 at rest and 6L O2 with exertion    HPI  Patient is a pleasant 59 year old female presenting for post hospital discharge follow up.   Patient has been following in our clinic for ILD as well as a lung nodule. She underwent robotic assisted navigational bronchoscopy for biopsy of the RUL nodule in addition to EBUS/TBNA of the hilar and mediastinal lymph nodes on 08/11/2023. Biopsy of the RUL nodule has shown adenocarcinoma, while EBUS to  stations 11L, 7, 4R, and 11R superior was negative for malignancy. She has since been seen by oncology as well as by radiation oncology, and has initiated on radiation therapy of the nodule. She was seen by thoracic surgery and felt high risk candidate for surgery, so lobectomy was deferred.   She'd been seen in 2017 by Dr. Linard, and prior to that by Dr. McQuaid. She had followed with them for both COPD and pulmonary fibrosis. Previous workup has included an auto-immune workup in 2013 that was negative, as well as an alpha -1 genotype that was normal. Her chest CT was from 2016 and showed bilateral lower lobe basilar reticulation. It was also notable for centrilobular emphysema.   She then established care with me in June 2024, I ordered a high-resolution chest CT that was obtained outside of our system with images ported and report noting signs of UIP as well as a  potential pulmonary nodule on my review. We were in the process of obtaining a PET/CT to assess for risk of malignancy but this was delayed given insurance coverage and attempt to transition care.    Patient attempted to transition care to [facility] secondary to insurance coverage issues, but this was difficult. Following change of insurance and difficulty in transition, she re-established in our clinic in January of 2025. I ordered repeat imaging including a PET/CT and repeat chest CT showing growth in the RUL nodule as well as FDG avidity on PET.   Return Visit 11/27/2023: she continues to experience exertional dyspnea and a mild cough. She is compliant with her Breztri  and is using it as prescribed without any issue. She continues to be exposed to mold at her partner's house.   Admission 12/24/2023: She was admitted 7/3-7/11 to [facility] with acute hypoxic respiratory failure, requiring significant oxygen  supplementation with high flow nasal cannula. She was treated with antibiotics as well as with steroids.  CT chest during hospitalization showed extensive emphysema, as well as areas of ground glass in the RML and bilateral lower lobes, suggestive of infection. The CT was negative for clot.   Return Visit 01/07/2024: Presents for post hospital discharge. She continues on a steroid taper post discharge. She feels improved compared to prior, but is not back to her baseline yet. Denies worsening cough or sputum production, and denies wheeze.  Initial Visit with Dr. Geronimo 01/13/2024: Pirfenidone  started.  Return visit with NP Cobb 03/07/2024: Zofran  for N/V side effect of Pirfenidone . Omeprazole  increased to bid.  Return Visit 03/22/2024: Continues to experience exertional dyspnea. Her oxygenation is 92% on 2 liters at rest, but does drop into the low 80's with exertion, and she has to increase her oxygen  via nasal cannula to 6-8 liters. She does not have much in way of cough, and denies wheeze. She does experience  nausea after pirfenidone  was prescribed and she's started on zofran  for that. She has a follow up with Dr. Geronimo next month.  She is a former smoker, and has been smoke-free since September 2024. She previously smoked between 1.5 and 2 packs a day.  She continues to live in the same house with her boyfriend where there is report of mold in the bedroom (water from under the bedroom).  Ancillary information including prior medications, full medical/surgical/family/social histories, and PFTs (when available) are listed below and have been reviewed.    Review of Systems  Constitutional:  Negative for chills, fever and weight loss.  Respiratory:  Positive for shortness of breath (with exertion). Negative for  cough, hemoptysis and wheezing.   Cardiovascular:  Negative for chest pain.     Objective:   Vitals:   03/22/24 1007  BP: (!) 100/58  Pulse: 100  Temp: (!) 97.1 F (36.2 C)  SpO2: 92%  Weight: 117 lb 9.6 oz (53.3 kg)  Height: 5' 1 (1.549 m)   92% on 2 LPM  BMI Readings from Last 3 Encounters:  03/22/24 22.22 kg/m  03/07/24 22.11 kg/m  03/03/24 23.55 kg/m   Wt Readings from Last 3 Encounters:  03/22/24 117 lb 9.6 oz (53.3 kg)  03/07/24 117 lb (53.1 kg)  03/03/24 120 lb 9.6 oz (54.7 kg)    Physical Exam Constitutional:      Appearance: Normal appearance.  Cardiovascular:     Rate and Rhythm: Normal rate and regular rhythm.     Pulses: Normal pulses.     Heart sounds: Normal heart sounds.  Pulmonary:     Effort: Pulmonary effort is normal.     Breath sounds: Rales (bibasilar) present. No wheezing or rhonchi.  Abdominal:     Palpations: Abdomen is soft.  Neurological:     General: No focal deficit present.     Mental Status: She is alert and oriented to person, place, and time. Mental status is at baseline.       Ancillary Information    Past Medical History:  Diagnosis Date   Allergy Idk   Arthritis Idk   Asthma    Cancer (HCC)    Cataract Idk    COPD (chronic obstructive pulmonary disease) (HCC)    Dyspnea    Emphysema lung (HCC)    Emphysema of lung (HCC) Idk   GERD (gastroesophageal reflux disease)    Lung cancer (HCC) 2024   Lung nodule    Migraines    Pulmonary fibrosis (HCC)    Seasonal allergies      Family History  Problem Relation Age of Onset   Lung cancer Father        was a smoker   Cancer Father    Heart disease Maternal Grandfather    Emphysema Mother    Arthritis Mother    Asthma Mother    COPD Mother    Vision loss Mother    Varicose Veins Mother    Miscarriages / [place] Sister    Miscarriages / [place] Sister      Past Surgical History:  Procedure Laterality Date   CESAREAN SECTION     EYE SURGERY Bilateral 01/2023   TUBAL LIGATION      Social History   Socioeconomic History   Marital status: Widowed    Spouse name: Not on file   Number of children: 3   Years of education: Not on file   Highest education level: Some college, no degree  Occupational History   Occupation: Lexicographer houses    Employer: OTHER  Tobacco Use   Smoking status: Former    Current packs/day: 0.00    Average packs/day: 1.5 packs/day for 81.7 years (121.3 ttl pk-yrs)    Types: Cigarettes    Start date: 26    Quit date: 02/22/2023    Years since quitting: 1.0   Smokeless tobacco: Current   Tobacco comments:    Stopped in September of 2024 .hfb RN  Substance and Sexual Activity   Alcohol use: Yes    Alcohol/week: 0.0 standard drinks of alcohol    Comment: occ   Drug use: No   Sexual activity: Not on file  Other Topics Concern  Not on file  Social History Narrative   Not on file   Social Drivers of Health   Financial Resource Strain: Not on file  Food Insecurity: Food Insecurity Present (12/25/2023)   Hunger Vital Sign    Worried About Running Out of Food in the Last Year: Sometimes true    Ran Out of Food in the Last Year: Sometimes true  Transportation Needs: No Transportation Needs (12/25/2023)    PRAPARE - Administrator, Civil Service (Medical): No    Lack of Transportation (Non-Medical): No  Physical Activity: Not on file  Stress: Not on file  Social Connections: Unknown (12/22/2022)   [facility] from [facility]   Social Network    Social Network: Not on file  Intimate Partner Violence: Not At Risk (12/25/2023)   Humiliation, Afraid, Rape, and Kick questionnaire    Fear of Current or Ex-Partner: No    Emotionally Abused: No    Physically Abused: No    Sexually Abused: No     Allergies  Allergen Reactions   Amoxicillin Hives     CBC    Component Value Date/Time   WBC 8.0 12/30/2023 0417   RBC 3.66 (L) 12/30/2023 0417   HGB 13.3 12/30/2023 0417   HGB 15.2 (H) 08/19/2023 1611   HCT 39.7 12/30/2023 0417   PLT 364 12/30/2023 0417   PLT 320 08/19/2023 1611   MCV 108.5 (H) 12/30/2023 0417   MCH 36.3 (H) 12/30/2023 0417   MCHC 33.5 12/30/2023 0417   RDW 13.0 12/30/2023 0417   LYMPHSABS 2.0 12/24/2023 2147   MONOABS 0.9 12/24/2023 2147   EOSABS 0.1 12/24/2023 2147   BASOSABS 0.1 12/24/2023 2147    Pulmonary Functions Testing Results:    Latest Ref Rng & Units 11/23/2023   10:45 AM 08/31/2023    9:59 AM 01/01/2023    2:22 PM  PFT Results  FVC-Pre L 2.17  2.25  2.15   FVC-Predicted Pre % 70  73  69   FVC-Post L  2.26  2.37   FVC-Predicted Post %  73  77   Pre FEV1/FVC % % 73  68  66   Post FEV1/FCV % %  73  71   FEV1-Pre L 1.59  1.53  1.42   FEV1-Predicted Pre % 66  64  58   FEV1-Post L  1.65  1.67   DLCO uncorrected ml/min/mmHg 7.27  6.65  5.35   DLCO UNC% % 39  35  28   DLVA Predicted % 55  47  33   TLC L 3.55  3.47  3.17   TLC % Predicted % 77  75  68   RV % Predicted % 80  69  69     Outpatient Medications Prior to Visit  Medication Sig Dispense Refill   acetaminophen  (TYLENOL ) 500 MG tablet Take 1,500 mg by mouth 3 (three) times daily. 2 to 3 times a day.     albuterol  (VENTOLIN  HFA) 108 (90 Base) MCG/ACT inhaler INHALE 2 PUFFS INTO THE  LUNGS EVERY 6 HOURS AS NEEDED FOR WHEEZING OR SHORTNESS OF BREATH 18 g 1   aspirin  EC 81 MG tablet Take 1 tablet (81 mg total) by mouth daily. Swallow whole.     budesonide -formoterol  (SYMBICORT ) 160-4.5 MCG/ACT inhaler Inhale 2 puffs into the lungs daily. 1 each 12   buPROPion  (WELLBUTRIN  SR) 150 MG 12 hr tablet TAKE 1 TABLET(150 MG) BY MOUTH TWICE DAILY 180 tablet 0   fluticasone  (FLONASE ) 50  MCG/ACT nasal spray Place 2 sprays into both nostrils daily. 16 g 0   gabapentin  (NEURONTIN ) 300 MG capsule TAKE 1 CAPSULE(300 MG) BY MOUTH AT BEDTIME 90 capsule 0   ipratropium (ATROVENT ) 0.06 % nasal spray Place 2 sprays into both nostrils 4 (four) times daily.     ipratropium-albuterol  (DUONEB) 0.5-2.5 (3) MG/3ML SOLN Take 3 mLs by nebulization 2 (two) times daily. 360 mL 0   loratadine  (CLARITIN ) 10 MG tablet Take 10 mg by mouth daily.     moxifloxacin (VIGAMOX) 0.5 % ophthalmic solution Place 1 drop into the right eye 4 (four) times daily.     naproxen  (NAPROSYN ) 500 MG tablet TAKE 1 TABLET(500 MG) BY MOUTH TWICE DAILY WITH A MEAL 180 tablet 0   nystatin  (MYCOSTATIN ) 100000 UNIT/ML suspension SHAKE LIQUID AND TAKE 5 ML BY MOUTH FOUR TIMES DAILY 60 mL 2   omeprazole  (PRILOSEC) 40 MG capsule Take 1 capsule (40 mg total) by mouth in the morning and at bedtime. 60 capsule 11   ondansetron  (ZOFRAN ) 4 MG tablet Take 1 tablet (4 mg total) by mouth every 8 (eight) hours as needed for nausea or vomiting. Take this 30 minutes before each pirfenidone  dose 90 tablet 1   ondansetron  (ZOFRAN -ODT) 4 MG disintegrating tablet Take 1 tablet (4 mg total) by mouth every 8 (eight) hours as needed for nausea or vomiting. 30 tablet 2   Pirfenidone  267 MG TABS Take 1 tab three times daily for 7 days, then 2 tabs three times daily for 7 days, then 3 tabs three times daily thereafter. 207 tablet 0   Pirfenidone  267 MG TABS Take 1 tablet (267 mg total) by mouth with breakfast, with lunch, and with evening meal. 270 tablet 4    potassium chloride  SA (KLOR-CON  M) 20 MEQ tablet Take 1 tablet (20 mEq total) by mouth daily. 7 tablet 0   prednisoLONE acetate (PRED FORTE) 1 % ophthalmic suspension Place 1 drop into the right eye 4 (four) times daily.     Tiotropium Bromide  Monohydrate (SPIRIVA  RESPIMAT) 2.5 MCG/ACT AERS Inhale 2 puffs into the lungs daily. 4 g 12   predniSONE  (DELTASONE ) 10 MG tablet Please take Take prednisone  40mg  once daily x 3 days, then 30mg  once daily x 3 days, then 20mg  once daily x 3 days, then prednisone  10mg  once daily  x 3 days and stop (Patient not taking: Reported on 03/22/2024) 30 tablet 0   predniSONE  (DELTASONE ) 10 MG tablet 4 x 3 days, 3 x 3 days, 2 x 3 days, 1 x 3 days (Patient not taking: Reported on 03/22/2024) 30 tablet 0   No facility-administered medications prior to visit.

## 2024-03-23 DIAGNOSIS — R0602 Shortness of breath: Secondary | ICD-10-CM | POA: Diagnosis not present

## 2024-03-23 DIAGNOSIS — J449 Chronic obstructive pulmonary disease, unspecified: Secondary | ICD-10-CM | POA: Diagnosis not present

## 2024-03-30 ENCOUNTER — Other Ambulatory Visit: Payer: Self-pay

## 2024-03-30 ENCOUNTER — Other Ambulatory Visit: Payer: Self-pay | Admitting: Internal Medicine

## 2024-03-30 ENCOUNTER — Other Ambulatory Visit (HOSPITAL_COMMUNITY): Payer: Self-pay

## 2024-03-30 DIAGNOSIS — J84112 Idiopathic pulmonary fibrosis: Secondary | ICD-10-CM

## 2024-03-30 MED ORDER — PIRFENIDONE 267 MG PO TABS
801.0000 mg | ORAL_TABLET | Freq: Three times a day (TID) | ORAL | 1 refills | Status: DC
  Filled 2024-03-30: qty 270, 30d supply, fill #0

## 2024-03-30 NOTE — Telephone Encounter (Signed)
 Refill sent for ESBRIET  to [facility]: [phone]   Dose: 801mg  by mouth three times daily   Last OV: 03/22/24 Provider: Dr. Geronimo Pertinent labs: LFTs stable 03/07/24  Next OV: 04/07/24  Started pirfenidone  in August. Needs monthly LFTs x 6 months, then every 3 months thereafter. Next labs due now. Messaged patient as reminder. There are active orders.   Aleck Puls, PharmD, BCPS Clinical Pharmacist  [facility]

## 2024-03-30 NOTE — Telephone Encounter (Signed)
 Pt is requesting refill of specialty medication - routing to Rx team to advise.

## 2024-03-30 NOTE — Progress Notes (Signed)
 Specialty Pharmacy Refill Coordination Note  Joy Ball is a 58 y.o. female contacted today regarding refills of specialty medication(s) Pirfenidone    Patient requested Delivery   Delivery date: 03/31/24   Verified address: [address] [place], [address]   Medication will be filled on 03/30/24.

## 2024-03-31 ENCOUNTER — Inpatient Hospital Stay: Admitting: Oncology

## 2024-04-04 ENCOUNTER — Other Ambulatory Visit: Payer: Self-pay | Admitting: Family Medicine

## 2024-04-04 DIAGNOSIS — G8929 Other chronic pain: Secondary | ICD-10-CM

## 2024-04-04 DIAGNOSIS — Z720 Tobacco use: Secondary | ICD-10-CM

## 2024-04-04 DIAGNOSIS — J4521 Mild intermittent asthma with (acute) exacerbation: Secondary | ICD-10-CM

## 2024-04-04 DIAGNOSIS — M7712 Lateral epicondylitis, left elbow: Secondary | ICD-10-CM

## 2024-04-05 DIAGNOSIS — R0602 Shortness of breath: Secondary | ICD-10-CM | POA: Diagnosis not present

## 2024-04-05 NOTE — Telephone Encounter (Signed)
 Courtesy refill. Patient will need an office visit for additional refills.  Requested Prescriptions  Pending Prescriptions Disp Refills   naproxen  (NAPROSYN ) 500 MG tablet [Pharmacy Med Name: NAPROXEN  500MG  TABLETS] 180 tablet 0    Sig: TAKE 1 TABLET(500 MG) BY MOUTH TWICE DAILY WITH A MEAL     Analgesics:  NSAIDS Failed - 04/05/2024  4:34 PM      Failed - Manual Review: Labs are only required if the patient has taken medication for more than 8 weeks.      Passed - Cr in normal range and within 360 days    Creatinine  Date Value Ref Range Status  09/02/2023 0.76 0.44 - 1.00 mg/dL Final   Creat  Date Value Ref Range Status  11/28/2022 0.75 0.50 - 1.03 mg/dL Final   Creatinine, Ser  Date Value Ref Range Status  03/07/2024 0.64 0.40 - 1.20 mg/dL Final         Passed - HGB in normal range and within 360 days    Hemoglobin  Date Value Ref Range Status  12/30/2023 13.3 12.0 - 15.0 g/dL Final  97/73/7974 84.7 (H) 12.0 - 15.0 g/dL Final         Passed - PLT in normal range and within 360 days    Platelets  Date Value Ref Range Status  12/30/2023 364 150 - 400 K/uL Final   Platelet Count  Date Value Ref Range Status  08/19/2023 320 150 - 400 K/uL Final         Passed - HCT in normal range and within 360 days    HCT  Date Value Ref Range Status  12/30/2023 39.7 36.0 - 46.0 % Final         Passed - eGFR is 30 or above and within 360 days    GFR, Est African American  Date Value Ref Range Status  10/29/2016 >89 >=60 mL/min Final   GFR, Est Non African American  Date Value Ref Range Status  10/29/2016 >89 >=60 mL/min Final   GFR, Estimated  Date Value Ref Range Status  01/01/2024 >60 >60 mL/min Final    Comment:    (NOTE) Calculated using the CKD-EPI Creatinine Equation (2021)   09/02/2023 >60 >60 mL/min Final    Comment:    (NOTE) Calculated using the CKD-EPI Creatinine Equation (2021)    GFR  Date Value Ref Range Status  03/07/2024 98.31 >60.00 mL/min Final     Comment:    Calculated using the CKD-EPI Creatinine Equation (2021)   eGFR  Date Value Ref Range Status  11/28/2022 94 > OR = 60 mL/min/1.33m2 Final         Passed - Patient is not pregnant      Passed - Valid encounter within last 12 months    Recent Outpatient Visits           6 months ago Adenocarcinoma of right lung [facility])   [facility] Marsa PARAS, DO       Future Appointments             In 1 month Agbor-Etang, Redell, MD [facility]             buPROPion  (WELLBUTRIN  SR) 150 MG 12 hr tablet [Pharmacy Med Name: BUPROPION  SR 150MG  TABLETS (12 H)] 60 tablet 0    Sig: TAKE 1 TABLET(150 MG) BY MOUTH TWICE DAILY     Psychiatry: Antidepressants - bupropion  Failed - 04/05/2024  4:34  PM      Failed - Valid encounter within last 6 months    Recent Outpatient Visits           6 months ago Adenocarcinoma of right lung [facility])   [facility] [place], Marsa PARAS, DO       Future Appointments             In 1 month Agbor-Etang, Redell, MD [facility] - Cr in normal range and within 360 days    Creatinine  Date Value Ref Range Status  09/02/2023 0.76 0.44 - 1.00 mg/dL Final   Creat  Date Value Ref Range Status  11/28/2022 0.75 0.50 - 1.03 mg/dL Final   Creatinine, Ser  Date Value Ref Range Status  03/07/2024 0.64 0.40 - 1.20 mg/dL Final         Passed - AST in normal range and within 360 days    AST  Date Value Ref Range Status  03/07/2024 21 0 - 37 U/L Final  08/19/2023 45 (H) 15 - 41 U/L Final         Passed - ALT in normal range and within 360 days    ALT  Date Value Ref Range Status  03/07/2024 9 0 - 35 U/L Final  08/19/2023 32 0 - 44 U/L Final         Passed - Last BP in normal range    BP Readings from Last 1 Encounters:  03/22/24 (!) 100/58          VENTOLIN  HFA 108 (90 Base) MCG/ACT inhaler  [Pharmacy Med Name: VENTOLIN  HFA INH W/DOS CTR 200PUFFS] 18 g 1    Sig: INHALE 2 PUFFS INTO THE LUNGS EVERY 6 HOURS AS NEEDED FOR WHEEZING OR SHORTNESS OF BREATH     Pulmonology:  Beta Agonists 2 Passed - 04/05/2024  4:34 PM      Passed - Last BP in normal range    BP Readings from Last 1 Encounters:  03/22/24 (!) 100/58         Passed - Last Heart Rate in normal range    Pulse Readings from Last 1 Encounters:  03/22/24 100         Passed - Valid encounter within last 12 months    Recent Outpatient Visits           6 months ago Adenocarcinoma of right lung [facility])   [facility] Marsa PARAS, DO       Future Appointments             In 1 month Agbor-Etang, Redell, MD [facility]

## 2024-04-07 ENCOUNTER — Telehealth: Payer: Self-pay | Admitting: Internal Medicine

## 2024-04-07 ENCOUNTER — Ambulatory Visit

## 2024-04-07 ENCOUNTER — Ambulatory Visit (INDEPENDENT_AMBULATORY_CARE_PROVIDER_SITE_OTHER): Admitting: Internal Medicine

## 2024-04-07 ENCOUNTER — Encounter: Payer: Self-pay | Admitting: Internal Medicine

## 2024-04-07 VITALS — BP 86/58 | HR 95 | Ht 61.0 in | Wt 119.0 lb

## 2024-04-07 DIAGNOSIS — Z23 Encounter for immunization: Secondary | ICD-10-CM | POA: Diagnosis not present

## 2024-04-07 DIAGNOSIS — J84112 Idiopathic pulmonary fibrosis: Secondary | ICD-10-CM

## 2024-04-07 DIAGNOSIS — R911 Solitary pulmonary nodule: Secondary | ICD-10-CM

## 2024-04-07 DIAGNOSIS — J432 Centrilobular emphysema: Secondary | ICD-10-CM

## 2024-04-07 DIAGNOSIS — Z5181 Encounter for therapeutic drug level monitoring: Secondary | ICD-10-CM

## 2024-04-07 DIAGNOSIS — J9611 Chronic respiratory failure with hypoxia: Secondary | ICD-10-CM | POA: Diagnosis not present

## 2024-04-07 DIAGNOSIS — C3491 Malignant neoplasm of unspecified part of right bronchus or lung: Secondary | ICD-10-CM

## 2024-04-07 DIAGNOSIS — J4489 Other specified chronic obstructive pulmonary disease: Secondary | ICD-10-CM

## 2024-04-07 LAB — PULMONARY FUNCTION TEST
DL/VA % pred: 44 %
DL/VA: 1.94 ml/min/mmHg/L
DLCO unc % pred: 22 %
DLCO unc: 4.24 ml/min/mmHg
FEF 25-75 Pre: 0.68 L/s
FEF2575-%Pred-Pre: 29 %
FEV1-%Pred-Pre: 40 %
FEV1-Pre: 0.95 L
FEV1FVC-%Pred-Pre: 93 %
FEV6-%Pred-Pre: 43 %
FEV6-Pre: 1.28 L
FEV6FVC-%Pred-Pre: 103 %
FVC-%Pred-Pre: 42 %
FVC-Pre: 1.28 L
Pre FEV1/FVC ratio: 74 %
Pre FEV6/FVC Ratio: 100 %
RV % pred: 48 %
RV: 0.86 L
TLC % pred: 47 %
TLC: 2.17 L

## 2024-04-07 LAB — HEPATIC FUNCTION PANEL
ALT: 10 U/L (ref 0–35)
AST: 19 U/L (ref 0–37)
Albumin: 3.8 g/dL (ref 3.5–5.2)
Alkaline Phosphatase: 122 U/L — ABNORMAL HIGH (ref 39–117)
Bilirubin, Direct: 0.1 mg/dL (ref 0.0–0.3)
Total Bilirubin: 0.2 mg/dL (ref 0.2–1.2)
Total Protein: 6.1 g/dL (ref 6.0–8.3)

## 2024-04-07 NOTE — Progress Notes (Signed)
 January 07, 2024 with Dr. KD in [place] pulmonary  #Pulmonary fibrosis [facility]) (Primary) #Acute Hypoxic Respiratory Failure #Centrilobular Emphysema #COPD  #PRISM   Initially presented for the evaluation of shortness of breath with findings of subpleural reticulation and emphysema on chest imaging. She had high-resolution chest CT in July 2024 showing a pattern consistent with UIP. This could represent UIP secondary to IPF but could also be due to chronic hypersensitivity pneumonitis given significant mold exposure. CPFE is also on the differential.   She was recently admitted to the hospital with acute hypoxic respiratory failure that I suspect is secondary to COPD exacerbation rather than ILD flare, though that remains on the differential. She was treated with steroids and antibiotics with improvement. Her PFT's had a ratio of 0.73, but I suspect FVC reduction from fibrosis contributes to a ratio that is inappropriately normal.   Auto-immune workup was essentially non-revealing, with only a very mildly elevated ANA of 1:40 of unclear significance but otherwise negative titers (ANA reflex, CCP, RF, myomarker). She did not undergo pulmonary resection for malignancy (see below) and we don't have a pathologic diagnosis.    I have referred the patient for a second opinion in ILD clinic with Dr. Geronimo for consideration of initiation of antifibrotics. She would not be a candidate for immune suppression at this time given her very recent diagnosis of malignancy. Today, I will order repeat TTE to assess for any pulmonary hypertension.   - ECHOCARDIOGRAM COMPLETE; Future - Continue ICS/LABA/LAMA therapy with Breztri  - Follow up in ILD clinic, visit scheduled for next week.  #Primary adenocarcinoma of right lung (HCC)   Stage IA2 RUL adenocarcinoma of the lung diagnosed following bronchoscopy and biopsy, now s/p radiation therapy (not a candidate for surgery). She is followed closely by  oncology and radiation oncology. EBUS to stations 11L, 7, 4R and 11R were negative for malignancy. Stage I-A2 (T1b, N0, M0).   OV 01/14/2024 -  Evaluation of interstitial lung disease center by Dr. Geronimo.  Referred by Dr. Belva Fake of [facility].  Subjective:  Patient ID: Joy Ball, female , DOB: [birth year] , age 58 y.o. , MRN: [id number] , ADDRESS: [address] [place] [address] PCP Joy Marsa PARAS, DO Patient Care Team: Joy Marsa PARAS, DO as PCP - General (Family Medicine) Joy Belva, MD as Consulting Physician (Pulmonary Disease) Joy Gills, RN as Oncology Nurse Navigator Joy Call, MD as Consulting Physician (Oncology)  This Provider for this visit: Treatment Team:  Attending Provider: Geronimo Amel, MD    01/14/2024 -   Chief Complaint  Patient presents with   Medical Management of Chronic Issues   Interstitial Lung Disease    [name]. She gets light headed and SOB walking to room to room at home. Sats dropping into 60's on o2 4lpm with minimal exertion.      HPI Joy Ball 75 y.o. -presents with her neighbor and granddaughter.  She is a former heavy smoker quit in September 2024.  She says she has been aware that she has had pulmonary fibrosis/interstitial lung disease since at least 2013.  She says possibly Dr. Creston took care of and the approach was supportive care.  She has a strong family history of pulmonary fibrosis including her maternal grandmother, her sister was 54 and her sister is 59 years old.  The 1/62 is now on oxygen .  For years has been observation therapy.  It looks like she underwent regular CT scans and  in early 2025 got diagnosed with a new nodule and then she underwent radiation for stage I non-small cell lung cancer.  She is completed radiation and April 2025.  Review of images indicate this ILD was likely stable but then started progressing by the summer 2024 and then a  significant progression by early 2025.  The pattern of ILD was initially indeterminate but now is probable UIP/possibly definitive UIP with early honeycombing.    Then after the radiation she got hospitalized for acute on chronic hypoxemic respiratory failure deemed this is COPD exacerbation but CT angiogram images from July 2025 show groundglass opacities and since then she has got persistent new hypoxemia even at rest.  Therefore she possibly had a ILD flareup probably related to radiation or virus.  She was not tested extensively with the respiratory virus panel of note.  She is using 4 L oxygen  at rest at home but today she is 82% room air at rest and corrects to 91% with 2 L at rest.  Her serology panel is essentially negative except for trace positive ANA.  An echocardiogram is pending CT scan of the chest also shows coronary artery calcification but she denies any chest pain.  Recent troponin was normal  In terms of exposure history she lives in 57 year old house.  She has been living there for 7 years.  She says she can smell mold in the house itself.  In particular there is 1 bedroom where there is mold in the crawlspace but she says when she lifts her shoes or any furniture that she can see mold even at the carpet level.     CT Chest data from date: July 31, 2023  - personally visualized and independently interpreted : yes - my findings are: yes agree  IMPRESSION: 1. The 1.9 cm right upper lobe nodule along the inferior margin of the of the right upper lobe scarring is hypermetabolic on today's PET-CT, concerning for primary lung malignancy, or less likely active granulomatous process. 2. Extensive centrilobular emphysema. 3. Fibrosis and mild honeycombing in the lung bases, UIP pattern. 4. Hepatic steatosis. 5. Aortic and coronary atherosclerosis.   Aortic Atherosclerosis (ICD10-I70.0) and Emphysema (ICD10-J43.9).     Electronically Signed   By: Joy Ball  M.D.   On: 08/12/2023 15:02  03/07/2024: Today - follow up Discussed the use of AI scribe software for clinical note transcription with the patient, who gave verbal consent to proceed.  History of Present Illness Joy Ball is a 58 year old female with pulmonary fibrosis who presents for follow up. She is accompanied by her boyfriend.  She experiences nausea and takes Zofran  30 minutes before her pirfenidone  doses, primarily in the morning and at night. Occasional vomiting had occurred prior to starting, but Zofran  makes the nausea more tolerable.  She experiences acid reflux and takes over-the-counter omeprazole , doubling the dose to 40 mg to manage symptoms. This helps, but the cost is becoming prohibitive.   She is on pirfenidone , taking three pills three times a day, spaced five to six hours apart. A dry cough is present and stable. She uses oxygen  therapy, requiring two liters at rest and 4-6 liters when active. Despite this, her oxygen  levels drop to 60-70s at home. Pulmonary rehab has not started due to her boyfriend's surgery. She needs to Ball them back to reschedule. Weight loss is noted, from 125 pounds in July to 117 pounds currently. Her breathing feels stable. No night sweats, fevers, wheezing, chest congestion.  She experiences occasional ankle swelling, when she's on her feet for longer periods. She is scheduled for a nuclear medicine test to assess heart function. She uses Symbicort , Spiriva , and albuterol  as needed, with albuterol  used once every other day, which she finds helpful.    OV 04/07/2024  Subjective:  Patient ID: Joy Ball, female , DOB: 1965/08/02 , age 30 y.o. , MRN: [id number] , ADDRESS: [address] [place] [address] PCP Joy Marsa PARAS, DO Patient Care Team: Joy Marsa PARAS, DO as PCP - General (Family Medicine) Joy Hose, MD as Consulting Physician (Pulmonary Disease) Joy Gills, RN as Oncology Nurse Navigator Joy Call, MD as Consulting Physician (Oncology)  This Provider for this visit: Treatment Team:  Attending Provider: Geronimo Amel, MD    04/07/2024 -   Chief Complaint  Patient presents with   Medical Management of Chronic Issues   Interstitial Lung Disease    PFT repeated today. Her breathing is unchanged since the last visit. She was seen by Dr. Isadora 03/22/24.    #IPF - dx July 2025, kast CT Aug 2025  - - Though there is mold exposure at home and giving her diagnosis of idiopathic pulmonary fibrosis [IPF] based on the fact there is strong family history, lower lobe predominant disease that has progressed.  Recent radiation and ongoing mold exposure and possible viral issues in the summer 2025 have resulted in progression of disease.  I believe progression happened between July 2024 and February 2025 and accelerated much more after hospitalization in July 2025.  Primary adenocarcinoma of right lung (HCC)   Stage IA2 RUL adenocarcinoma of the lung diagnosed following bronchoscopy and biopsy, now s/p radiation therapy (not a candidate for surgery). She is followed closely by oncology and radiation oncology. EBUS to stations 11L, 7, 4R and 11R were negative for malignancy. Stage I-A2 (T1b, N0, M0). She had a recent chest CT in August, and repeat is scheduled for February of 2026.  #chronic resp failure (2L rest, 6L exetion)  #Esbriet /Pirfenidone  requires intensive drug monitoring due to high concerns for Adverse effects of , including  Drug Induced Liver Injury, significant GI side effects that include but not limited to Diarrhea, Nausea, Vomiting,  and other system side effects that include Fatigue, headaches, weight loss and other side effects such as skin rash. These will be monitored with  blood work such as LFT initially once a month for 6 months and then quarterly   HPI Joy Ball 58 y.o. -presents for physical.  Last seen in July 2025.  At that time diagnosed IPF.  Started on  pirfenidone  full dose.  Initially she had nausea but even now she does have nausea but she is able to control with just 1 Zofran  in the morning she is on full dose 3 pills 3 times daily.  Since her last visit Interim Health status: No new complaints No new medical problems. No new surgeries. No ER visits. No Urgent care visits. No changes to medications  Issues include - Class III dyspnea.  Her friend Reena who is with as independent historian she had just the same.  She not even able to go to the bathroom without desaturating to the 70s although quickly comes up.  The oxygen  is 2 L at rest with 6 L exertion and does not cut it.  -IPF: It is progressive pulmonary function test today shows continued progression despite starting pirfenidone  in July 2025.  Subjective she feels the same.  Discussed Nerandomilast and  she is interested.  Discharge to pharmacist Aleck Puls.  -Vaccine counseling: She will have flu shot today and Prevnar today.  She has never had this before especially Prevnar.  Recommend she have shingles and RSV vaccine on her own and COVID-vaccine on her own.  -Lung cancer: Status post radiation.  She is seeing Dr. Philipp as of September 2025.  Emphysema: She is on inhalers  -Chronic respiratory failure: 2 L at rest 6 L with exertion and still desaturates.  Echocardiogram did not show any evidence of PAH but I think this absolutely needs to be ruled out because of indication to treat with treprostinil which would potentially beneficial for both pulmonary hypertension and fibrosis.  I reached out to her cardiologist Dr. Budd Kindle  SYMPTOM SCALE - ILD 01/14/2024 04/07/2024   Current weight  Esbreiot, 2L rest, 6L ex  O2 use RA 82% at rest, 2L 91%, she has bee using 4   Shortness of Breath 0 -> 5 scale with 5 being worst (score 6 If unable to do)   At rest 1 2  Simple tasks - showers, clothes change, eating, shaving 5 5  Household (dishes, doing bed, laundry) 5 5  Shopping Does  not do 5  Walking level at own pace 5 4  Walking up Stairs 5 5  Total (30-36) Dyspnea Score 21 26  How bad is your cough? 1 2  How bad is your fatigue 5 3  How bad is appetiee 0 2  How bad is nausea 00 3  How bad is vomiting?  0 1  How bad is diarrhea? 0 1  How bad is anxiety? 0 2  How bad is depression 0 1  Any chronic pain - if so where and how bad 00 0      PFT     Latest Ref Rng & Units 04/07/2024    9:14 AM 11/23/2023   10:45 AM 08/31/2023    9:59 AM 01/01/2023    2:22 PM  PFT Results  FVC-Pre L 1.28  P 2.17  2.25  2.15   FVC-Predicted Pre % 42  P 70  73  69   FVC-Post L   2.26  2.37   FVC-Predicted Post %   73  77   Pre FEV1/FVC % % 74  P 73  68  66   Post FEV1/FCV % %   73  71   FEV1-Pre L 0.95  P 1.59  1.53  1.42   FEV1-Predicted Pre % 40  P 66  64  58   FEV1-Post L   1.65  1.67   DLCO uncorrected ml/min/mmHg 4.24  P 7.27  6.65  5.35   DLCO UNC% % 22  P 39  35  28   DLVA Predicted % 44  P 55  47  33   TLC L 2.17  P 3.55  3.47  3.17   TLC % Predicted % 47  P 77  75  68   RV % Predicted % 48  P 80  69  69     P Preliminary result       LAB RESULTS last 96 hours No results found.       has a past medical history of Allergy (Idk), Arthritis (Idk), Asthma, Cancer (HCC), Cataract (Idk), COPD (chronic obstructive pulmonary disease) (HCC), Dyspnea, Emphysema lung (HCC), Emphysema of lung (HCC) (Idk), GERD (gastroesophageal reflux disease), Lung cancer (HCC) (2024), Lung nodule, Migraines, Pulmonary fibrosis (HCC), and Seasonal allergies.  reports that she quit smoking about 13 months ago. Her smoking use included cigarettes. She started smoking about 40 years ago. She has a 121.3 Ball-year smoking history. She uses smokeless tobacco.  Past Surgical History:  Procedure Laterality Date   CESAREAN SECTION     EYE SURGERY Bilateral 01/2023   TUBAL LIGATION      Allergies  Allergen Reactions   Amoxicillin Hives    Immunization History  Administered  Date(s) Administered   Influenza,inj,Quad PF,6+ Mos 07/27/2015   Influenza-Unspecified 02/24/2014    Family History  Problem Relation Age of Onset   Lung cancer Father        was a smoker   Cancer Father    Heart disease Maternal Grandfather    Emphysema Mother    Arthritis Mother    Asthma Mother    COPD Mother    Vision loss Mother    Varicose Veins Mother    Miscarriages / [place] Sister    Miscarriages / [place] Sister      Current Outpatient Medications:    acetaminophen  (TYLENOL ) 500 MG tablet, Take 1,500 mg by mouth 3 (three) times daily. 2 to 3 times a day., Disp: , Rfl:    aspirin  EC 81 MG tablet, Take 1 tablet (81 mg total) by mouth daily. Swallow whole., Disp: , Rfl:    budesonide -formoterol  (SYMBICORT ) 160-4.5 MCG/ACT inhaler, Inhale 2 puffs into the lungs daily., Disp: 1 each, Rfl: 12   buPROPion  (WELLBUTRIN  SR) 150 MG 12 hr tablet, TAKE 1 TABLET(150 MG) BY MOUTH TWICE DAILY, Disp: 60 tablet, Rfl: 0   fluticasone  (FLONASE ) 50 MCG/ACT nasal spray, Place 2 sprays into both nostrils daily., Disp: 16 g, Rfl: 0   gabapentin  (NEURONTIN ) 300 MG capsule, TAKE 1 CAPSULE(300 MG) BY MOUTH AT BEDTIME, Disp: 90 capsule, Rfl: 0   ipratropium (ATROVENT ) 0.06 % nasal spray, Place 2 sprays into both nostrils 4 (four) times daily., Disp: , Rfl:    ipratropium-albuterol  (DUONEB) 0.5-2.5 (3) MG/3ML SOLN, Take 3 mLs by nebulization 2 (two) times daily., Disp: 360 mL, Rfl: 0   loratadine  (CLARITIN ) 10 MG tablet, Take 10 mg by mouth daily., Disp: , Rfl:    naproxen  (NAPROSYN ) 500 MG tablet, TAKE 1 TABLET(500 MG) BY MOUTH TWICE DAILY WITH A MEAL, Disp: 180 tablet, Rfl: 0   nystatin  (MYCOSTATIN ) 100000 UNIT/ML suspension, SHAKE LIQUID AND TAKE 5 ML BY MOUTH FOUR TIMES DAILY, Disp: 60 mL, Rfl: 2   omeprazole  (PRILOSEC) 40 MG capsule, Take 1 capsule (40 mg total) by mouth in the morning and at bedtime., Disp: 60 capsule, Rfl: 11   ondansetron  (ZOFRAN ) 4 MG tablet, Take 1 tablet (4 mg  total) by mouth every 8 (eight) hours as needed for nausea or vomiting. Take this 30 minutes before each pirfenidone  dose, Disp: 90 tablet, Rfl: 1   ondansetron  (ZOFRAN -ODT) 4 MG disintegrating tablet, Take 1 tablet (4 mg total) by mouth every 8 (eight) hours as needed for nausea or vomiting., Disp: 30 tablet, Rfl: 2   Pirfenidone  267 MG TABS, Take 3 tablets (801 mg total) by mouth in the morning, at noon, and at bedtime. ** Maintenance dose ** Please repeat labs each month., Disp: 270 tablet, Rfl: 1   Tiotropium Bromide  Monohydrate (SPIRIVA  RESPIMAT) 2.5 MCG/ACT AERS, Inhale 2 puffs into the lungs daily., Disp: 4 g, Rfl: 12   VENTOLIN  HFA 108 (90 Base) MCG/ACT inhaler, INHALE 2 PUFFS INTO THE LUNGS EVERY 6 HOURS AS NEEDED FOR WHEEZING OR SHORTNESS OF BREATH, Disp: 18  g, Rfl: 1   moxifloxacin (VIGAMOX) 0.5 % ophthalmic solution, Place 1 drop into the right eye 4 (four) times daily. (Patient not taking: Reported on 04/07/2024), Disp: , Rfl:    prednisoLONE acetate (PRED FORTE) 1 % ophthalmic suspension, Place 1 drop into the right eye 4 (four) times daily. (Patient not taking: Reported on 04/07/2024), Disp: , Rfl:       Objective:   Vitals:   04/07/24 1014  BP: (!) 86/58  Pulse: 95  SpO2: 95%  Weight: 119 lb (54 kg)  Height: 5' 1 (1.549 m)    Estimated body mass index is 22.48 kg/m as calculated from the following:   Height as of this encounter: 5' 1 (1.549 m).   Weight as of this encounter: 119 lb (54 kg).  @WEIGHTCHANGE @  Filed Weights   04/07/24 1014  Weight: 119 lb (54 kg)     Physical Exam   General: No distress. Look same O2 at rest: yes Cane present: no Sitting in wheel chair: no Frail: no Obese: no Neuro: Alert and Oriented x 3. GCS 15. Speech normal Psych: Pleasant Resp:  Barrel Chest - no.  Wheeze - no, Crackles - yes base, No overt respiratory distress CVS: Normal heart sounds. Murmurs - no Ext: Stigmata of Connective Tissue Disease - no HEENT: Normal upper  airway. PEERL +. No post nasal drip        Assessment/     Assessment & Plan IPF (idiopathic pulmonary fibrosis) (HCC)  Encounter for therapeutic drug monitoring  Centrilobular emphysema (HCC)  Chronic respiratory failure with hypoxia (HCC)  Flu vaccine need    PLAN Patient Instructions  IPF (idiopathic pulmonary fibrosis) (HCC) History of therapeutic radiation Family history of pulmonary fibrosis Mold exposure Encounter for therapeutic drug monitoring  - Progressive Phenotype of IPF despite pirfenidone   Plan - Continue pirfenidone   -.  Nerandomilast START [newly approved drg) - At some point will refer to genetics counselor and discuss other pillars and treatment - check LFT 04/07/2024   Adenocarcinoma of right lung (HCC) stage I-status post radiation spring 2025  Plan - According to radiation oncology   Pulmonary emphysema, unspecified emphysema type (HCC)  Plan  - Continue Spiriva  and Symbicort  schedule - Continue albuterol  as needed    Chronic respiratory failure with hypoxia (HCC)  Plan -contnue o2 2L [place] at rest and 4-6L With exertiona - you need Right heart cath rule out Pulmonary hypertension (sent message to Dr Budd Kindle)  Flu vaccine need  Plan Flu shot 04/07/2024 PRevnar vaccine 04/07/2024 RSV vaccine on your own COVID-vaccine on your own Please talk to PCP Joy, Marsa PARAS, DO -  and ensure you get  shingrix (GSK) inactivated vaccine against shingles    Follow-up - 6 weeks with nurse practitioner to figure out pirfenidone  and Nerandomilast uptake [can be in [place]] - 12 weeks with Dr. Geronimo 30-minute visit    FOLLOWUP    Return for 6 weeks nurse practitioner in 12 weeks with Dr. Geronimo 30-minute visit.    SIGNATURE    Dr. Dorethia Joy, M.D., F.C.C.P,  Pulmonary and Critical Care Medicine Staff Physician, [facility] Center Director - Interstitial Lung Disease  Program  Pulmonary  Fibrosis [facility] at [facility] [place], [place], [address]  Pager: [phone], If no answer or between  15:00h - 7:00h: Ball [phone] Telephone: [phone]  10:54 AM 04/07/2024   HIGh Complexity  OFFICE   2021 E/M guidelines,  first released in 2021, with minor revisions added in 2023. Must meet the requirements for 2 out of 3 dimensions to qualify.    Number and complexity of problems addressed Amount and/or complexity of data reviewed Risk of complications and/or morbidity  Severe exacerbation of chronic illness  Acute or chronic illnesses that may pose a threat to life or bodily function, e.g., multiple trauma, acute MI, pulmonary embolus, severe respiratory distress, progressive rheumatoid arthritis, psychiatric illness with potential threat to self or others, peritonitis, acute renal failure, abrupt change in neurological status Must meet the requirements for 2 of 3 of the categories)  Category 1: Tests and documents, historian  Any combination of 3 of the following:  Assessment requiring an independent historian    Review of results of each unique test  Ordering of each unique test    Category 2: Interpretation of tests    Independent interpretation of a test performed by another physician/other qualified health care professional (not separately reported)  Category 3: Discuss management/tests  Discussion of management or test interpretation with external physician/other qualified health care professional/appropriate source (not separately reported)  HIGH risk of morbidity from additional diagnostic testing or treatment Examples only:  Drug therapy requiring intensive monitoring for toxicity  Decision for elective major surgery with identified pateint or procedure risk factors  Decision regarding hospitalization or escalation of level of care  Decision for DNR or to de-escalate care   Parenteral controlled  substances

## 2024-04-07 NOTE — Addendum Note (Signed)
 Addended by: Jersie Beel M on: 04/07/2024 11:41 AM   Modules accepted: Orders

## 2024-04-07 NOTE — Progress Notes (Signed)
 Full pft w/o post completed today

## 2024-04-07 NOTE — Patient Instructions (Addendum)
 IPF (idiopathic pulmonary fibrosis) (HCC) History of therapeutic radiation Family history of pulmonary fibrosis Mold exposure Encounter for therapeutic drug monitoring  - Progressive Phenotype of IPF despite pirfenidone   Plan - Continue pirfenidone   -.  Nerandomilast START [newly approved drg) - At some point will refer to genetics counselor and discuss other pillars and treatment - check LFT 04/07/2024   Adenocarcinoma of right lung (HCC) stage I-status post radiation spring 2025  Plan - According to radiation oncology   Pulmonary emphysema, unspecified emphysema type (HCC)  Plan  - Continue Spiriva  and Symbicort  schedule - Continue albuterol  as needed    Chronic respiratory failure with hypoxia (HCC)  Plan -contnue o2 2L  at rest and 4-6L With exertiona - you need Right heart cath rule out Pulmonary hypertension (sent message to Dr Budd Kindle)  Flu vaccine need  Plan Flu shot 04/07/2024 PRevnar vaccine 04/07/2024 RSV vaccine on your own COVID-vaccine on your own Please talk to PCP [place], Marsa PARAS, DO -  and ensure you get  shingrix (GSK) inactivated vaccine against shingles    Follow-up - 6 weeks with nurse practitioner to figure out pirfenidone  and Nerandomilast uptake [can be in [place]] - 12 weeks with Dr. Geronimo 30-minute visit

## 2024-04-07 NOTE — Patient Instructions (Signed)
 Full pft w/o post completed today

## 2024-04-07 NOTE — Telephone Encounter (Signed)
   Joy Ball    Dr Agbor-Etang - she need RHC despite normal echo given high pre test prob for Group 3 PAH.  If you can kindly facilitate that will be great   Thanks    SIGNATURE    Dr. Dorethia Cave, M.D., F.C.C.P,  Pulmonary and Critical Care Medicine Staff Physician, [facility] Center Director - Interstitial Lung Disease  Program  Pulmonary Fibrosis [facility] at [facility] [place], [place], [address]   Pager: [phone], If no answer  -> Check AMION or Try [phone] Telephone (clinical office): [phone] Telephone (research): [phone]  10:53 AM 04/07/2024

## 2024-04-07 NOTE — Telephone Encounter (Signed)
  Joy Ball IPF is progressing despite pirfenidone .  She is hypoxemic.  She will benefit from Nerandomilast.  Please facilitate

## 2024-04-08 ENCOUNTER — Telehealth: Payer: Self-pay

## 2024-04-08 DIAGNOSIS — J84112 Idiopathic pulmonary fibrosis: Secondary | ICD-10-CM

## 2024-04-08 NOTE — Telephone Encounter (Signed)
 Received notification via telephone encounter that pt is Joy Ball new start. Submitted a Prior Authorization request to [facility] MEDICAID for Joy Ball via CoverMyMeds. Will update once we receive a response.  Key: Joy Ball

## 2024-04-11 ENCOUNTER — Telehealth: Payer: Self-pay | Admitting: Emergency Medicine

## 2024-04-11 ENCOUNTER — Telehealth: Payer: Self-pay | Admitting: Cardiology

## 2024-04-11 NOTE — Telephone Encounter (Signed)
 Called patient to go over appt information. Appt made next Tuesday.

## 2024-04-11 NOTE — Telephone Encounter (Signed)
 Received a fax regarding Prior Authorization from [facility] MEDICAID for Joy Ball. Authorization has been DENIED because pt must try at least 2 of the preferred drugs. Pt has already tried pirfenidone  but must also try Ofev.   Phone# [phone]  Dr. Geronimo would you like to try to appeal or consider Ofev?

## 2024-04-11 NOTE — Telephone Encounter (Signed)
 Called and spoke with the patient to inquire why she wanted an earlier appointment.  Patient states that she was returning call from Summer wanting to schedule an appointment with Dr. Darliss.  Patient also states that her pulmonary doctor recommends that she get a left heart cath and this may be the reason why Summer may have been calling.  Informed the patient that this RN will forward the message to Summer to follow up with.

## 2024-04-11 NOTE — Telephone Encounter (Signed)
 Pt returning call for sooner appt. Please advise.

## 2024-04-12 ENCOUNTER — Telehealth: Payer: Self-pay

## 2024-04-12 DIAGNOSIS — C3491 Malignant neoplasm of unspecified part of right bronchus or lung: Secondary | ICD-10-CM

## 2024-04-12 DIAGNOSIS — Z5181 Encounter for therapeutic drug level monitoring: Secondary | ICD-10-CM

## 2024-04-12 DIAGNOSIS — J84112 Idiopathic pulmonary fibrosis: Secondary | ICD-10-CM

## 2024-04-12 NOTE — Telephone Encounter (Signed)
 Due to [facility] denial requiring trial of Ofev and per message with Dr. Geronimo in separate thread, starting Ofev benefits investigation in this encounter.   Aleck Puls, PharmD, BCPS, CPP Clinical Pharmacist  [facility]

## 2024-04-12 NOTE — Telephone Encounter (Signed)
 Will initiate Ofev benefits investigation in new encounter.

## 2024-04-12 NOTE — Telephone Encounter (Signed)
*  Thank you Aleck.  Let us  try nintedanib 100 mg twice daily low-dose protocol and see if she is tolerating this well.

## 2024-04-13 NOTE — Telephone Encounter (Signed)
 Submitted a Prior Authorization request to [facility] MEDICAID for OFEV via CoverMyMeds. Will update once we receive a response.  Key: Joy Ball

## 2024-04-14 ENCOUNTER — Other Ambulatory Visit (HOSPITAL_COMMUNITY): Payer: Self-pay

## 2024-04-14 ENCOUNTER — Other Ambulatory Visit: Payer: Self-pay

## 2024-04-14 MED ORDER — OFEV 100 MG PO CAPS: 100.0000 mg | ORAL_CAPSULE | Freq: Two times a day (BID) | ORAL | 2 refills | Status: DC

## 2024-04-14 NOTE — Telephone Encounter (Signed)
 Last OV with Dr. Geronimo was on 04/07/24. Progressive Phenotype of IPF despite pirfenidone . Plan to continue pirfenidone  and start nerandomilast. However, benefits investigation results with insurance denial of nerandomilast due to patient has not yet trialed Ofev. See phone note 04/08/24 - per Dr. Geronimo, trial low-dose Ofev.   Patient counseled on purpose, proper use, and potential adverse effects including diarrhea, nausea, vomiting, abdominal pain, decreased appetite, weight loss, and increased blood pressure. Stressed the importance of routine lab monitoring. Will monitor LFT's every month for the first 6 months of treatment then every 3 months.   First set of labs in 1 month - patient has appointment with cancer center in November. Plans to obtain at that time. Hepatic function panel already ordered by Dr. Geronimo.  Labs: LFTs 04/07/24 stable   Rx triaged to [facility]: [phone].  Plan: - Stop taking pirfenidone .  - Start taking Ofev 100mg  capsule every 12 hours with food. Stressed importance of taking with food to minimize stomach upset.  - Follow-up with Joy Rouleau, NP,  on 05/26/24.  Joy Ball, PharmD, BCPS, CPP Clinical Pharmacist  [facility]

## 2024-04-14 NOTE — Telephone Encounter (Signed)
 Received notification from [facility] MEDICAID regarding a prior authorization for OFEV. Authorization has been APPROVED from 04/13/24 to 04/13/25. Approval letter sent to scan center.  Per test claim, copay for 30 days supply is $4  Patient can fill through [facility]: [phone]   Authorization # [id number] Phone # [phone]

## 2024-04-14 NOTE — Progress Notes (Signed)
 Disenrolled from specialty program - pirfenidone  switched to low-dose Ofev. Ofev to be filled at [facility].

## 2024-04-19 ENCOUNTER — Ambulatory Visit: Attending: Cardiology | Admitting: Cardiology

## 2024-04-19 VITALS — BP 90/61 | HR 103 | Ht 61.0 in | Wt 118.6 lb

## 2024-04-19 DIAGNOSIS — R079 Chest pain, unspecified: Secondary | ICD-10-CM | POA: Diagnosis not present

## 2024-04-19 DIAGNOSIS — R0600 Dyspnea, unspecified: Secondary | ICD-10-CM | POA: Insufficient documentation

## 2024-04-19 DIAGNOSIS — I2510 Atherosclerotic heart disease of native coronary artery without angina pectoris: Secondary | ICD-10-CM | POA: Insufficient documentation

## 2024-04-19 NOTE — H&P (View-Only) (Signed)
 Cardiology Office Note:    Date:  04/19/2024   ID:  Joy Ball, DOB [birth year], MRN [id number]  PCP:  Edman Marsa PARAS, DO   [facility] Providers Cardiologist:  None     Referring MD: Edman Marsa *   No chief complaint on file.   History of Present Illness:    Joy Ball is a 58 y.o. female with a hx of CAD (LAD calcification on chest CT), former smoking x 40+ years, COPD, ILD on 2 L home oxygen , right upper lobe adenocarcinoma of the lung s/p radiation(not a candidate for surgery), presenting for follow-up.  Previously seen due to coronary calcifications and shortness of breath.  Echocardiogram and Lexiscan  Myoview  was obtained to evaluate cardiac etiology.  Echo 02/2024 EF 60 to 65%. Lexiscan  Myoview  02/2024 no significant ischemia.  Right heart cath recommended by pulmonary medicine to evaluate PAH.  No new concerns at this time.  Still with shortness of breath which is chronic.  No new concerns at this time.   Past Medical History:  Diagnosis Date   Allergy Idk   Arthritis Idk   Asthma    Cancer (HCC)    Cataract Idk   COPD (chronic obstructive pulmonary disease) (HCC)    Dyspnea    Emphysema lung (HCC)    Emphysema of lung (HCC) Idk   GERD (gastroesophageal reflux disease)    Lung cancer (HCC) 2024   Lung nodule    Migraines    Pulmonary fibrosis (HCC)    Seasonal allergies     Past Surgical History:  Procedure Laterality Date   CESAREAN SECTION     EYE SURGERY Bilateral 01/2023   TUBAL LIGATION      Current Medications: Current Meds  Medication Sig   acetaminophen  (TYLENOL ) 500 MG tablet Take 1,500 mg by mouth 3 (three) times daily. 2 to 3 times a day.   aspirin  EC 81 MG tablet Take 1 tablet (81 mg total) by mouth daily. Swallow whole.   budesonide -formoterol  (SYMBICORT ) 160-4.5 MCG/ACT inhaler Inhale 2 puffs into the lungs daily.   buPROPion  (WELLBUTRIN  SR) 150 MG 12 hr tablet TAKE 1 TABLET(150 MG) BY MOUTH TWICE  DAILY   fluticasone  (FLONASE ) 50 MCG/ACT nasal spray Place 2 sprays into both nostrils daily.   gabapentin  (NEURONTIN ) 300 MG capsule TAKE 1 CAPSULE(300 MG) BY MOUTH AT BEDTIME   ipratropium (ATROVENT ) 0.06 % nasal spray Place 2 sprays into both nostrils 4 (four) times daily.   ipratropium-albuterol  (DUONEB) 0.5-2.5 (3) MG/3ML SOLN Take 3 mLs by nebulization 2 (two) times daily.   loratadine  (CLARITIN ) 10 MG tablet Take 10 mg by mouth daily.   naproxen  (NAPROSYN ) 500 MG tablet TAKE 1 TABLET(500 MG) BY MOUTH TWICE DAILY WITH A MEAL   Nintedanib (OFEV) 100 MG CAPS Take 1 capsule (100 mg total) by mouth 2 (two) times daily.   nystatin  (MYCOSTATIN ) 100000 UNIT/ML suspension SHAKE LIQUID AND TAKE 5 ML BY MOUTH FOUR TIMES DAILY   omeprazole  (PRILOSEC) 40 MG capsule Take 1 capsule (40 mg total) by mouth in the morning and at bedtime.   ondansetron  (ZOFRAN ) 4 MG tablet Take 1 tablet (4 mg total) by mouth every 8 (eight) hours as needed for nausea or vomiting. Take this 30 minutes before each pirfenidone  dose   ondansetron  (ZOFRAN -ODT) 4 MG disintegrating tablet Take 1 tablet (4 mg total) by mouth every 8 (eight) hours as needed for nausea or vomiting.   Tiotropium Bromide  Monohydrate (SPIRIVA  RESPIMAT) 2.5 MCG/ACT AERS Inhale 2  puffs into the lungs daily.   VENTOLIN  HFA 108 (90 Base) MCG/ACT inhaler INHALE 2 PUFFS INTO THE LUNGS EVERY 6 HOURS AS NEEDED FOR WHEEZING OR SHORTNESS OF BREATH     Allergies:   Amoxicillin   Social History   Socioeconomic History   Marital status: Widowed    Spouse name: Not on file   Number of children: 3   Years of education: Not on file   Highest education level: Some college, no degree  Occupational History   Occupation: Lexicographer houses    Employer: OTHER  Tobacco Use   Smoking status: Former    Current packs/day: 0.00    Average packs/day: 1.5 packs/day for 81.7 years (121.3 ttl pk-yrs)    Types: Cigarettes    Start date: 83    Quit date: 02/22/2023    Years  since quitting: 1.1   Smokeless tobacco: Current   Tobacco comments:    Stopped in September of 2024 .hfb RN  Substance and Sexual Activity   Alcohol use: Yes    Alcohol/week: 0.0 standard drinks of alcohol    Comment: occ   Drug use: No   Sexual activity: Not on file  Other Topics Concern   Not on file  Social History Narrative   Not on file   Social Drivers of Health   Financial Resource Strain: Not on file  Food Insecurity: Food Insecurity Present (12/25/2023)   Hunger Vital Sign    Worried About Running Out of Food in the Last Year: Sometimes true    Ran Out of Food in the Last Year: Sometimes true  Transportation Needs: No Transportation Needs (12/25/2023)   PRAPARE - Administrator, Civil Service (Medical): No    Lack of Transportation (Non-Medical): No  Physical Activity: Not on file  Stress: Not on file  Social Connections: Unknown (12/22/2022)   Received from [facility]   Social Network    Social Network: Not on file     Family History: The patient's family history includes Arthritis in her mother; Asthma in her mother; COPD in her mother; Cancer in her father; Emphysema in her mother; Heart disease in her maternal grandfather; Lung cancer in her father; Miscarriages / Stillbirths in her sister and sister; Varicose Veins in her mother; Vision loss in her mother.  ROS:   Please see the history of present illness.     All other systems reviewed and are negative.  EKGs/Labs/Other Studies Reviewed:    The following studies were reviewed today:       Recent Labs: 12/25/2023: Magnesium 2.0 12/30/2023: Hemoglobin 13.3; Platelets 364 03/07/2024: BUN 11; Creatinine, Ser 0.64; Potassium 3.9; Sodium 135 04/07/2024: ALT 10  Recent Lipid Panel    Component Value Date/Time   CHOL 274 (H) 11/20/2022 0933   TRIG 117 11/20/2022 0933   HDL 191 11/20/2022 0933   CHOLHDL 1.4 11/20/2022 0933   LDLCALC 63 11/20/2022 0933     Risk Assessment/Calculations:              Physical Exam:    VS:  BP 90/61 (BP Location: Left Arm, Patient Position: Sitting)   Pulse (!) 103   Ht 5' 1 (1.549 m)   Wt 118 lb 9.6 oz (53.8 kg)   SpO2 97%   BMI 22.41 kg/m     Wt Readings from Last 3 Encounters:  04/19/24 118 lb 9.6 oz (53.8 kg)  04/07/24 119 lb (54 kg)  03/22/24 117 lb 9.6 oz (53.3 kg)  GEN:  Well nourished, well developed in no acute distress HEENT: Normal NECK: No JVD; No carotid bruits CARDIAC: RRR, no murmurs, rubs, gallops RESPIRATORY: Bilateral rhonchi, crackles noted ABDOMEN: Soft, non-tender, non-distended MUSCULOSKELETAL:  No edema; No deformity  SKIN: Warm and dry NEUROLOGIC:  Alert and oriented x 3 PSYCHIATRIC:  Normal affect   ASSESSMENT:    1. Chest pain of uncertain etiology   2. Coronary artery disease involving native heart without angina pectoris, unspecified vessel or lesion type   3. Dyspnea, unspecified type     PLAN:    In order of problems listed above:  Chest pain, history of smoking.  Echo 9/25 EF 60 to 65%.  Normal RVSP 19 mmHg.  Lexiscan  Myoview  9/25 no significant ischemia, low risk study. LAD calcifications, echo with normal EF, Lexiscan  Myoview  no ischemia.  Continue aspirin  81 mg daily, LDL at goal. Shortness of breath, on home oxygen , COPD, ILD likely etiology.  Normal LVEF 60 to 65%.  Schedule right heart cath to eval PH as per pulmonary recommendations.  RVSP on echo 18.8 mmHg.  Follow-up in 1 year or as needed.  Informed Consent   Shared Decision Making/Informed Consent The risks, including but not limited to, [bleeding or vascular complications (1 in 500), pneumothorax (1 in 1600), arrhythmia (1 in 1000) and death (1 in 5000)], benefits (diagnostic support and/or management of heart failure, pulmonary hypertension) and alternatives of a right heart catheterization were discussed in detail with Joy Ball and she is willing to proceed.         Medication Adjustments/Labs and Tests Ordered: Current  medicines are reviewed at length with the patient today.  Concerns regarding medicines are outlined above.  Orders Placed This Encounter  Procedures   CBC   Basic metabolic panel with GFR   No orders of the defined types were placed in this encounter.   Patient Instructions  Medication Instructions:  Your physician recommends that you continue on your current medications as directed. Please refer to the Current Medication list given to you today.   *If you need a refill on your cardiac medications before your next appointment, please call your pharmacy*  Lab Work: Your provider would like for you to have following labs drawn today BMP, CBC.   If you have labs (blood work) drawn today and your tests are completely normal, you will receive your results only by: MyChart Message (if you have MyChart) OR A paper copy in the mail If you have any lab test that is abnormal or we need to change your treatment, we will call you to review the results.  Testing/Procedures:  [facility] DEPT OF [place]. [facility] [address] [place], [address] [place] [address] Dept: [phone] Loc: [phone]  JERALD VILLALONA  04/19/2024  You are scheduled for a Cardiac Catheterization on Tuesday, November 4 with Dr. Lonni End.  1. Please arrive at the [facility]ascular Center Entrance of [facility], [address], [place] [address] at 8:30 AM (This is 0730 hour(s) prior to your procedure time).  Proceed to the Check-In Desk directly inside the entrance.  Procedure Parking: Use the entrance off of the [address] side of the hospital. Turn right upon entering and follow the driveway to parking that is directly in front of the Heart & Vascular Center. There is no valet parking available at this entrance, however there is an awning directly in front of the Heart &  Vascular Center for drop off/ pick up for patients.  Special  note: Every effort is made to have your procedure done on time. Please understand that emergencies sometimes delay scheduled procedures.  2. Diet: Nothing to eat after midnight.   3. Hydration: You need to be well hydrated before your procedure. On November 4, you may drink approved liquids (see below) until 2 hours before the procedure, with 16 oz of water as your last intake.   List of approved liquids water, clear juice, clear tea, black coffee, fruit juices, non-citric and without pulp, carbonated beverages, Gatorade, Kool -Aid, plain Jello-O and plain ice popsicles.  5. Medication instructions in preparation for your procedure:   Contrast Allergy: No   On the morning of your procedure, take your Aspirin  81 mg and any morning medicines NOT listed above.  You may use sips of water.  6. Plan to go home the same day, you will only stay overnight if medically necessary. 7. Bring a current list of your medications and current insurance cards. 8. You MUST have a responsible person to drive you home. 9. Someone MUST be with you the first 24 hours after you arrive home or your discharge will be delayed. 10. Please wear clothes that are easy to get on and off and wear slip-on shoes.  Thank you for allowing us  to care for you!   -- [facility]   Follow-Up: At [facility], you and your health needs are our priority.  As part of our continuing mission to provide you with exceptional heart care, our providers are all part of one team.  This team includes your primary Cardiologist (physician) and Advanced Practice Providers or APPs (Physician Assistants and Nurse Practitioners) who all work together to provide you with the care you need, when you need it.  Your next appointment:   1 year(s)  Provider:   You may see Dr Darliss or one of the following Advanced Practice Providers on your designated Care Team:   Lonni Meager, NP Lesley Maffucci,  PA-C Bernardino Bring, PA-C Cadence Haugan, PA-C Tylene Lunch, NP Barnie Hila, NP    We recommend signing up for the patient portal called MyChart.  Sign up information is provided on this After Visit Summary.  MyChart is used to connect with patients for Virtual Visits (Telemedicine).  Patients are able to view lab/test results, encounter notes, upcoming appointments, etc.  Non-urgent messages can be sent to your provider as well.   To learn more about what you can do with MyChart, go to [email or web address].             Signed, Redell Darliss, MD  04/19/2024 9:42 AM    [facility]

## 2024-04-19 NOTE — Progress Notes (Signed)
 Cardiology Office Note:    Date:  04/19/2024   ID:  Joy Ball, DOB [birth year], MRN [id number]  PCP:  Edman Marsa PARAS, DO   [facility] Providers Cardiologist:  None     Referring MD: Edman Marsa *   No chief complaint on file.   History of Present Illness:    Joy Ball is a 58 y.o. female with a hx of CAD (LAD calcification on chest CT), former smoking x 40+ years, COPD, ILD on 2 L home oxygen , right upper lobe adenocarcinoma of the lung s/p radiation(not a candidate for surgery), presenting for follow-up.  Previously seen due to coronary calcifications and shortness of breath.  Echocardiogram and Lexiscan  Myoview  was obtained to evaluate cardiac etiology.  Echo 02/2024 EF 60 to 65%. Lexiscan  Myoview  02/2024 no significant ischemia.  Right heart cath recommended by pulmonary medicine to evaluate PAH.  No new concerns at this time.  Still with shortness of breath which is chronic.  No new concerns at this time.   Past Medical History:  Diagnosis Date   Allergy Idk   Arthritis Idk   Asthma    Cancer (HCC)    Cataract Idk   COPD (chronic obstructive pulmonary disease) (HCC)    Dyspnea    Emphysema lung (HCC)    Emphysema of lung (HCC) Idk   GERD (gastroesophageal reflux disease)    Lung cancer (HCC) 2024   Lung nodule    Migraines    Pulmonary fibrosis (HCC)    Seasonal allergies     Past Surgical History:  Procedure Laterality Date   CESAREAN SECTION     EYE SURGERY Bilateral 01/2023   TUBAL LIGATION      Current Medications: Current Meds  Medication Sig   acetaminophen  (TYLENOL ) 500 MG tablet Take 1,500 mg by mouth 3 (three) times daily. 2 to 3 times a day.   aspirin  EC 81 MG tablet Take 1 tablet (81 mg total) by mouth daily. Swallow whole.   budesonide -formoterol  (SYMBICORT ) 160-4.5 MCG/ACT inhaler Inhale 2 puffs into the lungs daily.   buPROPion  (WELLBUTRIN  SR) 150 MG 12 hr tablet TAKE 1 TABLET(150 MG) BY MOUTH TWICE  DAILY   fluticasone  (FLONASE ) 50 MCG/ACT nasal spray Place 2 sprays into both nostrils daily.   gabapentin  (NEURONTIN ) 300 MG capsule TAKE 1 CAPSULE(300 MG) BY MOUTH AT BEDTIME   ipratropium (ATROVENT ) 0.06 % nasal spray Place 2 sprays into both nostrils 4 (four) times daily.   ipratropium-albuterol  (DUONEB) 0.5-2.5 (3) MG/3ML SOLN Take 3 mLs by nebulization 2 (two) times daily.   loratadine  (CLARITIN ) 10 MG tablet Take 10 mg by mouth daily.   naproxen  (NAPROSYN ) 500 MG tablet TAKE 1 TABLET(500 MG) BY MOUTH TWICE DAILY WITH A MEAL   Nintedanib (OFEV) 100 MG CAPS Take 1 capsule (100 mg total) by mouth 2 (two) times daily.   nystatin  (MYCOSTATIN ) 100000 UNIT/ML suspension SHAKE LIQUID AND TAKE 5 ML BY MOUTH FOUR TIMES DAILY   omeprazole  (PRILOSEC) 40 MG capsule Take 1 capsule (40 mg total) by mouth in the morning and at bedtime.   ondansetron  (ZOFRAN ) 4 MG tablet Take 1 tablet (4 mg total) by mouth every 8 (eight) hours as needed for nausea or vomiting. Take this 30 minutes before each pirfenidone  dose   ondansetron  (ZOFRAN -ODT) 4 MG disintegrating tablet Take 1 tablet (4 mg total) by mouth every 8 (eight) hours as needed for nausea or vomiting.   Tiotropium Bromide  Monohydrate (SPIRIVA  RESPIMAT) 2.5 MCG/ACT AERS Inhale 2  puffs into the lungs daily.   VENTOLIN  HFA 108 (90 Base) MCG/ACT inhaler INHALE 2 PUFFS INTO THE LUNGS EVERY 6 HOURS AS NEEDED FOR WHEEZING OR SHORTNESS OF BREATH     Allergies:   Amoxicillin   Social History   Socioeconomic History   Marital status: Widowed    Spouse name: Not on file   Number of children: 3   Years of education: Not on file   Highest education level: Some college, no degree  Occupational History   Occupation: Lexicographer houses    Employer: OTHER  Tobacco Use   Smoking status: Former    Current packs/day: 0.00    Average packs/day: 1.5 packs/day for 81.7 years (121.3 ttl pk-yrs)    Types: Cigarettes    Start date: 83    Quit date: 02/22/2023    Years  since quitting: 1.1   Smokeless tobacco: Current   Tobacco comments:    Stopped in September of 2024 .hfb RN  Substance and Sexual Activity   Alcohol use: Yes    Alcohol/week: 0.0 standard drinks of alcohol    Comment: occ   Drug use: No   Sexual activity: Not on file  Other Topics Concern   Not on file  Social History Narrative   Not on file   Social Drivers of Health   Financial Resource Strain: Not on file  Food Insecurity: Food Insecurity Present (12/25/2023)   Hunger Vital Sign    Worried About Running Out of Food in the Last Year: Sometimes true    Ran Out of Food in the Last Year: Sometimes true  Transportation Needs: No Transportation Needs (12/25/2023)   PRAPARE - Administrator, Civil Service (Medical): No    Lack of Transportation (Non-Medical): No  Physical Activity: Not on file  Stress: Not on file  Social Connections: Unknown (12/22/2022)   Received from [facility]   Social Network    Social Network: Not on file     Family History: The patient's family history includes Arthritis in her mother; Asthma in her mother; COPD in her mother; Cancer in her father; Emphysema in her mother; Heart disease in her maternal grandfather; Lung cancer in her father; Miscarriages / Stillbirths in her sister and sister; Varicose Veins in her mother; Vision loss in her mother.  ROS:   Please see the history of present illness.     All other systems reviewed and are negative.  EKGs/Labs/Other Studies Reviewed:    The following studies were reviewed today:       Recent Labs: 12/25/2023: Magnesium 2.0 12/30/2023: Hemoglobin 13.3; Platelets 364 03/07/2024: BUN 11; Creatinine, Ser 0.64; Potassium 3.9; Sodium 135 04/07/2024: ALT 10  Recent Lipid Panel    Component Value Date/Time   CHOL 274 (H) 11/20/2022 0933   TRIG 117 11/20/2022 0933   HDL 191 11/20/2022 0933   CHOLHDL 1.4 11/20/2022 0933   LDLCALC 63 11/20/2022 0933     Risk Assessment/Calculations:              Physical Exam:    VS:  BP 90/61 (BP Location: Left Arm, Patient Position: Sitting)   Pulse (!) 103   Ht 5' 1 (1.549 m)   Wt 118 lb 9.6 oz (53.8 kg)   SpO2 97%   BMI 22.41 kg/m     Wt Readings from Last 3 Encounters:  04/19/24 118 lb 9.6 oz (53.8 kg)  04/07/24 119 lb (54 kg)  03/22/24 117 lb 9.6 oz (53.3 kg)  GEN:  Well nourished, well developed in no acute distress HEENT: Normal NECK: No JVD; No carotid bruits CARDIAC: RRR, no murmurs, rubs, gallops RESPIRATORY: Bilateral rhonchi, crackles noted ABDOMEN: Soft, non-tender, non-distended MUSCULOSKELETAL:  No edema; No deformity  SKIN: Warm and dry NEUROLOGIC:  Alert and oriented x 3 PSYCHIATRIC:  Normal affect   ASSESSMENT:    1. Chest pain of uncertain etiology   2. Coronary artery disease involving native heart without angina pectoris, unspecified vessel or lesion type   3. Dyspnea, unspecified type     PLAN:    In order of problems listed above:  Chest pain, history of smoking.  Echo 9/25 EF 60 to 65%.  Normal RVSP 19 mmHg.  Lexiscan  Myoview  9/25 no significant ischemia, low risk study. LAD calcifications, echo with normal EF, Lexiscan  Myoview  no ischemia.  Continue aspirin  81 mg daily, LDL at goal. Shortness of breath, on home oxygen , COPD, ILD likely etiology.  Normal LVEF 60 to 65%.  Schedule right heart cath to eval PH as per pulmonary recommendations.  RVSP on echo 18.8 mmHg.  Follow-up in 1 year or as needed.  Informed Consent   Shared Decision Making/Informed Consent The risks, including but not limited to, [bleeding or vascular complications (1 in 500), pneumothorax (1 in 1600), arrhythmia (1 in 1000) and death (1 in 5000)], benefits (diagnostic support and/or management of heart failure, pulmonary hypertension) and alternatives of a right heart catheterization were discussed in detail with Ms. Babson and she is willing to proceed.         Medication Adjustments/Labs and Tests Ordered: Current  medicines are reviewed at length with the patient today.  Concerns regarding medicines are outlined above.  Orders Placed This Encounter  Procedures   CBC   Basic metabolic panel with GFR   No orders of the defined types were placed in this encounter.   Patient Instructions  Medication Instructions:  Your physician recommends that you continue on your current medications as directed. Please refer to the Current Medication list given to you today.   *If you need a refill on your cardiac medications before your next appointment, please call your pharmacy*  Lab Work: Your provider would like for you to have following labs drawn today BMP, CBC.   If you have labs (blood work) drawn today and your tests are completely normal, you will receive your results only by: MyChart Message (if you have MyChart) OR A paper copy in the mail If you have any lab test that is abnormal or we need to change your treatment, we will call you to review the results.  Testing/Procedures:  [facility] OF [place] [facility] [address] [place], [address] [place] [address] Dept: [phone] Loc: [phone]  JERALD VILLALONA  04/19/2024  You are scheduled for a Cardiac Catheterization on Tuesday, November 4 with Dr. Lonni End.  1. Please arrive at the [facility]ascular Center Entrance of [facility], [address], [place] [address] at 8:30 AM (This is 0730 hour(s) prior to your procedure time).  Proceed to the Check-In Desk directly inside the entrance.  Procedure Parking: Use the entrance off of the [address] side of the hospital. Turn right upon entering and follow the driveway to parking that is directly in front of the Heart & Vascular Center. There is no valet parking available at this entrance, however there is an awning directly in front of the Heart &  Vascular Center for drop off/ pick up for patients.  Special  note: Every effort is made to have your procedure done on time. Please understand that emergencies sometimes delay scheduled procedures.  2. Diet: Nothing to eat after midnight.   3. Hydration: You need to be well hydrated before your procedure. On November 4, you may drink approved liquids (see below) until 2 hours before the procedure, with 16 oz of water as your last intake.   List of approved liquids water, clear juice, clear tea, black coffee, fruit juices, non-citric and without pulp, carbonated beverages, Gatorade, Kool -Aid, plain Jello-O and plain ice popsicles.  5. Medication instructions in preparation for your procedure:   Contrast Allergy: No   On the morning of your procedure, take your Aspirin  81 mg and any morning medicines NOT listed above.  You may use sips of water.  6. Plan to go home the same day, you will only stay overnight if medically necessary. 7. Bring a current list of your medications and current insurance cards. 8. You MUST have a responsible person to drive you home. 9. Someone MUST be with you the first 24 hours after you arrive home or your discharge will be delayed. 10. Please wear clothes that are easy to get on and off and wear slip-on shoes.  Thank you for allowing us  to care for you!   -- [facility] services   Follow-Up: At [facility], you and your health needs are our priority.  As part of our continuing mission to provide you with exceptional heart care, our providers are all part of one team.  This team includes your primary Cardiologist (physician) and Advanced Practice Providers or APPs (Physician Assistants and Nurse Practitioners) who all work together to provide you with the care you need, when you need it.  Your next appointment:   1 year(s)  Provider:   You may see Dr Darliss or one of the following Advanced Practice Providers on your designated Care Team:   Lonni Meager, NP Lesley Maffucci,  PA-C Bernardino Bring, PA-C Cadence Haugan, PA-C Tylene Lunch, NP Barnie Hila, NP    We recommend signing up for the patient portal called MyChart.  Sign up information is provided on this After Visit Summary.  MyChart is used to connect with patients for Virtual Visits (Telemedicine).  Patients are able to view lab/test results, encounter notes, upcoming appointments, etc.  Non-urgent messages can be sent to your provider as well.   To learn more about what you can do with MyChart, go to [email or web address].             Signed, Redell Darliss, MD  04/19/2024 9:42 AM    [facility]

## 2024-04-19 NOTE — Patient Instructions (Signed)
 Medication Instructions:  Your physician recommends that you continue on your current medications as directed. Please refer to the Current Medication list given to you today.   *If you need a refill on your cardiac medications before your next appointment, please call your pharmacy*  Lab Work: Your provider would like for you to have following labs drawn today BMP, CBC.   If you have labs (blood work) drawn today and your tests are completely normal, you will receive your results only by: MyChart Message (if you have MyChart) OR A paper copy in the mail If you have any lab test that is abnormal or we need to change your treatment, we will call you to review the results.  Testing/Procedures:  [facility] [address] [place], [address] [place] [address] Dept: [phone] Loc: [phone]  EMALINA DUBREUIL  04/19/2024  You are scheduled for a Cardiac Catheterization on Tuesday, November 4 with Dr. Lonni End.  1. Please arrive at the Heart [facility] Vascular Center Entrance of [facility], [address], [place] [address] at 8:30 AM (This is 0730 hour(s) prior to your procedure time).  Proceed to the Check-In Desk directly inside the entrance.  Procedure Parking: Use the entrance off of the [address] side of the hospital. Turn right upon entering and follow the driveway to parking that is directly in front of the Heart & Vascular Center. There is no valet parking available at this entrance, however there is an awning directly in front of the Heart & Vascular Center for drop off/ pick up for patients.  Special note: Every effort is made to have your procedure done on time. Please understand that emergencies sometimes delay scheduled procedures.  2. Diet: Nothing to eat after midnight.   3. Hydration: You need to be well hydrated before your procedure. On November 4, you may drink approved  liquids (see below) until 2 hours before the procedure, with 16 oz of water as your last intake.   List of approved liquids water, clear juice, clear tea, black coffee, fruit juices, non-citric and without pulp, carbonated beverages, Gatorade, Kool -Aid, plain Jello-O and plain ice popsicles.  5. Medication instructions in preparation for your procedure:   Contrast Allergy: No   On the morning of your procedure, take your Aspirin  81 mg and any morning medicines NOT listed above.  You may use sips of water.  6. Plan to go home the same day, you will only stay overnight if medically necessary. 7. Bring a current list of your medications and current insurance cards. 8. You MUST have a responsible person to drive you home. 9. Someone MUST be with you the first 24 hours after you arrive home or your discharge will be delayed. 10. Please wear clothes that are easy to get on and off and wear slip-on shoes.  Thank you for allowing us  to care for you!   -- [facility] Invasive Cardiovascular services   Follow-Up: At [facility], you and your health needs are our priority.  As part of our continuing mission to provide you with exceptional heart care, our providers are all part of one team.  This team includes your primary Cardiologist (physician) and Advanced Practice Providers or APPs (Physician Assistants and Nurse Practitioners) who all work together to provide you with the care you need, when you need it.  Your next appointment:   1 year(s)  Provider:  You may see Dr Darliss or one of the following Advanced Practice Providers on your designated Care Team:   Lonni Meager, NP Lesley Maffucci, PA-C Bernardino Bring, PA-C Cadence [place], PA-C Tylene Lunch, NP Barnie Hila, NP    We recommend signing up for the patient portal called MyChart.  Sign up information is provided on this After Visit Summary.  MyChart is used to connect with patients for Virtual Visits  (Telemedicine).  Patients are able to view lab/test results, encounter notes, upcoming appointments, etc.  Non-urgent messages can be sent to your provider as well.   To learn more about what you can do with MyChart, go to [email or web address].

## 2024-04-20 LAB — BASIC METABOLIC PANEL WITH GFR
BUN/Creatinine Ratio: 13 (ref 9–23)
BUN: 10 mg/dL (ref 6–24)
CO2: 17 mmol/L — ABNORMAL LOW (ref 20–29)
Calcium: 8.8 mg/dL (ref 8.7–10.2)
Chloride: 104 mmol/L (ref 96–106)
Creatinine, Ser: 0.76 mg/dL (ref 0.57–1.00)
Glucose: 95 mg/dL (ref 70–99)
Potassium: 3.9 mmol/L (ref 3.5–5.2)
Sodium: 139 mmol/L (ref 134–144)
eGFR: 91 mL/min/1.73 (ref >59)

## 2024-04-20 LAB — CBC
Hematocrit: 33.9 % — ABNORMAL LOW (ref 34.0–46.6)
Hemoglobin: 11.7 g/dL (ref 11.1–15.9)
MCH: 35.2 pg — ABNORMAL HIGH (ref 26.6–33.0)
MCHC: 34.5 g/dL (ref 31.5–35.7)
MCV: 102 fL — ABNORMAL HIGH (ref 79–97)
Platelets: 426 x10E3/uL (ref 150–450)
RBC: 3.32 x10E6/uL — ABNORMAL LOW (ref 3.77–5.28)
RDW: 12.5 % (ref 11.7–15.4)
WBC: 9.5 x10E3/uL (ref 3.4–10.8)

## 2024-04-21 ENCOUNTER — Encounter: Payer: Self-pay | Admitting: Cardiology

## 2024-04-23 DIAGNOSIS — R0602 Shortness of breath: Secondary | ICD-10-CM | POA: Diagnosis not present

## 2024-04-26 ENCOUNTER — Ambulatory Visit
Admission: RE | Admit: 2024-04-26 | Discharge: 2024-04-26 | Disposition: A | Discharge status: Home / Self Care | Attending: Internal Medicine | Admitting: Internal Medicine

## 2024-04-26 ENCOUNTER — Encounter
Admission: RE | Disposition: A | Discharge status: Home / Self Care | Payer: Self-pay | Source: Home / Self Care | Attending: Internal Medicine

## 2024-04-26 ENCOUNTER — Encounter: Payer: Self-pay | Admitting: Internal Medicine

## 2024-04-26 DIAGNOSIS — Z7982 Long term (current) use of aspirin: Secondary | ICD-10-CM | POA: Diagnosis not present

## 2024-04-26 DIAGNOSIS — Z9981 Dependence on supplemental oxygen: Secondary | ICD-10-CM | POA: Insufficient documentation

## 2024-04-26 DIAGNOSIS — Z923 Personal history of irradiation: Secondary | ICD-10-CM | POA: Diagnosis not present

## 2024-04-26 DIAGNOSIS — R0600 Dyspnea, unspecified: Secondary | ICD-10-CM

## 2024-04-26 DIAGNOSIS — R0602 Shortness of breath: Secondary | ICD-10-CM | POA: Diagnosis not present

## 2024-04-26 DIAGNOSIS — J4489 Other specified chronic obstructive pulmonary disease: Secondary | ICD-10-CM | POA: Insufficient documentation

## 2024-04-26 DIAGNOSIS — Z85118 Personal history of other malignant neoplasm of bronchus and lung: Secondary | ICD-10-CM | POA: Insufficient documentation

## 2024-04-26 DIAGNOSIS — R079 Chest pain, unspecified: Secondary | ICD-10-CM | POA: Insufficient documentation

## 2024-04-26 DIAGNOSIS — J8410 Pulmonary fibrosis, unspecified: Secondary | ICD-10-CM | POA: Insufficient documentation

## 2024-04-26 DIAGNOSIS — I272 Pulmonary hypertension, unspecified: Secondary | ICD-10-CM

## 2024-04-26 DIAGNOSIS — I2510 Atherosclerotic heart disease of native coronary artery without angina pectoris: Secondary | ICD-10-CM | POA: Diagnosis not present

## 2024-04-26 DIAGNOSIS — Z87891 Personal history of nicotine dependence: Secondary | ICD-10-CM | POA: Diagnosis not present

## 2024-04-26 HISTORY — PX: RIGHT HEART CATH: CATH118263

## 2024-04-26 LAB — POCT I-STAT EG7
Acid-base deficit: 2 mmol/L (ref 0.0–2.0)
Bicarbonate: 23.5 mmol/L (ref 20.0–28.0)
Calcium, Ion: 1.24 mmol/L (ref 1.15–1.40)
HCT: 34 % — ABNORMAL LOW (ref 36.0–46.0)
Hemoglobin: 11.6 g/dL — ABNORMAL LOW (ref 12.0–15.0)
O2 Saturation: 64 %
Potassium: 3.7 mmol/L (ref 3.5–5.1)
Sodium: 140 mmol/L (ref 135–145)
TCO2: 25 mmol/L (ref 22–32)
pCO2, Ven: 40.6 mmHg — ABNORMAL LOW (ref 44–60)
pH, Ven: 7.371 (ref 7.25–7.43)
pO2, Ven: 34 mmHg (ref 32–45)

## 2024-04-26 SURGERY — RIGHT HEART CATH
Anesthesia: Moderate Sedation | Laterality: Right

## 2024-04-26 MED ORDER — ONDANSETRON HCL 4 MG/2ML IJ SOLN: 4.0000 mg | Freq: Four times a day (QID) | INTRAMUSCULAR | Status: DC | PRN

## 2024-04-26 MED ORDER — LIDOCAINE HCL (PF) 1 % IJ SOLN
INTRAMUSCULAR | Status: DC | PRN
  Administered 2024-04-26: 2 mL

## 2024-04-26 MED ORDER — HEPARIN (PORCINE) IN NACL 1000-0.9 UT/500ML-% IV SOLN
INTRAVENOUS | Status: AC
  Filled 2024-04-26: qty 1000

## 2024-04-26 MED ORDER — FREE WATER: 500.0000 mL | Freq: Once | Status: DC

## 2024-04-26 MED ORDER — ACETAMINOPHEN 325 MG PO TABS: 650.0000 mg | ORAL_TABLET | ORAL | Status: DC | PRN

## 2024-04-26 MED ORDER — HYDRALAZINE HCL 20 MG/ML IJ SOLN
10.0000 mg | INTRAMUSCULAR | Status: DC | PRN
Start: 2024-04-26 — End: 2024-04-26

## 2024-04-26 MED ORDER — LABETALOL HCL 5 MG/ML IV SOLN: 10.0000 mg | INTRAVENOUS | Status: DC | PRN

## 2024-04-26 MED ORDER — SODIUM CHLORIDE 0.9% FLUSH: 3.0000 mL | INTRAVENOUS | Status: DC | PRN

## 2024-04-26 MED ORDER — MIDAZOLAM HCL 2 MG/2ML IJ SOLN
INTRAMUSCULAR | Status: AC
  Filled 2024-04-26: qty 2

## 2024-04-26 MED ORDER — HEPARIN (PORCINE) IN NACL 1000-0.9 UT/500ML-% IV SOLN
INTRAVENOUS | Status: DC | PRN
  Administered 2024-04-26: 1000 mL

## 2024-04-26 MED ORDER — SODIUM CHLORIDE 0.9% FLUSH: 3.0000 mL | Freq: Two times a day (BID) | INTRAVENOUS | Status: DC

## 2024-04-26 MED ORDER — FENTANYL CITRATE (PF) 100 MCG/2ML IJ SOLN
INTRAMUSCULAR | Status: AC
  Filled 2024-04-26: qty 2

## 2024-04-26 MED ORDER — LIDOCAINE HCL 1 % IJ SOLN
INTRAMUSCULAR | Status: AC
  Filled 2024-04-26: qty 20

## 2024-04-26 MED ORDER — SODIUM CHLORIDE 0.9 % IV SOLN
250.0000 mL | INTRAVENOUS | Status: DC | PRN
Start: 2024-04-26 — End: 2024-04-26

## 2024-04-26 SURGICAL SUPPLY — 6 items
CATH BALLN WEDGE 5F 110CM (CATHETERS) IMPLANT
DRAPE BRACHIAL (DRAPES) IMPLANT
PACK CARDIAC CATH (CUSTOM PROCEDURE TRAY) ×1 IMPLANT
SET ATX-X65L (MISCELLANEOUS) IMPLANT
SHEATH GLIDE SLENDER 4/5FR (SHEATH) IMPLANT
STATION PROTECTION PRESSURIZED (MISCELLANEOUS) IMPLANT

## 2024-04-26 NOTE — Interval H&P Note (Signed)
 History and Physical Interval Note:  04/26/2024 9:23 AM  Joy Ball  has presented today for surgery, with the diagnosis of shortness of breath and pulmonary fibrosis with concern for pulmonary hypertension.  The various methods of treatment have been discussed with the patient and family. After consideration of risks, benefits and other options for treatment, the patient has consented to  Procedure(s): RIGHT HEART CATH (Right) as a surgical intervention.  The patient's history has been reviewed, patient examined, no change in status, stable for surgery.  I have reviewed the patient's chart and labs.  Questions were answered to the patient's satisfaction.     Arti Trang

## 2024-04-26 NOTE — Discharge Instructions (Signed)
Right Heart Cath, Care After This sheet gives you information about how to care for yourself after your procedure. Your health care provider may also give you more specific instructions. If you have problems or questions, contact your health care provider. What can I expect after the procedure? After the procedure, it is common to have: Bruising or mild discomfort in the area where the IV was inserted (insertion site). Follow these instructions at home: Eating and drinking  You may eat and drink after your procedure.  Drink a lot of fluids for the first several days after the procedure, as directed by your health care provider. This helps to wash (flush) the contrast out of your body. Examples of healthy fluids include water or low-calorie drinks. General instructions Check your IV insertion area and also your venous access site every day for signs of infection. Check for: Redness, swelling, or pain. Fluid or blood. Warmth. Pus or a bad smell. Take over-the-counter and prescription medicines only as told by your health care provider. Rest and return to your normal activities as told by your health care provider. Ask your health care provider what activities are safe for you. Do not drive for 24 hours if you were given a medicine to help you relax (sedative), or until your health care provider approves. Keep all follow-up visits as told by your health care provider. This is important. Contact a health care provider if: Your skin becomes itchy or you develop a rash or hives. You have a fever that does not get better with medicine. You feel nauseous. You vomit. You have redness, swelling, or pain around the insertion site. You have fluid or blood coming from the insertion site. Your insertion area feels warm to the touch. You have pus or a bad smell coming from the insertion site. Get help right away if: You have difficulty breathing or shortness of breath. You develop chest pain. You  faint. You feel very dizzy. These symptoms may represent a serious problem that is an emergency. Do not wait to see if the symptoms will go away. Get medical help right away. Call your local emergency services (911 in the [place] Do not drive yourself to the hospital. Summary After your procedure, it is common to have bruising or mild discomfort in the area where the IV was inserted. You should check your IV insertion area every day for signs of infection. Take over-the-counter and prescription medicines only as told by your health care provider. You should drink a lot of fluids for the first several days after the procedure to help flush the contrast from your body. This information is not intended to replace advice given to you by your health care provider. Make sure you discuss any questions you have with your health care provider. Document Released: 03/30/2013 Document Revised: 05/22/2017 Document Reviewed: 05/03/2016 Elsevier Patient Education  2020 Elsevier Inc. 

## 2024-05-02 ENCOUNTER — Ambulatory Visit: Payer: Self-pay | Admitting: Internal Medicine

## 2024-05-02 DIAGNOSIS — J84112 Idiopathic pulmonary fibrosis: Secondary | ICD-10-CM

## 2024-05-02 DIAGNOSIS — Z5181 Encounter for therapeutic drug level monitoring: Secondary | ICD-10-CM

## 2024-05-02 DIAGNOSIS — D7589 Other specified diseases of blood and blood-forming organs: Secondary | ICD-10-CM

## 2024-05-02 NOTE — Progress Notes (Signed)
 Bro[place]line pulmonary hypertension. No indication for treating the pulmnary hyperentions. Continue ofev protocol and  make sure you keep up with appts

## 2024-05-02 NOTE — Telephone Encounter (Signed)
**Note De-identified  [place] Obfuscation** Please advise 

## 2024-05-03 NOTE — Telephone Encounter (Signed)
 That was on th [facility] and newly low but barely low. It was not seen before in regular blood draws   Plan  Give this 1-2 weeks and recheck cbc - ordered. Please see if order is correct enough she can get it at [facility] (she lives in [place])

## 2024-05-08 ENCOUNTER — Ambulatory Visit: Payer: Self-pay | Admitting: Internal Medicine

## 2024-05-08 NOTE — Progress Notes (Signed)
 LFOts stable except for chronic elevation of alk phos No change in treatment plan

## 2024-05-10 ENCOUNTER — Other Ambulatory Visit: Payer: Self-pay | Admitting: Family Medicine

## 2024-05-10 DIAGNOSIS — Z720 Tobacco use: Secondary | ICD-10-CM

## 2024-05-11 ENCOUNTER — Inpatient Hospital Stay: Attending: Oncology | Admitting: Oncology

## 2024-05-11 ENCOUNTER — Encounter: Payer: Self-pay | Admitting: Oncology

## 2024-05-11 ENCOUNTER — Inpatient Hospital Stay

## 2024-05-11 VITALS — BP 105/70 | HR 91 | Temp 97.2°F | Resp 18 | Wt 113.5 lb

## 2024-05-11 DIAGNOSIS — C3431 Malignant neoplasm of lower lobe, right bronchus or lung: Secondary | ICD-10-CM | POA: Diagnosis not present

## 2024-05-11 DIAGNOSIS — C3491 Malignant neoplasm of unspecified part of right bronchus or lung: Secondary | ICD-10-CM | POA: Diagnosis not present

## 2024-05-11 DIAGNOSIS — Z87891 Personal history of nicotine dependence: Secondary | ICD-10-CM | POA: Diagnosis not present

## 2024-05-11 DIAGNOSIS — E538 Deficiency of other specified B group vitamins: Secondary | ICD-10-CM | POA: Insufficient documentation

## 2024-05-11 DIAGNOSIS — D7589 Other specified diseases of blood and blood-forming organs: Secondary | ICD-10-CM | POA: Diagnosis not present

## 2024-05-11 LAB — CBC (CANCER CENTER ONLY)
HCT: 38.7 % (ref 36.0–46.0)
Hemoglobin: 13 g/dL (ref 12.0–15.0)
MCH: 33.6 pg (ref 26.0–34.0)
MCHC: 33.6 g/dL (ref 30.0–36.0)
MCV: 100 fL (ref 80.0–100.0)
Platelet Count: 593 K/uL — ABNORMAL HIGH (ref 150–400)
RBC: 3.87 MIL/uL (ref 3.87–5.11)
RDW: 14.1 % (ref 11.5–15.5)
WBC Count: 8.6 K/uL (ref 4.0–10.5)
nRBC: 0 % (ref 0.0–0.2)

## 2024-05-11 LAB — BASIC METABOLIC PANEL - CANCER CENTER ONLY
Anion gap: 11 (ref 5–15)
BUN: 14 mg/dL (ref 6–20)
CO2: 21 mmol/L — ABNORMAL LOW (ref 22–32)
Calcium: 8.8 mg/dL — ABNORMAL LOW (ref 8.9–10.3)
Chloride: 105 mmol/L (ref 98–111)
Creatinine: 0.83 mg/dL (ref 0.44–1.00)
GFR, Estimated: >60 mL/min (ref >60)
Glucose, Bld: 112 mg/dL — ABNORMAL HIGH (ref 70–99)
Potassium: 3.3 mmol/L — ABNORMAL LOW (ref 3.5–5.1)
Sodium: 137 mmol/L (ref 135–145)

## 2024-05-11 NOTE — Assessment & Plan Note (Addendum)
 Stage IA right lung adenocarcinoma,  NGS showed KRAS, TP53, STAG2. TMB 7.9,TPS 60% CPS 65% not feasible for resection due to poor lung function.  April 2025 S/p SBRT to right upper lobe.  Sept 2025 CT chest findings were reviewed with patient - radiation changes.  Recommend surveillance Q6 months CT chest w contrast.

## 2024-05-11 NOTE — Progress Notes (Signed)
 Hematology/Oncology Consult Note [phone] [phone]     REFERRING PROVIDER: Edman Blunt *    CHIEF COMPLAINTS/PURPOSE OF CONSULTATION:  Stage I right lung adenocarcinoma.   ASSESSMENT & PLAN:   Cancer Staging  Adenocarcinoma of right lung (HCC) Staging form: Lung, AJCC V9 - Clinical: Stage IA2 (cT1b, cN0, cM0) - Signed by Babara Call, MD on 08/19/2023   Adenocarcinoma of right lung (HCC) Stage IA right lung adenocarcinoma,  NGS showed KRAS, TP53, STAG2. TMB 7.9,TPS 60% CPS 65% not feasible for resection due to poor lung function.  April 2025 S/p SBRT to right upper lobe.  Sept 2025 CT chest findings were reviewed with patient - radiation changes.  Recommend surveillance Q6 months CT chest w contrast.   Macrocytosis without anemia Chronic macrocytosis without anemia due to folate deficiency.  Resolved.   Folate deficiency Recommend folic acid  supplementation. Check Folate level at next visit.   Orders Placed This Encounter  Procedures   CMP (Cancer Center only)    Standing Status:   Future    Expected Date:   11/08/2024    Expiration Date:   02/06/2025   CBC with Differential (Cancer Center Only)    Standing Status:   Future    Expected Date:   11/08/2024    Expiration Date:   02/06/2025   Folate    Standing Status:   Future    Expected Date:   11/08/2024    Expiration Date:   02/06/2025   Follow-up 6 months.  All questions were answered. The patient knows to call the clinic with any problems, questions or concerns.  Call Babara, MD, PhD [facility] Hematology Oncology 05/11/2024    HISTORY OF PRESENTING ILLNESS:  Joy Ball 58 y.o. female presents to establish care for Stage I right lung adenocarcinoma.  I have reviewed her chart and materials related to her cancer extensively and collaborated history with the patient. Summary of oncologic history is as follows: Oncology History  Adenocarcinoma of right lung (HCC)  12/26/2022  Imaging   Patient had a CT chest high-resolution without contrast done at [facility] predominant pulmonary fibrosis, UIP pattern. Emphysema with biapical areas of scarring.    07/31/2023 Imaging   CT super D chest without contrast 1. The 1.9 cm right upper lobe nodule along the inferior margin of the of the right upper lobe scarring is hypermetabolic on today's PET-CT, concerning for primary lung malignancy, or less likely active granulomatous process. 2. Extensive centrilobular emphysema. 3. Fibrosis and mild honeycombing in the lung bases, UIP pattern. 4. Hepatic steatosis. 5. Aortic and coronary atherosclerosis.   Aortic Atherosclerosis (ICD10-I70.0) and Emphysema (ICD10-J43.9).   08/12/2023 Imaging   PET scan showed 1. The 1.8 by 1.5 cm nodular lesion along the inferior margin of the chronic biapical pleuroparenchymal scarring in the right upper lobe abutting the major fissure has a maximum SUV of 5.8, suspicious for either malignancy (favored) or active granulomatous process. 2. No findings of distant metastatic disease. 3. Diffuse hepatic steatosis. 4. Chronic right maxillary sinusitis. 5. Aortic and coronary atherosclerosis.   Aortic Atherosclerosis (ICD10-I70.0) and Emphysema (ICD10-J43.9)   08/19/2023 Initial Diagnosis   Adenocarcinoma of right lung  -Patient has a history of lung fibrosis and centrilobular emphysema.  She follows up with pulmonology. Patient had high-resolution CT scan which showed a right lung nodule.  Pulmonary nodule appears significantly progressed compared to prior imaging in 2016.  PET scan was obtained at that time however imaging was not done due to issues  with insurance coverage. Patient reestablish care with pulmonology and additional imaging including high-resolution CT scan and a PET scan showed hypermetabolic right lower lung nodule.  08/11/2023 patient went bronchoscopy and right lower lobe nodule biopsy showed adenocarcinoma. EBUS to station  11L, 7, 4R, and 11R superior was negative for malignancy   NGS showed KRAS, TP53, STAG2. TMB 7.9,TPS 60% CPS 65%   08/19/2023 Cancer Staging   Staging form: Lung, AJCC V9 - Clinical: Stage IA2 (cT1b, cN0, cM0) - Signed by Babara Call, MD on 08/19/2023   09/29/2023 - 10/13/2023 Radiation Therapy   S/p SBRT to Right upper lobe    She developed acute respiratory failure in July 2025. Currently she is on nasal cannula oxygen  2L at rest, 6L during ambulation. SABRA    MEDICAL HISTORY:  Past Medical History:  Diagnosis Date   Allergy Idk   Arthritis Idk   Asthma    Cancer (HCC)    Cataract Idk   COPD (chronic obstructive pulmonary disease) (HCC)    Dyspnea    Emphysema lung (HCC)    Emphysema of lung (HCC) Idk   GERD (gastroesophageal reflux disease)    Lung cancer (HCC) 2024   Lung nodule    Migraines    Pulmonary fibrosis (HCC)    Seasonal allergies     SURGICAL HISTORY: Past Surgical History:  Procedure Laterality Date   CESAREAN SECTION     EYE SURGERY Bilateral 01/2023   RIGHT HEART CATH Right 04/26/2024   Procedure: RIGHT HEART CATH;  Surgeon: Mady Bruckner, MD;  Location: [facility] INVASIVE CV LAB;  Service: Cardiovascular;  Laterality: Right;   TUBAL LIGATION      SOCIAL HISTORY: Social History   Socioeconomic History   Marital status: Widowed    Spouse name: Not on file   Number of children: 3   Years of education: Not on file   Highest education level: Some college, no degree  Occupational History   Occupation: Lexicographer houses    Employer: OTHER  Tobacco Use   Smoking status: Former    Current packs/day: 0.00    Average packs/day: 1.5 packs/day for 81.7 years (121.3 ttl pk-yrs)    Types: Cigarettes    Start date: 32    Quit date: 02/22/2023    Years since quitting: 1.2   Smokeless tobacco: Never   Tobacco comments:    Stopped in September of 2024 .hfb RN  Vaping Use   Vaping status: Never Used  Substance and Sexual Activity   Alcohol use: Yes     Alcohol/week: 0.0 standard drinks of alcohol    Comment: occ   Drug use: No   Sexual activity: Not on file  Other Topics Concern   Not on file  Social History Narrative   Not on file   Social Drivers of Health   Financial Resource Strain: Not on file  Food Insecurity: Food Insecurity Present (12/25/2023)   Hunger Vital Sign    Worried About Running Out of Food in the Last Year: Sometimes true    Ran Out of Food in the Last Year: Sometimes true  Transportation Needs: No Transportation Needs (12/25/2023)   PRAPARE - Administrator, Civil Service (Medical): No    Lack of Transportation (Non-Medical): No  Physical Activity: Not on file  Stress: Not on file  Social Connections: Unknown (12/22/2022)   [facility]   Social Network    Social Network: Not on file  Intimate Partner Violence: Not At Risk (  12/25/2023)   Humiliation, Afraid, Rape, and Kick questionnaire    Fear of Current or Ex-Partner: No    Emotionally Abused: No    Physically Abused: No    Sexually Abused: No    FAMILY HISTORY: Family History  Problem Relation Age of Onset   Lung cancer Father        was a smoker   Cancer Father    Heart disease Maternal Grandfather    Emphysema Mother    Arthritis Mother    Asthma Mother    COPD Mother    Vision loss Mother    Varicose Veins Mother    Miscarriages / Stillbirths Sister    Miscarriages / Stillbirths Sister     ALLERGIES:  is allergic to amoxicillin.  MEDICATIONS:  Current Outpatient Medications  Medication Sig Dispense Refill   acetaminophen  (TYLENOL ) 500 MG tablet Take 1,500 mg by mouth 3 (three) times daily. 2 to 3 times a day.     aspirin  EC 81 MG tablet Take 1 tablet (81 mg total) by mouth daily. Swallow whole.     budesonide -formoterol  (SYMBICORT ) 160-4.5 MCG/ACT inhaler Inhale 2 puffs into the lungs daily. 1 each 12   buPROPion  (WELLBUTRIN  SR) 150 MG 12 hr tablet TAKE 1 TABLET(150 MG) BY MOUTH TWICE DAILY 60 tablet 0    fluticasone  (FLONASE ) 50 MCG/ACT nasal spray Place 2 sprays into both nostrils daily. (Patient taking differently: Place 2 sprays into both nostrils daily as needed for allergies.) 16 g 0   gabapentin  (NEURONTIN ) 300 MG capsule TAKE 1 CAPSULE(300 MG) BY MOUTH AT BEDTIME 90 capsule 0   ipratropium (ATROVENT ) 0.06 % nasal spray Place 2 sprays into both nostrils 4 (four) times daily as needed (allergies).     ipratropium-albuterol  (DUONEB) 0.5-2.5 (3) MG/3ML SOLN Take 3 mLs by nebulization 2 (two) times daily. (Patient taking differently: Take 3 mLs by nebulization 2 (two) times daily as needed (breathing).) 360 mL 0   Loperamide  HCl (LOPERAMIDE  A-D PO) Take 1 tablet by mouth daily as needed.     loratadine  (CLARITIN ) 10 MG tablet Take 10 mg by mouth daily.     naproxen  (NAPROSYN ) 500 MG tablet TAKE 1 TABLET(500 MG) BY MOUTH TWICE DAILY WITH A MEAL 180 tablet 0   Nintedanib (OFEV) 100 MG CAPS Take 1 capsule (100 mg total) by mouth 2 (two) times daily. 60 capsule 2   nystatin  (MYCOSTATIN ) 100000 UNIT/ML suspension SHAKE LIQUID AND TAKE 5 ML BY MOUTH FOUR TIMES DAILY (Patient taking differently: Take 5 mLs by mouth 4 (four) times daily as needed (thrush).) 60 mL 2   omeprazole  (PRILOSEC) 40 MG capsule Take 1 capsule (40 mg total) by mouth in the morning and at bedtime. 60 capsule 11   ondansetron  (ZOFRAN ) 4 MG tablet Take 1 tablet (4 mg total) by mouth every 8 (eight) hours as needed for nausea or vomiting. Take this 30 minutes before each pirfenidone  dose 90 tablet 1   ondansetron  (ZOFRAN -ODT) 4 MG disintegrating tablet Take 1 tablet (4 mg total) by mouth every 8 (eight) hours as needed for nausea or vomiting. 30 tablet 2   Tiotropium Bromide  Monohydrate (SPIRIVA  RESPIMAT) 2.5 MCG/ACT AERS Inhale 2 puffs into the lungs daily. 4 g 12   VENTOLIN  HFA 108 (90 Base) MCG/ACT inhaler INHALE 2 PUFFS INTO THE LUNGS EVERY 6 HOURS AS NEEDED FOR WHEEZING OR SHORTNESS OF BREATH 18 g 1   No current  facility-administered medications for this visit.    Review of Systems  Constitutional:  Negative for appetite change, chills, fatigue and fever.  HENT:   Negative for hearing loss and voice change.   Eyes:  Negative for eye problems.  Respiratory:  Positive for shortness of breath. Negative for chest tightness and cough.   Cardiovascular:  Negative for chest pain.  Gastrointestinal:  Negative for abdominal distention, abdominal pain and blood in stool.  Endocrine: Negative for hot flashes.  Genitourinary:  Negative for difficulty urinating and frequency.   Musculoskeletal:  Negative for arthralgias.  Skin:  Negative for itching and rash.  Neurological:  Negative for extremity weakness.  Hematological:  Negative for adenopathy.  Psychiatric/Behavioral:  Negative for confusion.      PHYSICAL EXAMINATION: ECOG PERFORMANCE STATUS: 1 - Symptomatic but completely ambulatory  Vitals:   05/11/24 1338  BP: 105/70  Pulse: 91  Resp: 18  Temp: (!) 97.2 F (36.2 C)  SpO2: 90%   Filed Weights   05/11/24 1338  Weight: 113 lb 8 oz (51.5 kg)    Physical Exam Constitutional:      General: She is not in acute distress.    Appearance: She is not diaphoretic.  HENT:     Head: Normocephalic and atraumatic.  Eyes:     General: No scleral icterus. Cardiovascular:     Rate and Rhythm: Normal rate and regular rhythm.     Heart sounds: No murmur heard. Pulmonary:     Effort: Pulmonary effort is normal. No respiratory distress.     Breath sounds: No wheezing.     Comments: Decreased breath sounds bilaterally Abdominal:     General: There is no distension.     Palpations: Abdomen is soft.     Tenderness: There is no abdominal tenderness.  Musculoskeletal:        General: Normal range of motion.     Cervical back: Normal range of motion and neck supple.  Skin:    General: Skin is warm and dry.     Findings: No erythema.  Neurological:     Mental Status: She is alert and oriented to  person, place, and time. Mental status is at baseline.     Cranial Nerves: No cranial nerve deficit.     Motor: No abnormal muscle tone.  Psychiatric:        Mood and Affect: Mood and affect normal.      LABORATORY DATA:  I have reviewed the data as listed    Latest Ref Rng & Units 05/11/2024    1:28 PM 04/26/2024   10:10 AM 04/19/2024    9:25 AM  CBC  WBC 4.0 - 10.5 K/uL 8.6   9.5   Hemoglobin 12.0 - 15.0 g/dL 86.9  88.3  88.2   Hematocrit 36.0 - 46.0 % 38.7  34.0  33.9   Platelets 150 - 400 K/uL 593   426       Latest Ref Rng & Units 05/11/2024    1:28 PM 04/26/2024   10:10 AM 04/19/2024    9:25 AM  CMP  Glucose 70 - 99 mg/dL 887   95   BUN 6 - 20 mg/dL 14   10   Creatinine 9.55 - 1.00 mg/dL 9.16   9.23   Sodium 864 - 145 mmol/L 137  140  139   Potassium 3.5 - 5.1 mmol/L 3.3  3.7  3.9   Chloride 98 - 111 mmol/L 105   104   CO2 22 - 32 mmol/L 21   17   Calcium 8.9 - 10.3 mg/dL  8.8   8.8      RADIOGRAPHIC STUDIES: I have personally reviewed the radiological images as listed and agreed with the findings in the report. CARDIAC CATHETERIZATION Result Date: 04/26/2024 Conclusions: Upper normal left heart filling pressure (PCWP 15 mmHg). Borderline elevated pulmonary artery pressure (PA 33/15, mean 21 mmHg). Normal right heart filling pressure (RA 4 mmHg). Normal Fick cardiac output/index (CO 3.8 L/min, CI 2.6 L/min/m^2). Recommendations: Maintain net even to slightly negative fluid balance. Ongoing management per Drs. Agbor-Etang and Ramaswamy. Lonni Hanson, MD [facility]

## 2024-05-11 NOTE — Assessment & Plan Note (Signed)
 Chronic macrocytosis without anemia due to folate deficiency.  Resolved.

## 2024-05-11 NOTE — Assessment & Plan Note (Signed)
 Recommend folic acid  supplementation. Check Folate level at next visit.

## 2024-05-13 ENCOUNTER — Other Ambulatory Visit: Payer: Self-pay

## 2024-05-13 DIAGNOSIS — Z720 Tobacco use: Secondary | ICD-10-CM

## 2024-05-13 MED ORDER — BUPROPION HCL ER (SR) 150 MG PO TB12: 150.0000 mg | ORAL_TABLET | Freq: Two times a day (BID) | ORAL | 0 refills | Status: DC

## 2024-05-13 NOTE — Telephone Encounter (Signed)
 Refilled 05/13/24 # 60. Requested Prescriptions  Refused Prescriptions Disp Refills   buPROPion  (WELLBUTRIN  SR) 150 MG 12 hr tablet [Pharmacy Med Name: BUPROPION  SR 150MG  TABLETS (12 H)] 60 tablet 0    Sig: TAKE 1 TABLET(150 MG) BY MOUTH TWICE DAILY     Psychiatry: Antidepressants - bupropion  Failed - 05/13/2024 11:01 AM      Failed - Valid encounter within last 6 months    Recent Outpatient Visits           7 months ago Adenocarcinoma of right lung [facility])   [facility] [place], Marsa PARAS, DO       Future Appointments             In 2 weeks Agbor-Etang, Redell, MD [facility] - Cr in normal range and within 360 days    Creatinine  Date Value Ref Range Status  05/11/2024 0.83 0.44 - 1.00 mg/dL Final   Creat  Date Value Ref Range Status  11/28/2022 0.75 0.50 - 1.03 mg/dL Final         Passed - AST in normal range and within 360 days    AST  Date Value Ref Range Status  04/07/2024 19 0 - 37 U/L Final  08/19/2023 45 (H) 15 - 41 U/L Final         Passed - ALT in normal range and within 360 days    ALT  Date Value Ref Range Status  04/07/2024 10 0 - 35 U/L Final  08/19/2023 32 0 - 44 U/L Final         Passed - Last BP in normal range    BP Readings from Last 1 Encounters:  05/11/24 105/70

## 2024-05-17 ENCOUNTER — Other Ambulatory Visit: Payer: Self-pay | Admitting: Family Medicine

## 2024-05-17 DIAGNOSIS — M7712 Lateral epicondylitis, left elbow: Secondary | ICD-10-CM

## 2024-05-17 DIAGNOSIS — G8929 Other chronic pain: Secondary | ICD-10-CM

## 2024-05-18 NOTE — Addendum Note (Signed)
 Addended by: Jovani Flury L on: 05/18/2024 11:06 AM   Modules accepted: Orders

## 2024-05-18 NOTE — Telephone Encounter (Signed)
 Requested Prescriptions  Pending Prescriptions Disp Refills   gabapentin  (NEURONTIN ) 300 MG capsule [Pharmacy Med Name: GABAPENTIN  300MG  CAPSULES] 90 capsule 0    Sig: TAKE 1 CAPSULE(300 MG) BY MOUTH AT BEDTIME     Neurology: Anticonvulsants - gabapentin  Passed - 05/18/2024  3:42 PM      Passed - Cr in normal range and within 360 days    Creatinine  Date Value Ref Range Status  05/11/2024 0.83 0.44 - 1.00 mg/dL Final   Creat  Date Value Ref Range Status  11/28/2022 0.75 0.50 - 1.03 mg/dL Final         Passed - Completed PHQ-2 or PHQ-9 in the last 360 days      Passed - Valid encounter within last 12 months    Recent Outpatient Visits           7 months ago Adenocarcinoma of right lung [facility])   [facility] [place], Marsa PARAS, DO       Future Appointments             In 2 weeks Agbor-Etang, Redell, MD [facility] at [facility]

## 2024-05-18 NOTE — Telephone Encounter (Signed)
 Appears labs were ordered but not obtained. Next OV with Izetta Rouleau, NP, on 05/26/24. Will plan for patient to get labs at that time.

## 2024-05-26 ENCOUNTER — Encounter: Payer: Self-pay | Admitting: Nurse Practitioner

## 2024-05-26 ENCOUNTER — Ambulatory Visit: Payer: Self-pay | Admitting: Nurse Practitioner

## 2024-05-26 ENCOUNTER — Ambulatory Visit (INDEPENDENT_AMBULATORY_CARE_PROVIDER_SITE_OTHER)

## 2024-05-26 ENCOUNTER — Telehealth: Payer: Self-pay | Admitting: Nurse Practitioner

## 2024-05-26 ENCOUNTER — Ambulatory Visit: Admitting: Nurse Practitioner

## 2024-05-26 VITALS — BP 92/60 | HR 102 | Temp 97.6°F | Ht 60.0 in | Wt 109.6 lb

## 2024-05-26 DIAGNOSIS — J84112 Idiopathic pulmonary fibrosis: Secondary | ICD-10-CM | POA: Diagnosis not present

## 2024-05-26 DIAGNOSIS — J22 Unspecified acute lower respiratory infection: Secondary | ICD-10-CM

## 2024-05-26 DIAGNOSIS — J4489 Other specified chronic obstructive pulmonary disease: Secondary | ICD-10-CM

## 2024-05-26 DIAGNOSIS — R091 Pleurisy: Secondary | ICD-10-CM | POA: Diagnosis not present

## 2024-05-26 DIAGNOSIS — Z85818 Personal history of malignant neoplasm of other sites of lip, oral cavity, and pharynx: Secondary | ICD-10-CM

## 2024-05-26 DIAGNOSIS — Z5181 Encounter for therapeutic drug level monitoring: Secondary | ICD-10-CM

## 2024-05-26 DIAGNOSIS — J441 Chronic obstructive pulmonary disease with (acute) exacerbation: Secondary | ICD-10-CM

## 2024-05-26 DIAGNOSIS — Z87891 Personal history of nicotine dependence: Secondary | ICD-10-CM | POA: Diagnosis not present

## 2024-05-26 DIAGNOSIS — R0602 Shortness of breath: Secondary | ICD-10-CM | POA: Diagnosis not present

## 2024-05-26 DIAGNOSIS — R634 Abnormal weight loss: Secondary | ICD-10-CM | POA: Diagnosis not present

## 2024-05-26 DIAGNOSIS — C3491 Malignant neoplasm of unspecified part of right bronchus or lung: Secondary | ICD-10-CM

## 2024-05-26 DIAGNOSIS — K521 Toxic gastroenteritis and colitis: Secondary | ICD-10-CM | POA: Diagnosis not present

## 2024-05-26 DIAGNOSIS — R918 Other nonspecific abnormal finding of lung field: Secondary | ICD-10-CM | POA: Diagnosis not present

## 2024-05-26 DIAGNOSIS — R630 Anorexia: Secondary | ICD-10-CM

## 2024-05-26 DIAGNOSIS — T50995A Adverse effect of other drugs, medicaments and biological substances, initial encounter: Secondary | ICD-10-CM

## 2024-05-26 DIAGNOSIS — R058 Other specified cough: Secondary | ICD-10-CM | POA: Diagnosis not present

## 2024-05-26 DIAGNOSIS — J188 Other pneumonia, unspecified organism: Secondary | ICD-10-CM

## 2024-05-26 DIAGNOSIS — I272 Pulmonary hypertension, unspecified: Secondary | ICD-10-CM | POA: Diagnosis not present

## 2024-05-26 LAB — BASIC METABOLIC PANEL WITH GFR
BUN: 11 mg/dL (ref 6–23)
CO2: 25 meq/L (ref 19–32)
Calcium: 9.6 mg/dL (ref 8.4–10.5)
Chloride: 100 meq/L (ref 96–112)
Creatinine, Ser: 0.75 mg/dL (ref 0.40–1.20)
GFR: 88.43 mL/min (ref >60.00)
Glucose, Bld: 78 mg/dL (ref 70–99)
Potassium: 3.5 meq/L (ref 3.5–5.1)
Sodium: 137 meq/L (ref 135–145)

## 2024-05-26 LAB — HEPATIC FUNCTION PANEL
ALT: 17 U/L (ref 0–35)
AST: 28 U/L (ref 0–37)
Albumin: 3.8 g/dL (ref 3.5–5.2)
Alkaline Phosphatase: 169 U/L — ABNORMAL HIGH (ref 39–117)
Bilirubin, Direct: 0 mg/dL (ref 0.0–0.3)
Total Bilirubin: 0.3 mg/dL (ref 0.2–1.2)
Total Protein: 7.3 g/dL (ref 6.0–8.3)

## 2024-05-26 MED ORDER — CEFPODOXIME PROXETIL 200 MG PO TABS: 200.0000 mg | ORAL_TABLET | Freq: Two times a day (BID) | ORAL | 0 refills | Status: AC

## 2024-05-26 MED ORDER — DOXYCYCLINE HYCLATE 100 MG PO TABS: 100.0000 mg | ORAL_TABLET | Freq: Two times a day (BID) | ORAL | 0 refills | Status: AC

## 2024-05-26 MED ORDER — PREDNISONE 20 MG PO TABS: 40.0000 mg | ORAL_TABLET | Freq: Every day | ORAL | 0 refills | Status: AC

## 2024-05-26 NOTE — Progress Notes (Signed)
 CXR concerning for pneumonia. Adding on cefpodoxime twice daily for 7 days for her to take with the doxycyline and prednisone . Cefpodoxime is a cephalosporin and usually very well tolerated. It can have a cross sensitivity in patients who have had an anaphylactic response to pcns (she's allergic to amoxicillin) but since her allergy was just hives so would not expect her to have any abnormal response. If she develops any hives, swelling of the face/tongue, allergy symptoms, stop and go to the ED. Needs repeat CXR when she comes back. Thanks.

## 2024-05-26 NOTE — Telephone Encounter (Addendum)
 Per Izetta Rouleau - Pt having significant diarrhea and 10 lb weight loss with change from Esbriet  to Ofev , despite low dose and utilizing antidiarrheal agents. I have discontinued her Ofev  and I want her to take a 2 week medication holiday. After this time, would like to get her restarted on pirfenidone  - 1 tablet tid for one week, 2 tablets tid for one week then back to her 801 mg tid dosing as she was tolerating well before. Can we resubmit PA to start [name] since she has tried the preferred drugs at this point? Thanks!  Attempted to submit pa for [name] but it was cancelled because there is already a denial on file. Will need to submit appeal.  Key: [id number]

## 2024-05-26 NOTE — Patient Instructions (Addendum)
 Continue Albuterol  inhaler 2 puffs or 3 mL neb every 6 hours as needed for shortness of breath or wheezing. Notify if symptoms persist despite rescue inhaler/neb use.  Continue Symbicort  2 puffs Twice daily. Brush tongue and rinse mouth afterwards Continue Spiriva  2 puffs daily Continue flonase  nasal spray  Continue claritin  daily Continue ondansetron  4 mg every 8 hours as needed for nausea/vomiting Continue supplemental oxygen  2 lpm at rest and 4-6 lpm with activity for goal >88-90% Continue omeprazole    Doxycycline  1 tab Twice daily for 7 days. Take with food. Wear sunscreen when outside Prednisone  40 mg daily for 5 days. Take in AM with food  Guaifenesin  600 mg Twice daily for cough/congestion   Stop Ofev . We will likely restart your Esbriet  (pirfenidone ) and should be able to add the [name] on now that you haven't been able to tolerate Ofev , but I want you to stay off of everything for 2 weeks and let the diarrhea subside. Monitor your weights at home. If you're continuing to lose weight, have a decreased appetite, and having the discomfort on your right side, we will likely move up the timing of your CT chest scan  Add on Boosts or Ensures 2-3 times a day    Chest x ray today  Labs today    Follow up in as scheduled with Dr. Geronimo in January. Schedule spiro/DLCO prior to visit 1/8, if possible. If symptoms do not improve or worsen, please contact office for sooner follow up or seek emergency care.

## 2024-05-26 NOTE — Telephone Encounter (Signed)
 Pt having significant diarrhea and 10 lb weight loss with change from Esbriet  to Ofev , despite low dose and utilizing antidiarrheal agents. I have discontinued her Ofev  and I want her to take a 2 week medication holiday. After this time, would like to get her restarted on pirfenidone  - 1 tablet tid for one week, 2 tablets tid for one week then back to her 801 mg tid dosing as she was tolerating well before. Can we resubmit PA to start [name] since she has tried the preferred drugs at this point? Thanks!

## 2024-05-26 NOTE — Addendum Note (Signed)
 Addended by: Bethanee Redondo V on: 05/26/2024 03:21 PM   Modules accepted: Orders

## 2024-05-26 NOTE — Progress Notes (Signed)
 @Patient  ID: Joy Ball, female    DOB: Jul 01, 1965, 58 y.o.   MRN: [id number]  Chief Complaint  Patient presents with   Interstitial Lung Disease   Hospitalization Follow-up    Hospital follow up  11-4    Referring provider: Edman Blunt *  HPI: 58 year old female, former smoker followed for IPF, COPD and chronic respiratory failure. She is a patient of Dr. Reeves and last seen in office 04/07/2024. She has a history of adenocarcinoma s/p SBRT to RUL stage Ia NSCLC. Past medical history significant for GERD, severe protein calorie malnutrition.   TEST/EVENTS:  11/23/2023 PFT: FVC 70, FEV1 66, ratio 73, TLC 77, DLCO 39 02/12/2024 CT chest w contrast: atherosclerosis. CAD. Prominent mediastinal nodes, stable from prior. Masslike consolidation in LUL; favor to reflect postradiation change. New coarse interstitial thickening and ground-glass in RUL with btx; posttreatment change. Similar chronic lung disease.  04/07/2024 PFT: FVC 42, FEV1 40, ratio 74, TLC 47, DLCO 22 04/26/2024 cardiac cath: upper normal left heart filling pressure; borderline elevated pulmonary artery pressure. Normal right heart filling pressure. Normal CO/CI  01/13/2024: OV with Dr. Geronimo. Former heavy smoker; quit in September 2024. Has pulmonary fibrosis/ILD since 2013. Dr. Creston took care of her and approach was supportive care. Both sisters and maternal grandmother had IPF. ILD was likely stable but started progressing summer 2024 based on imaging then significant progression by early 2025. Hospitalized July 2025 for acute on chronic respiratory failure CTA shows groundglass opacities. Possible ILD flareup, probably related to radiation or virus. Now on 4 lpm O2. Sats 82% at rest on room air and corrects to 91% with 2 lpm at rest.  Start pirfenidone . Refer to pulmonary rehab at [facility]. Take ILD questionnaire and bring back.  Continue Spiriva  and Symbicort . Continue 2 lpm at rest and 4-6 lpm with  exertion. Await echo.   03/07/2024: OV with Joy Hassan NP JENAVIVE LAMBOY is a 58 year old female with pulmonary fibrosis who presents for follow up. She is accompanied by her boyfriend. She experiences nausea and takes Zofran  30 minutes before her pirfenidone  doses, primarily in the morning and at night. Occasional vomiting had occurred prior to starting, but Zofran  makes the nausea more tolerable. She experiences acid reflux and takes over-the-counter omeprazole , doubling the dose to 40 mg to manage symptoms. This helps, but the cost is becoming prohibitive.  She is on pirfenidone , taking three pills three times a day, spaced five to six hours apart. A dry cough is present and stable. She uses oxygen  therapy, requiring two liters at rest and 4-6 liters when active. Despite this, her oxygen  levels drop to 60-70s at home. Pulmonary rehab has not started due to her boyfriend's surgery. She needs to call them back to reschedule. Weight loss is noted, from 125 pounds in July to 117 pounds currently. Her breathing feels stable. No night sweats, fevers, wheezing, chest congestion.  She experiences occasional ankle swelling, when she's on her feet for longer periods. She is scheduled for a nuclear medicine test to assess heart function. She uses Symbicort , Spiriva , and albuterol  as needed, with albuterol  used once every other day, which she finds helpful.  05/26/2024: Today - follow up Discussed the use of AI scribe software for clinical note transcription with the patient, who gave verbal consent to proceed.  History of Present Illness  JAKIRAH Ball is a 58 year old female with pulmonary fibrosis who presents with diarrhea and weight loss after starting Ofev .  She  has been experiencing significant diarrhea and weight loss since starting Ofev  for her pulmonary fibrosis, losing ten pounds since switching from Ofev  to Esbriet . Despite trying medications to manage the diarrhea, she has not found relief. Previously,  she was on Esbriet  (pirfenidone ) which she tolerated with some nausea that was easily managed. She is currently taking Ofev  at a dose of 100 mg twice a day. No abdominal pain, nausea/vomiting. She has a reduced appetite and fatigue.   She reports a new onset of pain on her right side/lateral chest, which started about two weeks ago as well as a productive cough with yellow sputum. The pain is constant and worsens with touch/movement. It is sharp. She has been more short of breath. No hemoptysis or fever. No known sick exposures.   Her current medications include nebulizer treatments with albuterol  twice a day, which she finds helpful, and she is using her Symbicort  and Spiriva .     Allergies  Allergen Reactions   Amoxicillin Hives    Immunization History  Administered Date(s) Administered   Influenza, Seasonal, Injecte, Preservative Fre 04/07/2024   Influenza,inj,Quad PF,6+ Mos 07/27/2015   Influenza-Unspecified 02/24/2014   PNEUMOCOCCAL CONJUGATE-20 04/07/2024    Past Medical History:  Diagnosis Date   Allergy Idk   Arthritis Idk   Asthma    Cancer (HCC)    Cataract Idk   COPD (chronic obstructive pulmonary disease) (HCC)    Dyspnea    Emphysema lung (HCC)    Emphysema of lung (HCC) Idk   GERD (gastroesophageal reflux disease)    Lung cancer (HCC) 2024   Lung nodule    Migraines    Pulmonary fibrosis (HCC)    Seasonal allergies     Tobacco History: Social History   Tobacco Use  Smoking Status Former   Current packs/day: 0.00   Average packs/day: 1.5 packs/day for 81.7 years (121.3 ttl pk-yrs)   Types: Cigarettes   Start date: 63   Quit date: 02/22/2023   Years since quitting: 1.2  Smokeless Tobacco Never  Tobacco Comments   Stopped in September of 2024 .hfb RN   Counseling given: Not Answered Tobacco comments: Stopped in September of 2024 .hfb RN   Outpatient Medications Prior to Visit  Medication Sig Dispense Refill   acetaminophen  (TYLENOL ) 500 MG  tablet Take 1,500 mg by mouth 3 (three) times daily. 2 to 3 times a day.     aspirin  EC 81 MG tablet Take 1 tablet (81 mg total) by mouth daily. Swallow whole.     budesonide -formoterol  (SYMBICORT ) 160-4.5 MCG/ACT inhaler Inhale 2 puffs into the lungs daily. 1 each 12   buPROPion  (WELLBUTRIN  SR) 150 MG 12 hr tablet Take 1 tablet (150 mg total) by mouth 2 (two) times daily. 60 tablet 0   fluticasone  (FLONASE ) 50 MCG/ACT nasal spray Place 2 sprays into both nostrils daily. (Patient taking differently: Place 2 sprays into both nostrils daily as needed for allergies.) 16 g 0   gabapentin  (NEURONTIN ) 300 MG capsule TAKE 1 CAPSULE(300 MG) BY MOUTH AT BEDTIME 90 capsule 0   ipratropium (ATROVENT ) 0.06 % nasal spray Place 2 sprays into both nostrils 4 (four) times daily as needed (allergies).     ipratropium-albuterol  (DUONEB) 0.5-2.5 (3) MG/3ML SOLN Take 3 mLs by nebulization 2 (two) times daily. (Patient taking differently: Take 3 mLs by nebulization 2 (two) times daily as needed (breathing).) 360 mL 0   Loperamide  HCl (LOPERAMIDE  A-D PO) Take 1 tablet by mouth daily as needed.  loratadine  (CLARITIN ) 10 MG tablet Take 10 mg by mouth daily.     naproxen  (NAPROSYN ) 500 MG tablet TAKE 1 TABLET(500 MG) BY MOUTH TWICE DAILY WITH A MEAL 180 tablet 0   Nintedanib (OFEV ) 100 MG CAPS Take 1 capsule (100 mg total) by mouth 2 (two) times daily. 60 capsule 2   nystatin  (MYCOSTATIN ) 100000 UNIT/ML suspension SHAKE LIQUID AND TAKE 5 ML BY MOUTH FOUR TIMES DAILY (Patient taking differently: Take 5 mLs by mouth 4 (four) times daily as needed (thrush).) 60 mL 2   omeprazole  (PRILOSEC) 40 MG capsule Take 1 capsule (40 mg total) by mouth in the morning and at bedtime. 60 capsule 11   ondansetron  (ZOFRAN ) 4 MG tablet Take 1 tablet (4 mg total) by mouth every 8 (eight) hours as needed for nausea or vomiting. Take this 30 minutes before each pirfenidone  dose 90 tablet 1   ondansetron  (ZOFRAN -ODT) 4 MG disintegrating tablet  Take 1 tablet (4 mg total) by mouth every 8 (eight) hours as needed for nausea or vomiting. 30 tablet 2   Tiotropium Bromide  Monohydrate (SPIRIVA  RESPIMAT) 2.5 MCG/ACT AERS Inhale 2 puffs into the lungs daily. 4 g 12   VENTOLIN  HFA 108 (90 Base) MCG/ACT inhaler INHALE 2 PUFFS INTO THE LUNGS EVERY 6 HOURS AS NEEDED FOR WHEEZING OR SHORTNESS OF BREATH 18 g 1   No facility-administered medications prior to visit.     Review of Systems: as above    Physical Exam:  BP 92/60   Pulse (!) 102   Temp 97.6 F (36.4 C) (Oral)   Ht 5' (1.524 m)   Wt 109 lb 9.6 oz (49.7 kg)   SpO2 91% Comment: on 2L  BMI 21.40 kg/m   GEN: Pleasant, interactive, chronically-ill appearing; in no acute distress HEENT:  Normocephalic and atraumatic. PERRLA. Sclera white. Nasal turbinates pink, moist and patent bilaterally. No rhinorrhea present. Oropharynx pink and moist, without exudate or edema. No lesions, ulcerations, or postnasal drip.  NECK:  Supple w/ fair ROM. No JVD present.  No lymphadenopathy.   CV: RRR, no m/r/g, no peripheral edema. Pulses intact, +2 bilaterally. No cyanosis, pallor or clubbing. PULMONARY:  Unlabored, regular breathing. Bibasilar crackles bilaterally A&P w/o wheezes/rales/rhonchi. No accessory muscle use.  GI: BS present and normoactive. Soft, non-tender to palpation. MSK: No erythema, warmth or tenderness. Cap refil <2 sec all extrem.  Neuro: A/Ox3. No focal deficits noted.   Skin: Warm, no lesions or rashe Psych: Normal affect and behavior. Judgement and thought content appropriate.     Lab Results:  CBC    Component Value Date/Time   WBC 8.6 05/11/2024 1328   WBC 8.0 12/30/2023 0417   RBC 3.87 05/11/2024 1328   HGB 13.0 05/11/2024 1328   HGB 11.7 04/19/2024 0925   HCT 38.7 05/11/2024 1328   HCT 33.9 (L) 04/19/2024 0925   PLT 593 (H) 05/11/2024 1328   PLT 426 04/19/2024 0925   MCV 100.0 05/11/2024 1328   MCV 102 (H) 04/19/2024 0925   MCH 33.6 05/11/2024 1328   MCHC  33.6 05/11/2024 1328   RDW 14.1 05/11/2024 1328   RDW 12.5 04/19/2024 0925   LYMPHSABS 2.0 12/24/2023 2147   MONOABS 0.9 12/24/2023 2147   EOSABS 0.1 12/24/2023 2147   BASOSABS 0.1 12/24/2023 2147    BMET    Component Value Date/Time   NA 137 05/11/2024 1328   NA 139 04/19/2024 0925   K 3.3 (L) 05/11/2024 1328   CL 105 05/11/2024 1328  CO2 21 (L) 05/11/2024 1328   GLUCOSE 112 (H) 05/11/2024 1328   BUN 14 05/11/2024 1328   BUN 10 04/19/2024 0925   CREATININE 0.83 05/11/2024 1328   CREATININE 0.75 11/28/2022 0915   CALCIUM 8.8 (L) 05/11/2024 1328   GFRNONAA >60 05/11/2024 1328   GFRNONAA >89 10/29/2016 1225   GFRAA >89 10/29/2016 1225    BNP No results found for: BNP   Imaging:  No results found.   Administration History     None          Latest Ref Rng & Units 04/07/2024    9:14 AM 11/23/2023   10:45 AM 08/31/2023    9:59 AM 01/01/2023    2:22 PM  PFT Results  FVC-Pre L 1.28  2.17  2.25  2.15   FVC-Predicted Pre % 42  70  73  69   FVC-Post L   2.26  2.37   FVC-Predicted Post %   73  77   Pre FEV1/FVC % % 74  73  68  66   Post FEV1/FCV % %   73  71   FEV1-Pre L 0.95  1.59  1.53  1.42   FEV1-Predicted Pre % 40  66  64  58   FEV1-Post L   1.65  1.67   DLCO uncorrected ml/min/mmHg 4.24  7.27  6.65  5.35   DLCO UNC% % 22  39  35  28   DLVA Predicted % 44  55  47  33   TLC L 2.17  3.55  3.47  3.17   TLC % Predicted % 47  77  75  68   RV % Predicted % 48  80  69  69     No results found for: NITRICOXIDE      Assessment & Plan:   Assessment & Plan Idiopathic pulmonary fibrosis IPF with progression. Currently on nintedanib (Ofev ) but experiencing significant diarrhea, anorexia and weight loss. Will discontinue. Tolerating Esbriet  with minimal nausea, easily managed with PRN zofran . Will have her take a two week medication holiday and reach out the pharmacy team to resume pirfenidone /Esbriet . Given she has failed Ofev , should be able to obtain  approval on addition of [name]. Will send message to pharmacy team and Dr. Geronimo regarding this as well.  - Stop Ofev  and hold antifibrotics for two weeks - Will switch back to pirfenidone  (Esbriet ) 801 mg tid with taper from 267 mg tid for a week to 534 mg tid for a week then to full dose, if tolerating well - Will coordinate with pharmacy team to add [name] given progression on pirfenidone   - Labs for medication monitoring today  - Encouraged to work on graded exercises - Spiro/DLCO at follow up  Acute lower respiratory tract infection/AECOPD Cough with purulent sputum, shortness of breath, and pleurisy/costochondritis. Symptoms began two weeks ago. Worrisome for infectious process. Will obtain CXR and start empiric doxycycline  x 7 days. Treat pleurisy/dyspnea with prednisone  burst. Cough control measures. Close follow up and strict ED precautions for worsening/failure to improve. No fever or hemoptysis reported. Action plan in place - Prescribed doxycycline  for 7 days - Prescribed prednisone  40 mg daily for 5 days - Ordered chest x-ray to rule out pneumonia - Continue symbicort , spiriva  and PRN albuterol   Medication-induced diarrhea due to nintedanib (Ofev ) Significant diarrhea attributed to nintedanib (Ofev ). Previous management with Esbriet  was better tolerated - See above  Unintentional weight loss Weight loss of 10 pounds in one month, likely related  to Ofev -induced diarrhea and anorexia. Potential concern for cancer recurrence if weight loss/anorexia persists despite Ofev  discontinuation. Will reassess off medication and determine if timing of CT chest needs to be adjusted - Monitor weight closely at home - Consider nutritional supplements like Boost or Ensure to increase caloric/protein intake - If weight loss persists, will consider moving up CT scan for cancer follow-up  Pulmonary hypertension, mild Borderline pulmonary hypertension noted on recent heart catheterization. No  current treatment required, but condition will be monitored. - Continue monitoring pulmonary hypertension  History of lung cancer S/p SBRT. Follow-up CT scan scheduled for February. See above. Follow up with radiation oncology as scheduled  - Monitor for signs of cancer recurrence - Will consider moving up CT scan if weight loss persists     Advised if symptoms do not improve or worsen, to please contact office for sooner follow up or seek emergency care.   I spent 45 minutes of dedicated to the care of this patient on the date of this encounter to include pre-visit review of records, face-to-face time with the patient discussing conditions above, post visit ordering of testing, clinical documentation with the electronic health record, making appropriate referrals as documented, and communicating necessary findings to members of the patients care team.  Comer LULLA Rouleau, NP 05/26/2024  Pt aware and understands NP's role.

## 2024-05-26 NOTE — Telephone Encounter (Signed)
 Will resubmit pa under original jascayd biv encounter

## 2024-05-27 ENCOUNTER — Telehealth: Payer: Self-pay | Admitting: *Deleted

## 2024-05-27 NOTE — Progress Notes (Signed)
 Increase in one of her liver markers compared to prior. We have stopped Ofev . Advise rechecking hepatic function panel in 2 weeks, prior to resuming the Esbriet . Thanks.

## 2024-05-27 NOTE — Telephone Encounter (Signed)
 That's an OTC medication. Not rx.

## 2024-05-27 NOTE — Telephone Encounter (Signed)
 Copied from CRM #[id number]. Topic: Clinical - Prescription Issue >> May 26, 2024  1:07 PM Ismael A wrote: Reason for CRM: patient states she had an appt today with Comer Rouleau and had Doxycycline  and Prednisone  called in, she stated guaifenesin  should be called in as well as that is what they discussed in the visit

## 2024-05-30 ENCOUNTER — Ambulatory Visit: Payer: Self-pay | Admitting: Internal Medicine

## 2024-05-30 NOTE — Telephone Encounter (Signed)
 I recommend she go to the ED for CT chest and further evaluation. She had a pneumonia and if no better, may need admission. Thanks.

## 2024-05-30 NOTE — Telephone Encounter (Signed)
 She is having chest pain but she did have a normal cardiac stress test and September 2025.  Right heart cath only has borderline pulmonary hypertension.  It is not clear to me if she was treated with prednisone  when she called Dorothyann Rouleau but I see Lamarr Cobb's note asking patient to go to the ER if she is not better despite the treatment.  I second this.

## 2024-05-30 NOTE — Telephone Encounter (Signed)
 I called and spoke with patient, she is still taking the prednisone  that Katie prescribed back on 12/4, she is still currently taking the prednisone  and continues to have the pain that is constant from her chest to her back.  I advised her of the recommendations per Dr. Geronimo.  She verbalized understanding.  Nothing further needed.

## 2024-05-30 NOTE — Telephone Encounter (Signed)
 Pt aware NFN

## 2024-05-30 NOTE — Telephone Encounter (Addendum)
 E2C2 Pulmonary Triage - Initial Assessment Questions "Chief Complaint (e.g., cough, sob, wheezing, fever, chills, sweat or additional symptoms) *Go to specific symptom protocol after initial questions. Chest pain - breast around to her back and steroids  "How long have symptoms been present?" Beginning of December  Patient is followed in Pulmonology for pneumonia, last seen on 05/26/2024 by Malachy Comer GAILS, NP.  Called Nurse Triage reporting No chief complaint on file..  ptoms are: Symptoms began a week ago.  Intervention attempted:s  Antibiotics  Symptoms are  unchanged.  Triage Disposition: No disposition on file.  Patient/caregiver understands and will follow disposition?:  yes  Have you tested for COVID or Flu? Note: If not, ask patient if a home test can be taken. If so, instruct patient to call back for positive results. No  MEDICINES:   "Have you used any OTC meds to help with symptoms?" No If yes, ask "What medications?" na  "Have you used your inhalers/maintenance medication?" Yes If yes, "What medications?" nebulizer  If inhaler, ask "How many puffs and how often?" Note: Review instructions on medication in the chart. Is not using  OXYGEN : "Do you wear supplemental oxygen ?" Yes If yes, "How many liters are you supposed to use?" 2 at rest  - 6 with exertion  "Do you monitor your oxygen  levels?" Yes If yes, What is your reading (oxygen  level) today? 60  What is your usual oxygen  saturation reading?  (Note: Pulmonary O2 sats should be 90% or greater) 92                 Copied from CRM #[id number]. Topic: Clinical - Red Word Triage >> May 30, 2024  9:24 AM Corean SAUNDERS wrote: Red Word that prompted transfer to Nurse Triage: Chest pain traveling to back. Reason for Disposition  [1] Chest pain lasts > 5 minutes AND [2] occurred in past 3 days (72 hours) (Exception: Feels exactly the same as previously diagnosed heartburn and has accompanying sour  taste in mouth.)  Answer Assessment - Initial Assessment Questions 1. LOCATION: Where does it hurt?       Right side around to her back 2. RADIATION: Does the pain go anywhere else? (e.g., into neck, jaw, arms, back)     To her back 3. ONSET: When did the chest pain begin? (Minutes, hours or days)      Beginning of December 8. PULMONARY RISK FACTORS: Do you have any history of lung disease?  (e.g., blood clots in lung, asthma, emphysema, birth control pills)     Pt currently has pneumonia 9. CAUSE: What do you think is causing the chest pain?     pneumonia 10. OTHER SYMPTOMS: Do you have any other symptoms? (e.g., dizziness, nausea, vomiting, sweating, fever, difficulty breathing, cough)       Chest pain  Protocols used: Chest Pain-A-AH

## 2024-05-31 ENCOUNTER — Telehealth: Payer: Self-pay

## 2024-05-31 ENCOUNTER — Other Ambulatory Visit: Payer: Self-pay

## 2024-05-31 ENCOUNTER — Emergency Department

## 2024-05-31 ENCOUNTER — Emergency Department: Admission: EM | Admit: 2024-05-31 | Discharge: 2024-05-31 | Disposition: A | Discharge status: Home / Self Care

## 2024-05-31 ENCOUNTER — Encounter

## 2024-05-31 DIAGNOSIS — C3490 Malignant neoplasm of unspecified part of unspecified bronchus or lung: Secondary | ICD-10-CM | POA: Diagnosis not present

## 2024-05-31 DIAGNOSIS — R0789 Other chest pain: Secondary | ICD-10-CM | POA: Diagnosis not present

## 2024-05-31 DIAGNOSIS — J8410 Pulmonary fibrosis, unspecified: Secondary | ICD-10-CM | POA: Diagnosis not present

## 2024-05-31 DIAGNOSIS — R109 Unspecified abdominal pain: Secondary | ICD-10-CM

## 2024-05-31 DIAGNOSIS — R1010 Upper abdominal pain, unspecified: Secondary | ICD-10-CM | POA: Diagnosis not present

## 2024-05-31 DIAGNOSIS — J84112 Idiopathic pulmonary fibrosis: Secondary | ICD-10-CM

## 2024-05-31 DIAGNOSIS — R1011 Right upper quadrant pain: Secondary | ICD-10-CM | POA: Diagnosis not present

## 2024-05-31 DIAGNOSIS — R079 Chest pain, unspecified: Secondary | ICD-10-CM | POA: Diagnosis not present

## 2024-05-31 LAB — CBC
HCT: 40.4 % (ref 36.0–46.0)
Hemoglobin: 13.2 g/dL (ref 12.0–15.0)
MCH: 32.3 pg (ref 26.0–34.0)
MCHC: 32.7 g/dL (ref 30.0–36.0)
MCV: 98.8 fL (ref 80.0–100.0)
Platelets: 705 K/uL — ABNORMAL HIGH (ref 150–400)
RBC: 4.09 MIL/uL (ref 3.87–5.11)
RDW: 14.2 % (ref 11.5–15.5)
WBC: 16.4 K/uL — ABNORMAL HIGH (ref 4.0–10.5)
nRBC: 0 % (ref 0.0–0.2)

## 2024-05-31 LAB — COMPREHENSIVE METABOLIC PANEL WITH GFR
ALT: 12 U/L (ref 0–44)
AST: 19 U/L (ref 15–41)
Albumin: 3.7 g/dL (ref 3.5–5.0)
Alkaline Phosphatase: 173 U/L — ABNORMAL HIGH (ref 38–126)
Anion gap: 16 — ABNORMAL HIGH (ref 5–15)
BUN: 26 mg/dL — ABNORMAL HIGH (ref 6–20)
CO2: 22 mmol/L (ref 22–32)
Calcium: 9.4 mg/dL (ref 8.9–10.3)
Chloride: 102 mmol/L (ref 98–111)
Creatinine, Ser: 0.74 mg/dL (ref 0.44–1.00)
GFR, Estimated: >60 mL/min (ref >60)
Glucose, Bld: 126 mg/dL — ABNORMAL HIGH (ref 70–99)
Potassium: 3.2 mmol/L — ABNORMAL LOW (ref 3.5–5.1)
Sodium: 140 mmol/L (ref 135–145)
Total Bilirubin: <0.2 mg/dL (ref 0.0–1.2)
Total Protein: 6.7 g/dL (ref 6.5–8.1)

## 2024-05-31 LAB — PROCALCITONIN: Procalcitonin: 0.1 ng/mL

## 2024-05-31 LAB — TROPONIN T, HIGH SENSITIVITY
Troponin T High Sensitivity: 17 ng/L (ref 0–19)
Troponin T High Sensitivity: <15 ng/L (ref 0–19)

## 2024-05-31 LAB — MAGNESIUM: Magnesium: 1.8 mg/dL (ref 1.7–2.4)

## 2024-05-31 LAB — LACTIC ACID, PLASMA: Lactic Acid, Venous: 1.1 mmol/L (ref 0.5–1.9)

## 2024-05-31 MED ORDER — MORPHINE SULFATE (PF) 4 MG/ML IV SOLN
4.0000 mg | Freq: Once | INTRAVENOUS | Status: AC
  Administered 2024-05-31: 4 mg via INTRAVENOUS
  Filled 2024-05-31: qty 1

## 2024-05-31 MED ORDER — POTASSIUM CHLORIDE CRYS ER 20 MEQ PO TBCR
40.0000 meq | EXTENDED_RELEASE_TABLET | Freq: Once | ORAL | Status: AC
  Administered 2024-05-31: 40 meq via ORAL
  Filled 2024-05-31: qty 2

## 2024-05-31 MED ORDER — IOHEXOL 300 MG/ML  SOLN
100.0000 mL | Freq: Once | INTRAMUSCULAR | Status: AC | PRN
  Administered 2024-05-31: 100 mL via INTRAVENOUS

## 2024-05-31 NOTE — ED Triage Notes (Signed)
 Pt reports right sided chest pain/RUQ abdominal pain x2 weeks. Pt reports hx of pulmonary fibrosis. Pt was taking a medication for her pumonary fibrosis and the dr took her off of it on 12/4 because they think it was causing an elevation in her LFTs. Pt denies N/V/D at this time. Pt wears 2L via [place] at rest and 6L when ambulating.

## 2024-05-31 NOTE — ED Provider Notes (Signed)
 [facility] Provider Note    Event Date/Time   First MD Initiated Contact with Patient 05/31/24 1501     (approximate)   History   Chest Pain   HPI  Joy Ball is a 58 y.o. female with a past medical history of idiopathic pulmonary fibrosis on 2 L of oxygen  at baseline recently treated with prednisone  for a flare who presents with 2 weeks of worsening right upper quadrant abdominal pain.  Patient states that she has been on antibiotics recently but cannot recall which one.  Reports pain is constant in the right upper quadrant but not associated any nausea vomiting or diarrhea.  She tells me that her overall breathing is not worse than usual.  Denies any dysuria or hematuria or increased urinary frequency.  She presents with her fianc who helps contribute to the history.       Physical Exam   Triage Vital Signs: ED Triage Vitals  Encounter Vitals Group     BP 05/31/24 1247 (!) 106/90     Girls Systolic BP Percentile --      Girls Diastolic BP Percentile --      Boys Systolic BP Percentile --      Boys Diastolic BP Percentile --      Pulse Rate 05/31/24 1247 (!) 110     Resp 05/31/24 1247 18     Temp 05/31/24 1247 97.7 F (36.5 C)     Temp Source 05/31/24 1247 Oral     SpO2 05/31/24 1247 94 %     Weight 05/31/24 1250 109 lb (49.4 kg)     Height 05/31/24 1250 5' (1.524 m)     Head Circumference --      Peak Flow --      Pain Score 05/31/24 1250 7     Pain Loc --      Pain Education --      Exclude from Growth Chart --     Most recent vital signs: Vitals:   05/31/24 1247 05/31/24 1723  BP: (!) 106/90 108/78  Pulse: (!) 110 90  Resp: 18 18  Temp: 97.7 F (36.5 C) 98.1 F (36.7 C)  SpO2: 94% 96%    Nursing Triage Note reviewed. Vital signs reviewed and patients oxygen  saturation is normoxic on home oxygen   General: Patient is well nourished, well developed, awake and alert, resting comfortably in no acute distress Head:  Normocephalic and atraumatic Eyes: Normal inspection, extraocular muscles intact, no conjunctival pallor Ear, nose, throat: Normal external exam Neck: Normal range of motion Respiratory: Patient is in no respiratory distress, lungs mild wheezes throughout Cardiovascular: Patient is tachycardic, RR without murmur appreciated GI: Abd soft, mild tenderness to palpation in the right upper quadrant with no guarding or rebound  Back: Normal inspection of the back with good strength and range of motion throughout all ext, no CVA tenderness to palpation Extremities: pulses intact with good cap refills, no LE pitting edema or calf tenderness Neuro: The patient is alert and oriented to person, place, and time, appropriately conversive, with 5/5 bilat UE/LE strength, no gross motor or sensory defects noted. Coordination appears to be adequate. Skin: Warm, dry, and intact Psych: normal mood and affect, no SI or HI  ED Results / Procedures / Treatments   Labs (all labs ordered are listed, but only abnormal results are displayed) Labs Reviewed  CBC - Abnormal; Notable for the following components:      Result Value   WBC 16.4 (*)  Platelets 705 (*)    All other components within normal limits  COMPREHENSIVE METABOLIC PANEL WITH GFR - Abnormal; Notable for the following components:   Potassium 3.2 (*)    Glucose, Bld 126 (*)    BUN 26 (*)    Alkaline Phosphatase 173 (*)    Anion gap 16 (*)    All other components within normal limits  MAGNESIUM  LACTIC ACID, PLASMA  PROCALCITONIN  TROPONIN T, HIGH SENSITIVITY  TROPONIN T, HIGH SENSITIVITY     EKG EKG and rhythm strip are interpreted by myself:   EKG: [tachycardic sinus rhythm] at heart rate of 113, normal QRS duration, QTc 647 on readout however on my independent review interpretation looks closer to 490, nonspecific ST segments and T waves no ectopy EKG not consistent with Acute STEMI Rhythm strip: Tachycardic sinus rhythm in lead II  in lead II   RADIOLOGY X-ray chest: Consistent with pulmonary fibrosis on my independent review interpretation and radiologist reads it similarly without evidence of pneumonia CT abdomen pelvis with IV contrast: No acute abnormality on my independent review interpretation radiologist agrees    PROCEDURES:  Critical Care performed: No  Procedures   MEDICATIONS ORDERED IN ED: Medications  potassium chloride  SA (KLOR-CON  M) CR tablet 40 mEq (40 mEq Oral Given 05/31/24 1543)  morphine  (PF) 4 MG/ML injection 4 mg (4 mg Intravenous Given 05/31/24 1542)  iohexol  (OMNIPAQUE ) 300 MG/ML solution 100 mL (100 mLs Intravenous Contrast Given 05/31/24 1600)     IMPRESSION / MDM / ASSESSMENT AND PLAN / ED COURSE                                Differential diagnosis includes, but is not limited to, cholecystitis, atypical appendicitis, colitis, diverticulitis, atypical ACS, worsening pneumonia, pleuritis  ED course: Patient is well-appearing and abdominal exam demonstrates no evidence of peritonitis.  She was tachycardic on presentation however she had just ambulated and was only wearing 2 L of nasal cannula when her baseline while ambulating is 6 L.  EKG demonstrated no evidence of acute ACS and troponin was not elevated despite 2 weeks of symptoms.  Chest x-ray demonstrated no pneumonia.  She did have a slight leukocytosis of 16.4 however this may be secondary to her recent course of prednisone .  Lactic acid was 1.1 and she did not have an elevated procalcitonin.  Potassium was borderline low at 3.2 and this was repleted.  CT abdomen pelvis demonstrated no acute abnormalities and incidental findings were reviewed with the patient.  Patient felt reassured and felt comfortable returning home   Clinical Course as of 05/31/24 2338  Tue May 31, 2024  1711 With the patient and her significant other I counseled them on the results and printed out the CT results and explained the findings.  She already has  a cardiologist that she follows regularly with.  She feels comfortable returning home.  Expressed the need for room humidification.  All questions answered patient voiced understanding and requested discharge [HD]    Clinical Course User Index [HD] Nicholaus Rolland BRAVO, MD   At time of discharge there is no evidence of acute life, limb, vision, or fertility threat. Patient has stable vital signs, pain is well controlled, patient is ambulatory and p.o. tolerant.  Discharge instructions were completed using the EPIC system. I would refer you to those at this time. All warnings prescriptions follow-up etc. were discussed in detail with the patient.  Patient indicates understanding and is agreeable with this plan. All questions answered.  Patient is made aware that they may return to the emergency department for any worsening or new condition or for any other emergency.   -- Risk: 5 This patient has a high risk of morbidity due to further diagnostic testing or treatment. Rationale: This patient's evaluation and management involve a high risk of morbidity due to the potential severity of presenting symptoms, need for diagnostic testing, and/or initiation of treatment that may require close monitoring. The differential includes conditions with potential for significant deterioration or requiring escalation of care. Treatment decisions in the ED, including medication administration, procedural interventions, or disposition planning, reflect this level of risk. COPA: 5 The patient has the following acute or chronic illness/injury that poses a possible threat to life or bodily function: [X] : The patient has a potentially serious acute condition or an acute exacerbation of a chronic illness requiring urgent evaluation and management in the Emergency Department. The clinical presentation necessitates immediate consideration of life-threatening or function-threatening diagnoses, even if they are ultimately ruled  out.   FINAL CLINICAL IMPRESSION(S) / ED DIAGNOSES   Final diagnoses:  Right sided abdominal pain  Pulmonary fibrosis (HCC)     Rx / DC Orders   ED Discharge Orders     None        Note:  This document was prepared using Dragon voice recognition software and may include unintentional dictation errors.   Nicholaus Rolland BRAVO, MD 05/31/24 [phone]

## 2024-05-31 NOTE — Addendum Note (Signed)
 Addended by: CLAUDENE NEVINS A on: 05/31/2024 10:12 AM   Modules accepted: Orders

## 2024-05-31 NOTE — Discharge Instructions (Signed)
 Room humidification, follow-up with your primary care physician and cardiologist   RETURN PRECAUTIONS & AFTERCARE: (ENGLISH) RETURN PRECAUTIONS: Return immediately to the emergency department or see/call your doctor if you feel worse, weak or have changes in speech or vision, are short of breath, have fever, vomiting, pain, bleeding or dark stool, trouble urinating or any new issues. Return here or see/call your doctor if not improving as expected for your suspected condition. FOLLOW-UP CARE: Call your doctor and/or any doctors we referred you to for more advice and to make an appointment. Do this today, tomorrow or after the weekend. Some doctors only take PPO insurance so if you have HMO insurance you may want to contact your HMO or your regular doctor for referral to a specialist within your plan. Either way tell the doctor's office that it was a referral from the emergency department so you get the soonest possible appointment.  YOUR TEST RESULTS: Take result reports of any blood or urine tests, imaging tests and EKG's to your doctor and any referral doctor. Have any abnormal tests repeated. Your doctor or a referral doctor can let you know when this should be done. Also make sure your doctor contacts this hospital to get any test results that are not currently available such as cultures or special tests for infection and final imaging reports, which are often not available at the time you leave the ER but which may list additional important findings that are not documented on the preliminary report. BLOOD PRESSURE: If your blood pressure was greater than 120/80 have your blood pressure rechecked within 1 to 2 weeks. MEDICATION SIDE EFFECTS: Do not drive, walk, bike, take the bus, etc. if you have received or are being prescribed any sedating medications such as those for pain or anxiety or certain antihistamines like Benadryl. If you have been give one of these here get a taxi home or have a friend  drive you home. Ask your pharmacist to counsel you on potential side effects of any new medication

## 2024-05-31 NOTE — Telephone Encounter (Signed)
 Called and spoke to pt - advised of Mallie Cobb's recommendation to go to the ED. Pt states that she is already at the ED. NFN.

## 2024-05-31 NOTE — ED Notes (Signed)
 Patient to CT via wheelchair.

## 2024-06-02 ENCOUNTER — Ambulatory Visit: Attending: Cardiology | Admitting: Cardiology

## 2024-06-02 NOTE — Telephone Encounter (Signed)
 PharmD to submit appeal for [name]

## 2024-06-02 NOTE — Telephone Encounter (Signed)
 Submitted an URGENT appeal to [facility] MEDICAID for Joy Ball.   Fax [phone]  Will await response.

## 2024-06-03 ENCOUNTER — Encounter: Payer: Self-pay | Admitting: Cardiology

## 2024-06-03 NOTE — Telephone Encounter (Signed)
 Best to go with Joy Ball

## 2024-06-03 NOTE — Telephone Encounter (Signed)
 Pharmacy team submitted urgent appeal for [name] - updates to follow in [facility]) thread dated 04/08/24.

## 2024-06-09 ENCOUNTER — Inpatient Hospital Stay
Admission: EM | Admit: 2024-06-09 | Discharge: 2024-06-15 | DRG: 193 | Disposition: A | Discharge status: Home Health | Attending: Internal Medicine | Admitting: Internal Medicine

## 2024-06-09 ENCOUNTER — Emergency Department

## 2024-06-09 ENCOUNTER — Other Ambulatory Visit: Payer: Self-pay

## 2024-06-09 DIAGNOSIS — J168 Pneumonia due to other specified infectious organisms: Secondary | ICD-10-CM | POA: Diagnosis not present

## 2024-06-09 DIAGNOSIS — J988 Other specified respiratory disorders: Secondary | ICD-10-CM

## 2024-06-09 DIAGNOSIS — Z87891 Personal history of nicotine dependence: Secondary | ICD-10-CM

## 2024-06-09 DIAGNOSIS — J439 Emphysema, unspecified: Secondary | ICD-10-CM | POA: Diagnosis present

## 2024-06-09 DIAGNOSIS — R069 Unspecified abnormalities of breathing: Secondary | ICD-10-CM | POA: Diagnosis not present

## 2024-06-09 DIAGNOSIS — R918 Other nonspecific abnormal finding of lung field: Secondary | ICD-10-CM | POA: Diagnosis not present

## 2024-06-09 DIAGNOSIS — Z86711 Personal history of pulmonary embolism: Secondary | ICD-10-CM

## 2024-06-09 DIAGNOSIS — Z7982 Long term (current) use of aspirin: Secondary | ICD-10-CM

## 2024-06-09 DIAGNOSIS — J8410 Pulmonary fibrosis, unspecified: Secondary | ICD-10-CM | POA: Diagnosis present

## 2024-06-09 DIAGNOSIS — Z8249 Family history of ischemic heart disease and other diseases of the circulatory system: Secondary | ICD-10-CM

## 2024-06-09 DIAGNOSIS — I2510 Atherosclerotic heart disease of native coronary artery without angina pectoris: Secondary | ICD-10-CM | POA: Diagnosis present

## 2024-06-09 DIAGNOSIS — R0989 Other specified symptoms and signs involving the circulatory and respiratory systems: Secondary | ICD-10-CM | POA: Diagnosis not present

## 2024-06-09 DIAGNOSIS — J9601 Acute respiratory failure with hypoxia: Secondary | ICD-10-CM | POA: Diagnosis not present

## 2024-06-09 DIAGNOSIS — Z1152 Encounter for screening for COVID-19: Secondary | ICD-10-CM

## 2024-06-09 DIAGNOSIS — Z9981 Dependence on supplemental oxygen: Secondary | ICD-10-CM

## 2024-06-09 DIAGNOSIS — E876 Hypokalemia: Secondary | ICD-10-CM | POA: Diagnosis present

## 2024-06-09 DIAGNOSIS — Z7951 Long term (current) use of inhaled steroids: Secondary | ICD-10-CM

## 2024-06-09 DIAGNOSIS — Z8261 Family history of arthritis: Secondary | ICD-10-CM

## 2024-06-09 DIAGNOSIS — I509 Heart failure, unspecified: Secondary | ICD-10-CM | POA: Diagnosis present

## 2024-06-09 DIAGNOSIS — K219 Gastro-esophageal reflux disease without esophagitis: Secondary | ICD-10-CM | POA: Diagnosis present

## 2024-06-09 DIAGNOSIS — R0602 Shortness of breath: Secondary | ICD-10-CM

## 2024-06-09 DIAGNOSIS — Z825 Family history of asthma and other chronic lower respiratory diseases: Secondary | ICD-10-CM

## 2024-06-09 DIAGNOSIS — Z66 Do not resuscitate: Secondary | ICD-10-CM | POA: Diagnosis not present

## 2024-06-09 DIAGNOSIS — Z821 Family history of blindness and visual loss: Secondary | ICD-10-CM

## 2024-06-09 DIAGNOSIS — J441 Chronic obstructive pulmonary disease with (acute) exacerbation: Secondary | ICD-10-CM | POA: Diagnosis not present

## 2024-06-09 DIAGNOSIS — F39 Unspecified mood [affective] disorder: Secondary | ICD-10-CM | POA: Diagnosis present

## 2024-06-09 DIAGNOSIS — J189 Pneumonia, unspecified organism: Principal | ICD-10-CM | POA: Diagnosis present

## 2024-06-09 DIAGNOSIS — R739 Hyperglycemia, unspecified: Secondary | ICD-10-CM | POA: Diagnosis not present

## 2024-06-09 DIAGNOSIS — Z801 Family history of malignant neoplasm of trachea, bronchus and lung: Secondary | ICD-10-CM

## 2024-06-09 DIAGNOSIS — Z923 Personal history of irradiation: Secondary | ICD-10-CM

## 2024-06-09 DIAGNOSIS — T380X5A Adverse effect of glucocorticoids and synthetic analogues, initial encounter: Secondary | ICD-10-CM | POA: Diagnosis not present

## 2024-06-09 DIAGNOSIS — Z79899 Other long term (current) drug therapy: Secondary | ICD-10-CM

## 2024-06-09 DIAGNOSIS — J810 Acute pulmonary edema: Secondary | ICD-10-CM

## 2024-06-09 DIAGNOSIS — Z85118 Personal history of other malignant neoplasm of bronchus and lung: Secondary | ICD-10-CM

## 2024-06-09 DIAGNOSIS — Z743 Need for continuous supervision: Secondary | ICD-10-CM | POA: Diagnosis not present

## 2024-06-09 DIAGNOSIS — J440 Chronic obstructive pulmonary disease with acute lower respiratory infection: Secondary | ICD-10-CM | POA: Diagnosis present

## 2024-06-09 DIAGNOSIS — E873 Alkalosis: Secondary | ICD-10-CM | POA: Diagnosis present

## 2024-06-09 DIAGNOSIS — J9621 Acute and chronic respiratory failure with hypoxia: Principal | ICD-10-CM | POA: Diagnosis present

## 2024-06-09 DIAGNOSIS — M1990 Unspecified osteoarthritis, unspecified site: Secondary | ICD-10-CM | POA: Diagnosis present

## 2024-06-09 DIAGNOSIS — I110 Hypertensive heart disease with heart failure: Secondary | ICD-10-CM | POA: Diagnosis present

## 2024-06-09 LAB — CBC WITH DIFFERENTIAL/PLATELET
Abs Immature Granulocytes: 0.12 K/uL — ABNORMAL HIGH (ref 0.00–0.07)
Basophils Absolute: 0 K/uL (ref 0.0–0.1)
Basophils Relative: 0 %
Eosinophils Absolute: 0.1 K/uL (ref 0.0–0.5)
Eosinophils Relative: 1 %
HCT: 36.5 % (ref 36.0–46.0)
Hemoglobin: 11.8 g/dL — ABNORMAL LOW (ref 12.0–15.0)
Immature Granulocytes: 1 %
Lymphocytes Relative: 13 %
Lymphs Abs: 2.5 K/uL (ref 0.7–4.0)
MCH: 31.8 pg (ref 26.0–34.0)
MCHC: 32.3 g/dL (ref 30.0–36.0)
MCV: 98.4 fL (ref 80.0–100.0)
Monocytes Absolute: 1.1 K/uL — ABNORMAL HIGH (ref 0.1–1.0)
Monocytes Relative: 6 %
Neutro Abs: 15.3 K/uL — ABNORMAL HIGH (ref 1.7–7.7)
Neutrophils Relative %: 79 %
Platelets: 495 K/uL — ABNORMAL HIGH (ref 150–400)
RBC: 3.71 MIL/uL — ABNORMAL LOW (ref 3.87–5.11)
RDW: 14.4 % (ref 11.5–15.5)
WBC: 19.1 K/uL — ABNORMAL HIGH (ref 4.0–10.5)
nRBC: 0 % (ref 0.0–0.2)

## 2024-06-09 MED ORDER — BUDESONIDE 0.5 MG/2ML IN SUSP
1.0000 mg | Freq: Once | RESPIRATORY_TRACT | Status: AC
  Administered 2024-06-10: 1 mg via RESPIRATORY_TRACT
  Filled 2024-06-09: qty 4

## 2024-06-09 MED ORDER — IPRATROPIUM-ALBUTEROL 0.5-2.5 (3) MG/3ML IN SOLN
9.0000 mL | Freq: Once | RESPIRATORY_TRACT | Status: AC
  Administered 2024-06-10: 9 mL via RESPIRATORY_TRACT
  Filled 2024-06-09: qty 9

## 2024-06-09 MED ORDER — SODIUM CHLORIDE 0.9 % IV SOLN
500.0000 mg | Freq: Once | INTRAVENOUS | Status: AC
  Administered 2024-06-10: 500 mg via INTRAVENOUS
  Filled 2024-06-09: qty 5

## 2024-06-09 MED ORDER — SODIUM CHLORIDE 0.9 % IV BOLUS
1000.0000 mL | Freq: Once | INTRAVENOUS | Status: AC
  Administered 2024-06-10: 1000 mL via INTRAVENOUS

## 2024-06-09 MED ORDER — SODIUM CHLORIDE 0.9 % IV SOLN
1.0000 g | Freq: Once | INTRAVENOUS | Status: AC
  Administered 2024-06-10: 1 g via INTRAVENOUS
  Filled 2024-06-09: qty 10

## 2024-06-09 NOTE — ED Triage Notes (Signed)
 Pt to ED BIB EMS with c/o shortness of breath. Pt with hx of pulmonary fibrosis, asthma, COPD and lung cancer. Reports 2L [place] at baseline and having to increase to 6L on exertion. Pt states for the last 2 days she has become increasingly more short of breath. O2 sats dropping into the 60s on exertion. Recently finished antibiotics for pneumonia, denies fevers, denies sick contacts. EMS gave breathing treatment and 125mg  solumedrol IM.

## 2024-06-09 NOTE — ED Provider Notes (Incomplete)
 [facility] Provider Note    Event Date/Time   First MD Initiated Contact with Patient 06/09/24 2309     (approximate)   History   Shortness of Breath   HPI  Joy Ball is a 58 y.o. female   Past medical history of ***    Independent Historian contributed to assessment above: ***  External Medical Documents Reviewed: ***      Physical Exam   Triage Vital Signs: ED Triage Vitals  Encounter Vitals Group     BP 06/09/24 2249 135/81     Girls Systolic BP Percentile --      Girls Diastolic BP Percentile --      Boys Systolic BP Percentile --      Boys Diastolic BP Percentile --      Pulse Rate 06/09/24 2249 92     Resp 06/09/24 2249 (!) 28     Temp 06/09/24 2249 98.2 F (36.8 C)     Temp Source 06/09/24 2249 Oral     SpO2 06/09/24 2249 95 %     Weight --      Height 06/09/24 2252 5' (1.524 m)     Head Circumference --      Peak Flow --      Pain Score --      Pain Loc --      Pain Education --      Exclude from Growth Chart --     Most recent vital signs: Vitals:   06/09/24 2249  BP: 135/81  Pulse: 92  Resp: (!) 28  Temp: 98.2 F (36.8 C)  SpO2: 95%    General: Awake, no distress. *** CV:  Good peripheral perfusion. *** Resp:  Normal effort. *** Abd:  No distention. *** Other:  ***   ED Results / Procedures / Treatments   Labs (all labs ordered are listed, but only abnormal results are displayed) Labs Reviewed  RESP PANEL BY RT-PCR (RSV, FLU A&B, COVID)  [id number]  COMPREHENSIVE METABOLIC PANEL WITH GFR  CBC WITH DIFFERENTIAL/PLATELET  TROPONIN T, HIGH SENSITIVITY     I ordered and reviewed the above labs they are notable for ***  EKG  ED ECG REPORT I, Ginnie Shams, the attending physician, personally viewed and interpreted this ECG.   Date: 06/09/2024  EKG Time: ***  Rate: ***  Rhythm: {ekg findings:315101}  Axis: ***  Intervals:{conduction defects:17367}  ST&T Change: ***    RADIOLOGY I  independently reviewed and interpreted *** I also reviewed radiologist's formal read.   PROCEDURES:  Critical Care performed: {CriticalCareYesNo:19197::Yes, see critical care procedure note(s),No}  Procedures   MEDICATIONS ORDERED IN ED: Medications  ipratropium-albuterol  (DUONEB) 0.5-2.5 (3) MG/3ML nebulizer solution 9 mL (has no administration in time range)  budesonide  (PULMICORT ) nebulizer solution 1 mg (has no administration in time range)    External physician / consultants:  I spoke with *** regarding care plan for this patient.   IMPRESSION / MDM / ASSESSMENT AND PLAN / ED COURSE  I reviewed the triage vital signs and the nursing notes.                                Patient's presentation is most consistent with {EM COPA:27473}  Differential diagnosis includes, but is not limited to, ***   ***The patient is on the cardiac monitor to evaluate for evidence of arrhythmia and/or significant heart rate changes.  MDM:  ***  I considered hospitalization for admission or observation ***        FINAL CLINICAL IMPRESSION(S) / ED DIAGNOSES   Final diagnoses:  None     Rx / DC Orders   ED Discharge Orders     None        Note:  This document was prepared using Dragon voice recognition software and may include unintentional dictation errors.

## 2024-06-09 NOTE — Telephone Encounter (Signed)
 Received fax from [facility] MEDICAID that appeal for Leverette was APPROVED.

## 2024-06-10 ENCOUNTER — Emergency Department

## 2024-06-10 DIAGNOSIS — J988 Other specified respiratory disorders: Secondary | ICD-10-CM | POA: Diagnosis not present

## 2024-06-10 DIAGNOSIS — Z8249 Family history of ischemic heart disease and other diseases of the circulatory system: Secondary | ICD-10-CM | POA: Diagnosis not present

## 2024-06-10 DIAGNOSIS — Z9981 Dependence on supplemental oxygen: Secondary | ICD-10-CM | POA: Diagnosis not present

## 2024-06-10 DIAGNOSIS — I509 Heart failure, unspecified: Secondary | ICD-10-CM | POA: Diagnosis present

## 2024-06-10 DIAGNOSIS — R0602 Shortness of breath: Secondary | ICD-10-CM | POA: Diagnosis present

## 2024-06-10 DIAGNOSIS — I110 Hypertensive heart disease with heart failure: Secondary | ICD-10-CM | POA: Diagnosis present

## 2024-06-10 DIAGNOSIS — J9621 Acute and chronic respiratory failure with hypoxia: Secondary | ICD-10-CM

## 2024-06-10 DIAGNOSIS — J439 Emphysema, unspecified: Secondary | ICD-10-CM | POA: Diagnosis present

## 2024-06-10 DIAGNOSIS — J441 Chronic obstructive pulmonary disease with (acute) exacerbation: Secondary | ICD-10-CM

## 2024-06-10 DIAGNOSIS — M1990 Unspecified osteoarthritis, unspecified site: Secondary | ICD-10-CM | POA: Diagnosis present

## 2024-06-10 DIAGNOSIS — Z801 Family history of malignant neoplasm of trachea, bronchus and lung: Secondary | ICD-10-CM | POA: Diagnosis not present

## 2024-06-10 DIAGNOSIS — J8410 Pulmonary fibrosis, unspecified: Secondary | ICD-10-CM | POA: Diagnosis present

## 2024-06-10 DIAGNOSIS — Z66 Do not resuscitate: Secondary | ICD-10-CM | POA: Diagnosis not present

## 2024-06-10 DIAGNOSIS — I700 Atherosclerosis of aorta: Secondary | ICD-10-CM | POA: Diagnosis not present

## 2024-06-10 DIAGNOSIS — Z1152 Encounter for screening for COVID-19: Secondary | ICD-10-CM | POA: Diagnosis not present

## 2024-06-10 DIAGNOSIS — J849 Interstitial pulmonary disease, unspecified: Secondary | ICD-10-CM | POA: Diagnosis not present

## 2024-06-10 DIAGNOSIS — F39 Unspecified mood [affective] disorder: Secondary | ICD-10-CM | POA: Diagnosis present

## 2024-06-10 DIAGNOSIS — J810 Acute pulmonary edema: Secondary | ICD-10-CM | POA: Diagnosis not present

## 2024-06-10 DIAGNOSIS — J449 Chronic obstructive pulmonary disease, unspecified: Secondary | ICD-10-CM | POA: Diagnosis not present

## 2024-06-10 DIAGNOSIS — Z789 Other specified health status: Secondary | ICD-10-CM | POA: Diagnosis not present

## 2024-06-10 DIAGNOSIS — I2510 Atherosclerotic heart disease of native coronary artery without angina pectoris: Secondary | ICD-10-CM | POA: Diagnosis present

## 2024-06-10 DIAGNOSIS — C3490 Malignant neoplasm of unspecified part of unspecified bronchus or lung: Secondary | ICD-10-CM | POA: Diagnosis not present

## 2024-06-10 DIAGNOSIS — T380X5A Adverse effect of glucocorticoids and synthetic analogues, initial encounter: Secondary | ICD-10-CM | POA: Diagnosis not present

## 2024-06-10 DIAGNOSIS — J440 Chronic obstructive pulmonary disease with acute lower respiratory infection: Secondary | ICD-10-CM | POA: Diagnosis present

## 2024-06-10 DIAGNOSIS — J84112 Idiopathic pulmonary fibrosis: Secondary | ICD-10-CM | POA: Diagnosis not present

## 2024-06-10 DIAGNOSIS — Z7189 Other specified counseling: Secondary | ICD-10-CM | POA: Diagnosis not present

## 2024-06-10 DIAGNOSIS — E873 Alkalosis: Secondary | ICD-10-CM | POA: Diagnosis present

## 2024-06-10 DIAGNOSIS — Z923 Personal history of irradiation: Secondary | ICD-10-CM | POA: Diagnosis not present

## 2024-06-10 DIAGNOSIS — Z515 Encounter for palliative care: Secondary | ICD-10-CM | POA: Diagnosis not present

## 2024-06-10 DIAGNOSIS — Z7951 Long term (current) use of inhaled steroids: Secondary | ICD-10-CM | POA: Diagnosis not present

## 2024-06-10 DIAGNOSIS — Z85118 Personal history of other malignant neoplasm of bronchus and lung: Secondary | ICD-10-CM | POA: Diagnosis not present

## 2024-06-10 DIAGNOSIS — Z87891 Personal history of nicotine dependence: Secondary | ICD-10-CM | POA: Diagnosis not present

## 2024-06-10 DIAGNOSIS — E876 Hypokalemia: Secondary | ICD-10-CM | POA: Diagnosis present

## 2024-06-10 DIAGNOSIS — J189 Pneumonia, unspecified organism: Secondary | ICD-10-CM | POA: Diagnosis present

## 2024-06-10 DIAGNOSIS — Z7982 Long term (current) use of aspirin: Secondary | ICD-10-CM | POA: Diagnosis not present

## 2024-06-10 DIAGNOSIS — J9601 Acute respiratory failure with hypoxia: Secondary | ICD-10-CM | POA: Diagnosis not present

## 2024-06-10 LAB — COMPREHENSIVE METABOLIC PANEL WITH GFR
ALT: 9 U/L (ref 0–44)
ALT: 15 U/L (ref 0–44)
AST: 23 U/L (ref 15–41)
AST: 31 U/L (ref 15–41)
Albumin: 3.5 g/dL (ref 3.5–5.0)
Albumin: 3.8 g/dL (ref 3.5–5.0)
Alkaline Phosphatase: 171 U/L — ABNORMAL HIGH (ref 38–126)
Alkaline Phosphatase: 185 U/L — ABNORMAL HIGH (ref 38–126)
Anion gap: 10 (ref 5–15)
Anion gap: 17 — ABNORMAL HIGH (ref 5–15)
BUN: 24 mg/dL — ABNORMAL HIGH (ref 6–20)
BUN: 16 mg/dL (ref 6–20)
CO2: 25 mmol/L (ref 22–32)
CO2: 21 mmol/L — ABNORMAL LOW (ref 22–32)
Calcium: 8.8 mg/dL — ABNORMAL LOW (ref 8.9–10.3)
Calcium: 8.7 mg/dL — ABNORMAL LOW (ref 8.9–10.3)
Chloride: 105 mmol/L (ref 98–111)
Chloride: 100 mmol/L (ref 98–111)
Creatinine, Ser: 0.56 mg/dL (ref 0.44–1.00)
Creatinine, Ser: 0.72 mg/dL (ref 0.44–1.00)
GFR, Estimated: >60 mL/min (ref >60)
GFR, Estimated: >60 mL/min
Glucose, Bld: 99 mg/dL (ref 70–99)
Glucose, Bld: 255 mg/dL — ABNORMAL HIGH (ref 70–99)
Potassium: 3.4 mmol/L — ABNORMAL LOW (ref 3.5–5.1)
Potassium: 3.3 mmol/L — ABNORMAL LOW (ref 3.5–5.1)
Sodium: 140 mmol/L (ref 135–145)
Sodium: 139 mmol/L (ref 135–145)
Total Bilirubin: 0.3 mg/dL (ref 0.0–1.2)
Total Bilirubin: 0.3 mg/dL (ref 0.0–1.2)
Total Protein: 6.2 g/dL — ABNORMAL LOW (ref 6.5–8.1)
Total Protein: 7.3 g/dL (ref 6.5–8.1)

## 2024-06-10 LAB — SEDIMENTATION RATE: Sed Rate: 37 mm/h — ABNORMAL HIGH (ref 0–30)

## 2024-06-10 LAB — RESPIRATORY PANEL BY PCR

## 2024-06-10 LAB — CBC
HCT: 37.2 % (ref 36.0–46.0)
Hemoglobin: 12.1 g/dL (ref 12.0–15.0)
MCH: 31.8 pg (ref 26.0–34.0)
MCHC: 32.5 g/dL (ref 30.0–36.0)
MCV: 97.6 fL (ref 80.0–100.0)
Platelets: 445 K/uL — ABNORMAL HIGH (ref 150–400)
RBC: 3.81 MIL/uL — ABNORMAL LOW (ref 3.87–5.11)
RDW: 14.4 % (ref 11.5–15.5)
WBC: 16.6 K/uL — ABNORMAL HIGH (ref 4.0–10.5)
nRBC: 0 % (ref 0.0–0.2)

## 2024-06-10 LAB — HEMOGLOBIN A1C
Hgb A1c MFr Bld: 5.1 % (ref 4.8–5.6)
Mean Plasma Glucose: 99.67 mg/dL

## 2024-06-10 LAB — TROPONIN T, HIGH SENSITIVITY
Troponin T High Sensitivity: 18 ng/L (ref 0–19)
Troponin T High Sensitivity: <15 ng/L (ref 0–19)

## 2024-06-10 LAB — CBG MONITORING, ED: Glucose-Capillary: 112 mg/dL — ABNORMAL HIGH (ref 70–99)

## 2024-06-10 LAB — C-REACTIVE PROTEIN: CRP: 14.9 mg/dL — ABNORMAL HIGH

## 2024-06-10 LAB — RESP PANEL BY RT-PCR (RSV, FLU A&B, COVID)  RVPGX2
Influenza A by PCR: NEGATIVE
Influenza B by PCR: NEGATIVE
Resp Syncytial Virus by PCR: NEGATIVE
SARS Coronavirus 2 by RT PCR: NEGATIVE

## 2024-06-10 LAB — PRO BRAIN NATRIURETIC PEPTIDE: Pro Brain Natriuretic Peptide: 498 pg/mL — ABNORMAL HIGH

## 2024-06-10 LAB — D-DIMER, QUANTITATIVE: D-Dimer, Quant: 0.63 ug{FEU}/mL — ABNORMAL HIGH (ref 0.00–0.50)

## 2024-06-10 LAB — MAGNESIUM: Magnesium: 2.4 mg/dL (ref 1.7–2.4)

## 2024-06-10 LAB — GLUCOSE, CAPILLARY
Glucose-Capillary: 126 mg/dL — ABNORMAL HIGH (ref 70–99)
Glucose-Capillary: 131 mg/dL — ABNORMAL HIGH (ref 70–99)

## 2024-06-10 LAB — MRSA NEXT GEN BY PCR, NASAL: MRSA by PCR Next Gen: NOT DETECTED

## 2024-06-10 MED ORDER — IOHEXOL 350 MG/ML SOLN
75.0000 mL | Freq: Once | INTRAVENOUS | Status: AC | PRN
  Administered 2024-06-10: 75 mL via INTRAVENOUS

## 2024-06-10 MED ORDER — GUAIFENESIN-DM 100-10 MG/5ML PO SYRP
10.0000 mL | ORAL_SOLUTION | ORAL | Status: DC | PRN
  Administered 2024-06-10 – 2024-06-12 (×2): 10 mL via ORAL
  Filled 2024-06-10 (×2): qty 10

## 2024-06-10 MED ORDER — METHYLPREDNISOLONE SODIUM SUCC 40 MG IJ SOLR
40.0000 mg | Freq: Two times a day (BID) | INTRAMUSCULAR | Status: AC
  Administered 2024-06-10 (×2): 40 mg via INTRAVENOUS
  Filled 2024-06-10 (×2): qty 1

## 2024-06-10 MED ORDER — ONDANSETRON HCL 4 MG PO TABS: 4.0000 mg | ORAL_TABLET | Freq: Four times a day (QID) | ORAL | Status: DC | PRN

## 2024-06-10 MED ORDER — INSULIN ASPART 100 UNIT/ML IJ SOLN: 0.0000 [IU] | Freq: Every day | INTRAMUSCULAR | Status: DC

## 2024-06-10 MED ORDER — SODIUM CHLORIDE 0.9 % IV SOLN
1.0000 g | Freq: Once | INTRAVENOUS | Status: AC
  Administered 2024-06-10: 1 g via INTRAVENOUS
  Filled 2024-06-10: qty 10

## 2024-06-10 MED ORDER — BUPROPION HCL ER (SR) 150 MG PO TB12
150.0000 mg | ORAL_TABLET | Freq: Two times a day (BID) | ORAL | Status: DC
  Administered 2024-06-10 – 2024-06-15 (×11): 150 mg via ORAL
  Filled 2024-06-10 (×11): qty 1

## 2024-06-10 MED ORDER — ALBUTEROL SULFATE (2.5 MG/3ML) 0.083% IN NEBU: 2.5000 mg | INHALATION_SOLUTION | RESPIRATORY_TRACT | Status: DC | PRN

## 2024-06-10 MED ORDER — SODIUM CHLORIDE 0.9 % IV SOLN
2.0000 g | INTRAVENOUS | Status: AC
  Administered 2024-06-11 – 2024-06-14 (×4): 2 g via INTRAVENOUS
  Filled 2024-06-10 (×4): qty 20

## 2024-06-10 MED ORDER — ACETAMINOPHEN 325 MG PO TABS
650.0000 mg | ORAL_TABLET | Freq: Four times a day (QID) | ORAL | Status: DC | PRN
  Administered 2024-06-15: 650 mg via ORAL
  Filled 2024-06-10: qty 2

## 2024-06-10 MED ORDER — IPRATROPIUM-ALBUTEROL 0.5-2.5 (3) MG/3ML IN SOLN
3.0000 mL | RESPIRATORY_TRACT | Status: DC
  Administered 2024-06-10: 3 mL via RESPIRATORY_TRACT
  Filled 2024-06-10: qty 3

## 2024-06-10 MED ORDER — FUROSEMIDE 10 MG/ML IJ SOLN
20.0000 mg | Freq: Two times a day (BID) | INTRAMUSCULAR | Status: DC
  Administered 2024-06-10 – 2024-06-11 (×3): 20 mg via INTRAVENOUS
  Filled 2024-06-10: qty 2
  Filled 2024-06-10: qty 4
  Filled 2024-06-10: qty 2

## 2024-06-10 MED ORDER — BUDESONIDE 0.5 MG/2ML IN SUSP
0.5000 mg | Freq: Two times a day (BID) | RESPIRATORY_TRACT | Status: DC
  Administered 2024-06-10 – 2024-06-12 (×4): 0.5 mg via RESPIRATORY_TRACT
  Filled 2024-06-10 (×4): qty 2

## 2024-06-10 MED ORDER — POTASSIUM CHLORIDE CRYS ER 20 MEQ PO TBCR
60.0000 meq | EXTENDED_RELEASE_TABLET | Freq: Once | ORAL | Status: AC
  Administered 2024-06-10: 60 meq via ORAL
  Filled 2024-06-10: qty 3

## 2024-06-10 MED ORDER — FUROSEMIDE 10 MG/ML IJ SOLN
40.0000 mg | Freq: Once | INTRAMUSCULAR | Status: AC
  Administered 2024-06-10: 40 mg via INTRAVENOUS
  Filled 2024-06-10: qty 4

## 2024-06-10 MED ORDER — IPRATROPIUM-ALBUTEROL 0.5-2.5 (3) MG/3ML IN SOLN
3.0000 mL | Freq: Four times a day (QID) | RESPIRATORY_TRACT | Status: DC
  Administered 2024-06-10 (×2): 3 mL via RESPIRATORY_TRACT
  Filled 2024-06-10 (×2): qty 3

## 2024-06-10 MED ORDER — SODIUM CHLORIDE 0.9 % IV SOLN
500.0000 mg | INTRAVENOUS | Status: AC
  Administered 2024-06-11 – 2024-06-14 (×4): 500 mg via INTRAVENOUS
  Filled 2024-06-10 (×5): qty 5

## 2024-06-10 MED ORDER — ACETAMINOPHEN 650 MG RE SUPP: 650.0000 mg | Freq: Four times a day (QID) | RECTAL | Status: DC | PRN

## 2024-06-10 MED ORDER — PREDNISONE 20 MG PO TABS: 40.0000 mg | ORAL_TABLET | Freq: Every day | ORAL | Status: DC

## 2024-06-10 MED ORDER — IPRATROPIUM-ALBUTEROL 0.5-2.5 (3) MG/3ML IN SOLN
3.0000 mL | Freq: Three times a day (TID) | RESPIRATORY_TRACT | Status: DC
  Administered 2024-06-11 – 2024-06-12 (×4): 3 mL via RESPIRATORY_TRACT
  Filled 2024-06-10 (×4): qty 3

## 2024-06-10 MED ORDER — HYDROCODONE-ACETAMINOPHEN 5-325 MG PO TABS
1.0000 | ORAL_TABLET | ORAL | Status: DC | PRN
  Administered 2024-06-10 – 2024-06-11 (×2): 2 via ORAL
  Administered 2024-06-12: 1 via ORAL
  Administered 2024-06-12 – 2024-06-13 (×2): 2 via ORAL
  Administered 2024-06-13: 1 via ORAL
  Administered 2024-06-14 (×2): 2 via ORAL
  Filled 2024-06-10 (×2): qty 1
  Filled 2024-06-10 (×2): qty 2
  Filled 2024-06-10: qty 1
  Filled 2024-06-10 (×4): qty 2

## 2024-06-10 MED ORDER — ASPIRIN 81 MG PO TBEC
81.0000 mg | DELAYED_RELEASE_TABLET | Freq: Every day | ORAL | Status: DC
  Administered 2024-06-10 – 2024-06-15 (×6): 81 mg via ORAL
  Filled 2024-06-10 (×6): qty 1

## 2024-06-10 MED ORDER — GUAIFENESIN ER 600 MG PO TB12
600.0000 mg | ORAL_TABLET | Freq: Two times a day (BID) | ORAL | Status: DC
  Administered 2024-06-10 – 2024-06-15 (×12): 600 mg via ORAL
  Filled 2024-06-10 (×12): qty 1

## 2024-06-10 MED ORDER — ONDANSETRON HCL 4 MG/2ML IJ SOLN: 4.0000 mg | Freq: Four times a day (QID) | INTRAMUSCULAR | Status: DC | PRN

## 2024-06-10 MED ORDER — INSULIN ASPART 100 UNIT/ML IJ SOLN
0.0000 [IU] | Freq: Three times a day (TID) | INTRAMUSCULAR | Status: DC
  Administered 2024-06-10 – 2024-06-11 (×2): 2 [IU] via SUBCUTANEOUS
  Filled 2024-06-10 (×2): qty 2

## 2024-06-10 MED ORDER — PANTOPRAZOLE SODIUM 40 MG PO TBEC
40.0000 mg | DELAYED_RELEASE_TABLET | Freq: Every day | ORAL | Status: DC
  Administered 2024-06-10 – 2024-06-15 (×6): 40 mg via ORAL
  Filled 2024-06-10 (×6): qty 1

## 2024-06-10 MED ORDER — METHYLPREDNISOLONE SODIUM SUCC 40 MG IJ SOLR
40.0000 mg | Freq: Two times a day (BID) | INTRAMUSCULAR | Status: DC
  Administered 2024-06-11 – 2024-06-15 (×9): 40 mg via INTRAVENOUS
  Filled 2024-06-10 (×9): qty 1

## 2024-06-10 MED ORDER — ENOXAPARIN SODIUM 40 MG/0.4ML IJ SOSY
40.0000 mg | PREFILLED_SYRINGE | INTRAMUSCULAR | Status: DC
  Administered 2024-06-10 – 2024-06-15 (×6): 40 mg via SUBCUTANEOUS
  Filled 2024-06-10 (×6): qty 0.4

## 2024-06-10 NOTE — Telephone Encounter (Signed)
 Patient is currently admitted to hospital for COPD.   Will contact for new start [name] when she discharges.

## 2024-06-10 NOTE — Assessment & Plan Note (Addendum)
 COPD exacerbation Interstitial lung disease with exacerbation History of lung cancer s/p radiation CTA chest negative for PE, showing interstitial edema and acute infiltrate left upper lobe Recent right heart cath 04/26/2024 showing borderline elevated PA pressure Rocephin  and azithromycin  Scheduled and as needed DuoNebs IV steroids Flutter valve and incentive spirometer Antitussives Can consider pulmonology consult

## 2024-06-10 NOTE — Assessment & Plan Note (Signed)
 CAD (LAD calcification on chest CT) CTA chest with interstitial edema but no history of CHF Patient clinically euvolemic.  Pleural BNP minimally elevated at 400 Echo 02/2024 with EF 60 to 65% Nonischemic stress test 02/2024 Low-dose IV Lasix  Daily weights with intake and output monitoring Expecting improvement with improvement in respiratory status

## 2024-06-10 NOTE — H&P (Signed)
 " History and Physical    Patient: Joy Ball FMW:[id number] DOB: 05/11/1966 DOA: 06/09/2024 DOS: the patient was seen and examined on 06/10/2024 PCP: Edman Marsa PARAS, DO  Patient coming from: Home  Chief Complaint:  Chief Complaint  Patient presents with   Shortness of Breath    HPI: Joy Ball is a 58 y.o. female with medical history significant for CAD (LAD calcification on chest CT), former smoking x 40+ years, COPD, ILD on 2 L home oxygen , right upper lobe adenocarcinoma of the lung s/p radiation(not a candidate for surgery),, right heart cath 04/26/2024 that showed borderline elevated pulmonary arterial pressure, being admitted with pneumonia and pulmonary edema.  No history of CHF and had a reassuring cardiac workup with echo and stress test in September 2025.  Patient is currently under management by her pulmonologist for ongoing dyspnea and recently completed a course of antibiotics and prednisone .  Due to persistent symptoms she had dial up her O2 to 6 L due to sats occasionally in the 60s.  EMS administered DuoNebs and Solu-Medrol  en route. In the ED, tachypneic to 28, with O2 sats 92% on 4 L. WBC 19,000 and respiratory viral panel negative for COVID flu and RSV.  Troponin less than 15 and proBNP 498.  D-dimer 0.63.Hemoglobin 11.8 slightly down from baseline of 13.  CMP unremarkable.  EKG showing sinus at 93 CTA chest negative for PE but showing diffuse interstitial edema and likely acute infiltrate lateral aspect of left upper lobe. Patient treated with DuoNebs and Pulmicort  nebulizer, IV Lasix  and started on Rocephin  and azithromycin . Admission requested.     Past Medical History:  Diagnosis Date   Allergy Idk   Arthritis Idk   Asthma    Cancer (HCC)    Cataract Idk   COPD (chronic obstructive pulmonary disease) (HCC)    Dyspnea    Emphysema lung (HCC)    Emphysema of lung (HCC) Idk   GERD (gastroesophageal reflux disease)    Lung cancer (HCC) 2024    Lung nodule    Migraines    Pulmonary fibrosis (HCC)    Seasonal allergies    Past Surgical History:  Procedure Laterality Date   CESAREAN SECTION     EYE SURGERY Bilateral 01/2023   RIGHT HEART CATH Right 04/26/2024   Procedure: RIGHT HEART CATH;  Surgeon: Mady Bruckner, MD;  Location: [facility] INVASIVE CV LAB;  Service: Cardiovascular;  Laterality: Right;   TUBAL LIGATION     Social History:  reports that she quit smoking about 15 months ago. Her smoking use included cigarettes. She started smoking about 40 years ago. She has a 121.3 pack-year smoking history. She has never used smokeless tobacco. She reports current alcohol use. She reports that she does not use drugs.  Allergies[1]  Family History  Problem Relation Age of Onset   Lung cancer Father        was a smoker   Cancer Father    Heart disease Maternal Grandfather    Emphysema Mother    Arthritis Mother    Asthma Mother    COPD Mother    Vision loss Mother    Varicose Veins Mother    Miscarriages / Stillbirths Sister    Miscarriages / Stillbirths Sister     Prior to Admission medications  Medication Sig Start Date End Date Taking? Authorizing Provider  acetaminophen  (TYLENOL ) 500 MG tablet Take 1,500 mg by mouth 3 (three) times daily. 2 to 3 times a day.    [provider]  aspirin  EC 81 MG tablet Take 1 tablet (81 mg total) by mouth daily. Swallow whole. 03/03/24   Darliss Rogue, MD  budesonide -formoterol  (SYMBICORT ) 160-4.5 MCG/ACT inhaler Inhale 2 puffs into the lungs daily. 08/26/23   Isadora Hose, MD  buPROPion  (WELLBUTRIN  SR) 150 MG 12 hr tablet Take 1 tablet (150 mg total) by mouth 2 (two) times daily. 05/13/24   Karamalegos, Marsa PARAS, DO  fluticasone  (FLONASE ) 50 MCG/ACT nasal spray Place 2 sprays into both nostrils daily. Patient taking differently: Place 2 sprays into both nostrils daily as needed for allergies. 03/30/22   Gladis Elsie BROCKS, PA-C  gabapentin  (NEURONTIN ) 300 MG capsule TAKE 1  CAPSULE(300 MG) BY MOUTH AT BEDTIME 05/18/24   Karamalegos, Alexander J, DO  ipratropium (ATROVENT ) 0.06 % nasal spray Place 2 sprays into both nostrils 4 (four) times daily as needed (allergies).    [provider]  ipratropium-albuterol  (DUONEB) 0.5-2.5 (3) MG/3ML SOLN Take 3 mLs by nebulization 2 (two) times daily. Patient taking differently: Take 3 mLs by nebulization 2 (two) times daily as needed (breathing). 01/04/24   Jhonny Calvin NOVAK, MD  Loperamide  HCl (LOPERAMIDE  A-D PO) Take 1 tablet by mouth daily as needed.    [provider]  loratadine  (CLARITIN ) 10 MG tablet Take 10 mg by mouth daily.    [provider]  naproxen  (NAPROSYN ) 500 MG tablet TAKE 1 TABLET(500 MG) BY MOUTH TWICE DAILY WITH A MEAL 04/05/24   Karamalegos, Marsa PARAS, DO  Nintedanib (OFEV ) 100 MG CAPS Take 1 capsule (100 mg total) by mouth 2 (two) times daily. 04/14/24   Geronimo Amel, MD  nystatin  (MYCOSTATIN ) 100000 UNIT/ML suspension SHAKE LIQUID AND TAKE 5 ML BY MOUTH FOUR TIMES DAILY Patient taking differently: Take 5 mLs by mouth 4 (four) times daily as needed (thrush). 10/27/23   Karamalegos, Marsa PARAS, DO  omeprazole  (PRILOSEC) 40 MG capsule Take 1 capsule (40 mg total) by mouth in the morning and at bedtime. 03/07/24   Cobb, Comer GAILS, NP  ondansetron  (ZOFRAN ) 4 MG tablet Take 1 tablet (4 mg total) by mouth every 8 (eight) hours as needed for nausea or vomiting. Take this 30 minutes before each pirfenidone  dose 02/19/24   Geronimo Amel, MD  ondansetron  (ZOFRAN -ODT) 4 MG disintegrating tablet Take 1 tablet (4 mg total) by mouth every 8 (eight) hours as needed for nausea or vomiting. 02/19/24   Edman, Marsa PARAS, DO  Tiotropium Bromide  Monohydrate (SPIRIVA  RESPIMAT) 2.5 MCG/ACT AERS Inhale 2 puffs into the lungs daily. 08/26/23   Isadora Hose, MD  VENTOLIN  HFA 108 (90 Base) MCG/ACT inhaler INHALE 2 PUFFS INTO THE LUNGS EVERY 6 HOURS AS NEEDED FOR WHEEZING OR SHORTNESS OF BREATH  04/05/24   Edman Marsa PARAS, DO    Physical Exam: Vitals:   06/09/24 2249 06/09/24 2252 06/10/24 0000 06/10/24 0241  BP: 135/81  124/78 121/78  Pulse: 92  98 100  Resp: (!) 28  (!) 24 (!) 27  Temp: 98.2 F (36.8 C)   98 F (36.7 C)  TempSrc: Oral     SpO2: 95%  92% 95%  Height:  5' (1.524 m)     Physical Exam Vitals and nursing note reviewed.  Constitutional:      General: She is not in acute distress.    Comments: Patient sitting with head of bed elevated, conversational dyspnea, tachypneic  HENT:     Head: Normocephalic and atraumatic.  Cardiovascular:     Rate and Rhythm: Normal rate and regular rhythm.  Heart sounds: Normal heart sounds.  Pulmonary:     Effort: Tachypnea present.     Comments: Coarse breath sounds, wheezes Abdominal:     Palpations: Abdomen is soft.     Tenderness: There is no abdominal tenderness.  Neurological:     Mental Status: Mental status is at baseline.     Labs on Admission: I have personally reviewed following labs and imaging studies  CBC: Recent Labs  Lab 06/09/24 2312  WBC 19.1*  NEUTROABS 15.3*  HGB 11.8*  HCT 36.5  MCV 98.4  PLT 495*   Basic Metabolic Panel: Recent Labs  Lab 06/09/24 2312  NA 140  K 3.4*  CL 105  CO2 25  GLUCOSE 99  BUN 24*  CREATININE 0.56  CALCIUM 8.8*   GFR: Estimated Creatinine Clearance: 55.7 mL/min (by C-G formula based on SCr of 0.56 mg/dL). Liver Function Tests: Recent Labs  Lab 06/09/24 2312  AST 23  ALT 9  ALKPHOS 171*  BILITOT 0.3  PROT 6.2*  ALBUMIN 3.5   No results for input(s): LIPASE, AMYLASE in the last 168 hours. No results for input(s): AMMONIA in the last 168 hours. Coagulation Profile: No results for input(s): INR, PROTIME in the last 168 hours. Cardiac Enzymes: No results for input(s): CKTOTAL, CKMB, CKMBINDEX, TROPONINI in the last 168 hours. BNP (last 3 results) Recent Labs    06/09/24 2330  PROBNP 498.0*   HbA1C: No results  for input(s): HGBA1C in the last 72 hours. CBG: No results for input(s): GLUCAP in the last 168 hours. Lipid Profile: No results for input(s): CHOL, HDL, LDLCALC, TRIG, CHOLHDL, LDLDIRECT in the last 72 hours. Thyroid  Function Tests: No results for input(s): TSH, T4TOTAL, FREET4, T3FREE, THYROIDAB in the last 72 hours. Anemia Panel: No results for input(s): VITAMINB12, FOLATE, FERRITIN, TIBC, IRON, RETICCTPCT in the last 72 hours. Urine analysis:    Component Value Date/Time   COLORURINE YELLOW (A) 07/11/2015 2237   APPEARANCEUR CLEAR (A) 07/11/2015 2237   LABSPEC 1.012 07/11/2015 2237   PHURINE 6.0 07/11/2015 2237   GLUCOSEU NEGATIVE 07/11/2015 2237   HGBUR 1+ (A) 07/11/2015 2237   BILIRUBINUR negative 07/27/2015 1028   KETONESUR NEGATIVE 07/11/2015 2237   PROTEINUR negative 07/27/2015 1028   PROTEINUR NEGATIVE 07/11/2015 2237   UROBILINOGEN negative 07/27/2015 1028   NITRITE negative 07/27/2015 1028   NITRITE NEGATIVE 07/11/2015 2237   LEUKOCYTESUR Negative 07/27/2015 1028    Radiological Exams on Admission: CT Angio Chest PE W/Cm &/Or Wo Cm Result Date: 06/10/2024 EXAM: CTA of the Chest with contrast for PE 06/10/2024 02:06:34 AM TECHNIQUE: CTA of the chest was performed without and with the administration of 75 mL of intravenous iohexol  (OMNIPAQUE ) 350 MG/ML injection. Multiplanar reformatted images are provided for review. MIP images are provided for review. Automated exposure control, iterative reconstruction, and/or weight based adjustment of the mA/kV was utilized to reduce the radiation dose to as low as reasonably achievable. COMPARISON: 02/12/2024 plain film from the previous day. SABRA CLINICAL HISTORY: Shortness of breath. FINDINGS: PULMONARY ARTERIES: Pulmonary arteries are adequately opacified for evaluation. The pulmonary artery shows a normal branching pattern. No filling defect to suggest pulmonary embolism is noted. Main pulmonary  artery is normal in caliber. MEDIASTINUM: The heart is at the upper limits of normal in size. Mild coronary calcifications are seen. Thoracic aorta shows atherosclerotic calcifications without an aneurysmal dilatation. The esophagus is within normal limits. LYMPH NODES: Scattered mediastinal lymph nodes are identified and stable from the prior exam. These are likely  reactive in nature. The largest of these lies in the subcarinal region measuring up to 15 mm. No hilar or axillary lymphadenopathy. LUNGS AND PLEURA: The lungs are well aerated bilaterally. Bronchiectatic changes are seen. Diffuse emphysematous changes are noted. Right apical scarring is again seen, consistent with the patient's given clinical history and likely post-irradiation therapy. Infiltrative changes are seen in the lateral aspect of the left upper lobe new when compared with the prior study, likely representing acute infiltrate. Diffuse interstitial edema is noted particularly in the lower lobes bilaterally. No pleural effusion or pneumothorax. UPPER ABDOMEN: The visualized upper abdomen is within normal limits. SOFT TISSUES AND BONES: No acute bone or soft tissue abnormality. No bony abnormality is noted. IMPRESSION: 1. No evidence of pulmonary embolism. 2. Diffuse interstitial edema, particularly in the lower lobes bilaterally. No effusion. 3. Likely acute infiltrate in the lateral aspect of the left upper lobe. 4. Chronic changes in the right apex. Electronically signed by: Oneil Devonshire MD 06/10/2024 02:16 AM EST RP Workstation: [id number]   DG Chest Port 1 View Result Date: 06/09/2024 EXAM: 1 VIEW(S) XRAY OF THE CHEST 06/09/2024 11:30:46 PM COMPARISON: Chest x-ray 05/31/2024 and chest CT 02/12/2024. CLINICAL HISTORY: shob copd FINDINGS: LUNGS AND PLEURA: Stable right upper lobe parenchymal opacity. Stable strandy and patchy opacities in the peripheral left upper lobe. Chronic interstitial opacities in the lung bases compatible with  fibrosis as seen on the prior CT. No new focal lung infiltrate. No pleural effusion. No pneumothorax. HEART AND MEDIASTINUM: No acute abnormality of the cardiac and mediastinal silhouettes. BONES AND SOFT TISSUES: No acute osseous abnormality. IMPRESSION: 1. Stable bilateral upper lobes parenchymal opacities. 2. Chronic bibasilar interstitial opacities consistent with fibrosis. 3. No new focal lung infiltrate, pleural effusion, or pneumothorax. Electronically signed by: Greig Pique MD 06/09/2024 11:33 PM EST RP Workstation: [id number]   Data Reviewed for HPI: Relevant notes from primary care and specialist visits, past discharge summaries as available in EHR, including Care Everywhere. Prior diagnostic testing as pertinent to current admission diagnoses Updated medications and problem lists for reconciliation ED course, including vitals, labs, imaging, treatment and response to treatment Triage notes, nursing and pharmacy notes and ED provider's notes Notable results as noted above in HPI      Assessment and Plan: * Acute on chronic respiratory failure with hypoxia (HCC) Multifactorial: ILD/COPD exacerbation, pneumonia, acute heart failure, underlying lung cancer CTA chest showing interstitial edema and pneumonia.  Negative for PE Patient requiring 5 L up from baseline of 2 L Treat acute/chronic respiratory illnesses (please see respective problems) Continue supplemental oxygen  and wean as tolerated  CAP (community acquired pneumonia) COPD exacerbation Interstitial lung disease with exacerbation History of lung cancer s/p radiation CTA chest negative for PE, showing interstitial edema and acute infiltrate left upper lobe Recent right heart cath 04/26/2024 showing borderline elevated PA pressure Rocephin  and azithromycin  Scheduled and as needed DuoNebs IV steroids Flutter valve and incentive spirometer Antitussives Can consider pulmonology consult  Possible acute CHF (congestive  heart failure) (HCC) CAD (LAD calcification on chest CT) CTA chest with interstitial edema but no history of CHF Patient clinically euvolemic.  Pleural BNP minimally elevated at 400 Echo 02/2024 with EF 60 to 65% Nonischemic stress test 02/2024 Low-dose IV Lasix  Daily weights with intake and output monitoring Expecting improvement with improvement in respiratory status    DVT prophylaxis: Lovenox   Consults: none  Advance Care Planning:   Code Status: Prior   Family Communication: Significant other and daughter at bedside  Disposition  Plan: Back to previous home environment  Severity of Illness: The appropriate patient status for this patient is OBSERVATION. Observation status is judged to be reasonable and necessary in order to provide the required intensity of service to ensure the patient's safety. The patient's presenting symptoms, physical exam findings, and initial radiographic and laboratory data in the context of their medical condition is felt to place them at decreased risk for further clinical deterioration. Furthermore, it is anticipated that the patient will be medically stable for discharge from the hospital within 2 midnights of admission.   Author: Delayne LULLA Solian, MD 06/10/2024 2:50 AM  For on call review [email or web address].      [1]  Allergies Allergen Reactions   Amoxicillin Hives   "

## 2024-06-10 NOTE — TOC Initial Note (Signed)
 Transition of Care [facility]) - Initial/Assessment Note    Patient Details  Name: Joy Ball MRN: [id number] Date of Birth: 04-03-1966  [facility] CM/SW Contact:    Lauraine JAYSON Carpen, LCSW Phone Number: 06/10/2024, 4:16 PM  Clinical Narrative:   Readmission prevention screen complete. Significant other, son, and other family members at bedside. CSW introduced role and explained that discharge planning would be discussed. PCP is Marsa Officer, DO but she has not seen him in a couple of years. Significant other or son transport her to appointments. Pharmacy is [facility] in [place]. No issues affording medications. Patient lives with her significant other. No home health prior to admission. She has a BSC at home. Patient wears oxygen  through [employer]. She uses 2 L at rest and 6 L with exertion. No further concerns. CSW will continue to follow patient and her family for support and facilitate return home once stable. Family will transport her home at discharge and bring her oxygen  for the ride.               Expected Discharge Plan: Home/Self Care Barriers to Discharge: Continued Medical Work up   Patient Goals and CMS Choice            Expected Discharge Plan and Services     Post Acute Care Choice: NA Living arrangements for the past 2 months: Single Family Home                                      Prior Living Arrangements/Services Living arrangements for the past 2 months: Single Family Home Lives with:: Significant Other Patient language and need for interpreter reviewed:: Yes Do you feel safe going back to the place where you live?: Yes      Need for Family Participation in Patient Care: Yes (Comment) Care giver support system in place?: Yes (comment) Current home services: DME Criminal Activity/Legal Involvement Pertinent to Current Situation/Hospitalization: No - Comment as needed  Activities of Daily Living      Permission  Sought/Granted Permission sought to share information with : Family Supports Permission granted to share information with : Yes, Verbal Permission Granted  Share Information with NAME: Cathlyn Hoit, Josh Schaumb[place]     Permission granted to share info w Relationship: Significant other, son  Permission granted to share info w Contact Information: Cathlyn:  [phone]  Emotional Assessment Appearance:: Appears stated age Attitude/Demeanor/Rapport: Engaged, Gracious Affect (typically observed): Accepting, Appropriate, Calm, Pleasant Orientation: : Oriented to Self, Oriented to Place, Oriented to  Time, Oriented to Situation Alcohol / Substance Use: Not Applicable Psych Involvement: No (comment)  Admission diagnosis:  Shortness of breath [R06.02] Respiratory infection [J98.8] Acute pulmonary edema (HCC) [J81.0] COPD exacerbation (HCC) [J44.1] CAP (community acquired pneumonia) [J18.9] Acute hypoxemic respiratory failure (HCC) [J96.01] Pneumonia due to infectious organism, unspecified laterality, unspecified part of lung [J18.9] Patient Active Problem List   Diagnosis Date Noted   CAP (community acquired pneumonia) 06/10/2024   Acute on chronic respiratory failure with hypoxia (HCC) 06/10/2024   Possible acute CHF (congestive heart failure) (HCC) 06/10/2024   COPD with acute exacerbation (HCC) 06/10/2024   Folate deficiency 05/11/2024   Dyspnea 12/25/2023   Pneumonia due to infectious organism 12/25/2023   Chronic respiratory failure with hypoxia (HCC) 12/24/2023   Adenocarcinoma of right lung (HCC) 08/19/2023   Hypokalemia 08/19/2023   Erythrocytosis 08/19/2023   Macrocytosis without anemia 08/19/2023  Lung nodule 08/06/2023   Rectal bleeding 10/29/2016   Protein-calorie malnutrition, severe 01/13/2016   Dermatitis, eczematoid 07/27/2015   Epigastric pain 07/27/2015   Functional diarrhea 07/27/2015   GERD (gastroesophageal reflux disease) 07/13/2015   Asthma with COPD (HCC)  01/26/2012   Pulmonary fibrosis (HCC) 01/26/2012   Tobacco abuse 01/26/2012   Pulmonary nodule, left 01/26/2012   PCP:  Edman Marsa PARAS, DO Pharmacy:   [facility] [phone] [place],  - [address] AT [facility] [address] [place] [address] [place] [address] Phone: [phone] Fax: [phone]  [facility] - [facility] [address] [place] [address] Phone: [phone] Fax: [phone]  [facility] - [place], [place] - [address] [place] [address] Phone: [phone] Fax: [phone]     Social Drivers of Health (SDOH) Social History: SDOH Screenings   Food Insecurity: Food Insecurity Present (12/25/2023)  Housing: Low Risk (12/25/2023)  Transportation Needs: No Transportation Needs (12/25/2023)  Utilities: Not At Risk (12/25/2023)  Alcohol Screen: Low Risk (11/11/2022)  Depression (PHQ2-9): Low Risk (02/25/2024)  Social Connections: Unknown (12/22/2022)   Received from [facility]  Tobacco Use: Medium Risk (06/09/2024)  Health Literacy: Adequate Health Literacy (08/19/2023)   SDOH Interventions:     Readmission Risk Interventions    06/10/2024    4:13 PM  Readmission Risk Prevention Plan  Transportation Screening Complete  PCP or Specialist Appt within 3-5 Days Complete  Social Work Consult for Recovery Care Planning/Counseling Complete  Palliative Care Screening Not Applicable  Medication Review Oceanographer) Complete

## 2024-06-10 NOTE — ED Notes (Signed)
 Pt up to bedside commode, noted to desat to 68%. Placed on 7

## 2024-06-10 NOTE — Progress Notes (Addendum)
 " PROGRESS NOTE  Joy Ball FMW:[id number] DOB: December 09, 1965   PCP: Joy Ball  Patient is from: Home.  Lives with husband.  DOA: 06/09/2024 LOS: 0  Chief complaints Chief Complaint  Patient presents with   Shortness of Breath     Brief Narrative / Interim Ball: 58 year old F with PMH of PE, ILD, chronic hypoxic RF on 2 L, RUL cancer s/p radiation, coronary artery calcification, borderline PAH and prior smoker with over 40-pack-year Ball before she quit in 02/2023 presenting with progressive dyspnea despite recent antibiotics and steroid course, and admitted with working diagnosis of LUL pneumonia, COPD/ILD exacerbation and possible pulmonary edema.  Patient was started on 6 L due to saturations dropping into the 60s when EMS arrived.  Received DuoNeb and Solu-Medrol  from EMS  In ED, tachypneic to upper 20s.  Saturating in low 90s on 4 L.  WBC 19.  COVID, influenza and RSV PCR nonreactive.  Serial troponin and proBNP negative.  D-dimer 0.63.  CT angio chest negative for PE but concerning for diffuse interstitial edema and LUL infiltrate.  Patient was started on IV Lasix , Zithromax , ceftriaxone , steroid, scheduled and as needed nebulizers and admitted.  Patient continues to have significant dyspnea with significant oxygen  requirement.  20 pathogen RVP and MRSA PCR screen ordered.  Pulmonology consulted   Subjective: Seen and examined earlier this morning.  No major events overnight or this morning.  Continues to endorse significant shortness of breath.  Also reports cough but not able to cough up phlegm.  Denies chest pain.  Husband at bedside.   Assessment and plan: Acute on chronic respiratory failure with hypoxia: Multifactorial including ILD/COPD exacerbation, pneumonia, acute heart failure, underlying lung cancer.  Currently saturating in mid 80s to upper 80s on 6 L.  On 2 L at baseline. CTA chest negative for PE but raises concern for interstitial edema and  LUL infiltrate.  Patient appears euvolemic with negative proBNP and cardiac enzymes arguing against pulmonary edema.  Recent TTE in 02/2024 without significant finding.  She also had a nonischemic stress test at the same time.  Received Lasix  in ED. -COVID-19, influenza and RSV PCR nonreactive. -Continue ceftriaxone  and Zithromax  for pneumonia -Continue systemic steroid, scheduled and as needed nebulizers -Continue IV Lasix  20 mg twice daily -Continue mucolytics and antitussive. -Check 20 pathogen RVP and MRSA PCR -Check CRP and ESR. -Wean oxygen  as able.  Incentive spirometry -Consult pulmonology    Community-acquired pneumonia COPD exacerbation Interstitial lung disease with exacerbation Ball of lung cancer s/p radiation -Management as above  Hypokalemia -Monitor replenish K and Mg as appropriate  Steroid-induced hyperglycemia -Start SSI-moderate -Check hemoglobin A1c  Mood disorder: Stable -Continue home meds    Body mass index is 21.29 kg/m.          DVT prophylaxis:  enoxaparin  (LOVENOX ) injection 40 mg Start: 06/10/24 0800  Code Status: Full code Family Communication: Updated patient's husband at bedside Level of care: Progressive Status is: Inpatient Remains inpatient appropriate because: Acute on chronic respiratory failure with hypoxia, pneumonia, COPD exacerbation and possible ILD exacerbation   Final disposition: Likely home once medically stable   55 minutes with more than 50% spent in reviewing records, counseling patient/family and coordinating care.  Consultants:  Pulmonology  Procedures: None  Microbiology summarized: COVID-19, influenza and RSV PCR nonreactive MRSA PCR screen pending 20 pathogen RVP pending  Objective: Vitals:   06/09/24 2252 06/10/24 0000 06/10/24 0241 06/10/24 0600  BP:  124/78 121/78 109/86  Pulse:  98  100 (!) 102  Resp:  (!) 24 (!) 27 (!) 28  Temp:   98 F (36.7 C) 98.3 F (36.8 C)  TempSrc:    Oral  SpO2:   92% 95% 95%  Height: 5' (1.524 m)       Examination:  GENERAL: No apparent distress.  Nontoxic. HEENT: MMM.  Vision and hearing grossly intact.  NECK: Supple.  No apparent JVD.  RESP: SOB as she speaks.   Rhonchi bilateral. CVS:  RRR. Heart sounds normal.  ABD/GI/GU: BS+. Abd soft, NTND.  MSK/EXT:  Moves extremities. No apparent deformity. No edema.  SKIN: no apparent skin lesion or wound NEURO: AA.  Oriented appropriately.  No apparent focal neuro deficit. PSYCH: Calm. Normal affect.   Sch Meds:  Scheduled Meds:  aspirin  EC  81 mg Oral Daily   buPROPion   150 mg Oral BID   enoxaparin  (LOVENOX ) injection  40 mg Subcutaneous Q24H   furosemide   20 mg Intravenous BID   guaiFENesin   600 mg Oral BID   insulin  aspart  0-15 Units Subcutaneous TID WC   insulin  aspart  0-5 Units Subcutaneous QHS   ipratropium-albuterol   3 mL Nebulization Q6H   methylPREDNISolone  (SOLU-MEDROL ) injection  40 mg Intravenous Q12H   Followed by   Joy Ball ON 06/11/2024] predniSONE   40 mg Oral Q breakfast   pantoprazole   40 mg Oral Daily   potassium chloride   60 mEq Oral Once   Continuous Infusions:  [START ON 06/11/2024] azithromycin      [START ON 06/11/2024] cefTRIAXone  (ROCEPHIN )  IV     PRN Meds:.acetaminophen  **OR** acetaminophen , albuterol , guaiFENesin -dextromethorphan , HYDROcodone -acetaminophen , ondansetron  **OR** ondansetron  (ZOFRAN ) IV  Antimicrobials: Anti-infectives (From admission, onward)    Start     Dose/Rate Route Frequency Ordered Stop   06/11/24 0100  azithromycin  (ZITHROMAX ) 500 mg in sodium chloride  0.9 % 250 mL IVPB        500 mg 250 mL/hr over 60 Minutes Intravenous Every 24 hours 06/10/24 0255 06/15/24 0059   06/11/24 0000  cefTRIAXone  (ROCEPHIN ) 2 g in sodium chloride  0.9 % 100 mL IVPB        2 g 200 mL/hr over 30 Minutes Intravenous Every 24 hours 06/10/24 0255 06/14/24 2359   06/10/24 0300  cefTRIAXone  (ROCEPHIN ) 1 g in sodium chloride  0.9 % 100 mL IVPB        1 g 200 mL/hr  over 30 Minutes Intravenous  Once 06/10/24 0258 06/10/24 0347   06/09/24 2330  cefTRIAXone  (ROCEPHIN ) 1 g in sodium chloride  0.9 % 100 mL IVPB        1 g 200 mL/hr over 30 Minutes Intravenous  Once 06/09/24 2328 06/10/24 0040   06/09/24 2330  azithromycin  (ZITHROMAX ) 500 mg in sodium chloride  0.9 % 250 mL IVPB        500 mg 250 mL/hr over 60 Minutes Intravenous  Once 06/09/24 2328 06/10/24 0145        I have personally reviewed the following labs and images: CBC: Recent Labs  Lab 06/09/24 2312 06/10/24 0900  WBC 19.1* 16.6*  NEUTROABS 15.3*  --   HGB 11.8* 12.1  HCT 36.5 37.2  MCV 98.4 97.6  PLT 495* 445*   BMP &GFR Recent Labs  Lab 06/09/24 2312 06/10/24 0900  NA 140 139  K 3.4* 3.3*  CL 105 100  CO2 25 21*  GLUCOSE 99 255*  BUN 24* 16  CREATININE 0.56 0.72  CALCIUM 8.8* 8.7*  MG  --  2.4   Estimated Creatinine Clearance: 55.7  mL/min (by C-G formula based on SCr of 0.72 mg/dL). Liver & Pancreas: Recent Labs  Lab 06/09/24 2312 06/10/24 0900  AST 23 31  ALT 9 15  ALKPHOS 171* 185*  BILITOT 0.3 0.3  PROT 6.2* 7.3  ALBUMIN 3.5 3.8   No results for input(s): LIPASE, AMYLASE in the last 168 hours. No results for input(s): AMMONIA in the last 168 hours. Diabetic: No results for input(s): HGBA1C in the last 72 hours. No results for input(s): GLUCAP in the last 168 hours. Cardiac Enzymes: No results for input(s): CKTOTAL, CKMB, CKMBINDEX, TROPONINI in the last 168 hours. Recent Labs    06/09/24 2330  PROBNP 498.0*   Coagulation Profile: No results for input(s): INR, PROTIME in the last 168 hours. Thyroid  Function Tests: No results for input(s): TSH, T4TOTAL, FREET4, T3FREE, THYROIDAB in the last 72 hours. Lipid Profile: No results for input(s): CHOL, HDL, LDLCALC, TRIG, CHOLHDL, LDLDIRECT in the last 72 hours. Anemia Panel: No results for input(s): VITAMINB12, FOLATE, FERRITIN, TIBC, IRON,  RETICCTPCT in the last 72 hours. Urine analysis:    Component Value Date/Time   COLORURINE YELLOW (A) 07/11/2015 2237   APPEARANCEUR CLEAR (A) 07/11/2015 2237   LABSPEC 1.012 07/11/2015 2237   PHURINE 6.0 07/11/2015 2237   GLUCOSEU NEGATIVE 07/11/2015 2237   HGBUR 1+ (A) 07/11/2015 2237   BILIRUBINUR negative 07/27/2015 1028   KETONESUR NEGATIVE 07/11/2015 2237   PROTEINUR negative 07/27/2015 1028   PROTEINUR NEGATIVE 07/11/2015 2237   UROBILINOGEN negative 07/27/2015 1028   NITRITE negative 07/27/2015 1028   NITRITE NEGATIVE 07/11/2015 2237   LEUKOCYTESUR Negative 07/27/2015 1028   Sepsis Labs: Invalid input(s): PROCALCITONIN, LACTICIDVEN  Microbiology: Recent Results (from the past 240 hours)  Resp panel by RT-PCR (RSV, Flu A&B, Covid) Anterior Nasal Swab     Status: None   Collection Time: 06/09/24 11:13 PM   Specimen: Anterior Nasal Swab  Result Value Ref Range Status   SARS Coronavirus 2 by RT PCR NEGATIVE NEGATIVE Final    Comment: (NOTE) SARS-CoV-2 target nucleic acids are NOT DETECTED.  The SARS-CoV-2 RNA is generally detectable in upper respiratory specimens during the acute phase of infection. The lowest concentration of SARS-CoV-2 viral copies this assay can detect is 138 copies/mL. A negative result does not preclude SARS-Cov-2 infection and should not be used as the sole basis for treatment or other patient management decisions. A negative result may occur with  improper specimen collection/handling, submission of specimen other than nasopharyngeal swab, presence of viral mutation(s) within the areas targeted by this assay, and inadequate number of viral copies(<138 copies/mL). A negative result must be combined with clinical observations, patient Ball, and epidemiological information. The expected result is Negative.  Fact Sheet for Patients:  bloggercourse.com  Fact Sheet for Healthcare Providers:   seriousbroker.it  This test is no t yet approved or cleared by the [place]  FDA and  has been authorized for detection and/or diagnosis of SARS-CoV-2 by FDA under an Emergency Use Authorization (EUA). This EUA will remain  in effect (meaning this test can be used) for the duration of the COVID-19 declaration under Section 564(b)(1) of the Act, 21 U.S.C.section 360bbb-3(b)(1), unless the authorization is terminated  or revoked sooner.       Influenza A by PCR NEGATIVE NEGATIVE Final   Influenza B by PCR NEGATIVE NEGATIVE Final    Comment: (NOTE) The Xpert Xpress SARS-CoV-2/FLU/RSV plus assay is intended as an aid in the diagnosis of influenza from Nasopharyngeal swab specimens and should not  be used as a sole basis for treatment. Nasal washings and aspirates are unacceptable for Xpert Xpress SARS-CoV-2/FLU/RSV testing.  Fact Sheet for Patients: bloggercourse.com  Fact Sheet for Healthcare Providers: seriousbroker.it  This test is not yet approved or cleared by the [place]  FDA and has been authorized for detection and/or diagnosis of SARS-CoV-2 by FDA under an Emergency Use Authorization (EUA). This EUA will remain in effect (meaning this test can be used) for the duration of the COVID-19 declaration under Section 564(b)(1) of the Act, 21 U.S.C. section 360bbb-3(b)(1), unless the authorization is terminated or revoked.     Resp Syncytial Virus by PCR NEGATIVE NEGATIVE Final    Comment: (NOTE) Fact Sheet for Patients: bloggercourse.com  Fact Sheet for Healthcare Providers: seriousbroker.it  This test is not yet approved or cleared by the [place]  FDA and has been authorized for detection and/or diagnosis of SARS-CoV-2 by FDA under an Emergency Use Authorization (EUA). This EUA will remain in effect (meaning this test can be used) for  the duration of the COVID-19 declaration under Section 564(b)(1) of the Act, 21 U.S.C. section 360bbb-3(b)(1), unless the authorization is terminated or revoked.  Performed at [facility], [address] [place], [place] [address]     Radiology Studies: CT Angio Chest PE W/Cm &/Or Wo Cm Result Date: 06/10/2024 EXAM: CTA of the Chest with contrast for PE 06/10/2024 02:06:34 AM TECHNIQUE: CTA of the chest was performed without and with the administration of 75 mL of intravenous iohexol  (OMNIPAQUE ) 350 MG/ML injection. Multiplanar reformatted images are provided for review. MIP images are provided for review. Automated exposure control, iterative reconstruction, and/or weight based adjustment of the mA/kV was utilized to reduce the radiation dose to as low as reasonably achievable. COMPARISON: 02/12/2024 plain film from the previous day. Joy Ball: Shortness of breath. FINDINGS: PULMONARY ARTERIES: Pulmonary arteries are adequately opacified for evaluation. The pulmonary artery shows a normal branching pattern. No filling defect to suggest pulmonary embolism is noted. Main pulmonary artery is normal in caliber. MEDIASTINUM: The heart is at the upper limits of normal in size. Mild coronary calcifications are seen. Thoracic aorta shows atherosclerotic calcifications without an aneurysmal dilatation. The esophagus is within normal limits. LYMPH NODES: Scattered mediastinal lymph nodes are identified and stable from the prior exam. These are likely reactive in nature. The largest of these lies in the subcarinal region measuring up to 15 mm. No hilar or axillary lymphadenopathy. LUNGS AND PLEURA: The lungs are well aerated bilaterally. Bronchiectatic changes are seen. Diffuse emphysematous changes are noted. Right apical scarring is again seen, consistent with the patient's given clinical Ball and likely post-irradiation therapy. Infiltrative changes are seen in the lateral aspect of the  left upper lobe new when compared with the prior study, likely representing acute infiltrate. Diffuse interstitial edema is noted particularly in the lower lobes bilaterally. No pleural effusion or pneumothorax. UPPER ABDOMEN: The visualized upper abdomen is within normal limits. SOFT TISSUES AND BONES: No acute bone or soft tissue abnormality. No bony abnormality is noted. IMPRESSION: 1. No evidence of pulmonary embolism. 2. Diffuse interstitial edema, particularly in the lower lobes bilaterally. No effusion. 3. Likely acute infiltrate in the lateral aspect of the left upper lobe. 4. Chronic changes in the right apex. Electronically signed by: Oneil Devonshire MD 06/10/2024 02:16 AM EST RP Workstation: [id number]   DG Chest Port 1 View Result Date: 06/09/2024 EXAM: 1 VIEW(S) XRAY OF THE CHEST 06/09/2024 11:30:46 PM COMPARISON: Chest x-ray 05/31/2024 and chest CT  02/12/2024. CLINICAL Ball: shob copd FINDINGS: LUNGS AND PLEURA: Stable right upper lobe parenchymal opacity. Stable strandy and patchy opacities in the peripheral left upper lobe. Chronic interstitial opacities in the lung bases compatible with fibrosis as seen on the prior CT. No new focal lung infiltrate. No pleural effusion. No pneumothorax. HEART AND MEDIASTINUM: No acute abnormality of the cardiac and mediastinal silhouettes. BONES AND SOFT TISSUES: No acute osseous abnormality. IMPRESSION: 1. Stable bilateral upper lobes parenchymal opacities. 2. Chronic bibasilar interstitial opacities consistent with fibrosis. 3. No new focal lung infiltrate, pleural effusion, or pneumothorax. Electronically signed by: Greig Pique MD 06/09/2024 11:33 PM EST RP Workstation: [id number]      Joy Ball [facility]  If 7PM-7AM, please contact night-coverage [email or web address] 06/10/2024, 10:22 AM   "

## 2024-06-10 NOTE — Plan of Care (Signed)
 COPD/ILD exacerbation from LUL pneumonia CT chest reviewed  BP 116/82 (BP Location: Right Arm)   Pulse 97   Temp 98.5 F (36.9 C)   Resp 17   Ht 5' (1.524 m)   SpO2 (!) 89%   BMI 21.29 kg/m    -Continue ceftriaxone  and Zithromax  for pneumonia -Continue IV systemic steroid,  -scheduled and as needed nebulizers -Continue IV Lasix  20 mg twice daily -Continue mucolytics and antitussive. -Wean oxygen  as able.  Incentive spirometry Keep O2sats greater than 88%  Full Consult note to follow

## 2024-06-10 NOTE — ED Notes (Signed)
 Pt reports needing to use the restroom, offered purewick due to pt's dyspnea on exertion. Pt declined, requested bedside commode. Pt assisted to bedside commode and then back into bed. Noted to desat to 68% on 6L [place]. Pt increased to 7L, now 96% after approx 1 minute

## 2024-06-10 NOTE — Assessment & Plan Note (Signed)
 Multifactorial: ILD/COPD exacerbation, pneumonia, acute heart failure, underlying lung cancer CTA chest showing interstitial edema and pneumonia.  Negative for PE Patient requiring 5 L up from baseline of 2 L Treat acute/chronic respiratory illnesses (please see respective problems) Continue supplemental oxygen  and wean as tolerated

## 2024-06-11 ENCOUNTER — Inpatient Hospital Stay

## 2024-06-11 DIAGNOSIS — J9621 Acute and chronic respiratory failure with hypoxia: Secondary | ICD-10-CM | POA: Diagnosis not present

## 2024-06-11 DIAGNOSIS — J8410 Pulmonary fibrosis, unspecified: Secondary | ICD-10-CM | POA: Diagnosis not present

## 2024-06-11 DIAGNOSIS — J441 Chronic obstructive pulmonary disease with (acute) exacerbation: Secondary | ICD-10-CM | POA: Diagnosis not present

## 2024-06-11 DIAGNOSIS — J189 Pneumonia, unspecified organism: Secondary | ICD-10-CM | POA: Diagnosis not present

## 2024-06-11 LAB — RENAL FUNCTION PANEL
Albumin: 3.6 g/dL (ref 3.5–5.0)
Anion gap: 13 (ref 5–15)
BUN: 24 mg/dL — ABNORMAL HIGH (ref 6–20)
CO2: 24 mmol/L (ref 22–32)
Calcium: 8.9 mg/dL (ref 8.9–10.3)
Chloride: 101 mmol/L (ref 98–111)
Creatinine, Ser: 0.79 mg/dL (ref 0.44–1.00)
GFR, Estimated: >60 mL/min
Glucose, Bld: 107 mg/dL — ABNORMAL HIGH (ref 70–99)
Phosphorus: 4 mg/dL (ref 2.5–4.6)
Potassium: 4.6 mmol/L (ref 3.5–5.1)
Sodium: 138 mmol/L (ref 135–145)

## 2024-06-11 LAB — CBC
HCT: 37.4 % (ref 36.0–46.0)
Hemoglobin: 11.8 g/dL — ABNORMAL LOW (ref 12.0–15.0)
MCH: 31.5 pg (ref 26.0–34.0)
MCHC: 31.6 g/dL (ref 30.0–36.0)
MCV: 99.7 fL (ref 80.0–100.0)
Platelets: 475 K/uL — ABNORMAL HIGH (ref 150–400)
RBC: 3.75 MIL/uL — ABNORMAL LOW (ref 3.87–5.11)
RDW: 14.3 % (ref 11.5–15.5)
WBC: 19 K/uL — ABNORMAL HIGH (ref 4.0–10.5)
nRBC: 0 % (ref 0.0–0.2)

## 2024-06-11 LAB — BLOOD GAS, ARTERIAL
Acid-Base Excess: 6.2 mmol/L — ABNORMAL HIGH (ref 0.0–2.0)
Bicarbonate: 29.5 mmol/L — ABNORMAL HIGH (ref 20.0–28.0)
O2 Content: 10 L/min
O2 Saturation: 94.9 %
Patient temperature: 37
pCO2 arterial: 37 mmHg (ref 32–48)
pH, Arterial: 7.51 — ABNORMAL HIGH (ref 7.35–7.45)
pO2, Arterial: 79 mmHg — ABNORMAL LOW (ref 83–108)

## 2024-06-11 LAB — GLUCOSE, CAPILLARY
Glucose-Capillary: 97 mg/dL (ref 70–99)
Glucose-Capillary: 137 mg/dL — ABNORMAL HIGH (ref 70–99)
Glucose-Capillary: 91 mg/dL (ref 70–99)
Glucose-Capillary: 131 mg/dL — ABNORMAL HIGH (ref 70–99)

## 2024-06-11 LAB — MAGNESIUM: Magnesium: 2.5 mg/dL — ABNORMAL HIGH (ref 1.7–2.4)

## 2024-06-11 MED ORDER — IPRATROPIUM-ALBUTEROL 0.5-2.5 (3) MG/3ML IN SOLN: 3.0000 mL | RESPIRATORY_TRACT | Status: DC | PRN

## 2024-06-11 NOTE — Progress Notes (Signed)
"  ° °      Overnight   NAME: Joy Ball MRN: [id number] DOB : 25-Dec-1965    Date of Service   06/11/2024   HPI/Events of Note   HPI:58 year old female with past medical history of PE, ILD, chronic hypoxic respiratory failure on 2 L at baseline.  Right upper lobe lung cancer.  S/PE radiation, coronary artery calcification, borderline PAH and prior smoker with a 40 pack/year history reports she quit in 02/2023.  Who presented with progressive dyspnea despite recent antibiotics and steroid course, and admitted with working diagnosis of left lower lobe pneumonia, COPD, ILD exacerbation with possible pulmonary edema.  Patient was started on 6 L due to saturations dropping into the 60s when EMS arrived.  Received a DuoNeb and Solu-Medrol  from EMS.  In the emergency department she was tachypneic up to the 20s saturating in the low 90s on 4 L.  White blood cell count 19.  COVID influenza and RSV negative.  Serial troponins and proBNP negative.  D-dimer 0.63.  CT angio chest negative for PE but concerning for diffuse interstitial edema and left upper lung infiltrate.  Patient was started on IV Lasix , Zithromax , ceftriaxone , steroid, scheduled and as needed nebulizers and admitted.  Pulmonology following  Overnight: Respiratory therapist called to bedside due to patient's saturation 85% on 6 L.  Patient was changed to cool high flow with saturation of 94.  As needed DuoNeb administered ABG was ordered as well as chest x-ray.   Physical Exam Eyes:     Pupils: Pupils are equal, round, and reactive to light.  Cardiovascular:     Rate and Rhythm: Normal rate and regular rhythm.  Pulmonary:     Effort: Tachypnea and respiratory distress present.     Breath sounds: Decreased air movement present. Examination of the right-upper field reveals decreased breath sounds. Examination of the right-middle field reveals decreased breath sounds. Examination of the left-middle field reveals decreased breath sounds.  Examination of the right-lower field reveals decreased breath sounds. Examination of the left-lower field reveals decreased breath sounds. Decreased breath sounds present.  Chest:     Chest wall: No tenderness or edema.  Abdominal:     General: Bowel sounds are normal.     Palpations: Abdomen is soft.  Skin:    General: Skin is warm and dry.     Capillary Refill: Capillary refill takes 2 to 3 seconds.  Neurological:     Mental Status: She is alert.  Psychiatric:        Mood and Affect: Mood is anxious.       Interventions/ Plan   ABG -showing primary respiratory alkalosis Chest Xray -stable lower lung predominant interstitial opacities biapical pleural thickening and scarring    Laneta Almarie Peter BSN RN CCRN AGACNP-BC Acute Care Nurse Practitioner [facility]  "

## 2024-06-11 NOTE — Plan of Care (Signed)
  Problem: Education: Goal: Ability to describe self-care measures that may prevent or decrease complications (Diabetes Survival Skills Education) will improve Outcome: Progressing Goal: Individualized Educational Video(s) Outcome: Progressing   Problem: Coping: Goal: Ability to adjust to condition or change in health will improve Outcome: Progressing   Problem: Fluid Volume: Goal: Ability to maintain a balanced intake and output will improve Outcome: Progressing   Problem: Health Behavior/Discharge Planning: Goal: Ability to identify and utilize available resources and services will improve Outcome: Progressing Goal: Ability to manage health-related needs will improve Outcome: Progressing   Problem: Metabolic: Goal: Ability to maintain appropriate glucose levels will improve Outcome: Progressing   Problem: Nutritional: Goal: Maintenance of adequate nutrition will improve Outcome: Progressing Goal: Progress toward achieving an optimal weight will improve Outcome: Progressing   Problem: Skin Integrity: Goal: Risk for impaired skin integrity will decrease Outcome: Progressing   Problem: Tissue Perfusion: Goal: Adequacy of tissue perfusion will improve Outcome: Progressing   Problem: Education: Goal: Knowledge of General Education information will improve Description: Including pain rating scale, medication(s)/side effects and non-pharmacologic comfort measures Outcome: Progressing   Problem: Health Behavior/Discharge Planning: Goal: Ability to manage health-related needs will improve Outcome: Progressing   Problem: Clinical Measurements: Goal: Ability to maintain clinical measurements within normal limits will improve Outcome: Progressing Goal: Will remain free from infection Outcome: Progressing Goal: Diagnostic test results will improve Outcome: Progressing Goal: Respiratory complications will improve Outcome: Progressing Goal: Cardiovascular complication will  be avoided Outcome: Progressing   Problem: Activity: Goal: Risk for activity intolerance will decrease Outcome: Progressing   Problem: Nutrition: Goal: Adequate nutrition will be maintained Outcome: Progressing   Problem: Coping: Goal: Level of anxiety will decrease Outcome: Progressing   Problem: Elimination: Goal: Will not experience complications related to bowel motility Outcome: Progressing Goal: Will not experience complications related to urinary retention Outcome: Progressing   Problem: Pain Managment: Goal: General experience of comfort will improve and/or be controlled Outcome: Progressing   Problem: Safety: Goal: Ability to remain free from injury will improve Outcome: Progressing   Problem: Skin Integrity: Goal: Risk for impaired skin integrity will decrease Outcome: Progressing   Problem: Education: Goal: Ability to demonstrate management of disease process will improve Outcome: Progressing Goal: Ability to verbalize understanding of medication therapies will improve Outcome: Progressing Goal: Individualized Educational Video(s) Outcome: Progressing   Problem: Activity: Goal: Capacity to carry out activities will improve Outcome: Progressing   Problem: Cardiac: Goal: Ability to achieve and maintain adequate cardiopulmonary perfusion will improve Outcome: Progressing   Problem: Education: Goal: Knowledge of disease or condition will improve Outcome: Progressing Goal: Knowledge of the prescribed therapeutic regimen will improve Outcome: Progressing Goal: Individualized Educational Video(s) Outcome: Progressing   Problem: Activity: Goal: Ability to tolerate increased activity will improve Outcome: Progressing Goal: Will verbalize the importance of balancing activity with adequate rest periods Outcome: Progressing   Problem: Respiratory: Goal: Ability to maintain a clear airway will improve Outcome: Progressing Goal: Levels of oxygenation  will improve Outcome: Progressing Goal: Ability to maintain adequate ventilation will improve Outcome: Progressing   Problem: Activity: Goal: Ability to tolerate increased activity will improve Outcome: Progressing   Problem: Clinical Measurements: Goal: Ability to maintain a body temperature in the normal range will improve Outcome: Progressing   Problem: Respiratory: Goal: Ability to maintain adequate ventilation will improve Outcome: Progressing Goal: Ability to maintain a clear airway will improve Outcome: Progressing

## 2024-06-11 NOTE — Progress Notes (Signed)
 " PROGRESS NOTE  Joy Ball FMW:[id number] DOB: January 09, 1966   PCP: Edman Marsa PARAS, DO  Patient is from: Home.  Lives with husband.  DOA: 06/09/2024 LOS: 1  Chief complaints Chief Complaint  Patient presents with   Shortness of Breath     Brief Narrative / Interim history: 58 year old F with PMH of PE, ILD, chronic hypoxic RF on 2 L, RUL cancer s/p radiation, coronary artery calcification, borderline PAH and prior smoker with over 40-pack-year history before she quit in 02/2023 presenting with progressive dyspnea despite recent antibiotics and steroid course, and admitted with working diagnosis of LUL pneumonia, COPD/ILD exacerbation and possible pulmonary edema.  Patient was started on 6 L due to saturations dropping into the 60s when EMS arrived.  Received DuoNeb and Solu-Medrol  from EMS  In ED, tachypneic to upper 20s.  Saturating in low 90s on 4 L.  WBC 19.  COVID, influenza and RSV PCR nonreactive.  Serial troponin and proBNP negative.  D-dimer 0.63.  CT angio chest negative for PE but concerning for diffuse interstitial edema and LUL infiltrate.  Patient was started on IV Lasix , Zithromax , ceftriaxone , steroid, scheduled and as needed nebulizers and admitted.  A-20 pathogen RVP and MRSA PCR screen nonreactive.  Remains on antibiotics, steroid, scheduled and as needed nebulizers.  Some improvement in oxygen  requirement.  Pulmonology following.  Subjective: Seen and examined earlier this morning.  No major events overnight or this morning.  Reports feeling a little better.  Reports less shortness of breath and chest tightness.  Cough is getting productive.   Assessment and plan: Acute on chronic respiratory failure with hypoxia: Multifactorial including ILD/COPD exacerbation, pneumonia, acute heart failure, underlying lung cancer.  Currently saturating in mid 80s to upper 80s on 6 L.  On 2 L at baseline. CTA chest negative for PE but raises concern for interstitial edema  and LUL infiltrate.  Patient appears euvolemic with negative proBNP and cardiac enzymes arguing against pulmonary edema.  Recent TTE in 02/2024 without significant finding.  She also had a nonischemic stress test at the same time.  COVID-19, RVP and MRSA PCR screen negative.  CRP 15 with ESR of 37 favoring acute inflammatory response -Continue ceftriaxone  and Zithromax  for pneumonia -Continue systemic steroid, scheduled and as needed nebulizers -Discontinue Lasix  -Continue mucolytics and antitussive. -Wean oxygen  as able.  Incentive spirometry -Follow sputum culture. -Follow pulmonology recommendation    Community-acquired pneumonia COPD exacerbation Interstitial lung disease with exacerbation History of lung cancer s/p radiation -Management as above  Steroid-induced hyperglycemia: A1c 5.1%. -Continue SSI while on steroid  Hypokalemia -Monitor replenish K and Mg as appropriate  Mood disorder: Stable -Continue home meds    Body mass index is 20.62 kg/m.          DVT prophylaxis:  enoxaparin  (LOVENOX ) injection 40 mg Start: 06/10/24 0800  Code Status: Full code Family Communication: None at bedside. Level of care: Progressive Status is: Inpatient Remains inpatient appropriate because: Acute on chronic respiratory failure with hypoxia, pneumonia, COPD exacerbation and possible ILD exacerbation   Final disposition: Likely home once medically stable   55 minutes with more than 50% spent in reviewing records, counseling patient/family and coordinating care.  Consultants:  Pulmonology  Procedures: None  Microbiology summarized: COVID-19, influenza and RSV PCR nonreactive MRSA PCR screen negative 20 pathogen RVP negative Sputum culture pending  Objective: Vitals:   06/11/24 0442 06/11/24 0500 06/11/24 0820 06/11/24 1139  BP: 106/72  107/71 111/84  Pulse: 82  89 (!) 105  Resp: 18  19   Temp: 97.9 F (36.6 C)  98.1 F (36.7 C) 97.8 F (36.6 C)  TempSrc: Oral    Oral  SpO2: 90%  (!) 86% 90%  Weight:  47.9 kg    Height:        Examination:  GENERAL: No apparent distress.  Nontoxic. HEENT: MMM.  Vision and hearing grossly intact.  NECK: Supple.  No apparent JVD.  RESP: No IWOB.    rhonchi bilateral. CVS:  RRR. Heart sounds normal.  ABD/GI/GU: BS+. Abd soft, NTND.  MSK/EXT:  Moves extremities. No apparent deformity. No edema.  SKIN: no apparent skin lesion or wound NEURO: AA.  Oriented appropriately.  No apparent focal neuro deficit. PSYCH: Calm. Normal affect.   Sch Meds:  Scheduled Meds:  aspirin  EC  81 mg Oral Daily   budesonide  (PULMICORT ) nebulizer solution  0.5 mg Nebulization BID   buPROPion   150 mg Oral BID   enoxaparin  (LOVENOX ) injection  40 mg Subcutaneous Q24H   furosemide   20 mg Intravenous BID   guaiFENesin   600 mg Oral BID   insulin  aspart  0-15 Units Subcutaneous TID WC   insulin  aspart  0-5 Units Subcutaneous QHS   ipratropium-albuterol   3 mL Nebulization TID   methylPREDNISolone  (SOLU-MEDROL ) injection  40 mg Intravenous Q12H   pantoprazole   40 mg Oral Daily   Continuous Infusions:  azithromycin  500 mg (06/11/24 0146)   cefTRIAXone  (ROCEPHIN )  IV 2 g (06/11/24 0105)   PRN Meds:.acetaminophen  **OR** acetaminophen , guaiFENesin -dextromethorphan , HYDROcodone -acetaminophen , ipratropium-albuterol , ondansetron  **OR** ondansetron  (ZOFRAN ) IV  Antimicrobials: Anti-infectives (From admission, onward)    Start     Dose/Rate Route Frequency Ordered Stop   06/11/24 0100  azithromycin  (ZITHROMAX ) 500 mg in sodium chloride  0.9 % 250 mL IVPB        500 mg 250 mL/hr over 60 Minutes Intravenous Every 24 hours 06/10/24 0255 06/15/24 0059   06/11/24 0000  cefTRIAXone  (ROCEPHIN ) 2 g in sodium chloride  0.9 % 100 mL IVPB        2 g 200 mL/hr over 30 Minutes Intravenous Every 24 hours 06/10/24 0255 06/14/24 2359   06/10/24 0300  cefTRIAXone  (ROCEPHIN ) 1 g in sodium chloride  0.9 % 100 mL IVPB        1 g 200 mL/hr over 30 Minutes  Intravenous  Once 06/10/24 0258 06/10/24 0347   06/09/24 2330  cefTRIAXone  (ROCEPHIN ) 1 g in sodium chloride  0.9 % 100 mL IVPB        1 g 200 mL/hr over 30 Minutes Intravenous  Once 06/09/24 2328 06/10/24 0040   06/09/24 2330  azithromycin  (ZITHROMAX ) 500 mg in sodium chloride  0.9 % 250 mL IVPB        500 mg 250 mL/hr over 60 Minutes Intravenous  Once 06/09/24 2328 06/10/24 0145        I have personally reviewed the following labs and images: CBC: Recent Labs  Lab 06/09/24 2312 06/10/24 0900 06/11/24 0431  WBC 19.1* 16.6* 19.0*  NEUTROABS 15.3*  --   --   HGB 11.8* 12.1 11.8*  HCT 36.5 37.2 37.4  MCV 98.4 97.6 99.7  PLT 495* 445* 475*   BMP &GFR Recent Labs  Lab 06/09/24 2312 06/10/24 0900 06/11/24 0431  NA 140 139 138  K 3.4* 3.3* 4.6  CL 105 100 101  CO2 25 21* 24  GLUCOSE 99 255* 107*  BUN 24* 16 24*  CREATININE 0.56 0.72 0.79  CALCIUM 8.8* 8.7* 8.9  MG  --  2.4 2.5*  PHOS  --   --  4.0   Estimated Creatinine Clearance: 55.7 mL/min (by C-G formula based on SCr of 0.79 mg/dL). Liver & Pancreas: Recent Labs  Lab 06/09/24 2312 06/10/24 0900 06/11/24 0431  AST 23 31  --   ALT 9 15  --   ALKPHOS 171* 185*  --   BILITOT 0.3 0.3  --   PROT 6.2* 7.3  --   ALBUMIN 3.5 3.8 3.6   No results for input(s): LIPASE, AMYLASE in the last 168 hours. No results for input(s): AMMONIA in the last 168 hours. Diabetic: Recent Labs    06/10/24 1714  HGBA1C 5.1   Recent Labs  Lab 06/10/24 1145 06/10/24 1559 06/10/24 2049 06/11/24 0822  GLUCAP 112* 126* 131* 97   Cardiac Enzymes: No results for input(s): CKTOTAL, CKMB, CKMBINDEX, TROPONINI in the last 168 hours. Recent Labs    06/09/24 2330  PROBNP 498.0*   Coagulation Profile: No results for input(s): INR, PROTIME in the last 168 hours. Thyroid  Function Tests: No results for input(s): TSH, T4TOTAL, FREET4, T3FREE, THYROIDAB in the last 72 hours. Lipid Profile: No results for  input(s): CHOL, HDL, LDLCALC, TRIG, CHOLHDL, LDLDIRECT in the last 72 hours. Anemia Panel: No results for input(s): VITAMINB12, FOLATE, FERRITIN, TIBC, IRON, RETICCTPCT in the last 72 hours. Urine analysis:    Component Value Date/Time   COLORURINE YELLOW (A) 07/11/2015 2237   APPEARANCEUR CLEAR (A) 07/11/2015 2237   LABSPEC 1.012 07/11/2015 2237   PHURINE 6.0 07/11/2015 2237   GLUCOSEU NEGATIVE 07/11/2015 2237   HGBUR 1+ (A) 07/11/2015 2237   BILIRUBINUR negative 07/27/2015 1028   KETONESUR NEGATIVE 07/11/2015 2237   PROTEINUR negative 07/27/2015 1028   PROTEINUR NEGATIVE 07/11/2015 2237   UROBILINOGEN negative 07/27/2015 1028   NITRITE negative 07/27/2015 1028   NITRITE NEGATIVE 07/11/2015 2237   LEUKOCYTESUR Negative 07/27/2015 1028   Sepsis Labs: Invalid input(s): PROCALCITONIN, LACTICIDVEN  Microbiology: Recent Results (from the past 240 hours)  Resp panel by RT-PCR (RSV, Flu A&B, Covid) Anterior Nasal Swab     Status: None   Collection Time: 06/09/24 11:13 PM   Specimen: Anterior Nasal Swab  Result Value Ref Range Status   SARS Coronavirus 2 by RT PCR NEGATIVE NEGATIVE Final    Comment: (NOTE) SARS-CoV-2 target nucleic acids are NOT DETECTED.  The SARS-CoV-2 RNA is generally detectable in upper respiratory specimens during the acute phase of infection. The lowest concentration of SARS-CoV-2 viral copies this assay can detect is 138 copies/mL. A negative result does not preclude SARS-Cov-2 infection and should not be used as the sole basis for treatment or other patient management decisions. A negative result may occur with  improper specimen collection/handling, submission of specimen other than nasopharyngeal swab, presence of viral mutation(s) within the areas targeted by this assay, and inadequate number of viral copies(<138 copies/mL). A negative result must be combined with clinical observations, patient history, and  epidemiological information. The expected result is Negative.  Fact Sheet for Patients:  bloggercourse.com  Fact Sheet for Healthcare Providers:  seriousbroker.it  This test is no t yet approved or cleared by the [place]  FDA and  has been authorized for detection and/or diagnosis of SARS-CoV-2 by FDA under an Emergency Use Authorization (EUA). This EUA will remain  in effect (meaning this test can be used) for the duration of the COVID-19 declaration under Section 564(b)(1) of the Act, 21 U.S.C.section 360bbb-3(b)(1), unless the authorization is terminated  or revoked sooner.  Influenza A by PCR NEGATIVE NEGATIVE Final   Influenza B by PCR NEGATIVE NEGATIVE Final    Comment: (NOTE) The Xpert Xpress SARS-CoV-2/FLU/RSV plus assay is intended as an aid in the diagnosis of influenza from Nasopharyngeal swab specimens and should not be used as a sole basis for treatment. Nasal washings and aspirates are unacceptable for Xpert Xpress SARS-CoV-2/FLU/RSV testing.  Fact Sheet for Patients: bloggercourse.com  Fact Sheet for Healthcare Providers: seriousbroker.it  This test is not yet approved or cleared by the [place]  FDA and has been authorized for detection and/or diagnosis of SARS-CoV-2 by FDA under an Emergency Use Authorization (EUA). This EUA will remain in effect (meaning this test can be used) for the duration of the COVID-19 declaration under Section 564(b)(1) of the Act, 21 U.S.C. section 360bbb-3(b)(1), unless the authorization is terminated or revoked.     Resp Syncytial Virus by PCR NEGATIVE NEGATIVE Final    Comment: (NOTE) Fact Sheet for Patients: bloggercourse.com  Fact Sheet for Healthcare Providers: seriousbroker.it  This test is not yet approved or cleared by the [place]  FDA and has been  authorized for detection and/or diagnosis of SARS-CoV-2 by FDA under an Emergency Use Authorization (EUA). This EUA will remain in effect (meaning this test can be used) for the duration of the COVID-19 declaration under Section 564(b)(1) of the Act, 21 U.S.C. section 360bbb-3(b)(1), unless the authorization is terminated or revoked.  Performed at [facility], [address] [place], [place] [address]   MRSA Next Gen by PCR, Nasal     Status: None   Collection Time: 06/10/24  8:13 AM   Specimen: Nasal Mucosa; Nasal Swab  Result Value Ref Range Status   MRSA by PCR Next Gen NOT DETECTED NOT DETECTED Final    Comment: (NOTE) The GeneXpert MRSA Assay (FDA approved for NASAL specimens only), is one component of a comprehensive MRSA colonization surveillance program. It is not intended to diagnose MRSA infection nor to guide or monitor treatment for MRSA infections. Test performance is not FDA approved in patients less than 66 years old. Performed at [facility], [address] [place], [place] [address]   Respiratory (~20 pathogens) panel by PCR     Status: None   Collection Time: 06/10/24  8:13 AM   Specimen: Nasal Mucosa; Respiratory  Result Value Ref Range Status   Adenovirus NOT DETECTED NOT DETECTED Final   Coronavirus 229E NOT DETECTED NOT DETECTED Final    Comment: (NOTE) The Coronavirus on the Respiratory Panel, DOES NOT test for the novel  Coronavirus (2019 nCoV)    Coronavirus HKU1 NOT DETECTED NOT DETECTED Final   Coronavirus NL63 NOT DETECTED NOT DETECTED Final   Coronavirus OC43 NOT DETECTED NOT DETECTED Final   Metapneumovirus NOT DETECTED NOT DETECTED Final   Rhinovirus / Enterovirus NOT DETECTED NOT DETECTED Final   Influenza A NOT DETECTED NOT DETECTED Final   Influenza B NOT DETECTED NOT DETECTED Final   Parainfluenza Virus 1 NOT DETECTED NOT DETECTED Final   Parainfluenza Virus 2 NOT DETECTED NOT DETECTED Final   Parainfluenza Virus 3  NOT DETECTED NOT DETECTED Final   Parainfluenza Virus 4 NOT DETECTED NOT DETECTED Final   Respiratory Syncytial Virus NOT DETECTED NOT DETECTED Final   Bordetella pertussis NOT DETECTED NOT DETECTED Final   Bordetella Parapertussis NOT DETECTED NOT DETECTED Final   Chlamydophila pneumoniae NOT DETECTED NOT DETECTED Final   Mycoplasma pneumoniae NOT DETECTED NOT DETECTED Final    Comment: Performed at [facility], [address], [place], [place] [address]    Radiology Studies: No results found.     Elliott Lasecki T. Chrysa Rampy [facility]  If 7PM-7AM, please contact night-coverage [email or web address] 06/11/2024, 1:14 PM   "

## 2024-06-12 ENCOUNTER — Other Ambulatory Visit: Payer: Self-pay

## 2024-06-12 DIAGNOSIS — J810 Acute pulmonary edema: Secondary | ICD-10-CM | POA: Diagnosis not present

## 2024-06-12 DIAGNOSIS — J988 Other specified respiratory disorders: Secondary | ICD-10-CM

## 2024-06-12 DIAGNOSIS — Z66 Do not resuscitate: Secondary | ICD-10-CM | POA: Diagnosis not present

## 2024-06-12 DIAGNOSIS — Z7189 Other specified counseling: Secondary | ICD-10-CM

## 2024-06-12 DIAGNOSIS — J84112 Idiopathic pulmonary fibrosis: Secondary | ICD-10-CM | POA: Diagnosis not present

## 2024-06-12 DIAGNOSIS — J9601 Acute respiratory failure with hypoxia: Secondary | ICD-10-CM

## 2024-06-12 DIAGNOSIS — Z789 Other specified health status: Secondary | ICD-10-CM | POA: Diagnosis not present

## 2024-06-12 DIAGNOSIS — J441 Chronic obstructive pulmonary disease with (acute) exacerbation: Secondary | ICD-10-CM | POA: Diagnosis not present

## 2024-06-12 DIAGNOSIS — Z515 Encounter for palliative care: Secondary | ICD-10-CM

## 2024-06-12 DIAGNOSIS — J9621 Acute and chronic respiratory failure with hypoxia: Secondary | ICD-10-CM | POA: Diagnosis not present

## 2024-06-12 DIAGNOSIS — J8410 Pulmonary fibrosis, unspecified: Secondary | ICD-10-CM | POA: Diagnosis not present

## 2024-06-12 DIAGNOSIS — J189 Pneumonia, unspecified organism: Secondary | ICD-10-CM | POA: Diagnosis not present

## 2024-06-12 LAB — CBC
HCT: 38.6 % (ref 36.0–46.0)
Hemoglobin: 12.2 g/dL (ref 12.0–15.0)
MCH: 31.8 pg (ref 26.0–34.0)
MCHC: 31.6 g/dL (ref 30.0–36.0)
MCV: 100.5 fL — ABNORMAL HIGH (ref 80.0–100.0)
Platelets: 484 K/uL — ABNORMAL HIGH (ref 150–400)
RBC: 3.84 MIL/uL — ABNORMAL LOW (ref 3.87–5.11)
RDW: 14.4 % (ref 11.5–15.5)
WBC: 14.2 K/uL — ABNORMAL HIGH (ref 4.0–10.5)
nRBC: 0 % (ref 0.0–0.2)

## 2024-06-12 LAB — RENAL FUNCTION PANEL
Albumin: 3.6 g/dL (ref 3.5–5.0)
Anion gap: 14 (ref 5–15)
BUN: 25 mg/dL — ABNORMAL HIGH (ref 6–20)
CO2: 25 mmol/L (ref 22–32)
Calcium: 9 mg/dL (ref 8.9–10.3)
Chloride: 99 mmol/L (ref 98–111)
Creatinine, Ser: 0.71 mg/dL (ref 0.44–1.00)
GFR, Estimated: >60 mL/min
Glucose, Bld: 88 mg/dL (ref 70–99)
Phosphorus: 4 mg/dL (ref 2.5–4.6)
Potassium: 4.2 mmol/L (ref 3.5–5.1)
Sodium: 138 mmol/L (ref 135–145)

## 2024-06-12 LAB — GLUCOSE, CAPILLARY
Glucose-Capillary: 90 mg/dL (ref 70–99)
Glucose-Capillary: 115 mg/dL — ABNORMAL HIGH (ref 70–99)
Glucose-Capillary: 97 mg/dL (ref 70–99)
Glucose-Capillary: 138 mg/dL — ABNORMAL HIGH (ref 70–99)

## 2024-06-12 LAB — MAGNESIUM: Magnesium: 2.4 mg/dL (ref 1.7–2.4)

## 2024-06-12 MED ORDER — IPRATROPIUM-ALBUTEROL 0.5-2.5 (3) MG/3ML IN SOLN
3.0000 mL | RESPIRATORY_TRACT | Status: DC
  Administered 2024-06-12 (×3): 3 mL via RESPIRATORY_TRACT
  Filled 2024-06-12 (×3): qty 3

## 2024-06-12 MED ORDER — SENNOSIDES-DOCUSATE SODIUM 8.6-50 MG PO TABS: 2.0000 | ORAL_TABLET | Freq: Two times a day (BID) | ORAL | Status: DC | PRN

## 2024-06-12 MED ORDER — BUDESONIDE 0.5 MG/2ML IN SUSP
0.5000 mg | Freq: Two times a day (BID) | RESPIRATORY_TRACT | Status: DC
  Administered 2024-06-12 – 2024-06-15 (×7): 0.5 mg via RESPIRATORY_TRACT
  Filled 2024-06-12 (×7): qty 2

## 2024-06-12 MED ORDER — POLYETHYLENE GLYCOL 3350 17 G PO PACK
17.0000 g | PACK | Freq: Two times a day (BID) | ORAL | Status: DC | PRN
  Administered 2024-06-15: 17 g via ORAL
  Filled 2024-06-12: qty 1

## 2024-06-12 MED ORDER — SODIUM CHLORIDE 0.9 % IV SOLN: INTRAVENOUS | Status: AC | PRN

## 2024-06-12 MED ORDER — IPRATROPIUM-ALBUTEROL 0.5-2.5 (3) MG/3ML IN SOLN
3.0000 mL | Freq: Four times a day (QID) | RESPIRATORY_TRACT | Status: AC
  Administered 2024-06-13: 3 mL via RESPIRATORY_TRACT
  Filled 2024-06-12: qty 3

## 2024-06-12 NOTE — Progress Notes (Signed)
 Late entry: Approximately 1940, I was called by covering RN concerning drop in patient's saturation. Upon entry into the room, covering RN was placing patient on cool HFNC. Nebulizer treatments given and saturation had improved 94% on 10L cool high flow.  Covering provider was made aware via secure chat. ABG ordered, results showed respiratory alkalosis with mild hypoxia. Decreased O2 to 8L. No further interventions needed at this time.

## 2024-06-12 NOTE — Evaluation (Signed)
 Physical Therapy Evaluation Patient Details Name: Joy Ball MRN: [id number] DOB: 05-Feb-1966 Today's Date: 06/12/2024  History of Present Illness  58 year old female with past medical history of PE, ILD, chronic hypoxic respiratory failure on 2 L at baseline.  Right upper lobe lung cancer.  S/P  radiation, coronary artery calcification, borderline PAH and prior smoker with a 40 pack/year history reports she quit in 02/2023.  Clinical Impression  Patient noted to be in supine position at PT arrival in room, for an initial PT evaluation due to a decline in functional status, with baseline mobility reported as modI requiring 6L/min. At baseline, and currently requiring 10 L/min. To stand at baseline. The patient is A&O x 4, presenting with good willingness to work with PT. The patient resides in a house with 3 STE and lives with significant other with family/friend support. Vitals are unstable with an SpO? of 88% on 8 L/min at rest and destats to 79% spO2 with standing at bed side on 10 L/min. The overall clinical impression is that the patient presents with moderate mobility limitations secondary to poor activity tolerance. Recommended skilled PT will address safety, mobility, and discharge planning.      If plan is discharge home, recommend the following: A little help with walking and/or transfers;Assist for transportation;Help with stairs or ramp for entrance   Can travel by private vehicle        Equipment Recommendations None recommended by PT  Recommendations for Other Services       Functional Status Assessment       Precautions / Restrictions Restrictions Weight Bearing Restrictions Per Provider Order: No      Mobility  Bed Mobility Overal bed mobility: Modified Independent                  Transfers Overall transfer level: Modified independent Equipment used: 1 person hand held assist                    Ambulation/Gait               General Gait  Details: gait deferred due to destating O2 on 10 L/min. with standing at bedside  Stairs            Wheelchair Mobility     Tilt Bed    Modified Rankin (Stroke Patients Only)       Balance Overall balance assessment: Modified Independent                                           Pertinent Vitals/Pain      Home Living Family/patient expects to be discharged to:: Private residence Living Arrangements: Spouse/significant other Available Help at Discharge: Family;Available PRN/intermittently Type of Home: House Home Access: Stairs to enter Entrance Stairs-Rails: None Entrance Stairs-Number of Steps: 3 steps down and 2 steps up to door no rails Alternate Level Stairs-Number of Steps: 12 Home Layout: Two level;Able to live on main level with bedroom/bathroom Home Equipment: Shower seat;BSC/3in1;Other (comment) Additional Comments: On 2Ls at rest and 6Ls on exertion since July 2025. She has pulse ox to check at home.    Prior Function Prior Level of Function : Independent/Modified Independent                     Extremity/Trunk Assessment   Upper Extremity Assessment Upper Extremity Assessment: Defer to OT evaluation  Lower Extremity Assessment Lower Extremity Assessment: Overall WFL for tasks assessed;Generalized weakness    Cervical / Trunk Assessment Cervical / Trunk Assessment: Normal  Communication   Communication Communication: No apparent difficulties    Cognition Arousal: Alert Behavior During Therapy: WFL for tasks assessed/performed   PT - Cognitive impairments: No apparent impairments                         Following commands: Intact       Cueing Cueing Techniques: Verbal cues     General Comments      Exercises     Assessment/Plan    PT Assessment Patient needs continued PT services  PT Problem List Decreased mobility;Decreased activity tolerance       PT Treatment Interventions  Therapeutic activities    PT Goals (Current goals can be found in the Care Plan section)  Acute Rehab PT Goals Patient Stated Goal: unstated    Frequency Min 1X/week     Co-evaluation               AM-PAC PT 6 Clicks Mobility  Outcome Measure Help needed turning from your back to your side while in a flat bed without using bedrails?: None Help needed moving from lying on your back to sitting on the side of a flat bed without using bedrails?: None Help needed moving to and from a bed to a chair (including a wheelchair)?: None Help needed standing up from a chair using your arms (e.g., wheelchair or bedside chair)?: None Help needed to walk in hospital room?: A Little Help needed climbing 3-5 steps with a railing? : A Lot 6 Click Score: 21    End of Session Equipment Utilized During Treatment: Oxygen  Activity Tolerance: Treatment limited secondary to medical complications (Comment) (destating O2 levels) Patient left: in bed;with call bell/phone within reach;with bed alarm set Nurse Communication: Mobility status PT Visit Diagnosis: Other symptoms and signs involving the nervous system (R29.898)    Time: 8896-8881 PT Time Calculation (min) (ACUTE ONLY): 15 min   Charges:   PT Evaluation $PT Eval Low Complexity: 1 Low   PT General Charges $$ ACUTE PT VISIT: 1 Visit         Sherlean Lesches DPT, PT    Sherlean A Jameeka Marcy 06/12/2024, 11:36 AM

## 2024-06-12 NOTE — TOC Progression Note (Signed)
 Transition of Care [facility]) - Progression Note    Patient Details  Name: Joy Ball MRN: [id number] Date of Birth: August 31, 1965  [facility]) CM/SW Contact  Victory Jackquline GORMAN, RN Phone Number: 06/12/2024, 4:01 PM  Clinical Narrative:     3:38 pm: RNCM received a message via secure chat from NP informing me that the patient would benefit from a walker with seat so she can rest between walking and home activities. DME to be ordered 24 to 48 hours prior to discharge.NP and MD made aware. MD states that the patient is not medically stable for discharge yet. RNCM will continue to follow for discharge planning needs.    Expected Discharge Plan: Home/Self Care Barriers to Discharge: Continued Medical Work up               Expected Discharge Plan and Services     Post Acute Care Choice: NA Living arrangements for the past 2 months: Single Family Home                                       Social Drivers of Health (SDOH) Interventions SDOH Screenings   Food Insecurity: Food Insecurity Present (06/11/2024)  Housing: Low Risk (06/11/2024)  Transportation Needs: No Transportation Needs (06/11/2024)  Utilities: Not At Risk (06/11/2024)  Alcohol Screen: Low Risk (11/11/2022)  Depression (PHQ2-9): Low Risk (02/25/2024)  Social Connections: Unknown (12/22/2022)   Received from [facility]  Tobacco Use: Medium Risk (06/09/2024)  Health Literacy: Adequate Health Literacy (08/19/2023)    Readmission Risk Interventions    06/10/2024    4:13 PM  Readmission Risk Prevention Plan  Transportation Screening Complete  PCP or Specialist Appt within 3-5 Days Complete  Social Work Consult for Recovery Care Planning/Counseling Complete  Palliative Care Screening Not Applicable  Medication Review Oceanographer) Complete

## 2024-06-12 NOTE — Consult Note (Cosign Needed Addendum)
 "                                                                                   Consultation Note Date: 06/12/2024 at 0829 Reason for consultation: Deemed to have poor prognosis due to advanced lung disease.    Patient Name: Joy Ball  DOB: 1965/07/21  MRN: [id number]  Age / Sex: 58 y.o., female  PCP: Joy Marsa PARAS, DO Referring Physician: Kathrin Mignon DASEN, MD  HPI/Patient Profile: 58 y.o. female  with past medical history significant for PE, end stage ILD, chronic hypoxic respiratory failure on 2L home O2, RUL lung cancer s/p radiation, coronary artery calcification, borderline pulmonary arterail hypertension ans prior tobacco smoker (40 pack year, quit 02/2023). Patient presented to ED 06/09/2024 c/o shortness of breath with increased O2 requirement to 4-6 L to maintain 90% and chest tightness.   ED workup resulted K+ 3.4, BUN 24, calcium 8.8, Alk phos 171. Leukocytosis at 19.1, Hgb 11.8, plts 495. BNP 498 and D-dimer 0.63.  ED vitals 135/81, HR 92, SpO2 95% 5L, RR 28, 98.11F  CXR demonstrated  1. Stable bilateral upper lobes parenchymal opacities. 2. Chronic bibasilar interstitial opacities consistent with fibrosis. 3. No new focal lung infiltrate, pleural effusion, or pneumothorax.  CTA Chest showed 1. No evidence of pulmonary embolism. 2. Diffuse interstitial edema, particularly in the lower lobes bilaterally. No effusion. 3. Likely acute infiltrate in the lateral aspect of the left upper lobe. 4. Chronic changes in the right apex.  TRH was consulted for admission and management of acute and chronic respiratory failure with hypoxia, ILD/COPD exacerbation, PNA, acute heart failure and underlying lung cancer.   Palliative medicine team was consulted for assistance with goals of care conversations.   Clinical Assessment and Goals of Care: Extensive chart review completed prior to meeting patient including labs, vital signs, imaging, progress notes, orders, and  available advanced directive documents from current and previous encounters. I then met with patient  to discuss diagnosis prognosis, GOC, EOL wishes, disposition and options.     Latest Ref Rng & Units 06/12/2024    4:18 AM 06/11/2024    4:31 AM 06/10/2024    9:00 AM  CBC  WBC 4.0 - 10.5 K/uL 14.2  19.0  16.6   Hemoglobin 12.0 - 15.0 g/dL 87.7  88.1  87.8   Hematocrit 36.0 - 46.0 % 38.6  37.4  37.2   Platelets 150 - 400 K/uL 484  475  445       Latest Ref Rng & Units 06/12/2024    4:18 AM 06/11/2024    4:31 AM 06/10/2024    9:00 AM  CMP  Glucose 70 - 99 mg/dL 88  892  744   BUN 6 - 20 mg/dL 25  24  16    Creatinine 0.44 - 1.00 mg/dL 9.28  9.20  9.27   Sodium 135 - 145 mmol/L 138  138  139   Potassium 3.5 - 5.1 mmol/L 4.2  4.6  3.3   Chloride 98 - 111 mmol/L 99  101  100   CO2 22 - 32 mmol/L 25  24  21    Calcium 8.9 - 10.3 mg/dL  9.0  8.9  8.7   Total Protein 6.5 - 8.1 g/dL   7.3   Total Bilirubin 0.0 - 1.2 mg/dL   0.3   Alkaline Phos 38 - 126 U/L   185   AST 15 - 41 U/L   31   ALT 0 - 44 U/L   15     Ill-appearing female sitting upright in bed. She is A&O x4, calm and pleasant. She is able to participate in goals of care conversation making her wishes known. Respirations are even and unlabored. She is in no distress.   Reports back/flank pain and chest tightness. Endorses exertional dyspnea. No appetite. She is not sleeping well.   I introduced Palliative Medicine as specialized medical care for people living with serious illness. It focuses on providing relief from the symptoms and stress of a serious illness. The goal is to improve quality of life for both the patient and the family.  We discussed a brief life review of the patient. [name] lives with her significant other, Cathlyn. She has 2 sons and 1 daughter from a previous marriage. She has 5 grandchildren. Before being disabled by interstitial lung disease, she worked in engineering geologist.   As far as functional and nutritional  status, [name] shares that she performs her ADLs independently but is finding this harder to do due to exertional dyspnea. She shares that it would be nice to maybe have someone that could come into her home to help with personal hygiene. She reports no appetite. She states that she usually only eats about once per day.   We discussed patient's current illness and what it means in the larger context of patient's on-going co-morbidities.  Natural disease trajectory and expectations at EOL were discussed. She understands that she has end stage lung disease with no chance of lung/breathing improving.   I attempted to elicit values and goals of care important to the patient. [name] shares her goal is to go home and have someone come into her home to help her with bathing and dressing.   The difference between aggressive medical intervention and comfort care was considered in light of the patient's goals of care.   Advance directives, concepts specific to code status, artificial feeding and hydration, and rehospitalization were considered and discussed. [name] states that she would not want to have CPR or be placed on a ventilator. She would not want a trach or PEG. She confirms if her heart were to stop or she would stop breathing, she would want to die naturally without invasive interventions.  DNR signed and placed in chart and uploaded to [name].  Education offered regarding concept specific to human mortality and the limitations of medical interventions to prolong life when the body begins to fail to thrive.  Family is facing treatment option decisions, advanced directive, and anticipatory care needs. [name] does not have HPOA or living will documentation. She states that it is difficult because her children would want to make decisions, but she wants other family members to make decisions on her behalf. In the event she were not able to make decisions for herself, she requests Donnice Gerlach  (brother) to be her surrogate decision maker and Mercy Gerlach (sister-in-law) to be her second surrogate management consultant. WE discussed the importance of having these wishes named in a legal document called HPOA. Offered spiritual support to assist with completion of her wish for medical decision maker while she is admitted. She refused at this time and states she wants  to think about it for now. Will readdress during visit tomorrow. She shares that she does not want to talk to family about this because she does not want to upset her children.    Discussed with patient/family the importance of continued conversation with family and the medical providers regarding overall plan of care and treatment options, ensuring decisions are within the context of the patients values and GOCs.    Questions and concerns were addressed. The patient was encouraged to call with questions or concerns.   Primary Decision Maker PATIENT  Physical Exam Vitals reviewed.  Constitutional:      General: She is not in acute distress.    Appearance: She is ill-appearing.  HENT:     Head: Normocephalic and atraumatic.     Mouth/Throat:     Mouth: Mucous membranes are moist.  Pulmonary:     Effort: Pulmonary effort is normal. No respiratory distress.     Comments: O2 via [place] Musculoskeletal:     Right lower leg: No edema.     Left lower leg: No edema.  Skin:    General: Skin is warm and dry.  Neurological:     Mental Status: She is alert and oriented to person, place, and time.  Psychiatric:        Mood and Affect: Mood normal.        Behavior: Behavior normal.        Thought Content: Thought content normal.        Judgment: Judgment normal.   Recommendations/Plan:  DNR/DNI- Uploaded to chart via [name], signed and placed in chart Continue current supportive interventions Patient considering completion of HPOA documentation during admission Per TOC, patient does not qualify for [facility]  Ordered rolling walker with  seat for home use Palliative will follow for continuing goals of care conversations   Palliative Assessment/Data: 50%   Discussed plan of care with Dr. Kathrin Rue, RN and [place], TOC.  Thank you for this consult. Palliative medicine will continue to follow and assist holistically.   Time Total: 90 minutes  Time spent includes: Detailed review of medical records (labs, imaging, vital signs), medically appropriate exam (mental status, respiratory, cardiac, skin), discussed with treatment team, counseling and educating patient, family and staff, documenting clinical information, medication management and coordination of care.     Devere Sacks, AMANDA [facility] Palliative Medicine Team  06/12/2024 8:30 AM  Office [phone]  Pager [phone]     Please contact Palliative Medicine Team providers via AMION for questions and concerns.   "

## 2024-06-12 NOTE — Evaluation (Signed)
 Occupational Therapy Evaluation Patient Details Name: Joy Ball MRN: [id number] DOB: 1965/07/22 Today's Date: 06/12/2024   History of Present Illness   58 year old female with past medical history of PE, ILD, chronic hypoxic respiratory failure on 2 L at baseline.  Right upper lobe lung cancer.  S/P  radiation, coronary artery calcification, borderline PAH and prior smoker with a 40 pack/year history reports she quit in 02/2023.     Clinical Impressions Patient presenting with decreased Ind in self care,balance, functional mobility, transfers, endurance, and safety awareness. Patient reports living at home with boyfriend and being Ind with self care tasks and mobility. She has been on 2-6Ls at baseline since July 2025. She checks O2 at home.  Patient currently functioning at supervision- min hand held assist for standing. Unable to progress further as pt's O2 saturation drops to 79% with this task in less than 1 minute of static standing while on 10Ls O2. Pt returning to bed and needing increased time to recover with RN notified.  Patient will benefit from acute OT to increase overall independence in the areas of ADLs, functional mobility, and safety awareness in order to safely discharge.     If plan is discharge home, recommend the following:   A little help with walking and/or transfers;A little help with bathing/dressing/bathroom;Assistance with cooking/housework;Assistance with feeding;Assist for transportation     Functional Status Assessment   Patient has had a recent decline in their functional status and demonstrates the ability to make significant improvements in function in a reasonable and predictable amount of time.     Equipment Recommendations   None recommended by OT      Precautions/Restrictions   Precautions Precautions: None     Mobility Bed Mobility Overal bed mobility: Needs Assistance Bed Mobility: Supine to Sit, Sit to Supine     Supine to  sit: Supervision Sit to supine: Supervision        Transfers Overall transfer level: Needs assistance Equipment used: 1 person hand held assist Transfers: Sit to/from Stand Sit to Stand: Contact guard assist, Supervision                  Balance Overall balance assessment: Needs assistance Sitting-balance support: Feet supported Sitting balance-Leahy Scale: Normal     Standing balance support: No upper extremity supported Standing balance-Leahy Scale: Good                             ADL either performed or assessed with clinical judgement   ADL Overall ADL's : Needs assistance/impaired                                       General ADL Comments: Pt needing assistance secondary to dyspnea on exertion     Vision Patient Visual Report: No change from baseline              Pertinent Vitals/Pain Pain Assessment Pain Assessment: No/denies pain     Extremity/Trunk Assessment Upper Extremity Assessment Upper Extremity Assessment: Overall WFL for tasks assessed;Generalized weakness   Lower Extremity Assessment Lower Extremity Assessment: Overall WFL for tasks assessed;Generalized weakness   Cervical / Trunk Assessment Cervical / Trunk Assessment: Normal   Communication Communication Communication: No apparent difficulties   Cognition Arousal: Alert Behavior During Therapy: WFL for tasks assessed/performed Cognition: No apparent impairments  Following commands: Intact       Cueing  General Comments   Cueing Techniques: Verbal cues              Home Living Family/patient expects to be discharged to:: Private residence Living Arrangements: Spouse/significant other Available Help at Discharge: Family;Available PRN/intermittently Type of Home: House Home Access: Stairs to enter [employer] of Steps: 3 steps down and 2 steps up to door no rails Entrance  Stairs-Rails: None Home Layout: Two level;Able to live on main level with bedroom/bathroom Alternate Level Stairs-Number of Steps: 12 Alternate Level Stairs-Rails: None Bathroom Shower/Tub: Chief Strategy Officer: Standard     Home Equipment: Shower seat;BSC/3in1;Other (comment)   Additional Comments: On 2Ls at rest and 6Ls on exertion since July 2025. She has pulse ox to check at home.      Prior Functioning/Environment Prior Level of Function : Independent/Modified Independent               ADLs Comments: IND in ADLs    OT Problem List: Cardiopulmonary status limiting activity;Decreased safety awareness;Decreased activity tolerance   OT Treatment/Interventions: Self-care/ADL training;Manual therapy;Therapeutic exercise;Patient/family education;Balance training;Energy conservation;Therapeutic activities      OT Goals(Current goals can be found in the care plan section)   Acute Rehab OT Goals Patient Stated Goal: to breathe better and go home OT Goal Formulation: With patient Time For Goal Achievement: 06/26/24 Potential to Achieve Goals: Fair ADL Goals Pt Will Perform Grooming: with modified independence;standing Pt Will Perform Lower Body Dressing: with modified independence;sit to/from stand Pt Will Transfer to Toilet: with modified independence;ambulating Pt Will Perform Toileting - Clothing Manipulation and hygiene: with modified independence;sit to/from stand Additional ADL Goal #1: Pt to be educated on energy conservation strategies for self care tasks and demonstrate during session with min cueing   OT Frequency:  Min 1X/week       AM-PAC OT 6 Clicks Daily Activity     Outcome Measure Help from another person eating meals?: None Help from another person taking care of personal grooming?: None Help from another person toileting, which includes using toliet, bedpan, or urinal?: A Little Help from another person bathing (including washing,  rinsing, drying)?: A Little Help from another person to put on and taking off regular upper body clothing?: None Help from another person to put on and taking off regular lower body clothing?: A Little 6 Click Score: 21   End of Session Nurse Communication: Mobility status;Other (comment) (placed back on 10Ls via [place])  Activity Tolerance: Treatment limited secondary to medical complications (Comment) Patient left: in bed;with call bell/phone within reach  OT Visit Diagnosis: Unsteadiness on feet (R26.81);Muscle weakness (generalized) (M62.81)                Time: 8896-8881 OT Time Calculation (min): 15 min Charges:  OT General Charges $OT Visit: 1 Visit OT Evaluation $OT Eval Low Complexity: 1 Low  Izetta Claude, MS, OTR/L , CBIS ascom [phone]  06/12/2024, 1:32 PM

## 2024-06-12 NOTE — Progress Notes (Signed)
 " PROGRESS NOTE  Joy Ball FMW:[id number] DOB: 1966/02/14   PCP: Edman Marsa PARAS, DO  Patient is from: Home.  Lives with husband.  DOA: 06/09/2024 LOS: 2  Chief complaints Chief Complaint  Patient presents with   Shortness of Breath     Brief Narrative / Interim history: 58 year old F with PMH of PE, ILD, chronic hypoxic RF on 2 L, RUL cancer s/p radiation, coronary artery calcification, borderline PAH and prior smoker with over 40-pack-year history before she quit in 02/2023 presenting with progressive dyspnea despite recent antibiotics and steroid course, and admitted with working diagnosis of LUL pneumonia, COPD/ILD exacerbation and possible pulmonary edema.  Patient was started on 6 L due to saturations dropping into the 60s when EMS arrived.  Received DuoNeb and Solu-Medrol  from EMS  In ED, tachypneic to upper 20s.  Saturating in low 90s on 4 L.  WBC 19.  COVID, influenza and RSV PCR nonreactive.  Serial troponin and proBNP negative.  D-dimer 0.63.  CT angio chest negative for PE but concerning for diffuse interstitial edema and LUL infiltrate.  Patient was started on IV Lasix , Zithromax , ceftriaxone , steroid, scheduled and as needed nebulizers and admitted.  A-20 pathogen RVP and MRSA PCR screen nonreactive.  Remains on antibiotics, steroid, scheduled and as needed nebulizers.  Some improvement in oxygen  requirement.  Pulmonology following.  Subjective: Seen and examined earlier this morning.  Increased oxygen  requirement overnight.  Reports feeling short of breath when she speaks.  Also reports dry cough.  Son and daughter-in-law at bedside.  She talked to pulmonology this morning.  Agrees to palliative consultation as recommended by pulmonology.  She is tearful.    Assessment and plan: Acute on chronic respiratory failure with hypoxia: Multifactorial including ILD/COPD exacerbation, pneumonia, acute heart failure, underlying lung cancer.  Currently saturating in mid 80s  to upper 80s on 6 L.  On 2 L at baseline. CTA chest negative for PE but raises concern for interstitial edema and LUL infiltrate.  Patient appears euvolemic with negative proBNP and cardiac enzymes arguing against pulmonary edema.  Recent TTE in 02/2024 without significant finding.  She also had a nonischemic stress test at the same time.  COVID-19, RVP and MRSA PCR screen negative.  CRP 15 with ESR of 37 favoring acute inflammatory response -Continue ceftriaxone  and Zithromax  for pneumonia -Continue systemic steroid, scheduled and as needed nebulizers -Continue mucolytics and antitussive. -Wean oxygen  as able.  Incentive spirometry -Follow sputum culture-not collected yet. -Appreciate PCCM recs -Palliative consulted.  CODE STATUS changed to DNR    Community-acquired pneumonia COPD exacerbation Interstitial lung disease with exacerbation History of lung cancer s/p radiation -Management as above  Steroid-induced hyperglycemia: A1c 5.1%. -Continue SSI while on steroid  Hypokalemia -Monitor replenish K and Mg as appropriate  Mood disorder: Stable -Continue home meds  Goal of care: Prognosis felt to be poor.  Seen by palliative as recommended by pulmonology.  CODE STATUS changed to DNR.    Body mass index is 21.62 kg/m.          DVT prophylaxis:  enoxaparin  (LOVENOX ) injection 40 mg Start: 06/10/24 0800  Code Status: Full code Family Communication: Updated patient's son and daughter-in-law at bedside. Level of care: Progressive Status is: Inpatient Remains inpatient appropriate because: Acute on chronic respiratory failure with hypoxia, pneumonia, COPD exacerbation and possible ILD exacerbation   Final disposition: Likely home once medically stable   55 minutes with more than 50% spent in reviewing records, counseling patient/family and coordinating care.  Consultants:  Pulmonology Palliative medicine  Procedures: None  Microbiology summarized: COVID-19,  influenza and RSV PCR nonreactive MRSA PCR screen negative 20 pathogen RVP negative Sputum culture pending  Objective: Vitals:   06/12/24 0432 06/12/24 0741 06/12/24 1043 06/12/24 1045  BP:  109/66 100/89   Pulse:  86 99 97  Resp:  20    Temp:  98.1 F (36.7 C)    TempSrc:  Axillary    SpO2: 92% 98% 91% 94%  Weight: 50.2 kg     Height:        Examination:  GENERAL: No apparent distress.  Nontoxic. HEENT: MMM.  Vision and hearing grossly intact.  NECK: Supple.  No apparent JVD.  RESP: No IWOB.    Rhonchi bilateral. CVS:  RRR. Heart sounds normal.  ABD/GI/GU: BS+. Abd soft, NTND.  MSK/EXT:  Moves extremities. No apparent deformity. No edema.  SKIN: no apparent skin lesion or wound NEURO: AA.  Oriented appropriately.  No apparent focal neuro deficit. PSYCH: Understandably tearful  Sch Meds:  Scheduled Meds:  aspirin  EC  81 mg Oral Daily   budesonide  (PULMICORT ) nebulizer solution  0.5 mg Nebulization BID   buPROPion   150 mg Oral BID   enoxaparin  (LOVENOX ) injection  40 mg Subcutaneous Q24H   guaiFENesin   600 mg Oral BID   insulin  aspart  0-15 Units Subcutaneous TID WC   insulin  aspart  0-5 Units Subcutaneous QHS   ipratropium-albuterol   3 mL Nebulization Q4H   methylPREDNISolone  (SOLU-MEDROL ) injection  40 mg Intravenous Q12H   pantoprazole   40 mg Oral Daily   Continuous Infusions:  azithromycin  Stopped (06/12/24 0950)   cefTRIAXone  (ROCEPHIN )  IV Stopped (06/11/24 2356)   PRN Meds:.acetaminophen  **OR** acetaminophen , guaiFENesin -dextromethorphan , HYDROcodone -acetaminophen , ipratropium-albuterol , ondansetron  **OR** ondansetron  (ZOFRAN ) IV, polyethylene glycol, senna-docusate  Antimicrobials: Anti-infectives (From admission, onward)    Start     Dose/Rate Route Frequency Ordered Stop   06/11/24 0100  azithromycin  (ZITHROMAX ) 500 mg in sodium chloride  0.9 % 250 mL IVPB        500 mg 250 mL/hr over 60 Minutes Intravenous Every 24 hours 06/10/24 0255 06/15/24 0059    06/11/24 0000  cefTRIAXone  (ROCEPHIN ) 2 g in sodium chloride  0.9 % 100 mL IVPB        2 g 200 mL/hr over 30 Minutes Intravenous Every 24 hours 06/10/24 0255 06/14/24 2359   06/10/24 0300  cefTRIAXone  (ROCEPHIN ) 1 g in sodium chloride  0.9 % 100 mL IVPB        1 g 200 mL/hr over 30 Minutes Intravenous  Once 06/10/24 0258 06/10/24 0347   06/09/24 2330  cefTRIAXone  (ROCEPHIN ) 1 g in sodium chloride  0.9 % 100 mL IVPB        1 g 200 mL/hr over 30 Minutes Intravenous  Once 06/09/24 2328 06/10/24 0040   06/09/24 2330  azithromycin  (ZITHROMAX ) 500 mg in sodium chloride  0.9 % 250 mL IVPB        500 mg 250 mL/hr over 60 Minutes Intravenous  Once 06/09/24 2328 06/10/24 0145        I have personally reviewed the following labs and images: CBC: Recent Labs  Lab 06/09/24 2312 06/10/24 0900 06/11/24 0431 06/12/24 0418  WBC 19.1* 16.6* 19.0* 14.2*  NEUTROABS 15.3*  --   --   --   HGB 11.8* 12.1 11.8* 12.2  HCT 36.5 37.2 37.4 38.6  MCV 98.4 97.6 99.7 100.5*  PLT 495* 445* 475* 484*   BMP &GFR Recent Labs  Lab 06/09/24 2312 06/10/24 0900 06/11/24  0431 06/12/24 0418  NA 140 139 138 138  K 3.4* 3.3* 4.6 4.2  CL 105 100 101 99  CO2 25 21* 24 25  GLUCOSE 99 255* 107* 88  BUN 24* 16 24* 25*  CREATININE 0.56 0.72 0.79 0.71  CALCIUM 8.8* 8.7* 8.9 9.0  MG  --  2.4 2.5* 2.4  PHOS  --   --  4.0 4.0   Estimated Creatinine Clearance: 55.7 mL/min (by C-G formula based on SCr of 0.71 mg/dL). Liver & Pancreas: Recent Labs  Lab 06/09/24 2312 06/10/24 0900 06/11/24 0431 06/12/24 0418  AST 23 31  --   --   ALT 9 15  --   --   ALKPHOS 171* 185*  --   --   BILITOT 0.3 0.3  --   --   PROT 6.2* 7.3  --   --   ALBUMIN 3.5 3.8 3.6 3.6   No results for input(s): LIPASE, AMYLASE in the last 168 hours. No results for input(s): AMMONIA in the last 168 hours. Diabetic: Recent Labs    06/10/24 1714  HGBA1C 5.1   Recent Labs  Lab 06/11/24 1404 06/11/24 1734 06/11/24 2314  06/12/24 0802 06/12/24 1220  GLUCAP 137* 91 131* 90 115*   Cardiac Enzymes: No results for input(s): CKTOTAL, CKMB, CKMBINDEX, TROPONINI in the last 168 hours. Recent Labs    06/09/24 2330  PROBNP 498.0*   Coagulation Profile: No results for input(s): INR, PROTIME in the last 168 hours. Thyroid  Function Tests: No results for input(s): TSH, T4TOTAL, FREET4, T3FREE, THYROIDAB in the last 72 hours. Lipid Profile: No results for input(s): CHOL, HDL, LDLCALC, TRIG, CHOLHDL, LDLDIRECT in the last 72 hours. Anemia Panel: No results for input(s): VITAMINB12, FOLATE, FERRITIN, TIBC, IRON, RETICCTPCT in the last 72 hours. Urine analysis:    Component Value Date/Time   COLORURINE YELLOW (A) 07/11/2015 2237   APPEARANCEUR CLEAR (A) 07/11/2015 2237   LABSPEC 1.012 07/11/2015 2237   PHURINE 6.0 07/11/2015 2237   GLUCOSEU NEGATIVE 07/11/2015 2237   HGBUR 1+ (A) 07/11/2015 2237   BILIRUBINUR negative 07/27/2015 1028   KETONESUR NEGATIVE 07/11/2015 2237   PROTEINUR negative 07/27/2015 1028   PROTEINUR NEGATIVE 07/11/2015 2237   UROBILINOGEN negative 07/27/2015 1028   NITRITE negative 07/27/2015 1028   NITRITE NEGATIVE 07/11/2015 2237   LEUKOCYTESUR Negative 07/27/2015 1028   Sepsis Labs: Invalid input(s): PROCALCITONIN, LACTICIDVEN  Microbiology: Recent Results (from the past 240 hours)  Resp panel by RT-PCR (RSV, Flu A&B, Covid) Anterior Nasal Swab     Status: None   Collection Time: 06/09/24 11:13 PM   Specimen: Anterior Nasal Swab  Result Value Ref Range Status   SARS Coronavirus 2 by RT PCR NEGATIVE NEGATIVE Final    Comment: (NOTE) SARS-CoV-2 target nucleic acids are NOT DETECTED.  The SARS-CoV-2 RNA is generally detectable in upper respiratory specimens during the acute phase of infection. The lowest concentration of SARS-CoV-2 viral copies this assay can detect is 138 copies/mL. A negative result does not preclude  SARS-Cov-2 infection and should not be used as the sole basis for treatment or other patient management decisions. A negative result may occur with  improper specimen collection/handling, submission of specimen other than nasopharyngeal swab, presence of viral mutation(s) within the areas targeted by this assay, and inadequate number of viral copies(<138 copies/mL). A negative result must be combined with clinical observations, patient history, and epidemiological information. The expected result is Negative.  Fact Sheet for Patients:  bloggercourse.com  Fact Sheet for  Healthcare Providers:  seriousbroker.it  This test is no t yet approved or cleared by the [place]  FDA and  has been authorized for detection and/or diagnosis of SARS-CoV-2 by FDA under an Emergency Use Authorization (EUA). This EUA will remain  in effect (meaning this test can be used) for the duration of the COVID-19 declaration under Section 564(b)(1) of the Act, 21 U.S.C.section 360bbb-3(b)(1), unless the authorization is terminated  or revoked sooner.       Influenza A by PCR NEGATIVE NEGATIVE Final   Influenza B by PCR NEGATIVE NEGATIVE Final    Comment: (NOTE) The Xpert Xpress SARS-CoV-2/FLU/RSV plus assay is intended as an aid in the diagnosis of influenza from Nasopharyngeal swab specimens and should not be used as a sole basis for treatment. Nasal washings and aspirates are unacceptable for Xpert Xpress SARS-CoV-2/FLU/RSV testing.  Fact Sheet for Patients: bloggercourse.com  Fact Sheet for Healthcare Providers: seriousbroker.it  This test is not yet approved or cleared by the [place]  FDA and has been authorized for detection and/or diagnosis of SARS-CoV-2 by FDA under an Emergency Use Authorization (EUA). This EUA will remain in effect (meaning this test can be used) for the duration of  the COVID-19 declaration under Section 564(b)(1) of the Act, 21 U.S.C. section 360bbb-3(b)(1), unless the authorization is terminated or revoked.     Resp Syncytial Virus by PCR NEGATIVE NEGATIVE Final    Comment: (NOTE) Fact Sheet for Patients: bloggercourse.com  Fact Sheet for Healthcare Providers: seriousbroker.it  This test is not yet approved or cleared by the [place]  FDA and has been authorized for detection and/or diagnosis of SARS-CoV-2 by FDA under an Emergency Use Authorization (EUA). This EUA will remain in effect (meaning this test can be used) for the duration of the COVID-19 declaration under Section 564(b)(1) of the Act, 21 U.S.C. section 360bbb-3(b)(1), unless the authorization is terminated or revoked.  Performed at [facility], [address] [place], [place] [address]   MRSA Next Gen by PCR, Nasal     Status: None   Collection Time: 06/10/24  8:13 AM   Specimen: Nasal Mucosa; Nasal Swab  Result Value Ref Range Status   MRSA by PCR Next Gen NOT DETECTED NOT DETECTED Final    Comment: (NOTE) The GeneXpert MRSA Assay (FDA approved for NASAL specimens only), is one component of a comprehensive MRSA colonization surveillance program. It is not intended to diagnose MRSA infection nor to guide or monitor treatment for MRSA infections. Test performance is not FDA approved in patients less than 2 years old. Performed at [facility], [address] [place], [place] [address]   Respiratory (~20 pathogens) panel by PCR     Status: None   Collection Time: 06/10/24  8:13 AM   Specimen: Nasal Mucosa; Respiratory  Result Value Ref Range Status   Adenovirus NOT DETECTED NOT DETECTED Final   Coronavirus 229E NOT DETECTED NOT DETECTED Final    Comment: (NOTE) The Coronavirus on the Respiratory Panel, DOES NOT test for the novel  Coronavirus (2019 nCoV)    Coronavirus HKU1 NOT DETECTED NOT  DETECTED Final   Coronavirus NL63 NOT DETECTED NOT DETECTED Final   Coronavirus OC43 NOT DETECTED NOT DETECTED Final   Metapneumovirus NOT DETECTED NOT DETECTED Final   Rhinovirus / Enterovirus NOT DETECTED NOT DETECTED Final   Influenza A NOT DETECTED NOT DETECTED Final   Influenza B NOT DETECTED NOT DETECTED Final   Parainfluenza Virus 1 NOT DETECTED NOT DETECTED Final  Parainfluenza Virus 2 NOT DETECTED NOT DETECTED Final   Parainfluenza Virus 3 NOT DETECTED NOT DETECTED Final   Parainfluenza Virus 4 NOT DETECTED NOT DETECTED Final   Respiratory Syncytial Virus NOT DETECTED NOT DETECTED Final   Bordetella pertussis NOT DETECTED NOT DETECTED Final   Bordetella Parapertussis NOT DETECTED NOT DETECTED Final   Chlamydophila pneumoniae NOT DETECTED NOT DETECTED Final   Mycoplasma pneumoniae NOT DETECTED NOT DETECTED Final    Comment: Performed at [facility], [address] [place], [place] [address]    Radiology Studies: DG Chest 1 View Result Date: 06/11/2024 EXAM: 1 VIEW(S) XRAY OF THE CHEST 06/11/2024 08:34:42 PM COMPARISON: 06/09/2024 CLINICAL HISTORY: SOB (shortness of breath) FINDINGS: LUNGS AND PLEURA: Stable lower lung predominant interstitial opacities. Biapical pleural thickening and scarring. No pleural effusion. No pneumothorax. HEART AND MEDIASTINUM: No acute abnormality of the cardiac and mediastinal silhouettes. BONES AND SOFT TISSUES: No acute osseous abnormality. IMPRESSION: 1. Stable lower lung predominant interstitial opacities and biapical pleural thickening and scarring. Electronically signed by: Oneil Devonshire MD 06/11/2024 08:40 PM EST RP Workstation: [id number] Simmer T. Delayla Hoffmaster [facility]  If 7PM-7AM, please contact night-coverage [email or web address] 06/12/2024, 1:38 PM   "

## 2024-06-12 NOTE — Consult Note (Signed)
 " [facility] Consultation      Date: 06/12/2024,   MRN# [id number] Joy Ball Nov 04, 1965       CHIEF COMPLAINT:   SOB   HISTORY OF PRESENT ILLNESS   58 year old female with past medical history of PE, ILD, chronic hypoxic respiratory failure on 2 L at baseline.  Right upper lobe lung cancer.  S/P  radiation, coronary artery calcification, borderline PAH and prior smoker with a 40 pack/year history reports she quit in 02/2023.    Who presented with progressive dyspnea despite recent antibiotics and steroid course, and admitted with working diagnosis of left  pneumonia, COPD, ILD exacerbation with possible pulmonary edema.  Patient was started on 6 L due to saturations dropping into the 60s when EMS arrived.  Received a DuoNeb and Solu-Medrol  from EMS.  In the emergency department she was tachypneic up to the 20s saturating in the low 90s on 4 L.    White blood cell count 19.  COVID influenza and RSV negative.  Serial troponins and proBNP negative.  D-dimer 0.63.    CT angio chest negative for PE but concerning for diffuse interstitial edema and left upper lung infiltrate.  Patient was started on IV Lasix , Zithromax , ceftriaxone , steroid, scheduled and as needed nebulizers and admitted.   PCCM CONSULTED   PAST MEDICAL HISTORY   Past Medical History:  Diagnosis Date   Allergy Idk   Arthritis Idk   Asthma    Cancer (HCC)    Cataract Idk   COPD (chronic obstructive pulmonary disease) (HCC)    Dyspnea    Emphysema lung (HCC)    Emphysema of lung (HCC) Idk   GERD (gastroesophageal reflux disease)    Lung cancer (HCC) 2024   Lung nodule    Migraines    Pulmonary fibrosis (HCC)    Seasonal allergies      SURGICAL HISTORY   Past Surgical History:  Procedure Laterality Date   CESAREAN SECTION     EYE SURGERY Bilateral 01/2023   RIGHT HEART CATH Right 04/26/2024   Procedure: RIGHT HEART CATH;  Surgeon: Mady Bruckner, MD;  Location: [facility] INVASIVE  CV LAB;  Service: Cardiovascular;  Laterality: Right;   TUBAL LIGATION       FAMILY HISTORY   Family History  Problem Relation Age of Onset   Lung cancer Father        was a smoker   Cancer Father    Heart disease Maternal Grandfather    Emphysema Mother    Arthritis Mother    Asthma Mother    COPD Mother    Vision loss Mother    Varicose Veins Mother    Miscarriages / Stillbirths Sister    Miscarriages / Stillbirths Sister      SOCIAL HISTORY   Social History[1]   MEDICATIONS    Home Medication:    Current Medication: Current Medications[2]    ALLERGIES   Amoxicillin   BP 101/69 (BP Location: Left Arm)   Pulse 80   Temp (!) 97.4 F (36.3 C) (Axillary)   Resp 15   Ht 5' (1.524 m)   Wt 50.2 kg   SpO2 92%   BMI 21.62 kg/m    Review of Systems: Gen:  Denies  fever, sweats, chills weight loss  HEENT: Denies blurred vision, double vision, ear pain, eye pain, hearing loss, nose bleeds, sore throat Cardiac:  No dizziness, chest pain or heaviness, chest tightness,edema, No JVD Resp:   + cough, +sputum production, +shortness  of breath,-wheezing, -hemoptysis,  Other:  All other systems negative   Physical Examination:   General Appearance: No distress  EYES PERRLA, EOM intact.   NECK Supple, No JVD Pulmonary: normal breath sounds, +rhonchi CardiovascularNormal S1,S2.  No m/r/g.   Abdomen: Benign, Soft, non-tender. Neurology UE/LE 5/5 strength, no focal deficits Ext pulses intact, cap refill intact ALL OTHER ROS ARE NEGATIVE      IMAGING    DG Chest 1 View Result Date: 06/11/2024 EXAM: 1 VIEW(S) XRAY OF THE CHEST 06/11/2024 08:34:42 PM COMPARISON: 06/09/2024 CLINICAL HISTORY: SOB (shortness of breath) FINDINGS: LUNGS AND PLEURA: Stable lower lung predominant interstitial opacities. Biapical pleural thickening and scarring. No pleural effusion. No pneumothorax. HEART AND MEDIASTINUM: No acute abnormality of the cardiac and mediastinal  silhouettes. BONES AND SOFT TISSUES: No acute osseous abnormality. IMPRESSION: 1. Stable lower lung predominant interstitial opacities and biapical pleural thickening and scarring. Electronically signed by: Oneil Devonshire MD 06/11/2024 08:40 PM EST RP Workstation: [id number]   CT Angio Chest PE W/Cm &/Or Wo Cm Result Date: 06/10/2024 EXAM: CTA of the Chest with contrast for PE 06/10/2024 02:06:34 AM TECHNIQUE: CTA of the chest was performed without and with the administration of 75 mL of intravenous iohexol  (OMNIPAQUE ) 350 MG/ML injection. Multiplanar reformatted images are provided for review. MIP images are provided for review. Automated exposure control, iterative reconstruction, and/or weight based adjustment of the mA/kV was utilized to reduce the radiation dose to as low as reasonably achievable. COMPARISON: 02/12/2024 plain film from the previous day. SABRA CLINICAL HISTORY: Shortness of breath. FINDINGS: PULMONARY ARTERIES: Pulmonary arteries are adequately opacified for evaluation. The pulmonary artery shows a normal branching pattern. No filling defect to suggest pulmonary embolism is noted. Main pulmonary artery is normal in caliber. MEDIASTINUM: The heart is at the upper limits of normal in size. Mild coronary calcifications are seen. Thoracic aorta shows atherosclerotic calcifications without an aneurysmal dilatation. The esophagus is within normal limits. LYMPH NODES: Scattered mediastinal lymph nodes are identified and stable from the prior exam. These are likely reactive in nature. The largest of these lies in the subcarinal region measuring up to 15 mm. No hilar or axillary lymphadenopathy. LUNGS AND PLEURA: The lungs are well aerated bilaterally. Bronchiectatic changes are seen. Diffuse emphysematous changes are noted. Right apical scarring is again seen, consistent with the patient's given clinical history and likely post-irradiation therapy. Infiltrative changes are seen in the lateral aspect of the  left upper lobe new when compared with the prior study, likely representing acute infiltrate. Diffuse interstitial edema is noted particularly in the lower lobes bilaterally. No pleural effusion or pneumothorax. UPPER ABDOMEN: The visualized upper abdomen is within normal limits. SOFT TISSUES AND BONES: No acute bone or soft tissue abnormality. No bony abnormality is noted. IMPRESSION: 1. No evidence of pulmonary embolism. 2. Diffuse interstitial edema, particularly in the lower lobes bilaterally. No effusion. 3. Likely acute infiltrate in the lateral aspect of the left upper lobe. 4. Chronic changes in the right apex. Electronically signed by: Oneil Devonshire MD 06/10/2024 02:16 AM EST RP Workstation: [id number]   DG Chest Port 1 View Result Date: 06/09/2024 EXAM: 1 VIEW(S) XRAY OF THE CHEST 06/09/2024 11:30:46 PM COMPARISON: Chest x-ray 05/31/2024 and chest CT 02/12/2024. CLINICAL HISTORY: shob copd FINDINGS: LUNGS AND PLEURA: Stable right upper lobe parenchymal opacity. Stable strandy and patchy opacities in the peripheral left upper lobe. Chronic interstitial opacities in the lung bases compatible with fibrosis as seen on the prior CT. No new focal lung infiltrate. No  pleural effusion. No pneumothorax. HEART AND MEDIASTINUM: No acute abnormality of the cardiac and mediastinal silhouettes. BONES AND SOFT TISSUES: No acute osseous abnormality. IMPRESSION: 1. Stable bilateral upper lobes parenchymal opacities. 2. Chronic bibasilar interstitial opacities consistent with fibrosis. 3. No new focal lung infiltrate, pleural effusion, or pneumothorax. Electronically signed by: Greig Pique MD 06/09/2024 11:33 PM EST RP Workstation: [id number]   CT ABDOMEN PELVIS W CONTRAST Result Date: 05/31/2024 CLINICAL DATA:  Right flank and upper quadrant pain EXAM: CT ABDOMEN AND PELVIS WITH CONTRAST TECHNIQUE: Multidetector CT imaging of the abdomen and pelvis was performed using the standard protocol following bolus  administration of intravenous contrast. RADIATION DOSE REDUCTION: This exam was performed according to the departmental dose-optimization program which includes automated exposure control, adjustment of the mA and/or kV according to patient size and/or use of iterative reconstruction technique. CONTRAST:  OMNIPAQUE  IOHEXOL  300 MG/ML  SOLN COMPARISON:  Chest x-ray 05/31/2024, PET CT 07/31/2023, CT 09/25/2014 FINDINGS: Lower chest: Lung bases demonstrate fibrotic lung disease. No acute airspace disease. Hepatobiliary: No focal liver abnormality is seen. No gallstones, gallbladder wall thickening, or biliary dilatation. Pancreas: Unremarkable. No pancreatic ductal dilatation or surrounding inflammatory changes. Spleen: Normal in size without focal abnormality. Adrenals/Urinary Tract: Adrenal glands are unremarkable. Kidneys are normal, without renal calculi, focal lesion, or hydronephrosis. Bladder is unremarkable. Stomach/Bowel: Stomach is within normal limits. Appendix appears normal. No evidence of bowel wall thickening, distention, or inflammatory changes. Vascular/Lymphatic: Aortic atherosclerosis. No enlarged abdominal or pelvic lymph nodes. Reproductive: Uterus and bilateral adnexa are unremarkable. Other: Negative for ascites or free air Musculoskeletal: No acute or suspicious osseous abnormality IMPRESSION: 1. No CT evidence for acute intra-abdominal or pelvic abnormality. 2. Fibrotic lung disease at the bases. 3. Aortic atherosclerosis. Aortic Atherosclerosis (ICD10-I70.0). Electronically Signed   By: Luke Bun M.D.   On: 05/31/2024 16:52   DG Chest 2 View Result Date: 05/31/2024 CLINICAL DATA:  Right-sided chest and right upper quadrant abdominal pain for 2 weeks. History of pulmonary fibrosis. History of lung cancer with SBRT earlier this year. EXAM: CHEST - 2 VIEW COMPARISON:  Chest radiographs 05/26/2024 and 12/29/2023. Chest CT 02/12/2024. FINDINGS: Compared with the radiographs of 5 days  ago, there has been no significant change in the superior hilar retraction, architectural distortion and biapical scarring. These findings are progressive from older prior studies and have been attributed to radiation therapy on CT. Underlying severe interstitial lung disease without new airspace disease, pleural effusion or pneumothorax. The heart size and mediastinal contours are stable. No acute osseous findings are evident. IMPRESSION: 1. No acute cardiopulmonary process or significant change from recent radiographs. 2. Progressive superior hilar retraction and biapical scarring, attributed to radiation therapy. Grossly stable underlying chronic interstitial lung disease. Electronically Signed   By: Elsie Perone M.D.   On: 05/31/2024 15:12   DG Chest 2 View Result Date: 05/26/2024 EXAM: 2 VIEW(S) XRAY OF THE CHEST 05/26/2024 11:01:21 AM COMPARISON: 12/29/2023 CLINICAL HISTORY: SOB, productive cough, pleurisy FINDINGS: LUNGS AND PLEURA: Bilateral upper lobe opacities concerning for possible pneumonia or scarring. Possible small loculated pleural effusion seen in the right upper lobe. No pneumothorax. HEART AND MEDIASTINUM: No acute abnormality of the cardiac and mediastinal silhouettes. BONES AND SOFT TISSUES: No acute osseous abnormality. IMPRESSION: 1. Bilateral upper lobe opacities suspicious for pneumonia versus scarring. 2. Possible small loculated right upper lobe pleural effusion. Electronically signed by: Lynwood Seip MD 05/26/2024 11:39 AM EST RP Workstation: [id number]      ASSESSMENT/PLAN   58 yo white  female with end stage lung disease COPD/ILD with pneumonia and COPD exacerbation  SEVERE COPD EXACERBATION -continue IV steroids as prescribed -continue NEB THERAPY as prescribed -morphine  as needed -wean fio2 as needed and tolerated Continue IV abx Check urine strep pneumonia and legionella O2 sat goal >88 Consider palliative are GOC   MEDICATION ADJUSTMENTS/LABS AND TESTS  ORDERED:    CURRENT MEDICATIONS REVIEWED AT LENGTH WITH PATIENT TODAY   Patient  satisfied with Plan of action and management. All questions answered   I spent a total of 75 minutes dedicated to the care of this patient on the date of this encounter to include pre-visit review of records, face-to-face time with the patient discussing conditions above, post visit ordering of testing, clinical documentation with the electronic health record, making appropriate referrals as documented, and communicating necessary information to the patient's healthcare team.    The Patient requires high complexity decision making for assessment and support, frequent evaluation and titration of therapies, application of advanced monitoring technologies and extensive interpretation of multiple databases.  Patient satisfied with Plan of action and management. All questions answered    Nickolas Alm Cellar, M.D.  [facility]  Medical Director [facility] [place]                 [1]  Social History Tobacco Use   Smoking status: Former    Current packs/day: 0.00    Average packs/day: 1.5 packs/day for 81.7 years (121.3 ttl pk-yrs)    Types: Cigarettes    Start date: 51    Quit date: 02/22/2023    Years since quitting: 1.3   Smokeless tobacco: Never   Tobacco comments:    Stopped in September of 2024 .hfb RN  Vaping Use   Vaping status: Never Used  Substance Use Topics   Alcohol use: Yes    Alcohol/week: 0.0 standard drinks of alcohol    Comment: occ   Drug use: No  [2]  Current Facility-Administered Medications:    acetaminophen  (TYLENOL ) tablet 650 mg, 650 mg, Oral, Q6H PRN **OR** acetaminophen  (TYLENOL ) suppository 650 mg, 650 mg, Rectal, Q6H PRN, Cleatus Delayne GAILS, MD   aspirin  EC tablet 81 mg, 81 mg, Oral, Daily, Cleatus Delayne V, MD, 81 mg at 06/11/24 1038   azithromycin  (ZITHROMAX ) 500 mg in sodium chloride  0.9 % 250 mL IVPB, 500 mg, Intravenous, Q24H,  Cleatus Delayne GAILS, MD, Last Rate: 250 mL/hr at 06/12/24 0116, 500 mg at 06/12/24 0116   budesonide  (PULMICORT ) nebulizer solution 0.5 mg, 0.5 mg, Nebulization, BID, Julene Rahn, MD, 0.5 mg at 06/12/24 9275   buPROPion  (WELLBUTRIN  SR) 12 hr tablet 150 mg, 150 mg, Oral, BID, Cleatus Delayne V, MD, 150 mg at 06/11/24 2318   cefTRIAXone  (ROCEPHIN ) 2 g in sodium chloride  0.9 % 100 mL IVPB, 2 g, Intravenous, Q24H, Cleatus Delayne V, MD, Last Rate: 200 mL/hr at 06/11/24 2326, 2 g at 06/11/24 2326   enoxaparin  (LOVENOX ) injection 40 mg, 40 mg, Subcutaneous, Q24H, Cleatus Delayne V, MD, 40 mg at 06/11/24 1039   guaiFENesin  (MUCINEX ) 12 hr tablet 600 mg, 600 mg, Oral, BID, Cleatus Delayne V, MD, 600 mg at 06/11/24 2318   guaiFENesin -dextromethorphan  (ROBITUSSIN DM) 100-10 MG/5ML syrup 10 mL, 10 mL, Oral, Q4H PRN, Cleatus Delayne GAILS, MD, 10 mL at 06/10/24 2227   HYDROcodone -acetaminophen  (NORCO/VICODIN) 5-325 MG per tablet 1-2 tablet, 1-2 tablet, Oral, Q4H PRN, Cleatus Delayne GAILS, MD, 2 tablet at 06/11/24 1939   insulin  aspart (novoLOG ) injection 0-15 Units, 0-15  Units, Subcutaneous, TID WC, Gonfa, Taye T, MD, 2 Units at 06/11/24 1414   insulin  aspart (novoLOG ) injection 0-5 Units, 0-5 Units, Subcutaneous, QHS, Gonfa, Taye T, MD   ipratropium-albuterol  (DUONEB) 0.5-2.5 (3) MG/3ML nebulizer solution 3 mL, 3 mL, Nebulization, TID, Gonfa, Taye T, MD, 3 mL at 06/12/24 0724   ipratropium-albuterol  (DUONEB) 0.5-2.5 (3) MG/3ML nebulizer solution 3 mL, 3 mL, Nebulization, Q3H PRN, Gonfa, Taye T, MD   methylPREDNISolone  sodium succinate (SOLU-MEDROL ) 40 mg/mL injection 40 mg, 40 mg, Intravenous, Q12H, Nahuel Wilbert, MD, 40 mg at 06/12/24 9396   ondansetron  (ZOFRAN ) tablet 4 mg, 4 mg, Oral, Q6H PRN **OR** ondansetron  (ZOFRAN ) injection 4 mg, 4 mg, Intravenous, Q6H PRN, Cleatus Delayne GAILS, MD   pantoprazole  (PROTONIX ) EC tablet 40 mg, 40 mg, Oral, Daily, Cleatus Delayne V, MD, 40 mg at 06/11/24 1039  "

## 2024-06-13 DIAGNOSIS — I509 Heart failure, unspecified: Secondary | ICD-10-CM | POA: Diagnosis not present

## 2024-06-13 DIAGNOSIS — Z87891 Personal history of nicotine dependence: Secondary | ICD-10-CM

## 2024-06-13 DIAGNOSIS — J849 Interstitial pulmonary disease, unspecified: Secondary | ICD-10-CM | POA: Diagnosis not present

## 2024-06-13 DIAGNOSIS — C3490 Malignant neoplasm of unspecified part of unspecified bronchus or lung: Secondary | ICD-10-CM

## 2024-06-13 DIAGNOSIS — J441 Chronic obstructive pulmonary disease with (acute) exacerbation: Secondary | ICD-10-CM | POA: Diagnosis not present

## 2024-06-13 DIAGNOSIS — J9621 Acute and chronic respiratory failure with hypoxia: Secondary | ICD-10-CM | POA: Diagnosis not present

## 2024-06-13 DIAGNOSIS — Z515 Encounter for palliative care: Secondary | ICD-10-CM | POA: Diagnosis not present

## 2024-06-13 DIAGNOSIS — Z66 Do not resuscitate: Secondary | ICD-10-CM | POA: Diagnosis not present

## 2024-06-13 DIAGNOSIS — J189 Pneumonia, unspecified organism: Secondary | ICD-10-CM | POA: Diagnosis not present

## 2024-06-13 DIAGNOSIS — J8410 Pulmonary fibrosis, unspecified: Secondary | ICD-10-CM | POA: Diagnosis not present

## 2024-06-13 LAB — CBC
HCT: 40.9 % (ref 36.0–46.0)
Hemoglobin: 12.4 g/dL (ref 12.0–15.0)
MCH: 31.1 pg (ref 26.0–34.0)
MCHC: 30.3 g/dL (ref 30.0–36.0)
MCV: 102.5 fL — ABNORMAL HIGH (ref 80.0–100.0)
Platelets: 476 K/uL — ABNORMAL HIGH (ref 150–400)
RBC: 3.99 MIL/uL (ref 3.87–5.11)
RDW: 14.2 % (ref 11.5–15.5)
WBC: 12.7 K/uL — ABNORMAL HIGH (ref 4.0–10.5)
nRBC: 0 % (ref 0.0–0.2)

## 2024-06-13 LAB — MAGNESIUM: Magnesium: 2.6 mg/dL — ABNORMAL HIGH (ref 1.7–2.4)

## 2024-06-13 LAB — RENAL FUNCTION PANEL
Albumin: 3.8 g/dL (ref 3.5–5.0)
Anion gap: 15 (ref 5–15)
BUN: 28 mg/dL — ABNORMAL HIGH (ref 6–20)
CO2: 20 mmol/L — ABNORMAL LOW (ref 22–32)
Calcium: 9.1 mg/dL (ref 8.9–10.3)
Chloride: 101 mmol/L (ref 98–111)
Creatinine, Ser: 0.71 mg/dL (ref 0.44–1.00)
GFR, Estimated: >60 mL/min
Glucose, Bld: 112 mg/dL — ABNORMAL HIGH (ref 70–99)
Phosphorus: 4.1 mg/dL (ref 2.5–4.6)
Potassium: 4.4 mmol/L (ref 3.5–5.1)
Sodium: 136 mmol/L (ref 135–145)

## 2024-06-13 LAB — GLUCOSE, CAPILLARY
Glucose-Capillary: 76 mg/dL (ref 70–99)
Glucose-Capillary: 98 mg/dL (ref 70–99)
Glucose-Capillary: 102 mg/dL — ABNORMAL HIGH (ref 70–99)
Glucose-Capillary: 102 mg/dL — ABNORMAL HIGH (ref 70–99)
Glucose-Capillary: 167 mg/dL — ABNORMAL HIGH (ref 70–99)

## 2024-06-13 LAB — SEDIMENTATION RATE: Sed Rate: 19 mm/h (ref 0–30)

## 2024-06-13 LAB — C-REACTIVE PROTEIN: CRP: <3 mg/dL — ABNORMAL HIGH

## 2024-06-13 MED ORDER — IPRATROPIUM-ALBUTEROL 0.5-2.5 (3) MG/3ML IN SOLN
3.0000 mL | Freq: Three times a day (TID) | RESPIRATORY_TRACT | Status: DC
  Administered 2024-06-13 – 2024-06-15 (×6): 3 mL via RESPIRATORY_TRACT
  Filled 2024-06-13 (×6): qty 3

## 2024-06-13 NOTE — Progress Notes (Signed)
 "                                                                                                                                                                                               Palliative Care Progress Note, Assessment & Plan   Patient Name: Joy Ball       Date: 06/13/2024 DOB: Nov 12, 1965  Age: 58 y.o. MRN#: [id number] Attending Physician: Kathrin Mignon DASEN, MD Primary Care Physician: Edman Marsa PARAS, DO Admit Date: 06/09/2024  Subjective: Reports back/flank pain 7/10, 4/10 with pain medication.  Patient was able to eat a small amount of lunch today.  She states she is sleeping better with the pain meds but is hesitant to continue taking narcotics.  She feels her breathing is better today as well.  HPI: 58 y.o. female  with past medical history significant for PE, end stage ILD, chronic hypoxic respiratory failure on 2L home O2, RUL lung cancer s/p radiation, coronary artery calcification, borderline pulmonary arterail hypertension ans prior tobacco smoker (40 pack year, quit 02/2023). Patient presented to ED 06/09/2024 c/o shortness of breath with increased O2 requirement to 4-6 L to maintain 90% and chest tightness.    ED workup resulted K+ 3.4, BUN 24, calcium 8.8, Alk phos 171. Leukocytosis at 19.1, Hgb 11.8, plts 495. BNP 498 and D-dimer 0.63.  ED vitals 135/81, HR 92, SpO2 95% 5L, RR 28, 98.63F   CXR demonstrated  1. Stable bilateral upper lobes parenchymal opacities. 2. Chronic bibasilar interstitial opacities consistent with fibrosis. 3. No new focal lung infiltrate, pleural effusion, or pneumothorax.   CTA Chest showed 1. No evidence of pulmonary embolism. 2. Diffuse interstitial edema, particularly in the lower lobes bilaterally. No effusion. 3. Likely acute infiltrate in the lateral aspect of the left upper lobe. 4. Chronic changes in the right apex.   TRH was consulted for admission and management of acute and chronic respiratory failure with hypoxia,  ILD/COPD exacerbation, PNA, acute heart failure and underlying lung cancer.    Palliative medicine team was consulted for assistance with goals of care conversations.   Summary of counseling/coordination of care: Extensive chart review completed prior to meeting patient including labs, vital signs, imaging, progress notes, orders, and available advanced directive documents from current and previous encounters.   After reviewing the patient's chart and assessing the patient at bedside, I spoke with patient and family in regards to symptom management and goals of care.    Latest Ref Rng & Units 06/13/2024    4:54 AM 06/12/2024    4:18 AM 06/11/2024    4:31 AM  CBC  WBC  4.0 - 10.5 K/uL 12.7  14.2  19.0   Hemoglobin 12.0 - 15.0 g/dL 87.5  87.7  88.1   Hematocrit 36.0 - 46.0 % 40.9  38.6  37.4   Platelets 150 - 400 K/uL 476  484  475       Latest Ref Rng & Units 06/13/2024    4:54 AM 06/12/2024    4:18 AM 06/11/2024    4:31 AM  CMP  Glucose 70 - 99 mg/dL 887  88  892   BUN 6 - 20 mg/dL 28  25  24    Creatinine 0.44 - 1.00 mg/dL 9.28  9.28  9.20   Sodium 135 - 145 mmol/L 136  138  138   Potassium 3.5 - 5.1 mmol/L 4.4  4.2  4.6   Chloride 98 - 111 mmol/L 101  99  101   CO2 22 - 32 mmol/L 20  25  24    Calcium 8.9 - 10.3 mg/dL 9.1  9.0  8.9       Ill-appearing female sitting upright in bed with significant other, sister-in-law and niece at bedside.  She is alert and oriented x 4.  Noted mild dyspnea with conversation.  She is in no distress.  Discussed that patient should have walker seat delivered to assist her with ambulation in her home when she is discharged.  When asked about completing H POA paperwork she states she is still unsure at this time.  Patient discussed DNR with family and became tearful because that is not what her children wants but she endorses not wanting CPR or ventilator support.  She will be glad when pneumonia clears and she is able to go home.  Therapeutic  silence and active listening provided for patient and family to share their thoughts and emotions regarding current medical situation.  Emotional support provided.  Physical Exam Vitals reviewed.  Constitutional:      General: She is not in acute distress.    Appearance: She is ill-appearing.  HENT:     Head: Normocephalic and atraumatic.     Nose:     Comments: 6 L O2 via [place]    Mouth/Throat:     Mouth: Mucous membranes are moist.  Pulmonary:     Effort: Pulmonary effort is normal. No respiratory distress.     Comments: Mild conversational dyspnea Musculoskeletal:     Right lower leg: No edema.     Left lower leg: No edema.  Skin:    General: Skin is warm and dry.  Neurological:     Mental Status: She is alert and oriented to person, place, and time.  Psychiatric:        Mood and Affect: Mood normal.        Behavior: Behavior normal.        Thought Content: Thought content normal.        Judgment: Judgment normal.    Recommendations/Plan: DNR/DNI Continue current supportive interventions Patient considering completion of HPOA documentation during admission Per TOC, patient does not qualify for [facility]  Ordered rolling walker with seat for home use Palliative will follow for continuing goals of care conversations          Total Time 50 minutes   Time spent includes: Detailed review of medical records (labs, imaging, vital signs), medically appropriate exam (mental status, respiratory, cardiac, skin), discussed with treatment team, counseling and educating patient, family and staff, documenting clinical information, medication management and coordination of care.     Devere Sacks,  AMANDA [facility] Palliative Medicine Team  06/13/2024 11:01 AM  Office [phone]  Pager [phone]     "

## 2024-06-13 NOTE — Plan of Care (Addendum)
 This patient remains on AR-2A as of time of writing. The patient is still requiring 8 L / min of supplemental O2 via HFNC. The patient remains on telemetry monitoring. This patient's admission profile was completed overnight by this RN.    Problem: Education: Goal: Knowledge of the prescribed therapeutic regimen will improve Outcome: Progressing   Problem: Activity: Goal: Ability to implement measures to reduce episodes of fatigue will improve Outcome: Progressing   Problem: Bowel/Gastric: Goal: Will not experience complications related to bowel motility Outcome: Progressing   Problem: Coping: Goal: Ability to identify and develop effective coping behavior will improve Outcome: Progressing   Problem: Nutritional: Goal: Maintenance of adequate nutrition will improve Outcome: Progressing

## 2024-06-13 NOTE — Progress Notes (Signed)
 " PROGRESS NOTE  Joy Ball FMW:[id number] DOB: November 27, 1965   PCP: Edman Marsa PARAS, DO  Patient is from: Home.  Lives with husband.  DOA: 06/09/2024 LOS: 3  Chief complaints Chief Complaint  Patient presents with   Shortness of Breath     Brief Narrative / Interim history: 58 year old F with PMH of PE, ILD, chronic hypoxic RF on 2 L, RUL cancer s/p radiation, coronary artery calcification, borderline PAH and prior smoker with over 40-pack-year history before she quit in 02/2023 presenting with progressive dyspnea despite recent antibiotics and steroid course, and admitted with working diagnosis of LUL pneumonia, COPD/ILD exacerbation and possible pulmonary edema.  Patient was started on 6 L due to saturations dropping into the 60s when EMS arrived.  Received DuoNeb and Solu-Medrol  from EMS  In ED, tachypneic to upper 20s.  Saturating in low 90s on 4 L.  WBC 19.  COVID, influenza and RSV PCR nonreactive.  Serial troponin and proBNP negative.  D-dimer 0.63.  CT angio chest negative for PE but concerning for diffuse interstitial edema and LUL infiltrate.  Patient was started on IV Lasix , Zithromax , ceftriaxone , steroid, scheduled and as needed nebulizers and admitted.  A-20 pathogen RVP and MRSA PCR screen nonreactive.  Still with significant oxygen  requirement despite antibiotics, steroid, scheduled and as needed nebulizers. Pulmonology and palliative on board.  Subjective: Seen and examined earlier this morning.  No major events overnight of this morning.  Continues to require 8 L by nasal cannula.  Reports feeling short of breath with speaking and eating. also cough with clear phlegm at times.  Denies chest pain.   Assessment and plan: Acute on chronic respiratory failure with hypoxia: Multifactorial including ILD/COPD exacerbation, pneumonia, acute heart failure, underlying lung cancer.  Currently saturating in upper 90s on 8 L by [place].  CTA chest negative for PE but raises  concern for interstitial edema and LUL infiltrate.  Patient appears euvolemic with negative proBNP and cardiac enzymes arguing against pulmonary edema.  Recent TTE in 02/2024 without significant finding.  She also had a nonischemic stress test at the same time.  COVID-19, RVP and MRSA PCR screen negative.  CRP 15 with ESR of 37 favoring acute inflammatory response -Continue ceftriaxone  and Zithromax  for pneumonia -Continue systemic steroid, scheduled and as needed nebulizers -Continue mucolytics and antitussive. -Wean oxygen  as able.  Incentive spirometry.  Goal saturation 88% with minimum oxygen . -Sputum culture ordered but not able to provide sample. -Appreciate PCCM and palliative recs.  Now DNR.    Community-acquired pneumonia COPD exacerbation Interstitial lung disease with exacerbation History of lung cancer s/p radiation -Management as above  Steroid-induced hyperglycemia: A1c 5.1%. -Continue SSI while on steroid  Hypokalemia -Monitor replenish K and Mg as appropriate  Mood disorder: Stable -Continue home meds  Goal of care: Prognosis felt to be poor.  Seen by palliative as recommended by pulmonology.  CODE STATUS changed to DNR.    Body mass index is 21.48 kg/m.          DVT prophylaxis:  enoxaparin  (LOVENOX ) injection 40 mg Start: 06/10/24 0800  Code Status: DNR Family Communication: None at bedside. Level of care: Progressive Status is: Inpatient Remains inpatient appropriate because: Acute on chronic respiratory failure with hypoxia, pneumonia, COPD exacerbation and possible ILD exacerbation   Final disposition: Likely home once medically stable   55 minutes with more than 50% spent in reviewing records, counseling patient/family and coordinating care.  Consultants:  Pulmonology Palliative medicine  Procedures: None  Microbiology summarized:  COVID-19, influenza and RSV PCR nonreactive MRSA PCR screen negative 20 pathogen RVP negative Sputum  culture pending  Objective: Vitals:   06/13/24 0100 06/13/24 0500 06/13/24 0731 06/13/24 0754  BP: 100/75     Pulse: 83     Resp: 16     Temp:    98.4 F (36.9 C)  TempSrc:    Oral  SpO2: 98%  99%   Weight:  49.9 kg    Height:        Examination:  GENERAL: No apparent distress.  Nontoxic. HEENT: MMM.  Vision and hearing grossly intact.  NECK: Supple.  No apparent JVD.  RESP: No IWOB.    Rhonchi bilateral. CVS:  RRR. Heart sounds normal.  ABD/GI/GU: BS+. Abd soft, NTND.  MSK/EXT:  Moves extremities. No apparent deformity. No edema.  SKIN: no apparent skin lesion or wound NEURO: AA.  Oriented appropriately.  No apparent focal neuro deficit. PSYCH: Understandably tearful  Sch Meds:  Scheduled Meds:  aspirin  EC  81 mg Oral Daily   budesonide  (PULMICORT ) nebulizer solution  0.5 mg Nebulization BID   buPROPion   150 mg Oral BID   enoxaparin  (LOVENOX ) injection  40 mg Subcutaneous Q24H   guaiFENesin   600 mg Oral BID   insulin  aspart  0-15 Units Subcutaneous TID WC   insulin  aspart  0-5 Units Subcutaneous QHS   ipratropium-albuterol   3 mL Nebulization TID   methylPREDNISolone  (SOLU-MEDROL ) injection  40 mg Intravenous Q12H   pantoprazole   40 mg Oral Daily   Continuous Infusions:  sodium chloride  Stopped (06/13/24 0018)   azithromycin  250 mL/hr at 06/13/24 0049   cefTRIAXone  (ROCEPHIN )  IV Stopped (06/12/24 2347)   PRN Meds:.sodium chloride , acetaminophen  **OR** acetaminophen , guaiFENesin -dextromethorphan , HYDROcodone -acetaminophen , ipratropium-albuterol , ondansetron  **OR** ondansetron  (ZOFRAN ) IV, polyethylene glycol, senna-docusate  Antimicrobials: Anti-infectives (From admission, onward)    Start     Dose/Rate Route Frequency Ordered Stop   06/11/24 0100  azithromycin  (ZITHROMAX ) 500 mg in sodium chloride  0.9 % 250 mL IVPB        500 mg 250 mL/hr over 60 Minutes Intravenous Every 24 hours 06/10/24 0255 06/15/24 0059   06/11/24 0000  cefTRIAXone  (ROCEPHIN ) 2 g in  sodium chloride  0.9 % 100 mL IVPB        2 g 200 mL/hr over 30 Minutes Intravenous Every 24 hours 06/10/24 0255 06/14/24 2359   06/10/24 0300  cefTRIAXone  (ROCEPHIN ) 1 g in sodium chloride  0.9 % 100 mL IVPB        1 g 200 mL/hr over 30 Minutes Intravenous  Once 06/10/24 0258 06/10/24 0347   06/09/24 2330  cefTRIAXone  (ROCEPHIN ) 1 g in sodium chloride  0.9 % 100 mL IVPB        1 g 200 mL/hr over 30 Minutes Intravenous  Once 06/09/24 2328 06/10/24 0040   06/09/24 2330  azithromycin  (ZITHROMAX ) 500 mg in sodium chloride  0.9 % 250 mL IVPB        500 mg 250 mL/hr over 60 Minutes Intravenous  Once 06/09/24 2328 06/10/24 0145        I have personally reviewed the following labs and images: CBC: Recent Labs  Lab 06/09/24 2312 06/10/24 0900 06/11/24 0431 06/12/24 0418 06/13/24 0454  WBC 19.1* 16.6* 19.0* 14.2* 12.7*  NEUTROABS 15.3*  --   --   --   --   HGB 11.8* 12.1 11.8* 12.2 12.4  HCT 36.5 37.2 37.4 38.6 40.9  MCV 98.4 97.6 99.7 100.5* 102.5*  PLT 495* 445* 475* 484* 476*   BMP &  GFR Recent Labs  Lab 06/09/24 2312 06/10/24 0900 06/11/24 0431 06/12/24 0418 06/13/24 0454  NA 140 139 138 138 136  K 3.4* 3.3* 4.6 4.2 4.4  CL 105 100 101 99 101  CO2 25 21* 24 25 20*  GLUCOSE 99 255* 107* 88 112*  BUN 24* 16 24* 25* 28*  CREATININE 0.56 0.72 0.79 0.71 0.71  CALCIUM 8.8* 8.7* 8.9 9.0 9.1  MG  --  2.4 2.5* 2.4 2.6*  PHOS  --   --  4.0 4.0 4.1   Estimated Creatinine Clearance: 55.7 mL/min (by C-G formula based on SCr of 0.71 mg/dL). Liver & Pancreas: Recent Labs  Lab 06/09/24 2312 06/10/24 0900 06/11/24 0431 06/12/24 0418 06/13/24 0454  AST 23 31  --   --   --   ALT 9 15  --   --   --   ALKPHOS 171* 185*  --   --   --   BILITOT 0.3 0.3  --   --   --   PROT 6.2* 7.3  --   --   --   ALBUMIN 3.5 3.8 3.6 3.6 3.8   No results for input(s): LIPASE, AMYLASE in the last 168 hours. No results for input(s): AMMONIA in the last 168 hours. Diabetic: Recent Labs     06/10/24 1714  HGBA1C 5.1   Recent Labs  Lab 06/12/24 1623 06/12/24 2038 06/13/24 0750 06/13/24 0834 06/13/24 1156  GLUCAP 97 138* 76 98 102*   Cardiac Enzymes: No results for input(s): CKTOTAL, CKMB, CKMBINDEX, TROPONINI in the last 168 hours. Recent Labs    06/09/24 2330  PROBNP 498.0*   Coagulation Profile: No results for input(s): INR, PROTIME in the last 168 hours. Thyroid  Function Tests: No results for input(s): TSH, T4TOTAL, FREET4, T3FREE, THYROIDAB in the last 72 hours. Lipid Profile: No results for input(s): CHOL, HDL, LDLCALC, TRIG, CHOLHDL, LDLDIRECT in the last 72 hours. Anemia Panel: No results for input(s): VITAMINB12, FOLATE, FERRITIN, TIBC, IRON, RETICCTPCT in the last 72 hours. Urine analysis:    Component Value Date/Time   COLORURINE YELLOW (A) 07/11/2015 2237   APPEARANCEUR CLEAR (A) 07/11/2015 2237   LABSPEC 1.012 07/11/2015 2237   PHURINE 6.0 07/11/2015 2237   GLUCOSEU NEGATIVE 07/11/2015 2237   HGBUR 1+ (A) 07/11/2015 2237   BILIRUBINUR negative 07/27/2015 1028   KETONESUR NEGATIVE 07/11/2015 2237   PROTEINUR negative 07/27/2015 1028   PROTEINUR NEGATIVE 07/11/2015 2237   UROBILINOGEN negative 07/27/2015 1028   NITRITE negative 07/27/2015 1028   NITRITE NEGATIVE 07/11/2015 2237   LEUKOCYTESUR Negative 07/27/2015 1028   Sepsis Labs: Invalid input(s): PROCALCITONIN, LACTICIDVEN  Microbiology: Recent Results (from the past 240 hours)  Resp panel by RT-PCR (RSV, Flu A&B, Covid) Anterior Nasal Swab     Status: None   Collection Time: 06/09/24 11:13 PM   Specimen: Anterior Nasal Swab  Result Value Ref Range Status   SARS Coronavirus 2 by RT PCR NEGATIVE NEGATIVE Final    Comment: (NOTE) SARS-CoV-2 target nucleic acids are NOT DETECTED.  The SARS-CoV-2 RNA is generally detectable in upper respiratory specimens during the acute phase of infection. The lowest concentration of SARS-CoV-2 viral  copies this assay can detect is 138 copies/mL. A negative result does not preclude SARS-Cov-2 infection and should not be used as the sole basis for treatment or other patient management decisions. A negative result may occur with  improper specimen collection/handling, submission of specimen other than nasopharyngeal swab, presence of viral mutation(s) within the areas targeted  by this assay, and inadequate number of viral copies(<138 copies/mL). A negative result must be combined with clinical observations, patient history, and epidemiological information. The expected result is Negative.  Fact Sheet for Patients:  bloggercourse.com  Fact Sheet for Healthcare Providers:  seriousbroker.it  This test is no t yet approved or cleared by the [place]  FDA and  has been authorized for detection and/or diagnosis of SARS-CoV-2 by FDA under an Emergency Use Authorization (EUA). This EUA will remain  in effect (meaning this test can be used) for the duration of the COVID-19 declaration under Section 564(b)(1) of the Act, 21 U.S.C.section 360bbb-3(b)(1), unless the authorization is terminated  or revoked sooner.       Influenza A by PCR NEGATIVE NEGATIVE Final   Influenza B by PCR NEGATIVE NEGATIVE Final    Comment: (NOTE) The Xpert Xpress SARS-CoV-2/FLU/RSV plus assay is intended as an aid in the diagnosis of influenza from Nasopharyngeal swab specimens and should not be used as a sole basis for treatment. Nasal washings and aspirates are unacceptable for Xpert Xpress SARS-CoV-2/FLU/RSV testing.  Fact Sheet for Patients: bloggercourse.com  Fact Sheet for Healthcare Providers: seriousbroker.it  This test is not yet approved or cleared by the [place]  FDA and has been authorized for detection and/or diagnosis of SARS-CoV-2 by FDA under an Emergency Use Authorization (EUA). This  EUA will remain in effect (meaning this test can be used) for the duration of the COVID-19 declaration under Section 564(b)(1) of the Act, 21 U.S.C. section 360bbb-3(b)(1), unless the authorization is terminated or revoked.     Resp Syncytial Virus by PCR NEGATIVE NEGATIVE Final    Comment: (NOTE) Fact Sheet for Patients: bloggercourse.com  Fact Sheet for Healthcare Providers: seriousbroker.it  This test is not yet approved or cleared by the [place]  FDA and has been authorized for detection and/or diagnosis of SARS-CoV-2 by FDA under an Emergency Use Authorization (EUA). This EUA will remain in effect (meaning this test can be used) for the duration of the COVID-19 declaration under Section 564(b)(1) of the Act, 21 U.S.C. section 360bbb-3(b)(1), unless the authorization is terminated or revoked.  Performed at [facility], [address] [place], [place] [address]   MRSA Next Gen by PCR, Nasal     Status: None   Collection Time: 06/10/24  8:13 AM   Specimen: Nasal Mucosa; Nasal Swab  Result Value Ref Range Status   MRSA by PCR Next Gen NOT DETECTED NOT DETECTED Final    Comment: (NOTE) The GeneXpert MRSA Assay (FDA approved for NASAL specimens only), is one component of a comprehensive MRSA colonization surveillance program. It is not intended to diagnose MRSA infection nor to guide or monitor treatment for MRSA infections. Test performance is not FDA approved in patients less than 76 years old. Performed at [facility], [address] [place], [place] [address]   Respiratory (~20 pathogens) panel by PCR     Status: None   Collection Time: 06/10/24  8:13 AM   Specimen: Nasal Mucosa; Respiratory  Result Value Ref Range Status   Adenovirus NOT DETECTED NOT DETECTED Final   Coronavirus 229E NOT DETECTED NOT DETECTED Final    Comment: (NOTE) The Coronavirus on the Respiratory Panel, DOES NOT test  for the novel  Coronavirus (2019 nCoV)    Coronavirus HKU1 NOT DETECTED NOT DETECTED Final   Coronavirus NL63 NOT DETECTED NOT DETECTED Final   Coronavirus OC43 NOT DETECTED NOT DETECTED Final   Metapneumovirus NOT DETECTED NOT DETECTED Final  Rhinovirus / Enterovirus NOT DETECTED NOT DETECTED Final   Influenza A NOT DETECTED NOT DETECTED Final   Influenza B NOT DETECTED NOT DETECTED Final   Parainfluenza Virus 1 NOT DETECTED NOT DETECTED Final   Parainfluenza Virus 2 NOT DETECTED NOT DETECTED Final   Parainfluenza Virus 3 NOT DETECTED NOT DETECTED Final   Parainfluenza Virus 4 NOT DETECTED NOT DETECTED Final   Respiratory Syncytial Virus NOT DETECTED NOT DETECTED Final   Bordetella pertussis NOT DETECTED NOT DETECTED Final   Bordetella Parapertussis NOT DETECTED NOT DETECTED Final   Chlamydophila pneumoniae NOT DETECTED NOT DETECTED Final   Mycoplasma pneumoniae NOT DETECTED NOT DETECTED Final    Comment: Performed at [facility], [address] [place], [place] [address]    Radiology Studies: No results found.      Aaniyah Strohm T. Shahad Mazurek [facility]  If 7PM-7AM, please contact night-coverage [email or web address] 06/13/2024, 12:32 PM   "

## 2024-06-13 NOTE — Progress Notes (Signed)
 Heart Failure Navigator Progress Note Assessed for Heart & Vascular TOC clinic readiness. This patient admission seems to be for Acute on Chronic Respiratory Failure with Hypoxia, Pneumonia and History of Lung Cancer.  Navigator available for reassessment of patient but will sign off at this time.   Charmaine Pines, RN, BSN [facility] Heart Failure Navigator Secure Chat Only

## 2024-06-13 NOTE — Plan of Care (Signed)
 Vital signs remained stable. 4L HFNC. Family at bedside this shift. External catheter removed and patient promoted to ambulate. Patient does desaturate significantly when exerting herself. Required 15L to maintain above 88%.  Problem: Education: Goal: Ability to describe self-care measures that may prevent or decrease complications (Diabetes Survival Skills Education) will improve Outcome: Progressing Goal: Individualized Educational Video(s) Outcome: Progressing   Problem: Coping: Goal: Ability to adjust to condition or change in health will improve Outcome: Progressing   Problem: Fluid Volume: Goal: Ability to maintain a balanced intake and output will improve Outcome: Progressing   Problem: Health Behavior/Discharge Planning: Goal: Ability to identify and utilize available resources and services will improve Outcome: Progressing Goal: Ability to manage health-related needs will improve Outcome: Progressing   Problem: Metabolic: Goal: Ability to maintain appropriate glucose levels will improve Outcome: Progressing   Problem: Nutritional: Goal: Maintenance of adequate nutrition will improve Outcome: Progressing Goal: Progress toward achieving an optimal weight will improve Outcome: Progressing   Problem: Skin Integrity: Goal: Risk for impaired skin integrity will decrease Outcome: Progressing   Problem: Tissue Perfusion: Goal: Adequacy of tissue perfusion will improve Outcome: Progressing   Problem: Education: Goal: Knowledge of General Education information will improve Description: Including pain rating scale, medication(s)/side effects and non-pharmacologic comfort measures Outcome: Progressing   Problem: Health Behavior/Discharge Planning: Goal: Ability to manage health-related needs will improve Outcome: Progressing   Problem: Clinical Measurements: Goal: Ability to maintain clinical measurements within normal limits will improve Outcome: Progressing Goal:  Will remain free from infection Outcome: Progressing Goal: Diagnostic test results will improve Outcome: Progressing Goal: Respiratory complications will improve Outcome: Progressing Goal: Cardiovascular complication will be avoided Outcome: Progressing   Problem: Activity: Goal: Risk for activity intolerance will decrease Outcome: Progressing   Problem: Nutrition: Goal: Adequate nutrition will be maintained Outcome: Progressing   Problem: Coping: Goal: Level of anxiety will decrease Outcome: Progressing   Problem: Elimination: Goal: Will not experience complications related to bowel motility Outcome: Progressing Goal: Will not experience complications related to urinary retention Outcome: Progressing   Problem: Pain Managment: Goal: General experience of comfort will improve and/or be controlled Outcome: Progressing   Problem: Safety: Goal: Ability to remain free from injury will improve Outcome: Progressing   Problem: Skin Integrity: Goal: Risk for impaired skin integrity will decrease Outcome: Progressing   Problem: Education: Goal: Ability to demonstrate management of disease process will improve Outcome: Progressing Goal: Ability to verbalize understanding of medication therapies will improve Outcome: Progressing Goal: Individualized Educational Video(s) Outcome: Progressing   Problem: Activity: Goal: Capacity to carry out activities will improve Outcome: Progressing   Problem: Cardiac: Goal: Ability to achieve and maintain adequate cardiopulmonary perfusion will improve Outcome: Progressing   Problem: Education: Goal: Knowledge of disease or condition will improve Outcome: Progressing Goal: Knowledge of the prescribed therapeutic regimen will improve Outcome: Progressing Goal: Individualized Educational Video(s) Outcome: Progressing   Problem: Activity: Goal: Ability to tolerate increased activity will improve Outcome: Progressing Goal: Will  verbalize the importance of balancing activity with adequate rest periods Outcome: Progressing   Problem: Respiratory: Goal: Ability to maintain a clear airway will improve Outcome: Progressing Goal: Levels of oxygenation will improve Outcome: Progressing Goal: Ability to maintain adequate ventilation will improve Outcome: Progressing   Problem: Activity: Goal: Ability to tolerate increased activity will improve Outcome: Progressing   Problem: Clinical Measurements: Goal: Ability to maintain a body temperature in the normal range will improve Outcome: Progressing   Problem: Respiratory: Goal: Ability to maintain adequate ventilation will improve  Outcome: Progressing Goal: Ability to maintain a clear airway will improve Outcome: Progressing   Problem: Education: Goal: Knowledge of the prescribed therapeutic regimen will improve Outcome: Progressing   Problem: Activity: Goal: Ability to implement measures to reduce episodes of fatigue will improve Outcome: Progressing   Problem: Bowel/Gastric: Goal: Will not experience complications related to bowel motility Outcome: Progressing

## 2024-06-13 NOTE — TOC Progression Note (Signed)
 Transition of Care [facility]) - Progression Note    Patient Details  Name: Joy Ball MRN: [id number] Date of Birth: 1965/06/30  [facility] CM/SW Contact  Shasta DELENA Daring, RN Phone Number: 06/13/2024, 5:12 PM  Clinical Narrative:    Initiated search for [facility] service. Will follow.   Expected Discharge Plan: Home/Self Care Barriers to Discharge: Continued Medical Work up               Expected Discharge Plan and Services     Post Acute Care Choice: NA Living arrangements for the past 2 months: Single Family Home                                       Social Drivers of Health (SDOH) Interventions SDOH Screenings   Food Insecurity: Food Insecurity Present (06/11/2024)  Housing: Low Risk (06/11/2024)  Transportation Needs: No Transportation Needs (06/11/2024)  Utilities: Not At Risk (06/11/2024)  Alcohol Screen: Low Risk (11/11/2022)  Depression (PHQ2-9): Low Risk (02/25/2024)  Social Connections: Unknown (12/22/2022)   Received from [facility]  Tobacco Use: Medium Risk (06/09/2024)  Health Literacy: Adequate Health Literacy (08/19/2023)    Readmission Risk Interventions    06/10/2024    4:13 PM  Readmission Risk Prevention Plan  Transportation Screening Complete  PCP or Specialist Appt within 3-5 Days Complete  Social Work Consult for Recovery Care Planning/Counseling Complete  Palliative Care Screening Not Applicable  Medication Review Oceanographer) Complete

## 2024-06-13 NOTE — Progress Notes (Signed)
 Occupational Therapy Treatment Patient Details Name: Joy Ball MRN: [id number] DOB: 01-20-1966 Today's Date: 06/13/2024   History of present illness 58 year old female with past medical history of PE, ILD, chronic hypoxic respiratory failure on 2 L at baseline.  Right upper lobe lung cancer.  S/P  radiation, coronary artery calcification, borderline PAH and prior smoker with a 40 pack/year history reports she quit in 02/2023.   OT comments  Upon entering the room, pt supine in bed and agreeable to OT intervention. Pt currently on 8Ls O2 via [place]. Pt reports recently getting out of bed to get onto [facility] for toileting need but staff has been turning up oxygen  to help me. Pt is supported by [facility] being elevated and demonstrated incentive spirometer x 5 reps with min cues for proper technique. OT provided paper handout for energy conservation techniques related to all self care tasks and discussed with pt how to perform. She was active participant in discussion. O2 saturation decreased to 86% just with voicing and needing cues for pursed lip breathing to return to 90% or better. Call bell and all needed items within reach.       If plan is discharge home, recommend the following:  A little help with walking and/or transfers;A little help with bathing/dressing/bathroom;Assistance with cooking/housework;Assistance with feeding;Assist for transportation   Equipment Recommendations  None recommended by OT       Precautions / Restrictions Precautions Precautions: None              ADL either performed or assessed with clinical judgement    Extremity/Trunk Assessment Upper Extremity Assessment Upper Extremity Assessment: Generalized weakness   Lower Extremity Assessment Lower Extremity Assessment: Generalized weakness        Vision Patient Visual Report: No change from baseline           Communication Communication Communication: No apparent difficulties   Cognition Arousal:  Alert Behavior During Therapy: WFL for tasks assessed/performed Cognition: No apparent impairments                               Following commands: Intact        Cueing   Cueing Techniques: Verbal cues             Pertinent Vitals/ Pain       Pain Assessment Pain Assessment: No/denies pain     Prior Functioning/Environment              Frequency  Min 2X/week        Progress Toward Goals  OT Goals(current goals can now be found in the care plan section)  Progress towards OT goals: Progressing toward goals      AM-PAC OT 6 Clicks Daily Activity     Outcome Measure   Help from another person eating meals?: None Help from another person taking care of personal grooming?: None Help from another person toileting, which includes using toliet, bedpan, or urinal?: A Little   Help from another person to put on and taking off regular upper body clothing?: None Help from another person to put on and taking off regular lower body clothing?: A Little 6 Click Score: 18    End of Session    OT Visit Diagnosis: Unsteadiness on feet (R26.81);Muscle weakness (generalized) (M62.81)   Activity Tolerance Patient tolerated treatment well   Patient Left in bed;with call bell/phone within reach;with bed alarm set   Nurse Communication Mobility status  Time: [id number] OT Time Calculation (min): 25 min  Charges: OT General Charges $OT Visit: 1 Visit OT Treatments $Self Care/Home Management : 8-22 mins $Therapeutic Activity: 8-22 mins  Izetta Claude, MS, OTR/L , CBIS ascom [phone]  06/13/2024, 2:57 PM

## 2024-06-14 DIAGNOSIS — R0602 Shortness of breath: Secondary | ICD-10-CM | POA: Diagnosis not present

## 2024-06-14 DIAGNOSIS — J9621 Acute and chronic respiratory failure with hypoxia: Secondary | ICD-10-CM | POA: Diagnosis not present

## 2024-06-14 LAB — GLUCOSE, CAPILLARY
Glucose-Capillary: 114 mg/dL — ABNORMAL HIGH (ref 70–99)
Glucose-Capillary: 110 mg/dL — ABNORMAL HIGH (ref 70–99)
Glucose-Capillary: 114 mg/dL — ABNORMAL HIGH (ref 70–99)
Glucose-Capillary: 127 mg/dL — ABNORMAL HIGH (ref 70–99)

## 2024-06-14 NOTE — TOC Progression Note (Addendum)
 Transition of Care [facility]) - Progression Note    Patient Details  Name: Joy Ball MRN: [id number] Date of Birth: August 14, 1965  [facility]) CM/SW Contact  Lauraine JAYSON Carpen, LCSW Phone Number: 06/14/2024, 11:34 AM  Clinical Narrative:  Unable to secure home health. Patient is aware. CSW encouraged her to ask her PCP for a home health or outpatient therapy referral once she has reestablished care with him.   1:00 pm: Per MD, potential discharge tomorrow or Thursday. CSW ordered rollator through [employer].  2:34 pm: Per PT, family and significant other asked about getting a wheelchair as well but if not, they still want to get the rollator. [employer] confirmed insurance will not pay for both so we will move forward with rollator. Encouraged patient to check the hospice thrift store for wheelchair if needed.  Expected Discharge Plan: Home/Self Care Barriers to Discharge: Continued Medical Work up               Expected Discharge Plan and Services     Post Acute Care Choice: NA Living arrangements for the past 2 months: Single Family Home                                       Social Drivers of Health (SDOH) Interventions SDOH Screenings   Food Insecurity: Food Insecurity Present (06/11/2024)  Housing: Low Risk (06/11/2024)  Transportation Needs: No Transportation Needs (06/11/2024)  Utilities: Not At Risk (06/11/2024)  Alcohol Screen: Low Risk (11/11/2022)  Depression (PHQ2-9): Low Risk (02/25/2024)  Social Connections: Unknown (12/22/2022)   Received from [facility]  Tobacco Use: Medium Risk (06/09/2024)  Health Literacy: Adequate Health Literacy (08/19/2023)    Readmission Risk Interventions    06/10/2024    4:13 PM  Readmission Risk Prevention Plan  Transportation Screening Complete  PCP or Specialist Appt within 3-5 Days Complete  Social Work Consult for Recovery Care Planning/Counseling Complete  Palliative Care Screening Not Applicable  Medication  Review [employer]) Complete

## 2024-06-14 NOTE — Progress Notes (Signed)
 PT Cancellation Note  Patient Details Name: Joy Ball MRN: [id number] DOB: [birth year]   Cancelled Treatment:     PT attempt. Pt politely refused.  I just really don't feel like it right now. Even when I just get up to the [facility] I get extremely SOB.  Reviewed dx and clinical presentation. Pt remains nice but still unwilling for OOB activity. Did discuss post acute needs. Pt requesting W/C in addition to her rollator for longer community distances. MD/TOC made aware of pt's request.   Joy Ball 06/14/2024, 1:58 PM

## 2024-06-14 NOTE — Progress Notes (Signed)
" °   06/14/24 1500  Spiritual Encounters  Type of Visit Initial  Care provided to: Pt and family (Significant Other at bedside)  Referral source Nurse (RN/NT/LPN)  Reason for visit Advance directives  OnCall Visit No  Spiritual Framework  Presenting Themes Goals in life/care;Impactful experiences and emotions  Interventions  Spiritual Care Interventions Made Established relationship of care and support;Compassionate presence;Reflective listening;Normalization of emotions;Meaning making  Intervention Outcomes  Outcomes Connection to spiritual care;Awareness around self/spiritual resourses;Connection to values and goals of care;Autonomy/agency;Awareness of support  Advance Directives (For Healthcare)  Does Patient Have a Medical Advance Directive? No  Would patient like information on creating a medical advance directive? Yes (Inpatient - patient defers creating a medical advance directive at this time - Information given) (Pt will have nurse page a chaplain when she's ready to sign)    "

## 2024-06-14 NOTE — Progress Notes (Signed)
 [facility]  - [facility]   PATIENT NAME: Joy Ball    MR#:  [id number]  DATE OF BIRTH:  1965/12/09  SUBJECTIVE:  seen earlier. No family at bedside. Patient sitting up in the bed. Able to talk full sentences without getting short of breath. She has desaturation on minimal exertion. Discussed and patient is aware of her lung damage.    VITALS:  Blood pressure 118/80, pulse 79, temperature 98.4 F (36.9 C), temperature source Oral, resp. rate 20, height 5' (1.524 m), weight 50 kg, SpO2 90%.  PHYSICAL EXAMINATION:   GENERAL:  58 y.o.-year-old patient with no acute distress. Frail LUNGS: decreased breath sounds bilaterally, no wheezing CARDIOVASCULAR: S1, S2 normal. No murmur   ABDOMEN: Soft, nontender, nondistended.  NEUROLOGIC: nonfocal  patient is alert and awake SKIN: No obvious rash, lesion, or ulcer.   LABORATORY PANEL:  CBC Recent Labs  Lab 06/13/24 0454  WBC 12.7*  HGB 12.4  HCT 40.9  PLT 476*    Chemistries  Recent Labs  Lab 06/10/24 0900 06/11/24 0431 06/13/24 0454  NA 139   < > 136  K 3.3*   < > 4.4  CL 100   < > 101  CO2 21*   < > 20*  GLUCOSE 255*   < > 112*  BUN 16   < > 28*  CREATININE 0.72   < > 0.71  CALCIUM 8.7*   < > 9.1  MG 2.4   < > 2.6*  AST 31  --   --   ALT 15  --   --   ALKPHOS 185*  --   --   BILITOT 0.3  --   --    < > = values in this interval not displayed.   Assessment and Plan  58 year old F with PMH of PE, ILD, chronic hypoxic RF on 2 L, RUL cancer s/p radiation, coronary artery calcification, borderline PAH and prior smoker with over 40-pack-year history before she quit in 02/2023 presenting with progressive dyspnea despite recent antibiotics and steroid course, and admitted with working diagnosis of LUL pneumonia, COPD/ILD exacerbation and possible pulmonary edema. Patient was started on 6 L due to saturations dropping into the 60s when EMS arrived.   20 pathogen RVP and MRSA PCR screen  nonreactive.     Acute on chronic respiratory failure with hypoxia: Multifactorial including ILD/COPD exacerbation, pneumonia, acute heart failure, underlying lung cancer.Significant lung damage -- currently on 4 L nasal cannula oxygen  sats 9299%. Desatswith exertion. --CTA chest negative for PE but raises concern for interstitial edema and LUL infiltrate.   --Patient appears euvolemic with negative proBNP and cardiac enzymes arguing against pulmonary edema. --  Recent TTE in 02/2024 without significant finding.  She also had a nonischemic stress test at the same time.   --COVID-19, RVP and MRSA PCR screen negative.   --CRP 15 with ESR of 37 favoring acute inflammatory response -completed ceftriaxone  and Zithromax  for pneumonia -Continue systemic steroid, scheduled and as needed nebulizers-- will give along taper -Continue mucolytics and antitussive. -Wean oxygen  as able.  Incentive spirometry.  Goal saturation 88% with minimum oxygen . -Sputum culture ordered but not able to provide sample. -Appreciate PCCM and palliative recs.  Now DNR. -- Patient follows with Dr. Geronimo in [facility] pulmonary -- overall poor prognosis given extent of lung chronic changes   Community-acquired pneumonia COPD exacerbation Interstitial lung disease with exacerbation History of lung cancer s/p radiation -Management as above   Steroid-induced  hyperglycemia: A1c 5.1%. -Continue SSI while on steroid   Hypokalemia -Monitor replenish K and Mg as appropriate   Mood disorder: Stable -Continue home meds   Goal of care: Prognosis felt to be poor.  Seen by palliative as recommended by pulmonology.  CODE STATUS changed to DNR.     Body mass index is 21.48 kg/m.   DVT prophylaxis:  enoxaparin  (LOVENOX ) injection 40 mg Start: 06/10/24 0800   Code Status: DNR Family Communication: None at bedside. Level of care: Progressive  If remains stable will discharge tomorrow later in the day.  Patient wants to stay till Christmas.      TOTAL TIME TAKING CARE OF THIS PATIENT: 35 minutes.  >50% time spent on counselling and coordination of care  Note: This dictation was prepared with Dragon dictation along with smaller phrase technology. Any transcriptional errors that result from this process are unintentional.  Leita Blanch M.D    [facility]   CC: Primary care physician; Edman Marsa PARAS, DO

## 2024-06-14 NOTE — Plan of Care (Signed)
 " Problem: Education: Goal: Ability to describe self-care measures that may prevent or decrease complications (Diabetes Survival Skills Education) will improve Outcome: Progressing Goal: Individualized Educational Video(s) Outcome: Progressing   Problem: Coping: Goal: Ability to adjust to condition or change in health will improve Outcome: Progressing   Problem: Fluid Volume: Goal: Ability to maintain a balanced intake and output will improve Outcome: Progressing   Problem: Health Behavior/Discharge Planning: Goal: Ability to identify and utilize available resources and services will improve Outcome: Progressing Goal: Ability to manage health-related needs will improve Outcome: Progressing   Problem: Metabolic: Goal: Ability to maintain appropriate glucose levels will improve Outcome: Progressing   Problem: Nutritional: Goal: Maintenance of adequate nutrition will improve Outcome: Progressing Goal: Progress toward achieving an optimal weight will improve Outcome: Progressing   Problem: Skin Integrity: Goal: Risk for impaired skin integrity will decrease Outcome: Progressing   Problem: Tissue Perfusion: Goal: Adequacy of tissue perfusion will improve Outcome: Progressing   Problem: Education: Goal: Knowledge of General Education information will improve Description: Including pain rating scale, medication(s)/side effects and non-pharmacologic comfort measures Outcome: Progressing   Problem: Health Behavior/Discharge Planning: Goal: Ability to manage health-related needs will improve Outcome: Progressing   Problem: Clinical Measurements: Goal: Ability to maintain clinical measurements within normal limits will improve Outcome: Progressing Goal: Will remain free from infection Outcome: Progressing Goal: Diagnostic test results will improve Outcome: Progressing Goal: Respiratory complications will improve Outcome: Progressing Goal: Cardiovascular complication will  be avoided Outcome: Progressing   Problem: Activity: Goal: Risk for activity intolerance will decrease Outcome: Progressing   Problem: Nutrition: Goal: Adequate nutrition will be maintained Outcome: Progressing   Problem: Coping: Goal: Level of anxiety will decrease Outcome: Progressing   Problem: Elimination: Goal: Will not experience complications related to bowel motility Outcome: Progressing Goal: Will not experience complications related to urinary retention Outcome: Progressing   Problem: Pain Managment: Goal: General experience of comfort will improve and/or be controlled Outcome: Progressing   Problem: Safety: Goal: Ability to remain free from injury will improve Outcome: Progressing   Problem: Skin Integrity: Goal: Risk for impaired skin integrity will decrease Outcome: Progressing   Problem: Education: Goal: Ability to demonstrate management of disease process will improve Outcome: Progressing Goal: Ability to verbalize understanding of medication therapies will improve Outcome: Progressing Goal: Individualized Educational Video(s) Outcome: Progressing   Problem: Activity: Goal: Capacity to carry out activities will improve Outcome: Progressing   Problem: Cardiac: Goal: Ability to achieve and maintain adequate cardiopulmonary perfusion will improve Outcome: Progressing   Problem: Education: Goal: Knowledge of disease or condition will improve Outcome: Progressing Goal: Knowledge of the prescribed therapeutic regimen will improve Outcome: Progressing Goal: Individualized Educational Video(s) Outcome: Progressing   Problem: Activity: Goal: Ability to tolerate increased activity will improve Outcome: Progressing Goal: Will verbalize the importance of balancing activity with adequate rest periods Outcome: Progressing   Problem: Respiratory: Goal: Ability to maintain a clear airway will improve Outcome: Progressing Goal: Levels of oxygenation  will improve Outcome: Progressing Goal: Ability to maintain adequate ventilation will improve Outcome: Progressing   Problem: Activity: Goal: Ability to tolerate increased activity will improve Outcome: Progressing   Problem: Clinical Measurements: Goal: Ability to maintain a body temperature in the normal range will improve Outcome: Progressing   Problem: Respiratory: Goal: Ability to maintain adequate ventilation will improve Outcome: Progressing Goal: Ability to maintain a clear airway will improve Outcome: Progressing   Problem: Education: Goal: Knowledge of the prescribed therapeutic regimen will improve Outcome: Progressing   Problem: Activity:  Goal: Ability to implement measures to reduce episodes of fatigue will improve Outcome: Progressing   Problem: Bowel/Gastric: Goal: Will not experience complications related to bowel motility Outcome: Progressing   "

## 2024-06-15 ENCOUNTER — Other Ambulatory Visit: Payer: Self-pay

## 2024-06-15 DIAGNOSIS — J9621 Acute and chronic respiratory failure with hypoxia: Secondary | ICD-10-CM | POA: Diagnosis not present

## 2024-06-15 LAB — GLUCOSE, CAPILLARY
Glucose-Capillary: 116 mg/dL — ABNORMAL HIGH (ref 70–99)
Glucose-Capillary: 94 mg/dL (ref 70–99)

## 2024-06-15 MED ORDER — PREDNISONE 20 MG PO TABS
40.0000 mg | ORAL_TABLET | Freq: Every day | ORAL | 0 refills | Status: DC
  Filled 2024-06-15: qty 6, 3d supply, fill #0

## 2024-06-15 MED ORDER — IPRATROPIUM-ALBUTEROL 0.5-2.5 (3) MG/3ML IN SOLN: 3.0000 mL | Freq: Two times a day (BID) | RESPIRATORY_TRACT | Status: DC

## 2024-06-15 MED ORDER — PREDNISONE 10 MG PO TABS
ORAL_TABLET | ORAL | 0 refills | Status: AC
  Filled 2024-06-15: qty 15, 5d supply, fill #0

## 2024-06-15 MED ORDER — PREDNISONE 20 MG PO TABS: 40.0000 mg | ORAL_TABLET | Freq: Every day | ORAL | Status: DC

## 2024-06-15 MED ORDER — DEXTROMETHORPHAN-GUAIFENESIN 20-200 MG/20ML PO LIQD
20.0000 mL | ORAL | 0 refills | Status: DC | PRN
  Filled 2024-06-15: qty 118, 1d supply, fill #0

## 2024-06-15 MED ORDER — IPRATROPIUM-ALBUTEROL 0.5-2.5 (3) MG/3ML IN SOLN
3.0000 mL | Freq: Two times a day (BID) | RESPIRATORY_TRACT | 1 refills | Status: DC | PRN
  Filled 2024-06-15: qty 90, 15d supply, fill #0

## 2024-06-15 NOTE — Plan of Care (Signed)
 " Problem: Education: Goal: Ability to describe self-care measures that may prevent or decrease complications (Diabetes Survival Skills Education) will improve Outcome: Adequate for Discharge Goal: Individualized Educational Video(s) Outcome: Adequate for Discharge   Problem: Coping: Goal: Ability to adjust to condition or change in health will improve Outcome: Adequate for Discharge   Problem: Fluid Volume: Goal: Ability to maintain a balanced intake and output will improve Outcome: Adequate for Discharge   Problem: Health Behavior/Discharge Planning: Goal: Ability to identify and utilize available resources and services will improve Outcome: Adequate for Discharge Goal: Ability to manage health-related needs will improve Outcome: Adequate for Discharge   Problem: Metabolic: Goal: Ability to maintain appropriate glucose levels will improve Outcome: Adequate for Discharge   Problem: Nutritional: Goal: Maintenance of adequate nutrition will improve Outcome: Adequate for Discharge Goal: Progress toward achieving an optimal weight will improve Outcome: Adequate for Discharge   Problem: Skin Integrity: Goal: Risk for impaired skin integrity will decrease Outcome: Adequate for Discharge   Problem: Tissue Perfusion: Goal: Adequacy of tissue perfusion will improve Outcome: Adequate for Discharge   Problem: Education: Goal: Knowledge of General Education information will improve Description: Including pain rating scale, medication(s)/side effects and non-pharmacologic comfort measures Outcome: Adequate for Discharge   Problem: Health Behavior/Discharge Planning: Goal: Ability to manage health-related needs will improve Outcome: Adequate for Discharge   Problem: Clinical Measurements: Goal: Ability to maintain clinical measurements within normal limits will improve Outcome: Adequate for Discharge Goal: Will remain free from infection Outcome: Adequate for Discharge Goal:  Diagnostic test results will improve Outcome: Adequate for Discharge Goal: Respiratory complications will improve Outcome: Adequate for Discharge Goal: Cardiovascular complication will be avoided Outcome: Adequate for Discharge   Problem: Activity: Goal: Risk for activity intolerance will decrease Outcome: Adequate for Discharge   Problem: Nutrition: Goal: Adequate nutrition will be maintained Outcome: Adequate for Discharge   Problem: Coping: Goal: Level of anxiety will decrease Outcome: Adequate for Discharge   Problem: Elimination: Goal: Will not experience complications related to bowel motility Outcome: Adequate for Discharge Goal: Will not experience complications related to urinary retention Outcome: Adequate for Discharge   Problem: Pain Managment: Goal: General experience of comfort will improve and/or be controlled Outcome: Adequate for Discharge   Problem: Safety: Goal: Ability to remain free from injury will improve Outcome: Adequate for Discharge   Problem: Skin Integrity: Goal: Risk for impaired skin integrity will decrease Outcome: Adequate for Discharge   Problem: Education: Goal: Ability to demonstrate management of disease process will improve Outcome: Adequate for Discharge Goal: Ability to verbalize understanding of medication therapies will improve Outcome: Adequate for Discharge Goal: Individualized Educational Video(s) Outcome: Adequate for Discharge   Problem: Activity: Goal: Capacity to carry out activities will improve Outcome: Adequate for Discharge   Problem: Cardiac: Goal: Ability to achieve and maintain adequate cardiopulmonary perfusion will improve Outcome: Adequate for Discharge   Problem: Education: Goal: Knowledge of disease or condition will improve Outcome: Adequate for Discharge Goal: Knowledge of the prescribed therapeutic regimen will improve Outcome: Adequate for Discharge Goal: Individualized Educational  Video(s) Outcome: Adequate for Discharge   Problem: Activity: Goal: Ability to tolerate increased activity will improve Outcome: Adequate for Discharge Goal: Will verbalize the importance of balancing activity with adequate rest periods Outcome: Adequate for Discharge   Problem: Respiratory: Goal: Ability to maintain a clear airway will improve Outcome: Adequate for Discharge Goal: Levels of oxygenation will improve Outcome: Adequate for Discharge Goal: Ability to maintain adequate ventilation will improve Outcome: Adequate for Discharge  Problem: Activity: Goal: Ability to tolerate increased activity will improve Outcome: Adequate for Discharge   Problem: Clinical Measurements: Goal: Ability to maintain a body temperature in the normal range will improve Outcome: Adequate for Discharge   Problem: Respiratory: Goal: Ability to maintain adequate ventilation will improve Outcome: Adequate for Discharge Goal: Ability to maintain a clear airway will improve Outcome: Adequate for Discharge   Problem: Education: Goal: Knowledge of the prescribed therapeutic regimen will improve Outcome: Adequate for Discharge   Problem: Activity: Goal: Ability to implement measures to reduce episodes of fatigue will improve Outcome: Adequate for Discharge   Problem: Bowel/Gastric: Goal: Will not experience complications related to bowel motility Outcome: Adequate for Discharge   "

## 2024-06-15 NOTE — Progress Notes (Signed)
" °   06/15/24 0945  Spiritual Encounters  Type of Visit Follow up  Care provided to: Pt and family (Daughter at bedside)  Referral source Nurse (RN/NT/LPN)  Reason for visit Advance directives  OnCall Visit Yes  Spiritual Framework  Presenting Themes Goals in life/care  Advance Directives (For Healthcare)  Does Patient Have a Medical Advance Directive? No  Would patient like information on creating a medical advance directive? Yes (Inpatient - patient defers creating a medical advance directive at this time - Information given) (Pt would like to wait to notarize her AD until her son has visited; she is worried he might be upset about her HCPOA choice. The Pt stated that her son has a drinking problem. Pt will have nurse to contact Chaplain when she's ready to sign the AD)    "

## 2024-06-15 NOTE — Plan of Care (Signed)
 " Problem: Education: Goal: Ability to describe self-care measures that may prevent or decrease complications (Diabetes Survival Skills Education) will improve Outcome: Progressing Goal: Individualized Educational Video(s) Outcome: Progressing   Problem: Coping: Goal: Ability to adjust to condition or change in health will improve Outcome: Progressing   Problem: Fluid Volume: Goal: Ability to maintain a balanced intake and output will improve Outcome: Progressing   Problem: Health Behavior/Discharge Planning: Goal: Ability to identify and utilize available resources and services will improve Outcome: Progressing Goal: Ability to manage health-related needs will improve Outcome: Progressing   Problem: Metabolic: Goal: Ability to maintain appropriate glucose levels will improve Outcome: Progressing   Problem: Nutritional: Goal: Maintenance of adequate nutrition will improve Outcome: Progressing Goal: Progress toward achieving an optimal weight will improve Outcome: Progressing   Problem: Skin Integrity: Goal: Risk for impaired skin integrity will decrease Outcome: Progressing   Problem: Tissue Perfusion: Goal: Adequacy of tissue perfusion will improve Outcome: Progressing   Problem: Education: Goal: Knowledge of General Education information will improve Description: Including pain rating scale, medication(s)/side effects and non-pharmacologic comfort measures Outcome: Progressing   Problem: Health Behavior/Discharge Planning: Goal: Ability to manage health-related needs will improve Outcome: Progressing   Problem: Clinical Measurements: Goal: Ability to maintain clinical measurements within normal limits will improve Outcome: Progressing Goal: Will remain free from infection Outcome: Progressing Goal: Diagnostic test results will improve Outcome: Progressing Goal: Respiratory complications will improve Outcome: Progressing Goal: Cardiovascular complication will  be avoided Outcome: Progressing   Problem: Activity: Goal: Risk for activity intolerance will decrease Outcome: Progressing   Problem: Nutrition: Goal: Adequate nutrition will be maintained Outcome: Progressing   Problem: Coping: Goal: Level of anxiety will decrease Outcome: Progressing   Problem: Elimination: Goal: Will not experience complications related to bowel motility Outcome: Progressing Goal: Will not experience complications related to urinary retention Outcome: Progressing   Problem: Pain Managment: Goal: General experience of comfort will improve and/or be controlled Outcome: Progressing   Problem: Safety: Goal: Ability to remain free from injury will improve Outcome: Progressing   Problem: Skin Integrity: Goal: Risk for impaired skin integrity will decrease Outcome: Progressing   Problem: Education: Goal: Ability to demonstrate management of disease process will improve Outcome: Progressing Goal: Ability to verbalize understanding of medication therapies will improve Outcome: Progressing Goal: Individualized Educational Video(s) Outcome: Progressing   Problem: Activity: Goal: Capacity to carry out activities will improve Outcome: Progressing   Problem: Cardiac: Goal: Ability to achieve and maintain adequate cardiopulmonary perfusion will improve Outcome: Progressing   Problem: Education: Goal: Knowledge of disease or condition will improve Outcome: Progressing Goal: Knowledge of the prescribed therapeutic regimen will improve Outcome: Progressing Goal: Individualized Educational Video(s) Outcome: Progressing   Problem: Activity: Goal: Ability to tolerate increased activity will improve Outcome: Progressing Goal: Will verbalize the importance of balancing activity with adequate rest periods Outcome: Progressing   Problem: Respiratory: Goal: Ability to maintain a clear airway will improve Outcome: Progressing Goal: Levels of oxygenation  will improve Outcome: Progressing Goal: Ability to maintain adequate ventilation will improve Outcome: Progressing   Problem: Activity: Goal: Ability to tolerate increased activity will improve Outcome: Progressing   Problem: Clinical Measurements: Goal: Ability to maintain a body temperature in the normal range will improve Outcome: Progressing   Problem: Respiratory: Goal: Ability to maintain adequate ventilation will improve Outcome: Progressing Goal: Ability to maintain a clear airway will improve Outcome: Progressing   Problem: Education: Goal: Knowledge of the prescribed therapeutic regimen will improve Outcome: Progressing   Problem: Activity:  Goal: Ability to implement measures to reduce episodes of fatigue will improve Outcome: Progressing   Problem: Bowel/Gastric: Goal: Will not experience complications related to bowel motility Outcome: Progressing   "

## 2024-06-15 NOTE — Progress Notes (Signed)
" °   06/15/24 1345  Spiritual Encounters  Type of Visit Follow up  Care provided to: Patient;Significant other  Referral source Nurse (RN/NT/LPN)  Reason for visit Advance directives  OnCall Visit Yes  Spiritual Framework  Presenting Themes Goals in life/care  Interventions  Spiritual Care Interventions Made Established relationship of care and support;Compassionate presence;Encouragement  Intervention Outcomes  Outcomes Connection to spiritual care;Awareness around self/spiritual resourses;Awareness of health;Awareness of support  Advance Directives (For Healthcare)  Does Patient Have a Medical Advance Directive? No (Notaries and volunteers were not available. I checked the documents to make sure they were filled out correctly and the Pt knows how they need to be signed and what to do with them afterward. The Pt should be able to complete the paperwork from home.)    "

## 2024-06-15 NOTE — Discharge Summary (Signed)
 " Physician Discharge Summary   Patient: Joy Ball MRN: [id number] DOB: 1965-07-20  Admit date:     06/09/2024  Discharge date: 06/15/2024  Discharge Physician: Leita Blanch   PCP: Edman Marsa PARAS, DO   Recommendations at discharge:   follow-up PCP in 1 to 2 week Use your oxygen , inhaler, nebulizer as before follow-up with Dr. Geronimo on your next appointment [facility]  Discharge Diagnoses: Principal Problem:   Acute on chronic respiratory failure with hypoxia [facility]) Active Problems:   Pulmonary fibrosis (HCC)   CAP (community acquired pneumonia)   COPD with acute exacerbation (HCC)   Possible acute CHF (congestive heart failure) (HCC)  58 year old F with PMH of PE, ILD, chronic hypoxic RF on 2 L, RUL cancer s/p radiation, coronary artery calcification, borderline PAH and prior smoker with over 40-pack-year history before she quit in 02/2023 presenting with progressive dyspnea despite recent antibiotics and steroid course, and admitted with working diagnosis of LUL pneumonia, COPD/ILD exacerbation and possible pulmonary edema. Patient was started on 6 L due to saturations dropping into the 60s when EMS arrived.    20 pathogen RVP and MRSA PCR screen nonreactive.       Acute on chronic respiratory failure with hypoxia: Multifactorial including ILD/COPD exacerbation, pneumonia, acute heart failure, underlying lung cancer.Significant lung damage Community-acquired pneumonia COPD exacerbation Interstitial lung disease with exacerbation History of lung cancer s/p radiation -- currently on 4 L nasal cannula oxygen  sats 9299%. Desatswith exertion. --CTA chest negative for PE but raises concern for interstitial edema and LUL infiltrate.   --Patient appears euvolemic with negative proBNP and cardiac enzymes arguing against pulmonary edema. --  Recent TTE in 02/2024 without significant finding.  She also had a nonischemic stress test at the same time.   --COVID-19,  RVP and MRSA PCR screen negative.   --CRP 15 with ESR of 37 favoring acute inflammatory response -completed ceftriaxone  and Zithromax  for pneumonia -Continue systemic steroid, scheduled and as needed nebulizers -Continue mucolytics and antitussive. -Wean oxygen  as able.  Incentive spirometry.  Goal saturation 88% with minimum oxygen . -Sputum culture ordered but not able to provide sample. -Appreciate PCCM and palliative recs.  Now DNR. -- Patient follows with Dr. Geronimo in [facility] pulmonary -- patient appears best at baseline at present. Discussed discharge plan today and she is agreeable. Will switch to PO prednisone  taper  for a few more days. She will follow-up with pulmonary as outpatient   Steroid-induced hyperglycemia: A1c 5.1%. -Continue SSI while on steroid   Hypokalemia -- resolved   Mood disorder: Stable -Continue home meds   Goal of care: Prognosis felt to be poor.  Seen by palliative as recommended by pulmonology.   CODE STATUS changed to DNR.      DVT prophylaxis:  enoxaparin  (LOVENOX ) injection 40 mg Start: 06/10/24 0800   Code Status: DNR Family Communication: None at bedside.       Disposition: Home health DISCHARGE MEDICATION: Allergies as of 06/15/2024       Reactions   Amoxicillin Hives        Medication List     STOP taking these medications    LOPERAMIDE  A-D PO   Ofev  100 MG Caps Generic drug: Nintedanib   ondansetron  4 MG disintegrating tablet Commonly known as: ZOFRAN -ODT   ondansetron  4 MG tablet Commonly known as: Zofran        TAKE these medications    acetaminophen  500 MG tablet Commonly known as: TYLENOL  Take 1,500 mg by mouth 3 (  three) times daily. 2 to 3 times a day.   aspirin  EC 81 MG tablet Take 1 tablet (81 mg total) by mouth daily. Swallow whole.   budesonide -formoterol  160-4.5 MCG/ACT inhaler Commonly known as: Symbicort  Inhale 2 puffs into the lungs daily.   buPROPion  150 MG 12 hr  tablet Commonly known as: WELLBUTRIN  SR Take 1 tablet (150 mg total) by mouth 2 (two) times daily.   Dextromethorphan -guaiFENesin  20-200 MG/20ML Liqd Take 20 mLs by mouth every 4 (four) hours as needed for cough.   fluticasone  50 MCG/ACT nasal spray Commonly known as: FLONASE  Place 2 sprays into both nostrils daily. What changed:  when to take this reasons to take this   gabapentin  300 MG capsule Commonly known as: NEURONTIN  TAKE 1 CAPSULE(300 MG) BY MOUTH AT BEDTIME   ipratropium 0.06 % nasal spray Commonly known as: ATROVENT  Place 2 sprays into both nostrils 4 (four) times daily as needed (allergies).   ipratropium-albuterol  0.5-2.5 (3) MG/3ML Soln Commonly known as: DUONEB Take 3 mLs by nebulization 2 (two) times daily as needed (breathing).   loratadine  10 MG tablet Commonly known as: CLARITIN  Take 10 mg by mouth daily.   naproxen  500 MG tablet Commonly known as: NAPROSYN  TAKE 1 TABLET(500 MG) BY MOUTH TWICE DAILY WITH A MEAL   nystatin  100000 UNIT/ML suspension Commonly known as: MYCOSTATIN  SHAKE LIQUID AND TAKE 5 ML BY MOUTH FOUR TIMES DAILY What changed: See the new instructions.   omeprazole  40 MG capsule Commonly known as: PRILOSEC Take 1 capsule (40 mg total) by mouth in the morning and at bedtime.   predniSONE  10 MG tablet Commonly known as: DELTASONE  Take 5 tablets (50 mg total) by mouth daily with breakfast. Taper by 10 mg daily then stop Start taking on: June 16, 2024   Spiriva  Respimat 2.5 MCG/ACT Aers Generic drug: Tiotropium Bromide  Inhale 2 puffs into the lungs daily.   Ventolin  HFA 108 (90 Base) MCG/ACT inhaler Generic drug: albuterol  INHALE 2 PUFFS INTO THE LUNGS EVERY 6 HOURS AS NEEDED FOR WHEEZING OR SHORTNESS OF BREATH               Durable Medical Equipment  (From admission, onward)           Start     Ordered   06/12/24 1600  For home use only DME 4 wheeled rolling walker with seat  Once       Question:  Patient  needs a walker to treat with the following condition  Answer:  Lung cancer (HCC)   06/12/24 1601            Follow-up Information     Edman Marsa PARAS, DO. Schedule an appointment as soon as possible for a visit in 1 week(s).   Specialty: Family Medicine Contact information: [address] [place] [address] [phone]                Discharge Exam: Filed Weights   06/13/24 0500 06/14/24 0600 06/15/24 0500  Weight: 49.9 kg 50 kg 48.7 kg   GENERAL:  58 y.o.-year-old patient with no acute distress. Frail LUNGS: decreased breath sounds bilaterally, no wheezing CARDIOVASCULAR: S1, S2 normal. No murmur   ABDOMEN: Soft, nontender, nondistended.  NEUROLOGIC: nonfocal  patient is alert and awake   Condition at discharge: fair  The results of significant diagnostics from this hospitalization (including imaging, microbiology, ancillary and laboratory) are listed below for reference.   Imaging Studies: DG Chest 1 View Result Date: 06/11/2024 EXAM: 1 VIEW(S)  XRAY OF THE CHEST 06/11/2024 08:34:42 PM COMPARISON: 06/09/2024 CLINICAL HISTORY: SOB (shortness of breath) FINDINGS: LUNGS AND PLEURA: Stable lower lung predominant interstitial opacities. Biapical pleural thickening and scarring. No pleural effusion. No pneumothorax. HEART AND MEDIASTINUM: No acute abnormality of the cardiac and mediastinal silhouettes. BONES AND SOFT TISSUES: No acute osseous abnormality. IMPRESSION: 1. Stable lower lung predominant interstitial opacities and biapical pleural thickening and scarring. Electronically signed by: Oneil Devonshire MD 06/11/2024 08:40 PM EST RP Workstation: [id number]   CT Angio Chest PE W/Cm &/Or Wo Cm Result Date: 06/10/2024 EXAM: CTA of the Chest with contrast for PE 06/10/2024 02:06:34 AM TECHNIQUE: CTA of the chest was performed without and with the administration of 75 mL of intravenous iohexol  (OMNIPAQUE ) 350 MG/ML injection. Multiplanar reformatted images are provided  for review. MIP images are provided for review. Automated exposure control, iterative reconstruction, and/or weight based adjustment of the mA/kV was utilized to reduce the radiation dose to as low as reasonably achievable. COMPARISON: 02/12/2024 plain film from the previous day. SABRA CLINICAL HISTORY: Shortness of breath. FINDINGS: PULMONARY ARTERIES: Pulmonary arteries are adequately opacified for evaluation. The pulmonary artery shows a normal branching pattern. No filling defect to suggest pulmonary embolism is noted. Main pulmonary artery is normal in caliber. MEDIASTINUM: The heart is at the upper limits of normal in size. Mild coronary calcifications are seen. Thoracic aorta shows atherosclerotic calcifications without an aneurysmal dilatation. The esophagus is within normal limits. LYMPH NODES: Scattered mediastinal lymph nodes are identified and stable from the prior exam. These are likely reactive in nature. The largest of these lies in the subcarinal region measuring up to 15 mm. No hilar or axillary lymphadenopathy. LUNGS AND PLEURA: The lungs are well aerated bilaterally. Bronchiectatic changes are seen. Diffuse emphysematous changes are noted. Right apical scarring is again seen, consistent with the patient's given clinical history and likely post-irradiation therapy. Infiltrative changes are seen in the lateral aspect of the left upper lobe new when compared with the prior study, likely representing acute infiltrate. Diffuse interstitial edema is noted particularly in the lower lobes bilaterally. No pleural effusion or pneumothorax. UPPER ABDOMEN: The visualized upper abdomen is within normal limits. SOFT TISSUES AND BONES: No acute bone or soft tissue abnormality. No bony abnormality is noted. IMPRESSION: 1. No evidence of pulmonary embolism. 2. Diffuse interstitial edema, particularly in the lower lobes bilaterally. No effusion. 3. Likely acute infiltrate in the lateral aspect of the left upper lobe.  4. Chronic changes in the right apex. Electronically signed by: Oneil Devonshire MD 06/10/2024 02:16 AM EST RP Workstation: [id number]   DG Chest Port 1 View Result Date: 06/09/2024 EXAM: 1 VIEW(S) XRAY OF THE CHEST 06/09/2024 11:30:46 PM COMPARISON: Chest x-ray 05/31/2024 and chest CT 02/12/2024. CLINICAL HISTORY: shob copd FINDINGS: LUNGS AND PLEURA: Stable right upper lobe parenchymal opacity. Stable strandy and patchy opacities in the peripheral left upper lobe. Chronic interstitial opacities in the lung bases compatible with fibrosis as seen on the prior CT. No new focal lung infiltrate. No pleural effusion. No pneumothorax. HEART AND MEDIASTINUM: No acute abnormality of the cardiac and mediastinal silhouettes. BONES AND SOFT TISSUES: No acute osseous abnormality. IMPRESSION: 1. Stable bilateral upper lobes parenchymal opacities. 2. Chronic bibasilar interstitial opacities consistent with fibrosis. 3. No new focal lung infiltrate, pleural effusion, or pneumothorax. Electronically signed by: Greig Pique MD 06/09/2024 11:33 PM EST RP Workstation: [id number]   CT ABDOMEN PELVIS W CONTRAST Result Date: 05/31/2024 CLINICAL DATA:  Right flank and upper quadrant pain EXAM: CT  ABDOMEN AND PELVIS WITH CONTRAST TECHNIQUE: Multidetector CT imaging of the abdomen and pelvis was performed using the standard protocol following bolus administration of intravenous contrast. RADIATION DOSE REDUCTION: This exam was performed according to the departmental dose-optimization program which includes automated exposure control, adjustment of the mA and/or kV according to patient size and/or use of iterative reconstruction technique. CONTRAST:  OMNIPAQUE  IOHEXOL  300 MG/ML  SOLN COMPARISON:  Chest x-ray 05/31/2024, PET CT 07/31/2023, CT 09/25/2014 FINDINGS: Lower chest: Lung bases demonstrate fibrotic lung disease. No acute airspace disease. Hepatobiliary: No focal liver abnormality is seen. No gallstones, gallbladder wall  thickening, or biliary dilatation. Pancreas: Unremarkable. No pancreatic ductal dilatation or surrounding inflammatory changes. Spleen: Normal in size without focal abnormality. Adrenals/Urinary Tract: Adrenal glands are unremarkable. Kidneys are normal, without renal calculi, focal lesion, or hydronephrosis. Bladder is unremarkable. Stomach/Bowel: Stomach is within normal limits. Appendix appears normal. No evidence of bowel wall thickening, distention, or inflammatory changes. Vascular/Lymphatic: Aortic atherosclerosis. No enlarged abdominal or pelvic lymph nodes. Reproductive: Uterus and bilateral adnexa are unremarkable. Other: Negative for ascites or free air Musculoskeletal: No acute or suspicious osseous abnormality IMPRESSION: 1. No CT evidence for acute intra-abdominal or pelvic abnormality. 2. Fibrotic lung disease at the bases. 3. Aortic atherosclerosis. Aortic Atherosclerosis (ICD10-I70.0). Electronically Signed   By: Luke Bun M.D.   On: 05/31/2024 16:52   DG Chest 2 View Result Date: 05/31/2024 CLINICAL DATA:  Right-sided chest and right upper quadrant abdominal pain for 2 weeks. History of pulmonary fibrosis. History of lung cancer with SBRT earlier this year. EXAM: CHEST - 2 VIEW COMPARISON:  Chest radiographs 05/26/2024 and 12/29/2023. Chest CT 02/12/2024. FINDINGS: Compared with the radiographs of 5 days ago, there has been no significant change in the superior hilar retraction, architectural distortion and biapical scarring. These findings are progressive from older prior studies and have been attributed to radiation therapy on CT. Underlying severe interstitial lung disease without new airspace disease, pleural effusion or pneumothorax. The heart size and mediastinal contours are stable. No acute osseous findings are evident. IMPRESSION: 1. No acute cardiopulmonary process or significant change from recent radiographs. 2. Progressive superior hilar retraction and biapical scarring,  attributed to radiation therapy. Grossly stable underlying chronic interstitial lung disease. Electronically Signed   By: Elsie Perone M.D.   On: 05/31/2024 15:12   DG Chest 2 View Result Date: 05/26/2024 EXAM: 2 VIEW(S) XRAY OF THE CHEST 05/26/2024 11:01:21 AM COMPARISON: 12/29/2023 CLINICAL HISTORY: SOB, productive cough, pleurisy FINDINGS: LUNGS AND PLEURA: Bilateral upper lobe opacities concerning for possible pneumonia or scarring. Possible small loculated pleural effusion seen in the right upper lobe. No pneumothorax. HEART AND MEDIASTINUM: No acute abnormality of the cardiac and mediastinal silhouettes. BONES AND SOFT TISSUES: No acute osseous abnormality. IMPRESSION: 1. Bilateral upper lobe opacities suspicious for pneumonia versus scarring. 2. Possible small loculated right upper lobe pleural effusion. Electronically signed by: Lynwood Seip MD 05/26/2024 11:39 AM EST RP Workstation: [id number]    Microbiology: Results for orders placed or performed during the hospital encounter of 06/09/24  Resp panel by RT-PCR (RSV, Flu A&B, Covid) Anterior Nasal Swab     Status: None   Collection Time: 06/09/24 11:13 PM   Specimen: Anterior Nasal Swab  Result Value Ref Range Status   SARS Coronavirus 2 by RT PCR NEGATIVE NEGATIVE Final    Comment: (NOTE) SARS-CoV-2 target nucleic acids are NOT DETECTED.  The SARS-CoV-2 RNA is generally detectable in upper respiratory specimens during the acute phase of infection. The lowest  concentration of SARS-CoV-2 viral copies this assay can detect is 138 copies/mL. A negative result does not preclude SARS-Cov-2 infection and should not be used as the sole basis for treatment or other patient management decisions. A negative result may occur with  improper specimen collection/handling, submission of specimen other than nasopharyngeal swab, presence of viral mutation(s) within the areas targeted by this assay, and inadequate number of viral copies(<138  copies/mL). A negative result must be combined with clinical observations, patient history, and epidemiological information. The expected result is Negative.  Fact Sheet for Patients:  bloggercourse.com  Fact Sheet for Healthcare Providers:  seriousbroker.it  This test is no t yet approved or cleared by the [place]  FDA and  has been authorized for detection and/or diagnosis of SARS-CoV-2 by FDA under an Emergency Use Authorization (EUA). This EUA will remain  in effect (meaning this test can be used) for the duration of the COVID-19 declaration under Section 564(b)(1) of the Act, 21 U.S.C.section 360bbb-3(b)(1), unless the authorization is terminated  or revoked sooner.       Influenza A by PCR NEGATIVE NEGATIVE Final   Influenza B by PCR NEGATIVE NEGATIVE Final    Comment: (NOTE) The Xpert Xpress SARS-CoV-2/FLU/RSV plus assay is intended as an aid in the diagnosis of influenza from Nasopharyngeal swab specimens and should not be used as a sole basis for treatment. Nasal washings and aspirates are unacceptable for Xpert Xpress SARS-CoV-2/FLU/RSV testing.  Fact Sheet for Patients: bloggercourse.com  Fact Sheet for Healthcare Providers: seriousbroker.it  This test is not yet approved or cleared by the [place]  FDA and has been authorized for detection and/or diagnosis of SARS-CoV-2 by FDA under an Emergency Use Authorization (EUA). This EUA will remain in effect (meaning this test can be used) for the duration of the COVID-19 declaration under Section 564(b)(1) of the Act, 21 U.S.C. section 360bbb-3(b)(1), unless the authorization is terminated or revoked.     Resp Syncytial Virus by PCR NEGATIVE NEGATIVE Final    Comment: (NOTE) Fact Sheet for Patients: bloggercourse.com  Fact Sheet for Healthcare  Providers: seriousbroker.it  This test is not yet approved or cleared by the [place]  FDA and has been authorized for detection and/or diagnosis of SARS-CoV-2 by FDA under an Emergency Use Authorization (EUA). This EUA will remain in effect (meaning this test can be used) for the duration of the COVID-19 declaration under Section 564(b)(1) of the Act, 21 U.S.C. section 360bbb-3(b)(1), unless the authorization is terminated or revoked.  Performed at [facility], [address] [place], [place] [address]   MRSA Next Gen by PCR, Nasal     Status: None   Collection Time: 06/10/24  8:13 AM   Specimen: Nasal Mucosa; Nasal Swab  Result Value Ref Range Status   MRSA by PCR Next Gen NOT DETECTED NOT DETECTED Final    Comment: (NOTE) The GeneXpert MRSA Assay (FDA approved for NASAL specimens only), is one component of a comprehensive MRSA colonization surveillance program. It is not intended to diagnose MRSA infection nor to guide or monitor treatment for MRSA infections. Test performance is not FDA approved in patients less than 75 years old. Performed at [facility], [address] [place], [place] [address]   Respiratory (~20 pathogens) panel by PCR     Status: None   Collection Time: 06/10/24  8:13 AM   Specimen: Nasal Mucosa; Respiratory  Result Value Ref Range Status   Adenovirus NOT DETECTED NOT DETECTED Final   Coronavirus 229E NOT  DETECTED NOT DETECTED Final    Comment: (NOTE) The Coronavirus on the Respiratory Panel, DOES NOT test for the novel  Coronavirus (2019 nCoV)    Coronavirus HKU1 NOT DETECTED NOT DETECTED Final   Coronavirus NL63 NOT DETECTED NOT DETECTED Final   Coronavirus OC43 NOT DETECTED NOT DETECTED Final   Metapneumovirus NOT DETECTED NOT DETECTED Final   Rhinovirus / Enterovirus NOT DETECTED NOT DETECTED Final   Influenza A NOT DETECTED NOT DETECTED Final   Influenza B NOT DETECTED NOT DETECTED Final    Parainfluenza Virus 1 NOT DETECTED NOT DETECTED Final   Parainfluenza Virus 2 NOT DETECTED NOT DETECTED Final   Parainfluenza Virus 3 NOT DETECTED NOT DETECTED Final   Parainfluenza Virus 4 NOT DETECTED NOT DETECTED Final   Respiratory Syncytial Virus NOT DETECTED NOT DETECTED Final   Bordetella pertussis NOT DETECTED NOT DETECTED Final   Bordetella Parapertussis NOT DETECTED NOT DETECTED Final   Chlamydophila pneumoniae NOT DETECTED NOT DETECTED Final   Mycoplasma pneumoniae NOT DETECTED NOT DETECTED Final    Comment: Performed at [facility], [address] [place], [place] [address]    Labs: CBC: Recent Labs  Lab 06/09/24 2312 06/10/24 0900 06/11/24 0431 06/12/24 0418 06/13/24 0454  WBC 19.1* 16.6* 19.0* 14.2* 12.7*  NEUTROABS 15.3*  --   --   --   --   HGB 11.8* 12.1 11.8* 12.2 12.4  HCT 36.5 37.2 37.4 38.6 40.9  MCV 98.4 97.6 99.7 100.5* 102.5*  PLT 495* 445* 475* 484* 476*   Basic Metabolic Panel: Recent Labs  Lab 06/09/24 2312 06/10/24 0900 06/11/24 0431 06/12/24 0418 06/13/24 0454  NA 140 139 138 138 136  K 3.4* 3.3* 4.6 4.2 4.4  CL 105 100 101 99 101  CO2 25 21* 24 25 20*  GLUCOSE 99 255* 107* 88 112*  BUN 24* 16 24* 25* 28*  CREATININE 0.56 0.72 0.79 0.71 0.71  CALCIUM 8.8* 8.7* 8.9 9.0 9.1  MG  --  2.4 2.5* 2.4 2.6*  PHOS  --   --  4.0 4.0 4.1   Liver Function Tests: Recent Labs  Lab 06/09/24 2312 06/10/24 0900 06/11/24 0431 06/12/24 0418 06/13/24 0454  AST 23 31  --   --   --   ALT 9 15  --   --   --   ALKPHOS 171* 185*  --   --   --   BILITOT 0.3 0.3  --   --   --   PROT 6.2* 7.3  --   --   --   ALBUMIN 3.5 3.8 3.6 3.6 3.8   CBG: Recent Labs  Lab 06/14/24 1100 06/14/24 1705 06/14/24 2054 06/15/24 0822 06/15/24 1217  GLUCAP 110* 114* 127* 116* 94    Discharge time spent: greater than 30 minutes.  Signed: Leita Blanch, MD [facility] 06/15/2024 "

## 2024-06-15 NOTE — TOC Transition Note (Signed)
 Transition of Care [facility]) - Discharge Note   Patient Details  Name: Joy Ball MRN: [id number] Date of Birth: [birth year]  [facility] CM/SW Contact:  Shasta DELENA Daring, RN Phone Number: 06/15/2024, 2:17 PM   Clinical Narrative:    Patient has discharge orders.  Will discharge home. Rollator was delivered.  NO additional TOC needs.  RNCM signing off.   Final next level of care: Home/Self Care Barriers to Discharge: Barriers Resolved   Patient Goals and CMS Choice            Discharge Placement                    Patient and family notified of of transfer: 06/15/24  Discharge Plan and Services Additional resources added to the After Visit Summary for       Post Acute Care Choice: NA                               Social Drivers of Health (SDOH) Interventions SDOH Screenings   Food Insecurity: Food Insecurity Present (06/11/2024)  Housing: Low Risk (06/11/2024)  Transportation Needs: No Transportation Needs (06/11/2024)  Utilities: Not At Risk (06/11/2024)  Alcohol Screen: Low Risk (11/11/2022)  Depression (PHQ2-9): Low Risk (02/25/2024)  Social Connections: Unknown (12/22/2022)   Received from [facility]  Tobacco Use: Medium Risk (06/09/2024)  Health Literacy: Adequate Health Literacy (08/19/2023)     Readmission Risk Interventions    06/10/2024    4:13 PM  Readmission Risk Prevention Plan  Transportation Screening Complete  PCP or Specialist Appt within 3-5 Days Complete  Social Work Consult for Recovery Care Planning/Counseling Complete  Palliative Care Screening Not Applicable  Medication Review Oceanographer) Complete

## 2024-06-15 NOTE — Progress Notes (Signed)
 Physical Therapy Treatment Patient Details Name: Joy Ball MRN: [id number] DOB: April 03, 1966 Today's Date: 06/15/2024   History of Present Illness 58 year old female with past medical history of PE, ILD, chronic hypoxic respiratory failure on 2 L at baseline.  Right upper lobe lung cancer.  S/P  radiation, coronary artery calcification, borderline PAH and prior smoker with a 40 pack/year history reports she quit in 02/2023.    PT Comments  Great tolerance for PT session while on 4L O2. Pt able to ambulate with Rollator in hall with SpO2 dropping after ~37ft to 85%, returning to mid 90's after 1 minute seated rest break. Much improved from last session. Pt planning to return home today with assist from family.    If plan is discharge home, recommend the following: A little help with walking and/or transfers;Assist for transportation;Help with stairs or ramp for entrance   Can travel by private vehicle        Equipment Recommendations  None recommended by PT    Recommendations for Other Services       Precautions / Restrictions Precautions Precautions: None Restrictions Weight Bearing Restrictions Per Provider Order: No     Mobility  Bed Mobility Overal bed mobility: Needs Assistance Bed Mobility: Supine to Sit, Sit to Supine     Supine to sit: Modified independent (Device/Increase time) Sit to supine: Modified independent (Device/Increase time)        Transfers Overall transfer level: Needs assistance Equipment used: Rollator (4 wheels), 1 person hand held assist Transfers: Sit to/from Stand Sit to Stand: Supervision                Ambulation/Gait Ambulation/Gait assistance: Supervision Gait Distance (Feet): 85 Feet Assistive device: Rollator (4 wheels) Gait Pattern/deviations: Step-through pattern Gait velocity: decr     General Gait Details:  (Pt ambulated on 4L O2, SpO2 dropped after ~ 96ft down to 85%, pt able to recover with seated rest break after  ~1 minute)   Stairs             Wheelchair Mobility     Tilt Bed    Modified Rankin (Stroke Patients Only)       Balance                                            Communication Communication Communication: No apparent difficulties  Cognition Arousal: Alert Behavior During Therapy: WFL for tasks assessed/performed   PT - Cognitive impairments: No apparent impairments                         Following commands: Intact      Cueing Cueing Techniques: Verbal cues  Exercises      General Comments General comments (skin integrity, edema, etc.):  (Pt educated on PLB and energy conservation techniques. Rollator in room for d/c was adjusted to proper height.)      Pertinent Vitals/Pain Pain Assessment Pain Assessment: No/denies pain    Home Living                          Prior Function            PT Goals (current goals can now be found in the care plan section) Acute Rehab PT Goals Patient Stated Goal: unstated Progress towards PT goals: Progressing toward goals  Frequency    Min 1X/week      PT Plan      Co-evaluation              AM-PAC PT 6 Clicks Mobility   Outcome Measure  Help needed turning from your back to your side while in a flat bed without using bedrails?: None Help needed moving from lying on your back to sitting on the side of a flat bed without using bedrails?: None Help needed moving to and from a bed to a chair (including a wheelchair)?: None Help needed standing up from a chair using your arms (e.g., wheelchair or bedside chair)?: None Help needed to walk in hospital room?: A Little Help needed climbing 3-5 steps with a railing? : A Lot 6 Click Score: 21    End of Session Equipment Utilized During Treatment: Oxygen  (4L O2) Activity Tolerance: Treatment limited secondary to medical complications (Comment) Patient left: in bed;with call bell/phone within reach;with bed  alarm set Nurse Communication: Mobility status PT Visit Diagnosis: Other symptoms and signs involving the nervous system (R29.898)     Time: 8749-8678 PT Time Calculation (min) (ACUTE ONLY): 31 min  Charges:    $Gait Training: 8-22 mins $Therapeutic Activity: 8-22 mins PT General Charges $$ ACUTE PT VISIT: 1 Visit                    Darice Bohr, PTA  Darice JAYSON Bohr 06/15/2024, 2:16 PM

## 2024-06-15 NOTE — Discharge Instructions (Signed)
 Use your oxygen , inhaler, nebulizer as before follow-up with Dr. Geronimo on your next appointment [facility]

## 2024-06-17 ENCOUNTER — Telehealth: Payer: Self-pay

## 2024-06-17 NOTE — Transitions of Care (Post Inpatient/ED Visit) (Signed)
" ° °  06/17/2024  Name: Joy Ball MRN: [id number] DOB: Dec 26, 1965  Today's TOC FU Call Status: Today's TOC FU Call Status:: Unsuccessful Call (1st Attempt) Unsuccessful Call (1st Attempt) Date: 06/17/24  Attempted to reach the patient regarding the most recent Inpatient/ED visit.  Follow Up Plan: Additional outreach attempts will be made to reach the patient to complete the Transitions of Care (Post Inpatient/ED visit) call.   [name] J. Kanoelani Dobies RN, MSN [facility], [facility] RN Care Manager Direct Dial: [phone]  Fax: [phone] Website: [email or web address]   "

## 2024-06-20 ENCOUNTER — Telehealth: Payer: Self-pay

## 2024-06-20 NOTE — Transitions of Care (Post Inpatient/ED Visit) (Signed)
" ° °  06/20/2024  Name: Joy Ball MRN: [id number] DOB: 1966/01/14  Today's TOC FU Call Status: Today's TOC FU Call Status:: Unsuccessful Call (2nd Attempt) Unsuccessful Call (1st Attempt) Date: 06/17/24 Unsuccessful Call (2nd Attempt) Date: 06/20/24  Attempted to reach the patient regarding the most recent Inpatient/ED visit.  Follow Up Plan: Additional outreach attempts will be made to reach the patient to complete the Transitions of Care (Post Inpatient/ED visit) call.   Medford Balboa, BSN, RN [facility]  VBCI - Population Health RN Care Manager [phone]  "

## 2024-06-21 ENCOUNTER — Ambulatory Visit: Attending: Internal Medicine

## 2024-06-21 ENCOUNTER — Other Ambulatory Visit: Payer: Self-pay | Admitting: Family Medicine

## 2024-06-21 DIAGNOSIS — Z720 Tobacco use: Secondary | ICD-10-CM

## 2024-06-21 DIAGNOSIS — J4521 Mild intermittent asthma with (acute) exacerbation: Secondary | ICD-10-CM

## 2024-06-21 DIAGNOSIS — J8410 Pulmonary fibrosis, unspecified: Secondary | ICD-10-CM

## 2024-06-21 MED ORDER — JASCAYD 18 MG PO TABS
18.0000 mg | ORAL_TABLET | Freq: Two times a day (BID) | ORAL | 5 refills | Status: DC
  Filled 2024-06-22: qty 60, 30d supply, fill #0

## 2024-06-21 NOTE — Addendum Note (Signed)
 Addended by: Kushal Saunders L on: 06/21/2024 04:00 PM   Modules accepted: Orders

## 2024-06-21 NOTE — Progress Notes (Signed)
 [facility]  Referring Provider: Comer Rouleau, NP  Virtual Visit via Telephone Note  I connected with Joy Ball on 06/21/2024 at  2:30 PM EST by telephone and verified that I am speaking with the correct person using two identifiers.  Location: Patient: home Provider: office   I discussed the limitations, risks, security and privacy concerns of performing an evaluation and management service by telephone and the availability of in person appointments. I also discussed with the patient that there may be a patient responsible charge related to this service. The patient expressed understanding and agreed to proceed.  Subjective:  Patient called today by [facility] team for Joy Ball  new start education.  She is referred by Izetta Rouleau, NP. Pertinent past medical history includes pulmonary fibrosis.   Objective: Allergies[1]  Outpatient Encounter Medications as of 06/21/2024  Medication Sig   acetaminophen  (TYLENOL ) 500 MG tablet Take 1,500 mg by mouth 3 (three) times daily. 2 to 3 times a day.   aspirin  EC 81 MG tablet Take 1 tablet (81 mg total) by mouth daily. Swallow whole.   budesonide -formoterol  (SYMBICORT ) 160-4.5 MCG/ACT inhaler Inhale 2 puffs into the lungs daily.   buPROPion  (WELLBUTRIN  SR) 150 MG 12 hr tablet Take 1 tablet (150 mg total) by mouth 2 (two) times daily.   fluticasone  (FLONASE ) 50 MCG/ACT nasal spray Place 2 sprays into both nostrils daily. (Patient taking differently: Place 2 sprays into both nostrils daily as needed for allergies.)   gabapentin  (NEURONTIN ) 300 MG capsule TAKE 1 CAPSULE(300 MG) BY MOUTH AT BEDTIME   Dextromethorphan -guaiFENesin  20-200 MG/20ML LIQD Take 20 mLs by mouth every 4 (four) hours as needed for cough.   ipratropium (ATROVENT ) 0.06 % nasal spray Place 2 sprays into both nostrils 4 (four) times daily as needed (allergies).   ipratropium-albuterol  (DUONEB) 0.5-2.5 (3) MG/3ML SOLN Take 3 mLs by  nebulization 2 (two) times daily as needed (breathing).   loratadine  (CLARITIN ) 10 MG tablet Take 10 mg by mouth daily.   naproxen  (NAPROSYN ) 500 MG tablet TAKE 1 TABLET(500 MG) BY MOUTH TWICE DAILY WITH A MEAL   nystatin  (MYCOSTATIN ) 100000 UNIT/ML suspension SHAKE LIQUID AND TAKE 5 ML BY MOUTH FOUR TIMES DAILY (Patient taking differently: Take 5 mLs by mouth 4 (four) times daily as needed (thrush).)   omeprazole  (PRILOSEC) 40 MG capsule Take 1 capsule (40 mg total) by mouth in the morning and at bedtime.   predniSONE  (DELTASONE ) 10 MG tablet Take 5 tablets (50 mg total) by mouth daily with breakfast for 1 day, THEN 4 tablets (40 mg total) daily with breakfast for 1 day, THEN 3 tablets (30 mg total) daily with breakfast for 1 day, THEN 2 tablets (20 mg total) daily with breakfast for 1 day, THEN 1 tablet (10 mg total) daily with breakfast for 1 day. Taper by 10 mg daily then stop.   Tiotropium Bromide  Monohydrate (SPIRIVA  RESPIMAT) 2.5 MCG/ACT AERS Inhale 2 puffs into the lungs daily.   VENTOLIN  HFA 108 (90 Base) MCG/ACT inhaler INHALE 2 PUFFS INTO THE LUNGS EVERY 6 HOURS AS NEEDED FOR WHEEZING OR SHORTNESS OF BREATH   No facility-administered encounter medications on file as of 06/21/2024.     Immunization History  Administered Date(s) Administered   Influenza, Seasonal, Injecte, Preservative Fre 04/07/2024   Influenza,inj,Quad PF,6+ Mos 07/27/2015   Influenza-Unspecified 02/24/2014   PNEUMOCOCCAL CONJUGATE-20 04/07/2024      PFT's TLC  Date Value Ref Range Status  04/07/2024 2.17 L Final  CMP     Component Value Date/Time   NA 136 06/13/2024 0454   NA 139 04/19/2024 0925   K 4.4 06/13/2024 0454   CL 101 06/13/2024 0454   CO2 20 (L) 06/13/2024 0454   GLUCOSE 112 (H) 06/13/2024 0454   BUN 28 (H) 06/13/2024 0454   BUN 10 04/19/2024 0925   CREATININE 0.71 06/13/2024 0454   CREATININE 0.83 05/11/2024 1328   CREATININE 0.75 11/28/2022 0915   CALCIUM 9.1 06/13/2024 0454    PROT 7.3 06/10/2024 0900   ALBUMIN 3.8 06/13/2024 0454   AST 31 06/10/2024 0900   AST 45 (H) 08/19/2023 1611   ALT 15 06/10/2024 0900   ALT 32 08/19/2023 1611   ALKPHOS 185 (H) 06/10/2024 0900   BILITOT 0.3 06/10/2024 0900   BILITOT 0.8 08/19/2023 1611   GFRNONAA >60 06/13/2024 0454   GFRNONAA >60 05/11/2024 1328   GFRNONAA >89 10/29/2016 1225   GFRAA >89 10/29/2016 1225      CBC    Component Value Date/Time   WBC 12.7 (H) 06/13/2024 0454   RBC 3.99 06/13/2024 0454   HGB 12.4 06/13/2024 0454   HGB 13.0 05/11/2024 1328   HGB 11.7 04/19/2024 0925   HCT 40.9 06/13/2024 0454   HCT 33.9 (L) 04/19/2024 0925   PLT 476 (H) 06/13/2024 0454   PLT 593 (H) 05/11/2024 1328   PLT 426 04/19/2024 0925   MCV 102.5 (H) 06/13/2024 0454   MCV 102 (H) 04/19/2024 0925   MCH 31.1 06/13/2024 0454   MCHC 30.3 06/13/2024 0454   RDW 14.2 06/13/2024 0454   RDW 12.5 04/19/2024 0925   LYMPHSABS 2.5 06/09/2024 2312   MONOABS 1.1 (H) 06/09/2024 2312   EOSABS 0.1 06/09/2024 2312   BASOSABS 0.0 06/09/2024 2312      LFT's    Latest Ref Rng & Units 06/13/2024    4:54 AM 06/12/2024    4:18 AM 06/11/2024    4:31 AM  Hepatic Function  Albumin 3.5 - 5.0 g/dL 3.8  3.6  3.6      Assessment and Plan  Joy Ball  Medication Management Thoroughly counseled patient on the efficacy, mechanism of action, dosing, administration, adverse effects, and monitoring parameters of Joy Ball .  Patient verbalized understanding.   Goals of Therapy: Will not stop or reverse the progression of ILD. It will slow the progression of ILD.   Dosing: Recommended dose will be 18mg  1 tablet twice daily. May be administered with or without regard to food.   Adverse Effects: Weight loss (nerandomilast  monotherapy: 8%; background nintedanib: 14-16%; background pirfenidone : 6%) Decreased appetite (nerandomilast  monotherapy: 6% to 9%; concomitant nintedanib: 7% to 10%; concomitant pirfenidone : 13%) Diarrhea (nerandomilast   monotherapy: 17% to 26%; concomitant nintedanib: 50% to 62%; concomitant pirfenidone : 24%)  Monitoring: Monitor for diarrhea, decreased appetite, weight loss  Drug interactions: nerandomilast  is a major substrate of CYP3A4. Avoid use of moderate and strong CYP3A4 inducers or inhibitors.   Access: Approval of Joy Ball  through: insurance Rx sent to: [facility]: [phone]   PLAN:  - START Joy Ball  18mg  1 tablet twice daily. - Follow-up with Dr. Geronimo as planned on 06/30/24  This appointment required 10 minutes of patient care (this includes precharting, chart review, review of results, virtual care, etc.).  I discussed the assessment and treatment plan with the patient. The patient was provided an opportunity to ask questions and all were answered. The patient agreed with the plan and demonstrated an understanding of the instructions.   The patient was advised to  call back or seek an in-person evaluation if the symptoms worsen or if the condition fails to improve as anticipated.  I provided 10 minutes of non-face-to-face time during this encounter.  Aleck Puls, PharmD, BCPS, CPP Clinical Pharmacist  [facility]      [1]  Allergies Allergen Reactions   Amoxicillin Hives

## 2024-06-21 NOTE — Telephone Encounter (Signed)
 Patient discharged from hospital on 06/15/24.   [name] education completed 06/21/24.

## 2024-06-22 ENCOUNTER — Other Ambulatory Visit: Payer: Self-pay

## 2024-06-22 ENCOUNTER — Other Ambulatory Visit (HOSPITAL_COMMUNITY): Payer: Self-pay

## 2024-06-22 NOTE — Telephone Encounter (Signed)
 OFFICE VISIT NEEDED FOR ADDITIONAL REFILLS   Requested Prescriptions  Pending Prescriptions Disp Refills   buPROPion  (WELLBUTRIN  SR) 150 MG 12 hr tablet [Pharmacy Med Name: BUPROPION  SR 150MG  TABLETS (12 H)] 180 tablet 0    Sig: TAKE 1 TABLET(150 MG) BY MOUTH TWICE DAILY     Psychiatry: Antidepressants - bupropion  Failed - 06/22/2024  3:00 PM      Failed - Valid encounter within last 6 months    Recent Outpatient Visits           Yesterday Pulmonary fibrosis [facility])   [facility] [place] [place] Fitch L, RPH-CPP   8 months ago Adenocarcinoma of right lung [facility])   [facility], [place]              Passed - Cr in normal range and within 360 days    Creatinine  Date Value Ref Range Status  05/11/2024 0.83 0.44 - 1.00 mg/dL Final   Creat  Date Value Ref Range Status  11/28/2022 0.75 0.50 - 1.03 mg/dL Final   Creatinine, Ser  Date Value Ref Range Status  06/13/2024 0.71 0.44 - 1.00 mg/dL Final         Passed - AST in normal range and within 360 days    AST  Date Value Ref Range Status  06/10/2024 31 15 - 41 U/L Final  08/19/2023 45 (H) 15 - 41 U/L Final         Passed - ALT in normal range and within 360 days    ALT  Date Value Ref Range Status  06/10/2024 15 0 - 44 U/L Final  08/19/2023 32 0 - 44 U/L Final         Passed - Last BP in normal range    BP Readings from Last 1 Encounters:  06/15/24 125/73          VENTOLIN  HFA 108 (90 Base) MCG/ACT inhaler [Pharmacy Med Name: VENTOLIN  HFA INH W/DOS CTR 200PUFFS] 18 g 1    Sig: INHALE 2 PUFFS INTO THE LUNGS EVERY 6 HOURS AS NEEDED FOR WHEEZING OR SHORTNESS OF BREATH     Pulmonology:  Beta Agonists 2 Passed - 06/22/2024  3:00 PM      Passed - Last BP in normal range    BP Readings from Last 1 Encounters:  06/15/24 125/73         Passed - Last Heart Rate in normal range    Pulse Readings from Last 1  Encounters:  06/15/24 99         Passed - Valid encounter within last 12 months    Recent Outpatient Visits           Yesterday Pulmonary fibrosis (HCC)   [facility] [place] [place] Fitch L, RPH-CPP   8 months ago Adenocarcinoma of right lung [facility])   [facility] [place], [place], [place]

## 2024-06-22 NOTE — Progress Notes (Unsigned)
 Specialty Pharmacy Initial Fill Coordination Note  Joy Ball is a 58 y.o. female contacted today regarding initial fill of specialty medication(s) Nerandomilast Myrtie)   Patient requested Delivery   Delivery date: 06/27/24   Verified address: [address] [place], [place] [address]   Medication will be filled on: 06/24/24   Patient is aware of $4 copayment.  **Rx will reject for prescriber not Medicaid enrolled, must change authorized MD to Dr. Jude**

## 2024-06-24 ENCOUNTER — Inpatient Hospital Stay

## 2024-06-24 ENCOUNTER — Other Ambulatory Visit: Payer: Self-pay

## 2024-06-24 ENCOUNTER — Emergency Department

## 2024-06-24 ENCOUNTER — Encounter: Payer: Self-pay | Admitting: Pulmonary Disease

## 2024-06-24 ENCOUNTER — Inpatient Hospital Stay
Admission: EM | Admit: 2024-06-24 | Discharge: 2024-07-24 | DRG: 208 | Disposition: E | Attending: Pulmonary Disease | Admitting: Pulmonary Disease

## 2024-06-24 DIAGNOSIS — J9601 Acute respiratory failure with hypoxia: Secondary | ICD-10-CM | POA: Diagnosis present

## 2024-06-24 DIAGNOSIS — K219 Gastro-esophageal reflux disease without esophagitis: Secondary | ICD-10-CM | POA: Diagnosis present

## 2024-06-24 DIAGNOSIS — R0603 Acute respiratory distress: Secondary | ICD-10-CM

## 2024-06-24 DIAGNOSIS — Z7982 Long term (current) use of aspirin: Secondary | ICD-10-CM | POA: Diagnosis not present

## 2024-06-24 DIAGNOSIS — R6510 Systemic inflammatory response syndrome (SIRS) of non-infectious origin without acute organ dysfunction: Secondary | ICD-10-CM

## 2024-06-24 DIAGNOSIS — R54 Age-related physical debility: Secondary | ICD-10-CM | POA: Diagnosis present

## 2024-06-24 DIAGNOSIS — Z8701 Personal history of pneumonia (recurrent): Secondary | ICD-10-CM | POA: Diagnosis not present

## 2024-06-24 DIAGNOSIS — T886XXA Anaphylactic reaction due to adverse effect of correct drug or medicament properly administered, initial encounter: Secondary | ICD-10-CM | POA: Diagnosis not present

## 2024-06-24 DIAGNOSIS — Z923 Personal history of irradiation: Secondary | ICD-10-CM | POA: Diagnosis not present

## 2024-06-24 DIAGNOSIS — Z8249 Family history of ischemic heart disease and other diseases of the circulatory system: Secondary | ICD-10-CM

## 2024-06-24 DIAGNOSIS — J84112 Idiopathic pulmonary fibrosis: Secondary | ICD-10-CM | POA: Diagnosis present

## 2024-06-24 DIAGNOSIS — Z87891 Personal history of nicotine dependence: Secondary | ICD-10-CM | POA: Diagnosis not present

## 2024-06-24 DIAGNOSIS — D649 Anemia, unspecified: Secondary | ICD-10-CM | POA: Diagnosis present

## 2024-06-24 DIAGNOSIS — R579 Shock, unspecified: Secondary | ICD-10-CM | POA: Diagnosis not present

## 2024-06-24 DIAGNOSIS — Z515 Encounter for palliative care: Secondary | ICD-10-CM

## 2024-06-24 DIAGNOSIS — Z7951 Long term (current) use of inhaled steroids: Secondary | ICD-10-CM | POA: Diagnosis not present

## 2024-06-24 DIAGNOSIS — Z66 Do not resuscitate: Secondary | ICD-10-CM | POA: Diagnosis not present

## 2024-06-24 DIAGNOSIS — J80 Acute respiratory distress syndrome: Secondary | ICD-10-CM | POA: Diagnosis not present

## 2024-06-24 DIAGNOSIS — I272 Pulmonary hypertension, unspecified: Secondary | ICD-10-CM | POA: Diagnosis present

## 2024-06-24 DIAGNOSIS — F419 Anxiety disorder, unspecified: Secondary | ICD-10-CM | POA: Diagnosis present

## 2024-06-24 DIAGNOSIS — Z1152 Encounter for screening for COVID-19: Secondary | ICD-10-CM

## 2024-06-24 DIAGNOSIS — Z821 Family history of blindness and visual loss: Secondary | ICD-10-CM | POA: Diagnosis not present

## 2024-06-24 DIAGNOSIS — Z8261 Family history of arthritis: Secondary | ICD-10-CM

## 2024-06-24 DIAGNOSIS — J439 Emphysema, unspecified: Secondary | ICD-10-CM | POA: Diagnosis present

## 2024-06-24 DIAGNOSIS — R0602 Shortness of breath: Secondary | ICD-10-CM | POA: Diagnosis present

## 2024-06-24 DIAGNOSIS — T4275XA Adverse effect of unspecified antiepileptic and sedative-hypnotic drugs, initial encounter: Secondary | ICD-10-CM | POA: Diagnosis not present

## 2024-06-24 DIAGNOSIS — Z880 Allergy status to penicillin: Secondary | ICD-10-CM

## 2024-06-24 DIAGNOSIS — E538 Deficiency of other specified B group vitamins: Secondary | ICD-10-CM | POA: Diagnosis present

## 2024-06-24 DIAGNOSIS — Z79899 Other long term (current) drug therapy: Secondary | ICD-10-CM

## 2024-06-24 DIAGNOSIS — D7589 Other specified diseases of blood and blood-forming organs: Secondary | ICD-10-CM | POA: Diagnosis present

## 2024-06-24 DIAGNOSIS — Z825 Family history of asthma and other chronic lower respiratory diseases: Secondary | ICD-10-CM

## 2024-06-24 DIAGNOSIS — Z85118 Personal history of other malignant neoplasm of bronchus and lung: Secondary | ICD-10-CM

## 2024-06-24 DIAGNOSIS — J441 Chronic obstructive pulmonary disease with (acute) exacerbation: Principal | ICD-10-CM

## 2024-06-24 DIAGNOSIS — J849 Interstitial pulmonary disease, unspecified: Secondary | ICD-10-CM | POA: Diagnosis not present

## 2024-06-24 DIAGNOSIS — Z801 Family history of malignant neoplasm of trachea, bronchus and lung: Secondary | ICD-10-CM

## 2024-06-24 LAB — URINALYSIS, W/ REFLEX TO CULTURE (INFECTION SUSPECTED)
Bacteria, UA: NONE SEEN
Bilirubin Urine: NEGATIVE
Glucose, UA: NEGATIVE mg/dL
Hgb urine dipstick: NEGATIVE
Ketones, ur: NEGATIVE mg/dL
Leukocytes,Ua: NEGATIVE
Nitrite: NEGATIVE
Protein, ur: NEGATIVE mg/dL
Specific Gravity, Urine: 1.018 (ref 1.005–1.030)
pH: 6 (ref 5.0–8.0)

## 2024-06-24 LAB — CBC WITH DIFFERENTIAL/PLATELET
Abs Immature Granulocytes: 0.1 K/uL — ABNORMAL HIGH (ref 0.00–0.07)
Basophils Absolute: 0 K/uL (ref 0.0–0.1)
Basophils Relative: 0 %
Eosinophils Absolute: 0.5 K/uL (ref 0.0–0.5)
Eosinophils Relative: 3 %
HCT: 39 % (ref 36.0–46.0)
Hemoglobin: 12.2 g/dL (ref 12.0–15.0)
Immature Granulocytes: 1 %
Lymphocytes Relative: 7 %
Lymphs Abs: 1.2 K/uL (ref 0.7–4.0)
MCH: 31.1 pg (ref 26.0–34.0)
MCHC: 31.3 g/dL (ref 30.0–36.0)
MCV: 99.5 fL (ref 80.0–100.0)
Monocytes Absolute: 0.6 K/uL (ref 0.1–1.0)
Monocytes Relative: 3 %
Neutro Abs: 15.4 K/uL — ABNORMAL HIGH (ref 1.7–7.7)
Neutrophils Relative %: 86 %
Platelets: 390 K/uL (ref 150–400)
RBC: 3.92 MIL/uL (ref 3.87–5.11)
RDW: 14.1 % (ref 11.5–15.5)
WBC: 17.8 K/uL — ABNORMAL HIGH (ref 4.0–10.5)
nRBC: 0 % (ref 0.0–0.2)

## 2024-06-24 LAB — BLOOD GAS, ARTERIAL
Acid-Base Excess: 0.3 mmol/L (ref 0.0–2.0)
Acid-base deficit: 1 mmol/L (ref 0.0–2.0)
Acid-base deficit: 0.8 mmol/L (ref 0.0–2.0)
Bicarbonate: 23.5 mmol/L (ref 20.0–28.0)
Bicarbonate: 27.2 mmol/L (ref 20.0–28.0)
Bicarbonate: 26.2 mmol/L (ref 20.0–28.0)
FIO2: 100 %
FIO2: 100 %
MECHVT: 350 mL
MECHVT: 350 mL
Mechanical Rate: 24
Mechanical Rate: 28
O2 Saturation: 82.8 %
O2 Saturation: 96.4 %
O2 Saturation: 95 %
PEEP: 5 cmH2O
PEEP: 5 cmH2O
Patient temperature: 37
Patient temperature: 37
Patient temperature: 37
pCO2 arterial: 33 mmHg (ref 32–48)
pCO2 arterial: 62 mmHg — ABNORMAL HIGH (ref 32–48)
pCO2 arterial: 52 mmHg — ABNORMAL HIGH (ref 32–48)
pH, Arterial: 7.46 — ABNORMAL HIGH (ref 7.35–7.45)
pH, Arterial: 7.25 — ABNORMAL LOW (ref 7.35–7.45)
pH, Arterial: 7.31 — ABNORMAL LOW (ref 7.35–7.45)
pO2, Arterial: 51 mmHg — ABNORMAL LOW (ref 83–108)
pO2, Arterial: 151 mmHg — ABNORMAL HIGH (ref 83–108)
pO2, Arterial: 80 mmHg — ABNORMAL LOW (ref 83–108)

## 2024-06-24 LAB — RESP PANEL BY RT-PCR (RSV, FLU A&B, COVID)  RVPGX2
Influenza A by PCR: NEGATIVE
Influenza B by PCR: NEGATIVE
Resp Syncytial Virus by PCR: NEGATIVE
SARS Coronavirus 2 by RT PCR: NEGATIVE

## 2024-06-24 LAB — PROCALCITONIN: Procalcitonin: 0.22 ng/mL

## 2024-06-24 LAB — RESPIRATORY PANEL BY PCR

## 2024-06-24 LAB — COMPREHENSIVE METABOLIC PANEL WITH GFR
ALT: 11 U/L (ref 0–44)
AST: 29 U/L (ref 15–41)
Albumin: 3.6 g/dL (ref 3.5–5.0)
Alkaline Phosphatase: 183 U/L — ABNORMAL HIGH (ref 38–126)
Anion gap: 12 (ref 5–15)
BUN: 18 mg/dL (ref 6–20)
CO2: 23 mmol/L (ref 22–32)
Calcium: 9.7 mg/dL (ref 8.9–10.3)
Chloride: 105 mmol/L (ref 98–111)
Creatinine, Ser: 0.69 mg/dL (ref 0.44–1.00)
GFR, Estimated: >60 mL/min
Glucose, Bld: 88 mg/dL (ref 70–99)
Potassium: 3.7 mmol/L (ref 3.5–5.1)
Sodium: 140 mmol/L (ref 135–145)
Total Bilirubin: 0.4 mg/dL (ref 0.0–1.2)
Total Protein: 6.7 g/dL (ref 6.5–8.1)

## 2024-06-24 LAB — BLOOD GAS, VENOUS
Acid-Base Excess: 0.3 mmol/L (ref 0.0–2.0)
Bicarbonate: 26 mmol/L (ref 20.0–28.0)
Delivery systems: POSITIVE
FIO2: 35 %
O2 Saturation: 75.4 %
Patient temperature: 37
pCO2, Ven: 45 mmHg (ref 44–60)
pH, Ven: 7.37 (ref 7.25–7.43)
pO2, Ven: 46 mmHg — ABNORMAL HIGH (ref 32–45)

## 2024-06-24 LAB — LACTIC ACID, PLASMA
Lactic Acid, Venous: 2.1 mmol/L (ref 0.5–1.9)
Lactic Acid, Venous: 2.2 mmol/L (ref 0.5–1.9)

## 2024-06-24 LAB — GLUCOSE, CAPILLARY
Glucose-Capillary: 105 mg/dL — ABNORMAL HIGH (ref 70–99)
Glucose-Capillary: 121 mg/dL — ABNORMAL HIGH (ref 70–99)
Glucose-Capillary: 136 mg/dL — ABNORMAL HIGH (ref 70–99)
Glucose-Capillary: 139 mg/dL — ABNORMAL HIGH (ref 70–99)
Glucose-Capillary: 154 mg/dL — ABNORMAL HIGH (ref 70–99)

## 2024-06-24 LAB — TROPONIN T, HIGH SENSITIVITY
Troponin T High Sensitivity: <15 ng/L (ref 0–19)
Troponin T High Sensitivity: <15 ng/L (ref 0–19)

## 2024-06-24 LAB — PRO BRAIN NATRIURETIC PEPTIDE: Pro Brain Natriuretic Peptide: 338 pg/mL — ABNORMAL HIGH

## 2024-06-24 LAB — MRSA NEXT GEN BY PCR, NASAL: MRSA by PCR Next Gen: NOT DETECTED

## 2024-06-24 LAB — PROTIME-INR
INR: 1 (ref 0.8–1.2)
Prothrombin Time: 13.6 s (ref 11.4–15.2)

## 2024-06-24 MED ORDER — CHLORHEXIDINE GLUCONATE CLOTH 2 % EX PADS
6.0000 | MEDICATED_PAD | Freq: Every day | CUTANEOUS | Status: DC
  Administered 2024-06-24 – 2024-06-25 (×2): 6 via TOPICAL
  Filled 2024-06-24: qty 6

## 2024-06-24 MED ORDER — ORAL CARE MOUTH RINSE
15.0000 mL | OROMUCOSAL | Status: DC
  Administered 2024-06-24 – 2024-06-25 (×10): 15 mL via OROMUCOSAL

## 2024-06-24 MED ORDER — LORAZEPAM 2 MG/ML IJ SOLN
INTRAMUSCULAR | Status: AC
  Administered 2024-06-24: 1 mg via INTRAVENOUS
  Filled 2024-06-24: qty 1

## 2024-06-24 MED ORDER — IPRATROPIUM-ALBUTEROL 0.5-2.5 (3) MG/3ML IN SOLN
3.0000 mL | RESPIRATORY_TRACT | Status: DC
  Filled 2024-06-24: qty 3

## 2024-06-24 MED ORDER — METHYLPREDNISOLONE SODIUM SUCC 125 MG IJ SOLR
125.0000 mg | Freq: Once | INTRAMUSCULAR | Status: AC
  Administered 2024-06-24: 125 mg via INTRAVENOUS
  Filled 2024-06-24: qty 2

## 2024-06-24 MED ORDER — IPRATROPIUM-ALBUTEROL 0.5-2.5 (3) MG/3ML IN SOLN
3.0000 mL | RESPIRATORY_TRACT | Status: DC
  Administered 2024-06-24 – 2024-06-25 (×4): 3 mL via RESPIRATORY_TRACT
  Filled 2024-06-24 (×3): qty 3

## 2024-06-24 MED ORDER — FENTANYL 2500MCG IN NS 250ML (10MCG/ML) PREMIX INFUSION
0.0000 ug/h | INTRAVENOUS | Status: DC
  Administered 2024-06-24: 25 ug/h via INTRAVENOUS
  Administered 2024-06-25: 200 ug/h via INTRAVENOUS
  Filled 2024-06-24 (×2): qty 250

## 2024-06-24 MED ORDER — SENNA 8.6 MG PO TABS: 1.0000 | ORAL_TABLET | Freq: Two times a day (BID) | ORAL | Status: DC | PRN

## 2024-06-24 MED ORDER — LEVOFLOXACIN IN D5W 750 MG/150ML IV SOLN
750.0000 mg | Freq: Once | INTRAVENOUS | Status: AC
  Administered 2024-06-24: 750 mg via INTRAVENOUS
  Filled 2024-06-24: qty 150

## 2024-06-24 MED ORDER — NOREPINEPHRINE 4 MG/250ML-% IV SOLN
INTRAVENOUS | Status: AC
  Administered 2024-06-24: 2 ug/min via INTRAVENOUS
  Filled 2024-06-24: qty 250

## 2024-06-24 MED ORDER — MORPHINE SULFATE (PF) 2 MG/ML IV SOLN
1.0000 mg | INTRAVENOUS | Status: DC | PRN
  Administered 2024-06-24 (×2): 2 mg via INTRAVENOUS
  Filled 2024-06-24 (×3): qty 1

## 2024-06-24 MED ORDER — VANCOMYCIN HCL IN DEXTROSE 1-5 GM/200ML-% IV SOLN: 1000.0000 mg | INTRAVENOUS | Status: DC

## 2024-06-24 MED ORDER — LACTATED RINGERS IV BOLUS (SEPSIS)
500.0000 mL | Freq: Once | INTRAVENOUS | Status: AC
  Administered 2024-06-24: 500 mL via INTRAVENOUS

## 2024-06-24 MED ORDER — FAMOTIDINE 20 MG PO TABS
20.0000 mg | ORAL_TABLET | Freq: Two times a day (BID) | ORAL | Status: DC
  Administered 2024-06-24 – 2024-06-25 (×2): 20 mg
  Filled 2024-06-24 (×2): qty 1

## 2024-06-24 MED ORDER — IPRATROPIUM-ALBUTEROL 0.5-2.5 (3) MG/3ML IN SOLN
3.0000 mL | RESPIRATORY_TRACT | Status: DC
  Administered 2024-06-24 (×2): 3 mL via RESPIRATORY_TRACT
  Filled 2024-06-24 (×2): qty 3

## 2024-06-24 MED ORDER — NOREPINEPHRINE 4 MG/250ML-% IV SOLN
0.0000 ug/min | INTRAVENOUS | Status: DC
  Administered 2024-06-25: 12 ug/min via INTRAVENOUS
  Filled 2024-06-24: qty 250

## 2024-06-24 MED ORDER — ROCURONIUM BROMIDE 10 MG/ML (PF) SYRINGE
PREFILLED_SYRINGE | INTRAVENOUS | Status: AC
  Administered 2024-06-24: 60 mg via INTRAVENOUS
  Filled 2024-06-24: qty 10

## 2024-06-24 MED ORDER — LORAZEPAM 2 MG/ML IJ SOLN
1.0000 mg | Freq: Once | INTRAMUSCULAR | Status: AC
  Administered 2024-06-24: 1 mg via INTRAVENOUS
  Filled 2024-06-24: qty 1

## 2024-06-24 MED ORDER — MIDAZOLAM HCL (PF) 2 MG/2ML IJ SOLN
1.0000 mg | INTRAMUSCULAR | Status: DC | PRN
  Administered 2024-06-24: 1 mg via INTRAVENOUS
  Administered 2024-06-24: 2 mg via INTRAVENOUS
  Filled 2024-06-24 (×2): qty 2

## 2024-06-24 MED ORDER — ORAL CARE MOUTH RINSE: 15.0000 mL | OROMUCOSAL | Status: DC | PRN

## 2024-06-24 MED ORDER — SODIUM CHLORIDE 0.9 % IV SOLN
2.0000 g | Freq: Two times a day (BID) | INTRAVENOUS | Status: DC
  Administered 2024-06-24 – 2024-06-25 (×3): 2 g via INTRAVENOUS
  Filled 2024-06-24 (×4): qty 12.5

## 2024-06-24 MED ORDER — ENOXAPARIN SODIUM 40 MG/0.4ML IJ SOSY
40.0000 mg | PREFILLED_SYRINGE | INTRAMUSCULAR | Status: DC
  Administered 2024-06-24: 40 mg via SUBCUTANEOUS
  Filled 2024-06-24: qty 0.4

## 2024-06-24 MED ORDER — POLYETHYLENE GLYCOL 3350 17 G PO PACK: 17.0000 g | PACK | Freq: Every day | ORAL | Status: DC | PRN

## 2024-06-24 MED ORDER — INSULIN ASPART 100 UNIT/ML IJ SOLN
0.0000 [IU] | INTRAMUSCULAR | Status: DC
  Administered 2024-06-24: 3 [IU] via SUBCUTANEOUS
  Administered 2024-06-24 (×3): 2 [IU] via SUBCUTANEOUS
  Administered 2024-06-25: 3 [IU] via SUBCUTANEOUS
  Administered 2024-06-25: 2 [IU] via SUBCUTANEOUS
  Filled 2024-06-24: qty 2
  Filled 2024-06-24: qty 3
  Filled 2024-06-24: qty 2
  Filled 2024-06-24: qty 3
  Filled 2024-06-24 (×3): qty 2

## 2024-06-24 MED ORDER — DOCUSATE SODIUM 50 MG/5ML PO LIQD
100.0000 mg | Freq: Two times a day (BID) | ORAL | Status: DC
  Administered 2024-06-24 – 2024-06-25 (×2): 100 mg
  Filled 2024-06-24 (×2): qty 10

## 2024-06-24 MED ORDER — LORAZEPAM 2 MG/ML IJ SOLN: 1.0000 mg | Freq: Once | INTRAMUSCULAR | Status: AC

## 2024-06-24 MED ORDER — POLYETHYLENE GLYCOL 3350 17 G PO PACK
17.0000 g | PACK | Freq: Every day | ORAL | Status: DC
  Administered 2024-06-24 – 2024-06-25 (×2): 17 g
  Filled 2024-06-24 (×2): qty 1

## 2024-06-24 MED ORDER — IOHEXOL 350 MG/ML SOLN
75.0000 mL | Freq: Once | INTRAVENOUS | Status: AC | PRN
  Administered 2024-06-24: 75 mL via INTRAVENOUS

## 2024-06-24 MED ORDER — VANCOMYCIN HCL IN DEXTROSE 1-5 GM/200ML-% IV SOLN
1000.0000 mg | Freq: Once | INTRAVENOUS | Status: DC
  Filled 2024-06-24 (×2): qty 200

## 2024-06-24 MED ORDER — ENOXAPARIN SODIUM 40 MG/0.4ML IJ SOSY: 40.0000 mg | PREFILLED_SYRINGE | INTRAMUSCULAR | Status: DC

## 2024-06-24 MED ORDER — ROCURONIUM BROMIDE 10 MG/ML (PF) SYRINGE: 60.0000 mg | PREFILLED_SYRINGE | Freq: Once | INTRAVENOUS | Status: AC

## 2024-06-24 MED ORDER — PANTOPRAZOLE SODIUM 40 MG IV SOLR
40.0000 mg | INTRAVENOUS | Status: DC
  Administered 2024-06-24: 40 mg via INTRAVENOUS
  Filled 2024-06-24: qty 10

## 2024-06-24 MED ORDER — SODIUM CHLORIDE 0.9 % IV SOLN
500.0000 mg | INTRAVENOUS | Status: DC
  Administered 2024-06-25: 500 mg via INTRAVENOUS
  Filled 2024-06-24: qty 5

## 2024-06-24 MED ORDER — SODIUM CHLORIDE 0.9% FLUSH
10.0000 mL | Freq: Two times a day (BID) | INTRAVENOUS | Status: DC
  Administered 2024-06-24: 10 mL
  Administered 2024-06-25: 20 mL

## 2024-06-24 MED ORDER — FENTANYL BOLUS VIA INFUSION
25.0000 ug | INTRAVENOUS | Status: DC | PRN
  Administered 2024-06-24: 100 ug via INTRAVENOUS

## 2024-06-24 MED ORDER — METHYLPREDNISOLONE SODIUM SUCC 40 MG IJ SOLR
40.0000 mg | Freq: Every day | INTRAMUSCULAR | Status: DC
  Administered 2024-06-24: 40 mg via INTRAVENOUS
  Filled 2024-06-24: qty 1

## 2024-06-24 MED ORDER — MIDAZOLAM HCL (PF) 2 MG/2ML IJ SOLN
2.0000 mg | INTRAMUSCULAR | Status: DC | PRN
  Administered 2024-06-25 (×2): 2 mg via INTRAVENOUS
  Filled 2024-06-24: qty 2
  Filled 2024-06-24 (×2): qty 4

## 2024-06-24 MED ORDER — IPRATROPIUM-ALBUTEROL 0.5-2.5 (3) MG/3ML IN SOLN
3.0000 mL | RESPIRATORY_TRACT | Status: DC | PRN
  Filled 2024-06-24: qty 3

## 2024-06-24 MED ORDER — DEXMEDETOMIDINE HCL IN NACL 400 MCG/100ML IV SOLN
INTRAVENOUS | Status: AC
  Administered 2024-06-24: 0.4 ug/kg/h via INTRAVENOUS
  Filled 2024-06-24: qty 100

## 2024-06-24 MED ORDER — ETOMIDATE 2 MG/ML IV SOLN
INTRAVENOUS | Status: AC
  Administered 2024-06-24: 10 mg via INTRAVENOUS
  Filled 2024-06-24: qty 20

## 2024-06-24 MED ORDER — VANCOMYCIN HCL 1250 MG/250ML IV SOLN: 1250.0000 mg | Freq: Once | INTRAVENOUS | Status: DC

## 2024-06-24 MED ORDER — SODIUM CHLORIDE 0.9 % IV SOLN: 250.0000 mL | INTRAVENOUS | Status: DC

## 2024-06-24 MED ORDER — ETOMIDATE 2 MG/ML IV SOLN: 10.0000 mg | Freq: Once | INTRAVENOUS | Status: AC

## 2024-06-24 MED ORDER — IPRATROPIUM-ALBUTEROL 0.5-2.5 (3) MG/3ML IN SOLN
9.0000 mL | Freq: Once | RESPIRATORY_TRACT | Status: AC
  Administered 2024-06-24: 9 mL via RESPIRATORY_TRACT
  Filled 2024-06-24: qty 9

## 2024-06-24 MED ORDER — DEXMEDETOMIDINE HCL IN NACL 400 MCG/100ML IV SOLN
0.0000 ug/kg/h | INTRAVENOUS | Status: DC
  Administered 2024-06-25 (×2): 1 ug/kg/h via INTRAVENOUS
  Filled 2024-06-24 (×2): qty 100

## 2024-06-24 NOTE — Progress Notes (Signed)

## 2024-06-24 NOTE — Progress Notes (Signed)
 CTA ordered per provider. Emergency planning/management officer, this nurse and respiratory therapy to transport patient to CT Scan. About 44ft down the hall on the way to CT Scan, patient's oxygen  saturation dropped to 85%. Patient in upright position, with increased work of breathing. Transport was aborted by this nurse. Patient returned to ICU room 3. Provider at bedside. Family at bedside.  Patient prepared for intubation. See MAR for medication administration. Patient tolerated intubation. Patient's family updated.

## 2024-06-24 NOTE — Progress Notes (Addendum)
" °   06/24/24 1330  Spiritual Encounters  Type of Visit Follow up  Care provided to: Family (Brother and sister both stated that their brother, (Pt agreed w/her daughter on her last stay)  that Josh drinks and could pose a problem with taking his grief out on the her (the patient).)  Referral source Chaplain assessment  Reason for visit Routine spiritual support  OnCall Visit Yes  Spiritual Framework  Presenting Themes Goals in life/care;Other (comment) (for Family)  Interventions  Spiritual Care Interventions Made Established relationship of care and support;Compassionate presence;Reflective listening;Encouragement  Intervention Outcomes  Outcomes Connection to spiritual care;Connection to values and goals of care;Other (comment) (for Family)   Left note w/secretary to give to Pt's nurse about family dynamics.  "

## 2024-06-24 NOTE — Plan of Care (Signed)
  Problem: Respiratory: Goal: Ability to maintain a clear airway and adequate ventilation will improve Outcome: Progressing   

## 2024-06-24 NOTE — Progress Notes (Signed)
 Pt will not leave the mask alone causing a huge leakand pt not benefiting from the bipap.  Pt was placed on 5L/[place] bubble.  Pt remains to have increased WOB and SOB  Pulse ox is not picking up and is reading 49% which pt looks fine no blueish coloring

## 2024-06-24 NOTE — ED Triage Notes (Signed)
 Pt BIB EMS for shortness of breath. Pt recently discharged with pneumonia, currently wears 4L supplemental O2 at baseline. Pt reports around 0400 she had increased shortness of breath and had to increase her O2 to 10L/min and gave herself an Duoneb treatment. Per EMS, when Fire around on scene patient was at 72% SpO2.  PMHx Lung Cancer and COPD

## 2024-06-24 NOTE — Procedures (Signed)
 Intubation Procedure Note  Joy Ball  [id number]  1966-02-13  Date:06/24/2024  Time:6:19 PM   Provider Performing:Quanell Loughney D Shellia    Procedure: Intubation (31500)  Indication(s) Respiratory Failure  Consent Risks of the procedure as well as the alternatives and risks of each were explained to the patient and/or caregiver.  Consent for the procedure was obtained and is signed in the bedside chart   Anesthesia Etomidate and Rocuronium    Time Out Verified patient identification, verified procedure, site/side was marked, verified correct patient position, special equipment/implants available, medications/allergies/relevant history reviewed, required imaging and test results available.   Sterile Technique Usual hand hygeine, masks, and gloves were used   Procedure Description Patient positioned in bed supine.  Sedation given as noted above.  Patient was intubated with endotracheal tube using Glidescope.  View was Grade 1 full glottis .  Number of attempts was 1.  Colorimetric CO2 detector was consistent with tracheal placement.   Complications/Tolerance None; patient tolerated the procedure well. Chest X-ray is ordered to verify placement.   EBL Minimal   Specimen(s) None    Size 7.5 ETT Tube secured at 23 cm at lip.    Inge Shellia, AGACNP-BC  Pulmonary & Critical Care Prefer epic messenger for cross cover needs If after hours, please call E-link

## 2024-06-24 NOTE — ED Provider Notes (Signed)
 "  [facility] Provider Note    Event Date/Time   First MD Initiated Contact with Patient 06/24/24 [phone]     (approximate)   History   Shortness of Breath   HPI  Joy Ball is a 59 y.o. female   Past medical history of interstitial lung disease/pulmonary fibrosis, recent hospitalization for pneumonia, lung cancer status post radiation, previous smoker with a 40-year pack history, here with shortness of breath.  Worsened over the last 1 day.  Chest tightness as well.  Mild cough nonproductive.  No GI or GU complaints.  Hypoxemic to 70%.  Put on nonrebreather with improvement to the mid 90s.  EMS gave DuoNeb treatment.  Was recently hospitalized and discharged just before Christmas time with similar symptomatology found to have acute on chronic respiratory failure with hypoxemia, due to community-acquired pneumonia, COPD with acute exacerbation, and a consideration for CHF given findings on CT of the chest but ultimately determined not to be due to pulmonary edema.   External Medical Documents Reviewed: Medical chart review including hospitalization notes and discharge summary dated 06/15/2024 for recent respiratory illness requiring admission      Physical Exam   Triage Vital Signs: ED Triage Vitals  Encounter Vitals Group     BP 06/24/24 0537 110/73     Girls Systolic BP Percentile --      Girls Diastolic BP Percentile --      Boys Systolic BP Percentile --      Boys Diastolic BP Percentile --      Pulse Rate 06/24/24 0537 (!) 109     Resp 06/24/24 0537 (!) 36     Temp --      Temp src --      SpO2 06/24/24 0534 (!) 85 %     Weight --      Height --      Head Circumference --      Peak Flow --      Pain Score 06/24/24 0538 7     Pain Loc --      Pain Education --      Exclude from Growth Chart --     Most recent vital signs: Vitals:   06/24/24 0543 06/24/24 0630  BP:  106/69  Pulse: (!) 112 (!) 116  Resp: (!) 35 (!) 41   Temp:  98.5 F (36.9 C)  SpO2: (!) 84% (!) 88%    General: Awake, no distress.  CV:  Good peripheral perfusion.  Resp:  Normal effort.  Abd:  No distention.  Other:  Increased work of of breathing speaking short phrases with tachypnea, tachycardia.  Fine crackles at lung bases bilaterally.  No obvious focality.  She appears euvolemic overall with no peripheral edema noted.  She is hypoxemic but improved with oxygen  therapy.   ED Results / Procedures / Treatments   Labs (all labs ordered are listed, but only abnormal results are displayed) Labs Reviewed  LACTIC ACID, PLASMA - Abnormal; Notable for the following components:      Result Value   Lactic Acid, Venous 2.1 (*)    All other components within normal limits  COMPREHENSIVE METABOLIC PANEL WITH GFR - Abnormal; Notable for the following components:   Alkaline Phosphatase 183 (*)    All other components within normal limits  CBC WITH DIFFERENTIAL/PLATELET - Abnormal; Notable for the following components:   WBC 17.8 (*)    Neutro Abs 15.4 (*)    Abs Immature Granulocytes 0.10 (*)  All other components within normal limits  BLOOD GAS, VENOUS - Abnormal; Notable for the following components:   pO2, Ven 46 (*)    All other components within normal limits  PRO BRAIN NATRIURETIC PEPTIDE - Abnormal; Notable for the following components:   Pro Brain Natriuretic Peptide 338.0 (*)    All other components within normal limits  CULTURE, BLOOD (ROUTINE X 2)  CULTURE, BLOOD (ROUTINE X 2)  RESP PANEL BY RT-PCR (RSV, FLU A&B, COVID)  RVPGX2  PROTIME-INR  LACTIC ACID, PLASMA  URINALYSIS, W/ REFLEX TO CULTURE (INFECTION SUSPECTED)  TROPONIN T, HIGH SENSITIVITY     EKG  ED ECG REPORT I, Ginnie Shams, the attending physician, personally viewed and interpreted this ECG.   Date: 06/24/2024  EKG Time: 0549  Rate: 109  Rhythm: Sinus tachycardia versus atrial fibrillation (read as atrial fibrillation by EKG interpretation, however  with regular intervals and on cardiac monitoring appears to have P waves more likely to represent sinus tachycardia)  Axis: nl  Intervals:nl  ST&T Change: no stemi    RADIOLOGY I independently reviewed and interpreted chest x-ray and I see diffuse mild opacities which may represent multifocal pneumonia or her underlying pulmonary fibrosis I also reviewed radiologist's formal read.   PROCEDURES:  Critical Care performed: Yes, see critical care procedure note(s)  .Critical Care  Performed by: Shams Ginnie, MD Authorized by: Shams Ginnie, MD   Critical care provider statement:    Critical care time (minutes):  40   Critical care was time spent personally by me on the following activities:  Development of treatment plan with patient or surrogate, discussions with consultants, evaluation of patient's response to treatment, examination of patient, ordering and review of laboratory studies, ordering and review of radiographic studies, ordering and performing treatments and interventions, pulse oximetry, re-evaluation of patient's condition and review of old charts    MEDICATIONS ORDERED IN ED: Medications  lactated ringers  bolus 500 mL (has no administration in time range)  levofloxacin  (LEVAQUIN ) IVPB 750 mg (750 mg Intravenous New Bag/Given 06/24/24 0625)  ipratropium-albuterol  (DUONEB) 0.5-2.5 (3) MG/3ML nebulizer solution 9 mL (9 mLs Nebulization Given 06/24/24 0610)  methylPREDNISolone  sodium succinate (SOLU-MEDROL ) 125 mg/2 mL injection 125 mg (125 mg Intravenous Given 06/24/24 0611)  LORazepam  (ATIVAN ) injection 1 mg (1 mg Intravenous Given 06/24/24 9297)    External physician / consultants:  I spoke with hospital medicine for admission and regarding care plan for this patient.   IMPRESSION / MDM / ASSESSMENT AND PLAN / ED COURSE  I reviewed the triage vital signs and the nursing notes.                                Patient's presentation is most consistent with acute presentation  with potential threat to life or bodily function.  Differential diagnosis includes, but is not limited to, respiratory infection, COPD exacerbation, ACS, pneumothorax, considered but less likely PE, CHF exacerbation   The patient is on the cardiac monitor to evaluate for evidence of arrhythmia and/or significant heart rate changes.  MDM:    Patient with ILD and pulmonary fibrosis and COPD here with likely COPD exacerbation or respiratory infection given worsening cough, shortness of breath, with a similar presentation just discharged approximately 1 week ago.  Given DuoNebs, Solu-Medrol , put on BiPAP immediately.  Check labs, sepsis labs including blood cultures, lactic, viral swabs, chest x-ray.  Given levofloxacin  for likely respiratory source of infection.  Considered  sepsis given vital sign abnormalities including tachycardia and tachypnea.  Will start with a small fluid bolus given no evidence of hypoperfusion will defer the full 30 cc/kg ideal body weight fluid bolus as there was some consideration for CHF/pulmonary edema on her last presentation.   Chest tightness is more likely due to her respiratory infection/COPD rather than ACS, but will check EKG and serial troponin.  Considered PE but I think less likely especially in light of normal CTA with no PE noted on imaging just a couple of days ago with similar presentation.  Considered CHF exacerbation but during last admission this was considered but deemed less likely despite CT angiogram findings of interstitial edema.  She does appear clinically euvolemic with no significant peripheral edema but the fine crackles at lung bases may be suggestive of pulmonary edema versus her pre-existing pulmonary fibrosis.  Notably today's weight is approximately 8 pounds greater than her discharge weight Will check BNP, chest x-ray today.  Will start with a smaller fluid bolus of 500 cc LR (for concerns of sepsis an respiratory infection) and  reassess.  I reassessed her at 6:53 AM. She is feeling about the same. Chest x-ray shows no pulmonary edema. As I discussed CODE STATUS this gets her quite anxious, she keeps repeating I would like to rescind my DNR and when asked about intubation she states that she would like full interventions. She is getting quite anxious and tachypneic when discussing these issues.  I will give 1 mg of IV Ativan  as her breathing appears to be more labored now and may be due to anxiety.    I have changed her CODE STATUS back to full code.    I have consulted with ICU further assessment.  On reassessment she seems to be breathing more comfortably now.  Oxygen  saturation has improved markedly as is her tachypnea.  ICU consultation is pending.  Signed out to morning provider pending reassessment and disposition level of care decision in consultation with ICU consultants.      FINAL CLINICAL IMPRESSION(S) / ED DIAGNOSES   Final diagnoses:  COPD exacerbation (HCC)  Acute hypoxemic respiratory failure (HCC)  Respiratory distress     Rx / DC Orders   ED Discharge Orders     None        Note:  This document was prepared using Dragon voice recognition software and may include unintentional dictation errors.    Cyrena Mylar, MD 06/24/24 [phone]  "

## 2024-06-24 NOTE — Progress Notes (Signed)
 RN called stating pt was having a hard time breathing and her Sp02 was very low.  Upon entering pt's room her HR was in the high 120's, RR's in the high 40's and her Sp02 was 75%.  I made changes on the bipap which are documented.  I changed pt's pulse ox probe and pt was given ativan  to help her relax.  Will continue to monitor pt

## 2024-06-24 NOTE — Progress Notes (Signed)
 Rt assisted with pt transport from ER to ICU with no complications. Pt transported on a V60. Total time spent with pt: .

## 2024-06-24 NOTE — Progress Notes (Signed)
" °   06/24/24 1300  Spiritual Encounters  Type of Visit Initial  Care provided to: Pt and family (Daughter and Daughter-in-Law at bedside)  Conversation partners present during encounter Nurse  Referral source Family  Reason for visit Urgent spiritual support  OnCall Visit Yes  Spiritual Framework  Presenting Themes Goals in life/care  Interventions  Spiritual Care Interventions Made Established relationship of care and support;Compassionate presence;Reflective listening;Encouragement  Intervention Outcomes  Outcomes Connection to spiritual care;Connection to values and goals of care;Awareness around self/spiritual resourses;Awareness of support    "

## 2024-06-24 NOTE — H&P (Signed)
"  NAME:  Joy Ball, MRN:  [id number], DOB:  July 28, 1965, LOS: 0 ADMISSION DATE:  06/24/2024, CONSULTATION DATE:  06/25/2023 REFERRING MD:  Dr. Cyrena, CHIEF COMPLAINT:  Shortness of breath, hypoxia   Brief Pt Description / Synopsis:  59 y.o female with PMHx significant for COPD, ILD, and lung cancer admitted with Acute Hypoxic Respiratory Failure due to Acute COPD Exacerbation, questionable ILD Flare, and questionable HCAP requiring BiPAP.  High risk for intubation.   History of Present Illness:  Joy Ball is a 60 y.o. female with a past medical history significant for COPD, ILD/pulmonary fibrosis, lung cancer status post radiation, and recent hospitalization for pneumonia who presents to [facility] ED on 06/25/23 with shortness of breath, chest tightness, and nonproductive cough.   She reports that her shortness of breath began yesterday, and has progressively worsened today.  At home she checked her oxygen  saturations was noted to be hypoxic with O2 saturations in the 70's.  She denies palpitations, fever, chills, dysuria, nausea, vomiting, diarrhea, abdominal pain, or any known sick contacts. Given her work of breathing, she was placed on BiPAP on arrival to the ED.  Of note she was recently admitted to hospital and discharged just prior to Christmas for Acute on chronic hypoxic respiratory failure due to community acquired pneumonia and COPD exacerbation.   ED Course: Initial Vital Signs: Temperature 98.5 F, pulse 109, RR 36, BP 110/73, SpO2 85% Significant Labs: BNP 338, HS troponin <15, lactic acid 2.1, procalcitonin 0.22, WBC 17.8 VBG on BiPAP:  pH 7.37/ pCO2 45/ pO2 46/Bicarb 26 Imaging Chest X-ray>>Emphysema. Unchanged, confluent peripheral pleuroparenchymal scarring with architectural distortion and volume loss. Extensive mid and lower lung zone predominant reticular interstitial opacities compatible with pulmonary fibrosis. No pleural effusion. No pneumothorax. Medications Administered:  500cc LR bolus, IV Levaquin , duonebs, 125 mg Solumedrol  She met Sepsis criteria, however decision made to start with a small fluid bolus given no evidence of hypoperfusion will defer the full 30 cc/kg ideal body weight fluid bolus as there was some consideration for CHF/pulmonary edema on her last presentation   PCCM asked to admit for further workup and treatment.  Please see Significant Hospital Events section below for full detailed hospital course.   Pertinent  Medical History   Past Medical History:  Diagnosis Date   Allergy Idk   Arthritis Idk   Asthma    Cancer (HCC)    Cataract Idk   COPD (chronic obstructive pulmonary disease) (HCC)    Dyspnea    Emphysema lung (HCC)    Emphysema of lung (HCC) Idk   GERD (gastroesophageal reflux disease)    Lung cancer (HCC) 2024   Lung nodule    Migraines    Pulmonary fibrosis (HCC)    Seasonal allergies     Micro Data:  1/2: COVID/FLU/RSV PCR>> negative 1/2: Blood cultures>> 1/2: Respiratory Viral Panel>> 1/2: Strep Pneumo urinary antigen>> 1/2: Legionella urinary antigen>> 1/2: MRSA PCR>>  Antimicrobials:   Anti-infectives (From admission, onward)    Start     Dose/Rate Route Frequency Ordered Stop   07/09/2024 0630  azithromycin  (ZITHROMAX ) 500 mg in sodium chloride  0.9 % 250 mL IVPB        500 mg 250 mL/hr over 60 Minutes Intravenous Every 24 hours 06/24/24 0755 06/27/24 0629   06/24/24 0600  levofloxacin  (LEVAQUIN ) IVPB 750 mg        750 mg 100 mL/hr over 90 Minutes Intravenous  Once 06/24/24 0552 06/24/24 0802  Significant Hospital Events: Including procedures, antibiotic start and stop dates in addition to other pertinent events   06/25/2023:  presents to ED with hypoxia and acute respiratory distress, placed on BiPAP. PCCM asked to admit, HIGH RISK FOR INTUBATION   Interim History / Subjective:  As outlined above under Significant Hospital Events section  Objective   Blood pressure 98/67, pulse (!)  123, temperature 98.5 F (36.9 C), temperature source Axillary, resp. rate (!) 33, height 5' (1.524 m), weight 52.4 kg, SpO2 93%.    FiO2 (%):  [50 %] 50 %  No intake or output data in the 24 hours ending 06/24/24 0800 Filed Weights   06/24/24 0553  Weight: 52.4 kg    Examination: General: Critically ill appearing female, sitting in bed, on BiPAP, with moderate respiratory distress HENT: Atraumatic, normocephalic, neck supple, no JVD Lungs: Coarse breath sounds, BiPAP assisted, even, tachypnea, with moderate increased WOB and accessory muscle use Cardiovascular: Tachycardia, Regular rhythm, s1s2, no M/R/G Abdomen: Soft, nontender, nondistended, no guarding or rebound tenderness, BS+ x4 Extremities: Normal bulk and tone, no deformities, no edema, no cyanosis Neuro: Awake and alert, oriented x4, moves all extremities to commands, no focal deficits GU: deferred   Resolved Hospital Problem list     Assessment & Plan:   #Acute Hypoxic Respiratory Failure #Acute COPD Exacerbation #Questionable HCAP #Questionable ILD Flare  PMHx; COPD, ILD/Pulmonary Fibrosis, Lung Cancer  -Supplemental O2 as needed to maintain O2 sats 88 to 92% -BiPAP, wean as tolerated ~ HIGH RISK FOR INTUBATION  -Follow intermittent Chest X-ray & ABG as needed -Bronchodilators  -IV Steroids -ABX as above -Diuresis as BP and renal function permits -Pulmonary toilet as able -PRN Morphine  for air hunger   #Meets SIRS Criteria on presentation (pulse 109, RR 30's, WBC 17.8) #Concern for possible Sepsis due to questionable HCAP -Monitor fever curve -Trend WBC's & Procalcitonin -Follow cultures as above -Continue empiric Azithromycin , Cefepime, Vancomycin  pending cultures & sensitivities     Best Practice (right click and Reselect all SmartList Selections daily)   Diet/type: NPO until respiratory status improved  DVT prophylaxis: LMWH GI prophylaxis: N/A Lines: N/A Foley:  N/A Code Status:  full  code Last date of multidisciplinary goals of care discussion [N/A]  1/2: Pt and family (daughter and pt's significant other) updated at bedside on plan of care.  Labs   CBC: Recent Labs  Lab 06/24/24 0557  WBC 17.8*  NEUTROABS 15.4*  HGB 12.2  HCT 39.0  MCV 99.5  PLT 390    Basic Metabolic Panel: Recent Labs  Lab 06/24/24 0557  NA 140  K 3.7  CL 105  CO2 23  GLUCOSE 88  BUN 18  CREATININE 0.69  CALCIUM 9.7   GFR: Estimated Creatinine Clearance: 55.7 mL/min (by C-G formula based on SCr of 0.69 mg/dL). Recent Labs  Lab 06/24/24 0557  WBC 17.8*  LATICACIDVEN 2.1*    Liver Function Tests: Recent Labs  Lab 06/24/24 0557  AST 29  ALT 11  ALKPHOS 183*  BILITOT 0.4  PROT 6.7  ALBUMIN 3.6   No results for input(s): LIPASE, AMYLASE in the last 168 hours. No results for input(s): AMMONIA in the last 168 hours.  ABG    Component Value Date/Time   PHART 7.51 (H) 06/11/2024 2010   PCO2ART 37 06/11/2024 2010   PO2ART 79 (L) 06/11/2024 2010   HCO3 26.0 06/24/2024 0610   TCO2 25 04/26/2024 1010   ACIDBASEDEF 2.0 04/26/2024 1010   O2SAT 75.4 06/24/2024 0610  Coagulation Profile: Recent Labs  Lab 06/24/24 0557  INR 1.0    Cardiac Enzymes: No results for input(s): CKTOTAL, CKMB, CKMBINDEX, TROPONINI in the last 168 hours.  HbA1C: Hgb A1c MFr Bld  Date/Time Value Ref Range Status  06/10/2024 05:14 PM 5.1 4.8 - 5.6 % Final    Comment:    (NOTE) Diagnosis of Diabetes The following HbA1c ranges recommended by the American Diabetes Association (ADA) may be used as an aid in the diagnosis of diabetes mellitus.  Hemoglobin             Suggested A1C NGSP%              Diagnosis  <5.7                   Non Diabetic  5.7-6.4                Pre-Diabetic  >6.4                   Diabetic  <7.0                   Glycemic control for                       adults with diabetes.    11/20/2022 09:33 AM 5.0 <5.7 % of total Hgb Final     Comment:    For the purpose of screening for the presence of diabetes: . <5.7%       Consistent with the absence of diabetes 5.7-6.4%    Consistent with increased risk for diabetes             (prediabetes) > or =6.5%  Consistent with diabetes . This assay result is consistent with a decreased risk of diabetes. . Currently, no consensus exists regarding use of hemoglobin A1c for diagnosis of diabetes in children. . According to American Diabetes Association (ADA) guidelines, hemoglobin A1c <7.0% represents optimal control in non-pregnant diabetic patients. Different metrics may apply to specific patient populations.  Standards of Medical Care in Diabetes(ADA). .     CBG: No results for input(s): GLUCAP in the last 168 hours.  Review of Systems:   Positives in BOLD: Gen: Denies fever, chills, weight change, fatigue, night sweats HEENT: Denies blurred vision, double vision, hearing loss, tinnitus, sinus congestion, rhinorrhea, sore throat, neck stiffness, dysphagia PULM: Denies shortness of breath, cough, sputum production, hemoptysis, wheezing CV: Denies chest tightness, edema, orthopnea, paroxysmal nocturnal dyspnea, palpitations GI: Denies abdominal pain, nausea, vomiting, diarrhea, hematochezia, melena, constipation, change in bowel habits GU: Denies dysuria, hematuria, polyuria, oliguria, urethral discharge Endocrine: Denies hot or cold intolerance, polyuria, polyphagia or appetite change Derm: Denies rash, dry skin, scaling or peeling skin change Heme: Denies easy bruising, bleeding, bleeding gums Neuro: Denies headache, numbness, weakness, slurred speech, loss of memory or consciousness   Past Medical History:  She,  has a past medical history of Allergy (Idk), Arthritis (Idk), Asthma, Cancer (HCC), Cataract (Idk), COPD (chronic obstructive pulmonary disease) (HCC), Dyspnea, Emphysema lung (HCC), Emphysema of lung (HCC) (Idk), GERD (gastroesophageal reflux disease),  Lung cancer (HCC) (2024), Lung nodule, Migraines, Pulmonary fibrosis (HCC), and Seasonal allergies.   Surgical History:   Past Surgical History:  Procedure Laterality Date   CESAREAN SECTION     EYE SURGERY Bilateral 01/2023   RIGHT HEART CATH Right 04/26/2024   Procedure: RIGHT HEART CATH;  Surgeon: Mady Bruckner, MD;  Location: [facility] INVASIVE CV LAB;  Service: Cardiovascular;  Laterality: Right;   TUBAL LIGATION       Social History:   reports that she quit smoking about 16 months ago. Her smoking use included cigarettes. She started smoking about 41 years ago. She has a 121.3 pack-year smoking history. She has never used smokeless tobacco. She reports current alcohol use. She reports that she does not use drugs.   Family History:  Her family history includes Arthritis in her mother; Asthma in her mother; COPD in her mother; Cancer in her father; Emphysema in her mother; Heart disease in her maternal grandfather; Lung cancer in her father; Miscarriages / Stillbirths in her sister and sister; Varicose Veins in her mother; Vision loss in her mother.   Allergies Allergies[1]   Home Medications  Prior to Admission medications  Medication Sig Start Date End Date Taking? Authorizing Provider  acetaminophen  (TYLENOL ) 500 MG tablet Take 1,500 mg by mouth 3 (three) times daily. 2 to 3 times a day.    [provider]  aspirin  EC 81 MG tablet Take 1 tablet (81 mg total) by mouth daily. Swallow whole. 03/03/24   Darliss Rogue, MD  budesonide -formoterol  (SYMBICORT ) 160-4.5 MCG/ACT inhaler Inhale 2 puffs into the lungs daily. 08/26/23   Isadora Hose, MD  buPROPion  (WELLBUTRIN  SR) 150 MG 12 hr tablet TAKE 1 TABLET(150 MG) BY MOUTH TWICE DAILY 06/22/24   Edman, Marsa PARAS, DO  fluticasone  (FLONASE ) 50 MCG/ACT nasal spray Place 2 sprays into both nostrils daily. Patient taking differently: Place 2 sprays into both nostrils daily as needed for allergies. 03/30/22   Gladis Elsie BROCKS, PA-C  gabapentin  (NEURONTIN ) 300 MG capsule TAKE 1 CAPSULE(300 MG) BY MOUTH AT BEDTIME 05/18/24   Karamalegos, Marsa PARAS, DO  Dextromethorphan -guaiFENesin  20-200 MG/20ML LIQD Take 20 mLs by mouth every 4 (four) hours as needed for cough. 06/15/24   Patel, Sona, MD  ipratropium (ATROVENT ) 0.06 % nasal spray Place 2 sprays into both nostrils 4 (four) times daily as needed (allergies).    [provider]  ipratropium-albuterol  (DUONEB) 0.5-2.5 (3) MG/3ML SOLN Take 3 mLs by nebulization 2 (two) times daily as needed (breathing). 06/15/24   Patel, Sona, MD  loratadine  (CLARITIN ) 10 MG tablet Take 10 mg by mouth daily.    [provider]  naproxen  (NAPROSYN ) 500 MG tablet TAKE 1 TABLET(500 MG) BY MOUTH TWICE DAILY WITH A MEAL 04/05/24   Karamalegos, Marsa PARAS, DO  nerandomilast ([name]) 18 MG tablet Take 18 mg by mouth in the morning and at bedtime. 06/21/24   Jude Harden GAILS, MD  nystatin  (MYCOSTATIN ) 100000 UNIT/ML suspension SHAKE LIQUID AND TAKE 5 ML BY MOUTH FOUR TIMES DAILY Patient taking differently: Take 5 mLs by mouth 4 (four) times daily as needed (thrush). 10/27/23   Karamalegos, Marsa PARAS, DO  omeprazole  (PRILOSEC) 40 MG capsule Take 1 capsule (40 mg total) by mouth in the morning and at bedtime. 03/07/24   Cobb, Comer GAILS, NP  Tiotropium Bromide  Monohydrate (SPIRIVA  RESPIMAT) 2.5 MCG/ACT AERS Inhale 2 puffs into the lungs daily. 08/26/23   Isadora Hose, MD  VENTOLIN  HFA 108 (90 Base) MCG/ACT inhaler INHALE 2 PUFFS INTO THE LUNGS EVERY 6 HOURS AS NEEDED FOR WHEEZING OR SHORTNESS OF BREATH 06/22/24   Edman Marsa PARAS, DO     Critical care time: 55 minutes     Inge Lecher, AGACNP-BC [facility] Prefer epic messenger for cross cover needs If after hours, please call E-link       [1]  Allergies Allergen Reactions   Amoxicillin Hives   "

## 2024-06-24 NOTE — Progress Notes (Signed)
 Pharmacy Antibiotic Note  Joy Ball is a 59 y.o. female admitted on 06/24/2024 with pneumonia.  Pharmacy has been consulted for vancomycin dosing.  Plan: Vancomycin 1000 mg IV every 24 hours. Cefepime 2000 mg IV Every 12 hours Monitor labs, c/s, and vanco levels as indicated.  Height: 5' (152.4 cm) Weight: 52.4 kg (115 lb 8 oz) IBW/kg (Calculated) : 45.5  Temp (24hrs), Avg:98.5 F (36.9 C), Min:98.5 F (36.9 C), Max:98.5 F (36.9 C)  Recent Labs  Lab 06/24/24 0557  WBC 17.8*  CREATININE 0.69  LATICACIDVEN 2.1*    Estimated Creatinine Clearance: 55.7 mL/min (by C-G formula based on SCr of 0.69 mg/dL).    Allergies[1]  Antimicrobials this admission: Vanco 1/2 >> Cefepime 1/2 >> Azith 1/2 >>  Microbiology results: 1/2 BCx: pending 1/2 MRSA PCR: pending Respiratory panel: pending  Thank you for allowing pharmacy to be a part of this patients care.  Elspeth Sour, PharmD Clinical Pharmacist 06/24/2024 8:17 AM     [1]  Allergies Allergen Reactions   Amoxicillin Hives

## 2024-06-25 ENCOUNTER — Inpatient Hospital Stay

## 2024-06-25 ENCOUNTER — Inpatient Hospital Stay: Admit: 2024-06-25 | Discharge: 2024-06-25 | Disposition: A | Attending: Pulmonary Disease

## 2024-06-25 DIAGNOSIS — J9601 Acute respiratory failure with hypoxia: Secondary | ICD-10-CM | POA: Diagnosis not present

## 2024-06-25 DIAGNOSIS — J80 Acute respiratory distress syndrome: Secondary | ICD-10-CM

## 2024-06-25 DIAGNOSIS — J849 Interstitial pulmonary disease, unspecified: Secondary | ICD-10-CM | POA: Diagnosis not present

## 2024-06-25 DIAGNOSIS — R6510 Systemic inflammatory response syndrome (SIRS) of non-infectious origin without acute organ dysfunction: Secondary | ICD-10-CM | POA: Diagnosis not present

## 2024-06-25 DIAGNOSIS — R0603 Acute respiratory distress: Secondary | ICD-10-CM

## 2024-06-25 DIAGNOSIS — R579 Shock, unspecified: Secondary | ICD-10-CM

## 2024-06-25 DIAGNOSIS — J441 Chronic obstructive pulmonary disease with (acute) exacerbation: Secondary | ICD-10-CM | POA: Diagnosis not present

## 2024-06-25 LAB — ECHOCARDIOGRAM COMPLETE
AR max vel: 1.88 cm2
AV Peak grad: 9.5 mmHg
Ao pk vel: 1.54 m/s
Area-P 1/2: 4.06 cm2
Height: 60 in
S' Lateral: 2.2 cm
Weight: 1823.65 [oz_av]

## 2024-06-25 LAB — BLOOD GAS, ARTERIAL
Acid-Base Excess: 0.8 mmol/L (ref 0.0–2.0)
Acid-base deficit: 0.2 mmol/L (ref 0.0–2.0)
Acid-base deficit: 2 mmol/L (ref 0.0–2.0)
Bicarbonate: 27.1 mmol/L (ref 20.0–28.0)
Bicarbonate: 29.5 mmol/L — ABNORMAL HIGH (ref 20.0–28.0)
Bicarbonate: 29.9 mmol/L — ABNORMAL HIGH (ref 20.0–28.0)
FIO2: 100 %
FIO2: 100 %
FIO2: 100 %
MECHVT: 350 mL
MECHVT: 300 mL
Mechanical Rate: 28
Mechanical Rate: 28
Mechanical Rate: 28
O2 Saturation: 92.8 %
O2 Saturation: 96.1 %
O2 Saturation: 92 %
PEEP: 5 cmH2O
PEEP: 5 cmH2O
PEEP: 10 cmH2O
Patient temperature: 37
Patient temperature: 37
Patient temperature: 37
RATE: 24 {breaths}/min
Spontaneous VT: 350 mL
pCO2 arterial: 49 mmHg — ABNORMAL HIGH (ref 32–48)
pCO2 arterial: 72 mmHg (ref 32–48)
pCO2 arterial: 92 mmHg (ref 32–48)
pH, Arterial: 7.35 (ref 7.35–7.45)
pH, Arterial: 7.22 — ABNORMAL LOW (ref 7.35–7.45)
pH, Arterial: 7.12 — CL (ref 7.35–7.45)
pO2, Arterial: 66 mmHg — ABNORMAL LOW (ref 83–108)
pO2, Arterial: 101 mmHg (ref 83–108)
pO2, Arterial: 72 mmHg — ABNORMAL LOW (ref 83–108)

## 2024-06-25 LAB — RENAL FUNCTION PANEL
Albumin: 3.1 g/dL — ABNORMAL LOW (ref 3.5–5.0)
Anion gap: 8 (ref 5–15)
BUN: 17 mg/dL (ref 6–20)
CO2: 26 mmol/L (ref 22–32)
Calcium: 9.2 mg/dL (ref 8.9–10.3)
Chloride: 103 mmol/L (ref 98–111)
Creatinine, Ser: 0.56 mg/dL (ref 0.44–1.00)
GFR, Estimated: >60 mL/min
Glucose, Bld: 122 mg/dL — ABNORMAL HIGH (ref 70–99)
Phosphorus: 4.4 mg/dL (ref 2.5–4.6)
Potassium: 4.4 mmol/L (ref 3.5–5.1)
Sodium: 137 mmol/L (ref 135–145)

## 2024-06-25 LAB — GLUCOSE, CAPILLARY
Glucose-Capillary: 107 mg/dL — ABNORMAL HIGH (ref 70–99)
Glucose-Capillary: 131 mg/dL — ABNORMAL HIGH (ref 70–99)
Glucose-Capillary: 171 mg/dL — ABNORMAL HIGH (ref 70–99)

## 2024-06-25 LAB — CBC
HCT: 33.6 % — ABNORMAL LOW (ref 36.0–46.0)
Hemoglobin: 10.3 g/dL — ABNORMAL LOW (ref 12.0–15.0)
MCH: 30.8 pg (ref 26.0–34.0)
MCHC: 30.7 g/dL (ref 30.0–36.0)
MCV: 100.6 fL — ABNORMAL HIGH (ref 80.0–100.0)
Platelets: 481 K/uL — ABNORMAL HIGH (ref 150–400)
RBC: 3.34 MIL/uL — ABNORMAL LOW (ref 3.87–5.11)
RDW: 14.2 % (ref 11.5–15.5)
WBC: 25 K/uL — ABNORMAL HIGH (ref 4.0–10.5)
nRBC: 0 % (ref 0.0–0.2)

## 2024-06-25 LAB — LEGIONELLA PNEUMOPHILA SEROGP 1 UR AG: L. pneumophila Serogp 1 Ur Ag: NEGATIVE

## 2024-06-25 LAB — C-REACTIVE PROTEIN: CRP: 25.9 mg/dL — ABNORMAL HIGH

## 2024-06-25 LAB — STREP PNEUMONIAE URINARY ANTIGEN: Strep Pneumo Urinary Antigen: NEGATIVE

## 2024-06-25 LAB — MAGNESIUM: Magnesium: 2.1 mg/dL (ref 1.7–2.4)

## 2024-06-25 LAB — SEDIMENTATION RATE: Sed Rate: 54 mm/h — ABNORMAL HIGH (ref 0–30)

## 2024-06-25 MED ORDER — METHYLPREDNISOLONE SODIUM SUCC 40 MG IJ SOLR
40.0000 mg | Freq: Two times a day (BID) | INTRAMUSCULAR | Status: DC
  Administered 2024-06-25: 40 mg via INTRAVENOUS
  Filled 2024-06-25: qty 1

## 2024-06-25 MED ORDER — FUROSEMIDE 10 MG/ML IJ SOLN
20.0000 mg | Freq: Once | INTRAMUSCULAR | Status: AC
  Administered 2024-06-25: 20 mg via INTRAVENOUS
  Filled 2024-06-25: qty 2

## 2024-06-25 MED ORDER — MIDAZOLAM HCL (PF) 2 MG/2ML IJ SOLN: 2.0000 mg | INTRAMUSCULAR | Status: DC | PRN

## 2024-06-25 MED ORDER — POLYVINYL ALCOHOL 1.4 % OP SOLN: 1.0000 [drp] | Freq: Four times a day (QID) | OPHTHALMIC | Status: DC | PRN

## 2024-06-25 MED ORDER — SODIUM CHLORIDE 0.9 % IV SOLN: INTRAVENOUS | Status: DC

## 2024-06-25 MED ORDER — ROCURONIUM BROMIDE 10 MG/ML (PF) SYRINGE
PREFILLED_SYRINGE | INTRAVENOUS | Status: AC
  Administered 2024-06-25: 60 mg via INTRAVENOUS
  Filled 2024-06-25: qty 10

## 2024-06-25 MED ORDER — ROCURONIUM BROMIDE 10 MG/ML (PF) SYRINGE: 50.0000 mg | PREFILLED_SYRINGE | Freq: Once | INTRAVENOUS | Status: AC

## 2024-06-25 MED ORDER — GLYCOPYRROLATE 1 MG PO TABS: 1.0000 mg | ORAL_TABLET | ORAL | Status: DC | PRN

## 2024-06-25 MED ORDER — ROCURONIUM BROMIDE 10 MG/ML (PF) SYRINGE
PREFILLED_SYRINGE | INTRAVENOUS | Status: AC
  Administered 2024-06-25: 50 mg via INTRAVENOUS
  Filled 2024-06-25: qty 10

## 2024-06-25 MED ORDER — VASOPRESSIN 20 UNITS/100 ML INFUSION FOR SHOCK
0.0000 [IU]/min | INTRAVENOUS | Status: DC
  Filled 2024-06-25: qty 100

## 2024-06-25 MED ORDER — IPRATROPIUM-ALBUTEROL 0.5-2.5 (3) MG/3ML IN SOLN
9.0000 mL | Freq: Once | RESPIRATORY_TRACT | Status: AC
  Administered 2024-06-25: 9 mL via RESPIRATORY_TRACT
  Filled 2024-06-25: qty 9

## 2024-06-25 MED ORDER — GLYCOPYRROLATE 0.2 MG/ML IJ SOLN: 0.2000 mg | INTRAMUSCULAR | Status: DC | PRN

## 2024-06-25 MED ORDER — FENTANYL 2500MCG IN NS 250ML (10MCG/ML) PREMIX INFUSION: 0.0000 ug/h | INTRAVENOUS | Status: DC

## 2024-06-25 MED ORDER — ACETAMINOPHEN 650 MG RE SUPP: 650.0000 mg | Freq: Four times a day (QID) | RECTAL | Status: DC | PRN

## 2024-06-25 MED ORDER — FENTANYL BOLUS VIA INFUSION: 100.0000 ug | INTRAVENOUS | Status: DC | PRN

## 2024-06-25 MED ORDER — VECURONIUM BROMIDE 10 MG IV SOLR: 10.0000 mg | Freq: Once | INTRAVENOUS | Status: AC

## 2024-06-25 MED ORDER — IPRATROPIUM-ALBUTEROL 0.5-2.5 (3) MG/3ML IN SOLN
3.0000 mL | Freq: Four times a day (QID) | RESPIRATORY_TRACT | Status: DC
  Administered 2024-06-25: 3 mL via RESPIRATORY_TRACT
  Filled 2024-06-25: qty 3

## 2024-06-25 MED ORDER — SODIUM CHLORIDE 0.9 % IV SOLN
1000.0000 mg | Freq: Every day | INTRAVENOUS | Status: DC
  Administered 2024-06-25: 1000 mg via INTRAVENOUS
  Filled 2024-06-25: qty 16

## 2024-06-25 MED ORDER — NOREPINEPHRINE 4 MG/250ML-% IV SOLN
0.0000 ug/min | INTRAVENOUS | Status: DC
  Administered 2024-06-25: 11 ug/min via INTRAVENOUS

## 2024-06-25 MED ORDER — PROPOFOL 1000 MG/100ML IV EMUL
0.0000 ug/kg/min | INTRAVENOUS | Status: DC
  Administered 2024-06-25: 40 ug/kg/min via INTRAVENOUS
  Administered 2024-06-25: 15 ug/kg/min via INTRAVENOUS
  Filled 2024-06-25 (×2): qty 100

## 2024-06-25 MED ORDER — SODIUM CHLORIDE 0.9 % IV SOLN: 1000.0000 mg | Freq: Every day | INTRAVENOUS | Status: DC

## 2024-06-25 MED ORDER — ROCURONIUM BROMIDE 10 MG/ML (PF) SYRINGE: 60.0000 mg | PREFILLED_SYRINGE | Freq: Once | INTRAVENOUS | Status: AC

## 2024-06-25 MED ORDER — VECURONIUM BROMIDE 10 MG IV SOLR
INTRAVENOUS | Status: AC
  Administered 2024-06-25: 10 mg via INTRAVENOUS
  Filled 2024-06-25: qty 10

## 2024-06-25 MED ORDER — METHYLPREDNISOLONE SODIUM SUCC 40 MG IJ SOLR: 40.0000 mg | Freq: Every day | INTRAMUSCULAR | Status: DC

## 2024-06-25 MED ORDER — ACETAMINOPHEN 325 MG PO TABS: 650.0000 mg | ORAL_TABLET | Freq: Four times a day (QID) | ORAL | Status: DC | PRN

## 2024-06-26 ENCOUNTER — Other Ambulatory Visit (HOSPITAL_COMMUNITY): Payer: Self-pay

## 2024-06-27 ENCOUNTER — Other Ambulatory Visit: Payer: Self-pay

## 2024-06-27 ENCOUNTER — Other Ambulatory Visit: Payer: Self-pay | Admitting: Pharmacy Technician

## 2024-06-27 NOTE — Progress Notes (Signed)
Disenrolled Patient deceased

## 2024-06-28 LAB — CULTURE, RESPIRATORY W GRAM STAIN: Culture: NO GROWTH

## 2024-06-28 LAB — BLOOD GAS, ARTERIAL
Bicarbonate: 27.6 mmol/L (ref 20.0–28.0)
Mechanical Rate: 28
O2 Saturation: 90.9 % (ref 0.0–2.0)
PEEP: 5 cmH2O
Patient temperature: 37
Patient temperature: 90.9 %
Pressure control: 23 cmH2O
pH, Arterial: 23 cmH2O — AB (ref 7.35–7.45)
pH, Arterial: 69 mmHg (ref 7.35–7.45)
pO2, Arterial: 69 mmHg (ref 83–48)
pO2, Arterial: 69 mmHg — AB (ref 83–28.0)

## 2024-06-29 LAB — FUNGITELL BETA-D-GLUCAN: Fungitell Value:: 50.506 pg/mL

## 2024-06-29 LAB — CULTURE, BLOOD (ROUTINE X 2)
Culture: NO GROWTH
Culture: NO GROWTH
Special Requests: ADEQUATE

## 2024-06-30 ENCOUNTER — Ambulatory Visit: Admitting: Internal Medicine

## 2024-07-12 ENCOUNTER — Encounter

## 2024-07-19 ENCOUNTER — Ambulatory Visit: Admitting: Internal Medicine

## 2024-07-24 NOTE — Procedures (Signed)
 Central Venous Catheter Insertion Procedure Note  Joy Ball  [id number]  May 24, 1966  Date:July 21, 2024  Time:9:36 AM   Provider Performing:Kenta Laster LELON Ball   Procedure: Insertion of Non-tunneled Central Venous Catheter(36556) with US  guidance (23062)   Indication(s) Medication administration  Consent Risks of the procedure as well as the alternatives and risks of each were explained to the patient and/or caregiver.  Consent for the procedure was obtained and is signed in the bedside chart  Anesthesia Topical only with 1% lidocaine    Timeout Verified patient identification, verified procedure, site/side was marked, verified correct patient position, special equipment/implants available, medications/allergies/relevant history reviewed, required imaging and test results available.  Sterile Technique Maximal sterile technique including full sterile barrier drape, hand hygiene, sterile gown, sterile gloves, mask, hair covering, sterile ultrasound probe cover (if used).  Procedure Description Area of catheter insertion was cleaned with chlorhexidine  and draped in sterile fashion.  With real-time ultrasound guidance a central venous catheter was placed into the left internal jugular vein. Nonpulsatile blood flow and easy flushing noted in all ports.  The catheter was sutured in place and sterile dressing applied.  Complications/Tolerance None; patient tolerated the procedure well. Chest X-ray is ordered to verify placement for internal jugular or subclavian cannulation.   Chest x-ray is not ordered for femoral cannulation.  EBL Minimal  Specimen(s) None       Joy Ball, AGACNP-BC [facility]  See Amion for personal pager PCCM on call pager [phone] until 7pm. Please call Elink 7p-7a. [phone]  Jul 21, 2024 9:36 AM

## 2024-07-24 NOTE — IPAL (Signed)
" °  Interdisciplinary Goals of Care Family Meeting   Date carried out: 07/23/24  Location of the meeting: Bedside  Member's involved: Physician and Family Member or next of kin  Durable Power of Attorney or acting medical decision maker: Son     Discussion: We discussed goals of care for [employer] .  We discussed that she has been struggling with interstitial lung disease for years now.  And we are starting with an already weak lungs.  Her DLCO on PFTs is 28% of predicted.  He told me that for the past year she has been declining and now is dependent on 8 L via nasal cannula at night and is barely able to walk from her bedroom to the bathroom.  I explained to him that we are most likely dealing with an ILD flare and we are doing our best to sustain an adequate oxygenation.  But unfortunately we have reached our maximal support.  More importantly I discussed with them that the chances of meaningful recovery meaning preserving a good quality of life is not in her favor.  They are contemplating comfort measures only but will need time and will let us  know when they are ready to proceed.  Code status:   Code Status: Full Code   Disposition: Continue current acute care  Time spent for the meeting: 10 minutes    Darrin Barn, MD  07-23-24, 2:40 PM   "

## 2024-07-24 NOTE — Death Summary Note (Signed)
 " DEATH SUMMARY   Patient Details  Name: Joy Ball MRN: [id number] DOB: 1966-05-26  Admission/Discharge Information   Admit Date:  June 30, 2024  Date of Death: Date of Death: 2024/07/01  Time of Death: Time of Death: 1645-07-19  Length of Stay: 1  Referring Physician: Edman Marsa PARAS, DO   Reason(s) for Hospitalization  Acute hypoxic respiratory failure  Diagnoses  Preliminary cause of death:  Secondary Diagnoses (including complications and co-morbidities):  Principal Problem:   Acute respiratory failure with hypoxia [facility])   Brief Hospital Course (including significant findings, care, treatment, and services provided and events leading to death)  Joy Ball is a 59 y.o. year old female who Case of a 59 year old female patient with a past medical Ball of right upper lobe primary lung adenocarcinoma status postradiation in 07-20-2023, interstitial lung disease with pattern most consistent with UIP, PFTs with mixed obstructive and restrictive pattern and severely reduced DLCO as well as significant response to bronchodilator raising suspicion for underlying COPD/asthma overlap presenting with acute hypoxic respiratory failure due to an ILD flare requiring intubation and mechanical ventilation on 2024/06/30. Unfortunately course complicated by refractory hypoxia requiring paralysis and proning. Family discussions eventually lead to goals of care change to comfort measures only. Patient was terminally extubated on 2024/07/01 and passed away peacefully at 16:47.    Pertinent Labs and Studies  Significant Diagnostic Studies DG Chest Port 1 View Result Date: Jul 01, 2024 CLINICAL DATA:  Adult respiratory distress syndrome. EXAM: PORTABLE CHEST 1 VIEW COMPARISON:  07/01/24, 06-30-2024. FINDINGS: The heart size and mediastinal contours are stable. There is atherosclerotic calcification of the aorta. Interstitial prominence is noted bilaterally with scattered airspace disease, most pronounced at  the left lung base and slightly improved. No effusion or pneumothorax is seen. The endotracheal tube terminates 2.7 cm above the carina. An enteric tube courses over the stomach and out of the field of view. A left internal jugular central venous catheter appear stable. IMPRESSION: 1. Interstitial prominence bilaterally with scattered airspace disease, improved at the left lung base. 2. Support apparatus as described above. Electronically Signed   By: Leita Birmingham M.D.   On: 07/01/24 17:45   ECHOCARDIOGRAM COMPLETE Result Date: 07-01-24    ECHOCARDIOGRAM REPORT   Patient Name:   Joy Ball Date of Exam: 07/01/24 Medical Rec #:  [id number]      Height:       60.0 in Accession #:    [id number]     Weight:       114.0 lb Date of Birth:  [birth year]       BSA:          1.470 m Patient Age:    58 years       BP:           95/67 mmHg Patient Gender: F              HR:           123 bpm. Exam Location:  [facility] Procedure: 2D Echo, 3D Echo, Cardiac Doppler, Color Doppler and Strain Analysis            (Both Spectral and Color Flow Doppler were utilized during            procedure). Indications:     Acutre respiratory distress R06.03  Ball:         Patient has prior Ball of Echocardiogram examinations, most  recent 03/09/2024.  Sonographer:     Thedora Louder RDCS, FASE Referring Phys:  [id number] INGE JONETTA LECHER Diagnosing Phys: Shelda Bruckner MD  Sonographer Comments: Echo performed with patient supine and on artificial respirator. Global longitudinal strain was attempted. IMPRESSIONS  1. Left ventricular ejection fraction, by estimation, is 60 to 65%. The left ventricle has normal function. The left ventricle has no regional wall motion abnormalities. There is mild left ventricular hypertrophy. Left ventricular diastolic parameters were normal. The global longitudinal strain is normal.  2. Right ventricular systolic function is normal. The right ventricular size is normal. There is  severely elevated pulmonary artery systolic pressure. The estimated right ventricular systolic pressure is 60.1 mmHg.  3. The mitral valve is normal in structure. Trivial mitral valve regurgitation. No evidence of mitral stenosis.  4. The aortic valve is tricuspid. Aortic valve regurgitation is trivial. No aortic stenosis is present. Comparison(s): Changes from prior study are noted. PA pressures now severely elevated. Conclusion(s)/Recommendation(s): Normal ventricular function, severely elevated PA pressure. FINDINGS  Left Ventricle: Left ventricular ejection fraction, by estimation, is 60 to 65%. The left ventricle has normal function. The left ventricle has no regional wall motion abnormalities. Strain was performed and the global longitudinal strain is normal. The  global longitudinal strain is normal despite suboptimal segment tracking. The left ventricular internal cavity size was normal in size. There is mild left ventricular hypertrophy. Left ventricular diastolic parameters were normal. Right Ventricle: The right ventricular size is normal. Right vetricular wall thickness was not well visualized. Right ventricular systolic function is normal. There is severely elevated pulmonary artery systolic pressure. The tricuspid regurgitant velocity is 3.61 m/s, and with an assumed right atrial pressure of 8 mmHg, the estimated right ventricular systolic pressure is 60.1 mmHg. Left Atrium: Left atrial size was normal in size. Right Atrium: Right atrial size was normal in size. Pericardium: Trivial pericardial effusion is present. Mitral Valve: The mitral valve is normal in structure. Trivial mitral valve regurgitation. No evidence of mitral valve stenosis. Tricuspid Valve: The tricuspid valve is grossly normal. Tricuspid valve regurgitation is mild. Aortic Valve: The aortic valve is tricuspid. Aortic valve regurgitation is trivial. No aortic stenosis is present. Aortic valve peak gradient measures 9.5 mmHg. Pulmonic  Valve: The pulmonic valve was grossly normal. Pulmonic valve regurgitation is trivial. No evidence of pulmonic stenosis. Aorta: The aortic root and ascending aorta are structurally normal, with no evidence of dilitation. Venous: IVC assessment for right atrial pressure unable to be performed due to mechanical ventilation. IAS/Shunts: The atrial septum is grossly normal. Additional Comments: 3D was performed not requiring image post processing on an independent workstation and was normal.  LEFT VENTRICLE PLAX 2D LVIDd:         3.10 cm   Diastology LVIDs:         2.20 cm   LV e' medial:    14.40 cm/s LV PW:         1.20 cm   LV E/e' medial:  4.7 LV IVS:        1.30 cm   LV e' lateral:   8.70 cm/s LVOT diam:     1.80 cm   LV E/e' lateral: 7.8 LV SV:         40 LV SV Index:   28 LVOT Area:     2.54 cm                           3D  Volume EF:                          3D EF:        59 %                          LV EDV:       66 ml                          LV ESV:       27 ml                          LV SV:        39 ml RIGHT VENTRICLE RV Basal diam:  2.80 cm RV S prime:     12.40 cm/s TAPSE (M-mode): 1.4 cm LEFT ATRIUM             Index        RIGHT ATRIUM          Index LA diam:        1.70 cm 1.16 cm/m   RA Area:     9.39 cm LA Vol (A2C):   16.1 ml 10.95 ml/m  RA Volume:   18.40 ml 12.52 ml/m LA Vol (A4C):   10.5 ml 7.14 ml/m LA Biplane Vol: 13.1 ml 8.91 ml/m  AORTIC VALVE                 PULMONIC VALVE AV Area (Vmax): 1.88 cm     PV Vmax:          0.88 m/s AV Vmax:        154.00 cm/s  PV Peak grad:     3.1 mmHg AV Peak Grad:   9.5 mmHg     PR End Diast Vel: 16.16 msec LVOT Vmax:      114.00 cm/s  RVOT Peak grad:   2 mmHg LVOT Vmean:     74.400 cm/s LVOT VTI:       0.159 m  AORTA Ao Root diam: 3.30 cm Ao Asc diam:  3.20 cm MITRAL VALVE                TRICUSPID VALVE MV Area (PHT): 4.06 cm     TR Peak grad:   52.1 mmHg MV Decel Time: 187 msec     TR Vmax:        361.00 cm/s MV E velocity: 67.60 cm/s MV A  velocity: 111.00 cm/s  SHUNTS MV E/A ratio:  0.61         Systemic VTI:  0.16 m                             Systemic Diam: 1.80 cm Shelda Bruckner MD Electronically signed by Shelda Bruckner MD Signature Date/Time: 07-02-2024/3:49:06 PM    Final    DG Chest Port 1 View Result Date: 07/02/24 EXAM: 1 VIEW(S) XRAY OF THE CHEST 07-02-24 09:47:00 AM COMPARISON: Chest x-ray 06/24/2024, chest x-ray 06/11/2024, CT angio chest 06/10/2024. CLINICAL Ball: Encounter for central line placement. FINDINGS: LINES, TUBES AND DEVICES: Endotracheal tube in place with tip 2 cm above the carina. Enteric tube in place extending below the diaphragm with side port overlying the expected region of the gastric lumen and tip just collimated off view.  Interval placement of left IJ central venous catheter with tip projecting over the superior cavoatrial junction. LUNGS AND PLEURA: Stable bilateral airspace opacities. Chronic underlying interstitial disease. No pleural effusion. No pneumothorax. HEART AND MEDIASTINUM: No acute abnormality of the cardiac and mediastinal silhouettes. BONES AND SOFT TISSUES: No acute osseous abnormality. IMPRESSION: 1. Lines and tubes as above. 2. Stable bilateral airspace opacities. Electronically signed by: Morgane Naveau MD June 29, 2024 01:27 PM EST RP Workstation: [id number]   DG CHEST PORT 1 VIEW Result Date: 06/24/2024 EXAM: 1 VIEW(S) XRAY OF THE CHEST 06/24/2024 06:25:09 PM COMPARISON: Comparison is made to the study dated 06/24/2024. CLINICAL Ball: [id number] Intubation of airway performed without difficulty [id number] Intubation of airway performed without difficulty FINDINGS: LINES, TUBES AND DEVICES: Endotracheal tube is 2 cm above the carina. Og tube tip is in the stomach. LUNGS AND PLEURA: Bilateral mid and lower lung airspace disease, left greater than right, worsening since prior study. Areas of scarring in the upper lobes bilaterally. Emphysema. No pleural effusion. No pneumothorax.  HEART AND MEDIASTINUM: No acute abnormality of the cardiac and mediastinal silhouettes. BONES AND SOFT TISSUES: No acute osseous abnormality. IMPRESSION: 1. Endotracheal tube tip terminates 2 cm above the carina. 2. Orogastric tube tip terminates in the stomach. 3. Bilateral mid and lower lung airspace disease, left greater than right, worsening from prior. Electronically signed by: Franky Crease MD 06/24/2024 07:17 PM EST RP Workstation: [id number]   CT Angio Chest Pulmonary Embolism (PE) W or WO Contrast Result Date: 06/24/2024 EXAM: CTA CHEST 06/24/2024 06:58:22 PM TECHNIQUE: CTA of the chest was performed after the administration of intravenous contrast. Multiplanar reformatted images are provided for review. MIP images are provided for review. Automated exposure control, iterative reconstruction, and/or weight based adjustment of the mA/kV was utilized to reduce the radiation dose to as low as reasonably achievable. COMPARISON: 06/10/2024 CLINICAL Ball: Pulmonary embolism (PE) suspected, high prob. FINDINGS: PULMONARY ARTERIES: Pulmonary arteries are adequately opacified for evaluation. No acute pulmonary embolus. Main pulmonary artery is normal in caliber. MEDIASTINUM: The heart and pericardium demonstrate no acute abnormality. There is no acute abnormality of the thoracic aorta. Endotracheal tube tip is in the lower trachea. LYMPH NODES: Scattered borderline in size mediastinal lymph nodes are stable since the prior study, likely reactive. No hilar or axillary lymphadenopathy. LUNGS AND PLEURA: Emphysema. Extensive ground glass airspace opacities in both lungs lower lungs, worsening since prior study. Chronic densities in the upper lobes bilaterally likely scarring. No focal consolidation or pulmonary edema. No pleural effusion or pneumothorax. UPPER ABDOMEN: Limited images of the upper abdomen are unremarkable. SOFT TISSUES AND BONES: No acute bone or soft tissue abnormality. IMPRESSION: 1. No pulmonary  embolism. 2. Extensive ground glass airspace opacities in both lower lungs, worsening since prior study. This could reflect edema or infection. 3. Endotracheal tube tip in the lower trachea. 4. Emphysema, which is an independent risk factor for lung cancer, and recommend consideration for evaluation for a low-dose CT lung cancer screening program. Electronically signed by: Franky Crease MD 06/24/2024 07:16 PM EST RP Workstation: [id number]   DG Abd 1 View Result Date: 06/24/2024 EXAM: 1 VIEW XRAY OF THE ABDOMEN 06/24/2024 06:25:09 PM COMPARISON: None available. CLINICAL Ball: [id number] Intubation of airway performed without difficulty [id number] Intubation of airway performed without difficulty FINDINGS: LINES, TUBES AND DEVICES: OG tube tip is in the mid stomach. BOWEL: Nonobstructive bowel gas pattern. SOFT TISSUES: No abnormal calcifications. BONES: No acute fracture. IMPRESSION: 1. OG tube tip in the mid stomach. 2. Nonobstructive bowel  gas pattern. Electronically signed by: Franky Crease MD 06/24/2024 07:13 PM EST RP Workstation: [id number]   US  Venous Img Lower Bilateral (DVT) Result Date: 06/24/2024 CLINICAL DATA:  Edema and shortness of breath EXAM: Bilateral Lower Extremity Venous Doppler Ultrasound TECHNIQUE: Gray-scale sonography with compression, as well as color and duplex ultrasound, were performed to evaluate the deep venous system(s) from the level of the common femoral vein through the popliteal and proximal calf veins. COMPARISON:  None available FINDINGS: VENOUS Normal compressibility of the common femoral, superficial femoral, and popliteal veins, as well as the visualized calf veins. Visualized portions of profunda femoral vein and great saphenous vein unremarkable. No filling defects to suggest DVT on grayscale or color Doppler imaging. Doppler waveforms show normal direction of venous flow, normal respiratory plasticity and response to augmentation. OTHER None. Limitations: none IMPRESSION: No  lower extremity DVT. Electronically Signed   By: Aliene Lloyd M.D.   On: 06/24/2024 17:33   DG Chest Port 1 View Result Date: 06/24/2024 EXAM: 1 VIEW(S) XRAY OF THE CHEST 06/24/2024 06:13:19 AM COMPARISON: 06/11/2024 CLINICAL Ball: Questionable sepsis - evaluate for abnormality FINDINGS: LUNGS AND PLEURA: Emphysema. Unchanged, confluent peripheral pleuroparenchymal scarring with architectural distortion and volume loss. Extensive mid and lower lung zone predominant reticular interstitial opacities compatible with pulmonary fibrosis. No pleural effusion. No pneumothorax. HEART AND MEDIASTINUM: No acute abnormality of the cardiac and mediastinal silhouettes. BONES AND SOFT TISSUES: No acute osseous abnormality. IMPRESSION: 1. No acute interval change compared to 06/11/2024. Electronically signed by: Waddell Calk MD 06/24/2024 06:19 AM EST RP Workstation: [id number]   DG Chest 1 View Result Date: 06/11/2024 EXAM: 1 VIEW(S) XRAY OF THE CHEST 06/11/2024 08:34:42 PM COMPARISON: 06/09/2024 CLINICAL Ball: SOB (shortness of breath) FINDINGS: LUNGS AND PLEURA: Stable lower lung predominant interstitial opacities. Biapical pleural thickening and scarring. No pleural effusion. No pneumothorax. HEART AND MEDIASTINUM: No acute abnormality of the cardiac and mediastinal silhouettes. BONES AND SOFT TISSUES: No acute osseous abnormality. IMPRESSION: 1. Stable lower lung predominant interstitial opacities and biapical pleural thickening and scarring. Electronically signed by: Oneil Devonshire MD 06/11/2024 08:40 PM EST RP Workstation: [id number]   CT Angio Chest PE W/Cm &/Or Wo Cm Result Date: 06/10/2024 EXAM: CTA of the Chest with contrast for PE 06/10/2024 02:06:34 AM TECHNIQUE: CTA of the chest was performed without and with the administration of 75 mL of intravenous iohexol  (OMNIPAQUE ) 350 MG/ML injection. Multiplanar reformatted images are provided for review. MIP images are provided for review. Automated exposure  control, iterative reconstruction, and/or weight based adjustment of the mA/kV was utilized to reduce the radiation dose to as low as reasonably achievable. COMPARISON: 02/12/2024 plain film from the previous day. Joy Ball: Shortness of breath. FINDINGS: PULMONARY ARTERIES: Pulmonary arteries are adequately opacified for evaluation. The pulmonary artery shows a normal branching pattern. No filling defect to suggest pulmonary embolism is noted. Main pulmonary artery is normal in caliber. MEDIASTINUM: The heart is at the upper limits of normal in size. Mild coronary calcifications are seen. Thoracic aorta shows atherosclerotic calcifications without an aneurysmal dilatation. The esophagus is within normal limits. LYMPH NODES: Scattered mediastinal lymph nodes are identified and stable from the prior exam. These are likely reactive in nature. The largest of these lies in the subcarinal region measuring up to 15 mm. No hilar or axillary lymphadenopathy. LUNGS AND PLEURA: The lungs are well aerated bilaterally. Bronchiectatic changes are seen. Diffuse emphysematous changes are noted. Right apical scarring is again seen, consistent with the patient's given clinical Ball and  likely post-irradiation therapy. Infiltrative changes are seen in the lateral aspect of the left upper lobe new when compared with the prior study, likely representing acute infiltrate. Diffuse interstitial edema is noted particularly in the lower lobes bilaterally. No pleural effusion or pneumothorax. UPPER ABDOMEN: The visualized upper abdomen is within normal limits. SOFT TISSUES AND BONES: No acute bone or soft tissue abnormality. No bony abnormality is noted. IMPRESSION: 1. No evidence of pulmonary embolism. 2. Diffuse interstitial edema, particularly in the lower lobes bilaterally. No effusion. 3. Likely acute infiltrate in the lateral aspect of the left upper lobe. 4. Chronic changes in the right apex. Electronically signed by: Oneil Devonshire MD 06/10/2024 02:16 AM EST RP Workstation: [id number]   DG Chest Port 1 View Result Date: 06/09/2024 EXAM: 1 VIEW(S) XRAY OF THE CHEST 06/09/2024 11:30:46 PM COMPARISON: Chest x-ray 05/31/2024 and chest CT 02/12/2024. CLINICAL Ball: shob copd FINDINGS: LUNGS AND PLEURA: Stable right upper lobe parenchymal opacity. Stable strandy and patchy opacities in the peripheral left upper lobe. Chronic interstitial opacities in the lung bases compatible with fibrosis as seen on the prior CT. No new focal lung infiltrate. No pleural effusion. No pneumothorax. HEART AND MEDIASTINUM: No acute abnormality of the cardiac and mediastinal silhouettes. BONES AND SOFT TISSUES: No acute osseous abnormality. IMPRESSION: 1. Stable bilateral upper lobes parenchymal opacities. 2. Chronic bibasilar interstitial opacities consistent with fibrosis. 3. No new focal lung infiltrate, pleural effusion, or pneumothorax. Electronically signed by: Greig Pique MD 06/09/2024 11:33 PM EST RP Workstation: [id number]    Microbiology No results found for this or any previous visit (from the past 240 hours).  Lab Basic Metabolic Panel: No results for input(s): NA, K, CL, CO2, GLUCOSE, BUN, CREATININE, CALCIUM, MG, PHOS in the last 168 hours. Liver Function Tests: No results for input(s): AST, ALT, ALKPHOS, BILITOT, PROT, ALBUMIN in the last 168 hours. No results for input(s): LIPASE, AMYLASE in the last 168 hours. No results for input(s): AMMONIA in the last 168 hours. CBC: No results for input(s): WBC, NEUTROABS, HGB, HCT, MCV, PLT in the last 168 hours. Cardiac Enzymes: No results for input(s): CKTOTAL, CKMB, CKMBINDEX, TROPONINI in the last 168 hours. Sepsis Labs: No results for input(s): PROCALCITON, WBC, LATICACIDVEN in the last 168 hours.  Joy Ball 07/05/2024, 10:32 AM   "

## 2024-07-24 NOTE — Progress Notes (Signed)
 Please see IPAL. Family returned to us  that they would like to proceed to CMO.  Orders were updated.   Darrin Barn, MD [facility] 2024-07-25 4:23 PM

## 2024-07-24 NOTE — Progress Notes (Signed)
 Pt being proned at this time, ETAD removed and pt's ETTube taped with twill tape at 23 in center of mouth. No complications with prone process and vent settings remained the same.

## 2024-07-24 NOTE — Progress Notes (Signed)
" °  Echocardiogram 2D Echocardiogram has been performed.  Joy Ball 06-30-24, 11:09 AM "

## 2024-07-24 NOTE — Progress Notes (Signed)
 PCCM INTERVAL PROGRESS NOTE    Called regarding desaturations. Sats in the mid 80s despite 80% FiO2 and 5 PEEP. Increasing PEEP limited by peak and plateau pressures. PEEP increased to 10. Peak and plat remained stable at 38 and 35 respectively. Vt dropped to 6cc/kg ( ) and rate increased.   Vent Mode: PRVC FiO2 (%):  [40 %-100 %] 100 % Set Rate:  [24 bmp-28 bmp] 24 bmp Vt Set:  [350 mL] 350 mL PEEP:  [5 cmH20-10 cmH20] 5 cmH20 Pressure Support:  [4 cmH20] 4 cmH20 Plateau Pressure:  [32 cmH20] 32 cmH20    Despite deep sedation, push dose paralytic, and increasing vent settings, she remains hypoxic related to ARDS, IPF flare, rule out HCAP and rule out pule edema  Plan: - Will plan to prone per PCCM protocol 16 hours prone 8 hours supine - Give 1 gram solumedrol for pulse dosing x 3 days then 1mg /kg daily thereafter.  - Add vasopressin  - CXR and ABG post prone positioning.    Deward Eastern, AGACNP-BC [facility]  See Amion for personal pager PCCM on call pager [phone] until 7pm. Please call Elink 7p-7a. [phone]  07-10-24 12:08 PM

## 2024-07-24 NOTE — Progress Notes (Signed)
" °   2024/06/30 1700  Spiritual Encounters  Type of Visit Initial  Care provided to: Pt and family  Conversation partners present during encounter Nurse  Referral source Family  Reason for visit End-of-life  OnCall Visit Yes  Spiritual Framework  Presenting Themes Goals in life/care  Interventions  Spiritual Care Interventions Made Established relationship of care and support;Compassionate presence;Reflective listening;Normalization of emotions;Decision-making support/facilitation;Supported grief process  Intervention Outcomes  Outcomes Patient family open to resources    Chaplain responded to EOL page. Chaplain supported son and daughter with moving to comfort care measures. Chaplain provided compassionate presence and reflective listening as family spoke about patient's health journey. Chaplain supported the grief process and normalized a variety of emotions.Chaplain is available for follow up as needed. "

## 2024-07-24 NOTE — Progress Notes (Signed)
 When the patient is deceased, the family has elected to utilize [employer] in [place], [place].  They can be reached at [phone].

## 2024-07-24 NOTE — Progress Notes (Signed)
 "  NAME:  Joy Ball, MRN:  [id number], DOB:  09/21/1965, LOS: 1 ADMISSION DATE:  06/24/2024, CONSULTATION DATE:  06/25/2023 REFERRING MD:  Dr. Cyrena, CHIEF COMPLAINT:  Shortness of breath, hypoxia   Brief Pt Description / Synopsis:  59 y.o female with PMHx significant for COPD, ILD, and lung cancer admitted with Acute Hypoxic Respiratory Failure due to Acute COPD Exacerbation, questionable ILD Flare, and questionable HCAP requiring BiPAP.  High risk for intubation.   History of Present Illness:  Joy Ball is a 59 y.o. female with a past medical history significant for COPD, ILD/pulmonary fibrosis, lung cancer status post radiation, and recent hospitalization for pneumonia who presents to [facility] ED on 06/25/23 with shortness of breath, chest tightness, and nonproductive cough.   She reports that her shortness of breath began yesterday, and has progressively worsened today.  At home she checked her oxygen  saturations was noted to be hypoxic with O2 saturations in the 70's.  She denies palpitations, fever, chills, dysuria, nausea, vomiting, diarrhea, abdominal pain, or any known sick contacts. Given her work of breathing, she was placed on BiPAP on arrival to the ED.  Of note she was recently admitted to hospital and discharged just prior to Christmas for Acute on chronic hypoxic respiratory failure due to community acquired pneumonia and COPD exacerbation.   ED Course: Initial Vital Signs: Temperature 98.5 F, pulse 109, RR 36, BP 110/73, SpO2 85% Significant Labs: BNP 338, HS troponin <15, lactic acid 2.1, procalcitonin 0.22, WBC 17.8 VBG on BiPAP:  pH 7.37/ pCO2 45/ pO2 46/Bicarb 26 Imaging Chest X-ray>>Emphysema. Unchanged, confluent peripheral pleuroparenchymal scarring with architectural distortion and volume loss. Extensive mid and lower lung zone predominant reticular interstitial opacities compatible with pulmonary fibrosis. No pleural effusion. No pneumothorax. Medications Administered:  500cc LR bolus, IV Levaquin , duonebs, 125 mg Solumedrol  She met Sepsis criteria, however decision made to start with a small fluid bolus given no evidence of hypoperfusion will defer the full 30 cc/kg ideal body weight fluid bolus as there was some consideration for CHF/pulmonary edema on her last presentation   PCCM asked to admit for further workup and treatment.  Please see Significant Hospital Events section below for full detailed hospital course.   Pertinent  Medical History   Past Medical History:  Diagnosis Date   Allergy Idk   Arthritis Idk   Asthma    Cancer (HCC)    Cataract Idk   COPD (chronic obstructive pulmonary disease) (HCC)    Dyspnea    Emphysema lung (HCC)    Emphysema of lung (HCC) Idk   GERD (gastroesophageal reflux disease)    Lung cancer (HCC) 2024   Lung nodule    Migraines    Pulmonary fibrosis (HCC)    Seasonal allergies     Micro Data:  1/2: COVID/FLU/RSV PCR > negative 1/2: Blood cultures >> 1/2: Respiratory Viral Panel > negative 1/2: Strep Pneumo urinary antigen> negative 1/2: Legionella urinary antigen>> 1/2: MRSA PCR > negative  Antimicrobials:   Anti-infectives (From admission, onward)    Start     Dose/Rate Route Frequency Ordered Stop   June 30, 2024 0900  vancomycin  (VANCOCIN ) IVPB 1000 mg/200 mL premix  Status:  Discontinued        1,000 mg 200 mL/hr over 60 Minutes Intravenous Every 24 hours 06/24/24 0818 06/24/24 1009   June 30, 2024 0630  azithromycin  (ZITHROMAX ) 500 mg in sodium chloride  0.9 % 250 mL IVPB        500 mg 250 mL/hr over 60  Minutes Intravenous Every 24 hours 06/24/24 0755 06/27/24 0629   06/24/24 0900  vancomycin  (VANCOREADY) IVPB 1250 mg/250 mL  Status:  Discontinued        1,250 mg 166.7 mL/hr over 90 Minutes Intravenous  Once 06/24/24 0814 06/24/24 0814   06/24/24 0900  vancomycin  (VANCOCIN ) IVPB 1000 mg/200 mL premix  Status:  Discontinued        1,000 mg 200 mL/hr over 60 Minutes Intravenous  Once 06/24/24 0814  06/24/24 1009   06/24/24 0830  ceFEPIme  (MAXIPIME ) 2 g in sodium chloride  0.9 % 100 mL IVPB        2 g 200 mL/hr over 30 Minutes Intravenous Every 12 hours 06/24/24 0812     06/24/24 0600  levofloxacin  (LEVAQUIN ) IVPB 750 mg        750 mg 100 mL/hr over 90 Minutes Intravenous  Once 06/24/24 0552 06/24/24 0802       Significant Hospital Events: Including procedures, antibiotic start and stop dates in addition to other pertinent events   06/25/2023:  presents to ED with hypoxia and acute respiratory distress, placed on BiPAP. PCCM asked to admit, HIGH RISK FOR INTUBATION   Interim History / Subjective:   Significantly worse overnight. Intubated yesterday evening with great difficulty meeting her ventilatory and oxygenation goals. Sedation increased overnight. Now on pressors. High peak and plateau pressures. Difficult to manage.   Objective   Blood pressure (!) 88/57, pulse (!) 102, temperature 99.4 F (37.4 C), temperature source Oral, resp. rate (!) 24, height 5' (1.524 m), weight 51.7 kg, SpO2 95%.    Vent Mode: PRVC FiO2 (%):  [40 %-100 %] 100 % Set Rate:  [24 bmp-28 bmp] 24 bmp Vt Set:  [350 mL] 350 mL PEEP:  [5 cmH20-10 cmH20] 5 cmH20 Pressure Support:  [4 cmH20] 4 cmH20 Plateau Pressure:  [32 cmH20] 32 cmH20   Intake/Output Summary (Last 24 hours) at 06-28-2024 0806 Last data filed at 28-Jun-2024 0700 Gross per 24 hour  Intake 930.91 ml  Output 1375 ml  Net -444.09 ml   Filed Weights   06/24/24 0553 06/24/24 0920 06-28-2024 0449  Weight: 52.4 kg 52.1 kg 51.7 kg    Examination: General: Middle aged female on vent HENT: [place]/AT, PERRL, no JVD Lungs: Coarse bilaterally. Diminished bases.  Cardiovascular: RRR, no MRG Abdomen: Soft, NT, ND Extremities: No acute deformity or edema Neuro: Sedated RASS -3  CTA chest: no PE, extensive GGO bilaterallaly worse since study a few weeks prior. Possible PNA. Emphysema.   Resolved Hospital Problem list     Assessment & Plan:    ARDS Likely ILD flare Rule out HCAP Cannot rule out a component of pulmonary edema COPD without acute exacerbation Viral panel negative PE ruled out by CTA - Full vent support - Decrease Vt to 6cc/kg ( ) and increase rate to match minute ventilation.  - Monitor closely for autoPEEP - Keep Plats < 30 - Precedex  and Fentanyl  infusions for RASS goal -4 to -5. May need to escalte.  - Did receive push dose vecuronium  overnight. Synchronous right now.  - Place arterial line, intermittent ABG - Will consider prone positioning.  - Empiric Cefepime , azithromycin  - Cultures pending - Duonebs scheduled - Steroids - consider pulse dose after infection ruled out - Aspergillus antigen, fungitell beta glucan, ESR, CRP pending - Diuresis as tolerated. 20mg  given this morning - If this is truly ILD flare there may not be much we can do. I have discussed this with family. The patient had a  DNR in the past.   Shock: septic vs sedation related. Favor the latter.  - NE infusion for MAP goal 65 mmHg - Echocardiogram pending - Place central line as pressor requirments are escalating and she will likely need deeper sedation.  History of Adenocarcinoma right lung s/p SBRT April 2025. - supportive care  Anemia: hemoglobin 10. Chronic macrocytosis secondary to folate deficiency.  - Add folate - Trend H&H     Best Practice (right click and Reselect all SmartList Selections daily)   Diet/type: NPO until respiratory status improved  DVT prophylaxis: LMWH GI prophylaxis: N/A Lines: N/A Foley:  N/A Code Status:  full code Last date of multidisciplinary goals of care discussion [N/A]  1/3: Daughter updated at bedside and son updated via phone. Aware this may not be survivable. Pursuing aggressive management for now.   Labs   CBC: Recent Labs  Lab 06/24/24 0557 01-Jul-2024 0650  WBC 17.8* 25.0*  NEUTROABS 15.4*  --   HGB 12.2 10.3*  HCT 39.0 33.6*  MCV 99.5 100.6*  PLT 390 481*     Basic Metabolic Panel: Recent Labs  Lab 06/24/24 0557 07/01/2024 0650  NA 140 137  K 3.7 4.4  CL 105 103  CO2 23 26  GLUCOSE 88 122*  BUN 18 17  CREATININE 0.69 0.56  CALCIUM 9.7 9.2  MG  --  2.1  PHOS  --  4.4   GFR: Estimated Creatinine Clearance: 55.7 mL/min (by C-G formula based on SCr of 0.56 mg/dL). Recent Labs  Lab 06/24/24 0557 06/24/24 0800 06/24/24 0820 Jul 01, 2024 0650  PROCALCITON  --   --  0.22  --   WBC 17.8*  --   --  25.0*  LATICACIDVEN 2.1* 2.2*  --   --     Liver Function Tests: Recent Labs  Lab 06/24/24 0557 July 01, 2024 0650  AST 29  --   ALT 11  --   ALKPHOS 183*  --   BILITOT 0.4  --   PROT 6.7  --   ALBUMIN 3.6 3.1*   No results for input(s): LIPASE, AMYLASE in the last 168 hours. No results for input(s): AMMONIA in the last 168 hours.  ABG    Component Value Date/Time   PHART 7.21 (L) 07-01-2024 0615   PCO2ART 69 (HH) 07-01-24 0615   PO2ART 69 (L) 2024/07/01 0615   HCO3 27.6 07/01/2024 0615   TCO2 25 04/26/2024 1010   ACIDBASEDEF 1.9 July 01, 2024 0615   O2SAT 90.9 July 01, 2024 0615     Coagulation Profile: Recent Labs  Lab 06/24/24 0557  INR 1.0    Cardiac Enzymes: No results for input(s): CKTOTAL, CKMB, CKMBINDEX, TROPONINI in the last 168 hours.  HbA1C: Hgb A1c MFr Bld  Date/Time Value Ref Range Status  06/10/2024 05:14 PM 5.1 4.8 - 5.6 % Final    Comment:    (NOTE) Diagnosis of Diabetes The following HbA1c ranges recommended by the American Diabetes Association (ADA) may be used as an aid in the diagnosis of diabetes mellitus.  Hemoglobin             Suggested A1C NGSP%              Diagnosis  <5.7                   Non Diabetic  5.7-6.4                Pre-Diabetic  >6.4  Diabetic  <7.0                   Glycemic control for                       adults with diabetes.    11/20/2022 09:33 AM 5.0 <5.7 % of total Hgb Final    Comment:    For the purpose of screening for the  presence of diabetes: . <5.7%       Consistent with the absence of diabetes 5.7-6.4%    Consistent with increased risk for diabetes             (prediabetes) > or =6.5%  Consistent with diabetes . This assay result is consistent with a decreased risk of diabetes. . Currently, no consensus exists regarding use of hemoglobin A1c for diagnosis of diabetes in children. . According to American Diabetes Association (ADA) guidelines, hemoglobin A1c <7.0% represents optimal control in non-pregnant diabetic patients. Different metrics may apply to specific patient populations.  Standards of Medical Care in Diabetes(ADA). .     CBG: Recent Labs  Lab 06/24/24 1625 06/24/24 1944 06/24/24 2336 07-22-2024 0431 07/22/24 0729  GLUCAP 136* 154* 121* 107* 131*     Past Medical History:  She,  has a past medical history of Allergy (Idk), Arthritis (Idk), Asthma, Cancer (HCC), Cataract (Idk), COPD (chronic obstructive pulmonary disease) (HCC), Dyspnea, Emphysema lung (HCC), Emphysema of lung (HCC) (Idk), GERD (gastroesophageal reflux disease), Lung cancer (HCC) (2024), Lung nodule, Migraines, Pulmonary fibrosis (HCC), and Seasonal allergies.   Surgical History:   Past Surgical History:  Procedure Laterality Date   CESAREAN SECTION     EYE SURGERY Bilateral 01/2023   RIGHT HEART CATH Right 04/26/2024   Procedure: RIGHT HEART CATH;  Surgeon: Mady Bruckner, MD;  Location: [facility] INVASIVE CV LAB;  Service: Cardiovascular;  Laterality: Right;   TUBAL LIGATION       Social History:   reports that she quit smoking about 16 months ago. Her smoking use included cigarettes. She started smoking about 41 years ago. She has a 121.3 pack-year smoking history. She has never used smokeless tobacco. She reports current alcohol  use. She reports that she does not use drugs.   Family History:  Her family history includes Arthritis in her mother; Asthma in her mother; COPD in her mother; Cancer in her  father; Emphysema in her mother; Heart disease in her maternal grandfather; Lung cancer in her father; Miscarriages / Stillbirths in her sister and sister; Varicose Veins in her mother; Vision loss in her mother.   Allergies Allergies[1]   Home Medications  Prior to Admission medications  Medication Sig Start Date End Date Taking? Authorizing Provider  acetaminophen  (TYLENOL ) 500 MG tablet Take 1,500 mg by mouth 3 (three) times daily. 2 to 3 times a day.    [provider]  aspirin  EC 81 MG tablet Take 1 tablet (81 mg total) by mouth daily. Swallow whole. 03/03/24   Darliss Rogue, MD  budesonide -formoterol  (SYMBICORT ) 160-4.5 MCG/ACT inhaler Inhale 2 puffs into the lungs daily. 08/26/23   Isadora Hose, MD  buPROPion  (WELLBUTRIN  SR) 150 MG 12 hr tablet TAKE 1 TABLET(150 MG) BY MOUTH TWICE DAILY 06/22/24   Edman, Marsa PARAS, DO  fluticasone  (FLONASE ) 50 MCG/ACT nasal spray Place 2 sprays into both nostrils daily. Patient taking differently: Place 2 sprays into both nostrils daily as needed for allergies. 03/30/22   Gladis Elsie BROCKS, PA-C  gabapentin  (NEURONTIN ) 300 MG  capsule TAKE 1 CAPSULE(300 MG) BY MOUTH AT BEDTIME 05/18/24   Karamalegos, Alexander J, DO  Dextromethorphan -guaiFENesin  20-200 MG/20ML LIQD Take 20 mLs by mouth every 4 (four) hours as needed for cough. 06/15/24   Patel, Sona, MD  ipratropium (ATROVENT ) 0.06 % nasal spray Place 2 sprays into both nostrils 4 (four) times daily as needed (allergies).    [provider]  ipratropium-albuterol  (DUONEB) 0.5-2.5 (3) MG/3ML SOLN Take 3 mLs by nebulization 2 (two) times daily as needed (breathing). 06/15/24   Patel, Sona, MD  loratadine  (CLARITIN ) 10 MG tablet Take 10 mg by mouth daily.    [provider]  naproxen  (NAPROSYN ) 500 MG tablet TAKE 1 TABLET(500 MG) BY MOUTH TWICE DAILY WITH A MEAL 04/05/24   Karamalegos, Marsa PARAS, DO  nerandomilast  ([name] ) 18 MG tablet Take 18 mg by mouth in the morning  and at bedtime. 06/21/24   Jude Harden GAILS, MD  nystatin  (MYCOSTATIN ) 100000 UNIT/ML suspension SHAKE LIQUID AND TAKE 5 ML BY MOUTH FOUR TIMES DAILY Patient taking differently: Take 5 mLs by mouth 4 (four) times daily as needed (thrush). 10/27/23   Karamalegos, Marsa PARAS, DO  omeprazole  (PRILOSEC) 40 MG capsule Take 1 capsule (40 mg total) by mouth in the morning and at bedtime. 03/07/24   Cobb, Comer GAILS, NP  Tiotropium Bromide  Monohydrate (SPIRIVA  RESPIMAT) 2.5 MCG/ACT AERS Inhale 2 puffs into the lungs daily. 08/26/23   Isadora Hose, MD  VENTOLIN  HFA 108 (90 Base) MCG/ACT inhaler INHALE 2 PUFFS INTO THE LUNGS EVERY 6 HOURS AS NEEDED FOR WHEEZING OR SHORTNESS OF BREATH 06/22/24   Edman Marsa PARAS, DO     Critical care time: 53 minutes      Deward Eastern, AGACNP-BC [facility]  See Amion for personal pager PCCM on call pager [phone] until 7pm. Please call Elink 7p-7a. [phone]  07-06-2024 8:31 AM            [1]  Allergies Allergen Reactions   Amoxicillin Hives   "

## 2024-07-24 NOTE — Procedures (Signed)
 Arterial Catheter Insertion Procedure Note  Joy Ball  [id number]  11-11-1965  Date:July 15, 2024  Time:9:38 AM    Provider Performing: Deward LELON Eastern    Procedure: Insertion of Arterial Line (63379) with US  guidance (23062)   Indication(s) Blood pressure monitoring and/or need for frequent ABGs  Consent Risks of the procedure as well as the alternatives and risks of each were explained to the patient and/or caregiver.  Consent for the procedure was obtained and is signed in the bedside chart  Anesthesia None   Time Out Verified patient identification, verified procedure, site/side was marked, verified correct patient position, special equipment/implants available, medications/allergies/relevant history reviewed, required imaging and test results available.   Sterile Technique Maximal sterile technique including full sterile barrier drape, hand hygiene, sterile gown, sterile gloves, mask, hair covering, sterile ultrasound probe cover (if used).   Procedure Description Area of catheter insertion was cleaned with chlorhexidine  and draped in sterile fashion. With real-time ultrasound guidance an arterial catheter was placed into the left radial artery.  Appropriate arterial tracings confirmed on monitor.     Complications/Tolerance None; patient tolerated the procedure well.   EBL Minimal   Specimen(s) None   Deward Eastern, AGACNP-BC [facility]  See Amion for personal pager PCCM on call pager [phone] until 7pm. Please call Elink 7p-7a. [phone]  07-15-24 9:38 AM

## 2024-07-24 DEATH — deceased

## 2024-08-18 ENCOUNTER — Other Ambulatory Visit

## 2024-09-01 ENCOUNTER — Ambulatory Visit: Admitting: Radiation Oncology

## 2024-11-09 ENCOUNTER — Inpatient Hospital Stay: Admitting: Oncology

## 2024-11-09 ENCOUNTER — Inpatient Hospital Stay
